# Patient Record
Sex: Female | Born: 1940 | Race: White | Hispanic: No | Smoking: Former smoker
Health system: Southern US, Community
[De-identification: ages and names within clinical notes are randomized; demographics above are authoritative.]

## PROBLEM LIST (undated history)

## (undated) DIAGNOSIS — M779 Enthesopathy, unspecified: Secondary | ICD-10-CM

## (undated) DIAGNOSIS — Z8674 Personal history of sudden cardiac arrest: Secondary | ICD-10-CM

## (undated) DIAGNOSIS — IMO0002 Reserved for concepts with insufficient information to code with codable children: Secondary | ICD-10-CM

## (undated) DIAGNOSIS — T4145XA Adverse effect of unspecified anesthetic, initial encounter: Secondary | ICD-10-CM

## (undated) DIAGNOSIS — I2510 Atherosclerotic heart disease of native coronary artery without angina pectoris: Secondary | ICD-10-CM

## (undated) DIAGNOSIS — J40 Bronchitis, not specified as acute or chronic: Secondary | ICD-10-CM

## (undated) DIAGNOSIS — Z923 Personal history of irradiation: Secondary | ICD-10-CM

## (undated) DIAGNOSIS — C50919 Malignant neoplasm of unspecified site of unspecified female breast: Secondary | ICD-10-CM

## (undated) DIAGNOSIS — T8859XA Other complications of anesthesia, initial encounter: Secondary | ICD-10-CM

## (undated) DIAGNOSIS — I219 Acute myocardial infarction, unspecified: Secondary | ICD-10-CM

## (undated) DIAGNOSIS — R911 Solitary pulmonary nodule: Secondary | ICD-10-CM

## (undated) DIAGNOSIS — M549 Dorsalgia, unspecified: Secondary | ICD-10-CM

## (undated) DIAGNOSIS — I10 Essential (primary) hypertension: Secondary | ICD-10-CM

## (undated) DIAGNOSIS — H269 Unspecified cataract: Secondary | ICD-10-CM

## (undated) DIAGNOSIS — M1990 Unspecified osteoarthritis, unspecified site: Secondary | ICD-10-CM

## (undated) DIAGNOSIS — L719 Rosacea, unspecified: Secondary | ICD-10-CM

## (undated) DIAGNOSIS — R0600 Dyspnea, unspecified: Secondary | ICD-10-CM

## (undated) DIAGNOSIS — F419 Anxiety disorder, unspecified: Secondary | ICD-10-CM

## (undated) DIAGNOSIS — R0609 Other forms of dyspnea: Secondary | ICD-10-CM

## (undated) DIAGNOSIS — IMO0001 Reserved for inherently not codable concepts without codable children: Secondary | ICD-10-CM

## (undated) DIAGNOSIS — E785 Hyperlipidemia, unspecified: Secondary | ICD-10-CM

## (undated) HISTORY — DX: Hyperlipidemia, unspecified: E78.5

## (undated) HISTORY — DX: Reserved for inherently not codable concepts without codable children: IMO0001

## (undated) HISTORY — PX: EYE SURGERY: SHX253

## (undated) HISTORY — PX: TONSILLECTOMY AND ADENOIDECTOMY: SUR1326

## (undated) HISTORY — PX: CATARACT EXTRACTION: SUR2

## (undated) HISTORY — PX: OTHER SURGICAL HISTORY: SHX169

## (undated) HISTORY — PX: SPINAL FUSION: SHX223

## (undated) HISTORY — DX: Malignant neoplasm of unspecified site of unspecified female breast: C50.919

## (undated) HISTORY — DX: Enthesopathy, unspecified: M77.9

## (undated) HISTORY — PX: BACK SURGERY: SHX140

## (undated) HISTORY — DX: Atherosclerotic heart disease of native coronary artery without angina pectoris: I25.10

## (undated) HISTORY — DX: Personal history of sudden cardiac arrest: Z86.74

## (undated) HISTORY — PX: TUBAL LIGATION: SHX77

## (undated) HISTORY — DX: Reserved for concepts with insufficient information to code with codable children: IMO0002

## (undated) HISTORY — DX: Acute myocardial infarction, unspecified: I21.9

## (undated) HISTORY — DX: Rosacea, unspecified: L71.9

## (undated) HISTORY — DX: Unspecified cataract: H26.9

---

## 1998-05-30 ENCOUNTER — Other Ambulatory Visit: Admission: RE | Admit: 1998-05-30 | Discharge: 1998-05-30 | Payer: Self-pay | Admitting: *Deleted

## 1998-09-28 ENCOUNTER — Other Ambulatory Visit: Admission: RE | Admit: 1998-09-28 | Discharge: 1998-09-28 | Payer: Self-pay | Admitting: *Deleted

## 1999-07-12 ENCOUNTER — Other Ambulatory Visit: Admission: RE | Admit: 1999-07-12 | Discharge: 1999-07-12 | Payer: Self-pay | Admitting: *Deleted

## 1999-12-29 ENCOUNTER — Encounter: Payer: Self-pay | Admitting: Specialist

## 2000-01-01 ENCOUNTER — Ambulatory Visit (HOSPITAL_COMMUNITY): Admission: RE | Admit: 2000-01-01 | Discharge: 2000-01-01 | Payer: Self-pay | Admitting: Specialist

## 2000-01-31 ENCOUNTER — Ambulatory Visit (HOSPITAL_COMMUNITY): Admission: RE | Admit: 2000-01-31 | Discharge: 2000-01-31 | Payer: Self-pay | Admitting: Specialist

## 2000-04-05 ENCOUNTER — Encounter: Admission: RE | Admit: 2000-04-05 | Discharge: 2000-04-05 | Payer: Self-pay | Admitting: *Deleted

## 2000-04-05 ENCOUNTER — Encounter: Payer: Self-pay | Admitting: *Deleted

## 2000-09-20 ENCOUNTER — Other Ambulatory Visit: Admission: RE | Admit: 2000-09-20 | Discharge: 2000-09-20 | Payer: Self-pay | Admitting: *Deleted

## 2001-09-24 ENCOUNTER — Encounter: Payer: Self-pay | Admitting: Internal Medicine

## 2001-09-24 ENCOUNTER — Encounter: Admission: RE | Admit: 2001-09-24 | Discharge: 2001-09-24 | Payer: Self-pay | Admitting: Internal Medicine

## 2001-12-08 ENCOUNTER — Other Ambulatory Visit: Admission: RE | Admit: 2001-12-08 | Discharge: 2001-12-08 | Payer: Self-pay | Admitting: *Deleted

## 2003-02-23 ENCOUNTER — Other Ambulatory Visit: Admission: RE | Admit: 2003-02-23 | Discharge: 2003-02-23 | Payer: Self-pay | Admitting: *Deleted

## 2003-07-20 ENCOUNTER — Encounter: Admission: RE | Admit: 2003-07-20 | Discharge: 2003-07-20 | Payer: Self-pay | Admitting: *Deleted

## 2004-03-29 ENCOUNTER — Other Ambulatory Visit: Admission: RE | Admit: 2004-03-29 | Discharge: 2004-03-29 | Payer: Self-pay | Admitting: *Deleted

## 2004-05-02 ENCOUNTER — Ambulatory Visit (HOSPITAL_COMMUNITY): Admission: RE | Admit: 2004-05-02 | Discharge: 2004-05-02 | Payer: Self-pay | Admitting: Cardiothoracic Surgery

## 2004-09-05 ENCOUNTER — Encounter: Admission: RE | Admit: 2004-09-05 | Discharge: 2004-09-05 | Payer: Self-pay | Admitting: *Deleted

## 2004-09-27 ENCOUNTER — Ambulatory Visit (HOSPITAL_COMMUNITY): Admission: RE | Admit: 2004-09-27 | Discharge: 2004-09-27 | Payer: Self-pay | Admitting: Cardiothoracic Surgery

## 2005-05-28 ENCOUNTER — Other Ambulatory Visit: Admission: RE | Admit: 2005-05-28 | Discharge: 2005-05-28 | Payer: Self-pay | Admitting: Obstetrics & Gynecology

## 2005-06-21 ENCOUNTER — Encounter: Admission: RE | Admit: 2005-06-21 | Discharge: 2005-06-21 | Payer: Self-pay | Admitting: Cardiothoracic Surgery

## 2005-09-11 ENCOUNTER — Encounter: Admission: RE | Admit: 2005-09-11 | Discharge: 2005-09-11 | Payer: Self-pay | Admitting: Internal Medicine

## 2006-02-26 DIAGNOSIS — L719 Rosacea, unspecified: Secondary | ICD-10-CM

## 2006-02-26 HISTORY — DX: Rosacea, unspecified: L71.9

## 2006-09-19 ENCOUNTER — Other Ambulatory Visit: Admission: RE | Admit: 2006-09-19 | Discharge: 2006-09-19 | Payer: Self-pay | Admitting: Obstetrics & Gynecology

## 2007-01-16 ENCOUNTER — Encounter: Admission: RE | Admit: 2007-01-16 | Discharge: 2007-01-16 | Payer: Self-pay | Admitting: Internal Medicine

## 2007-01-21 ENCOUNTER — Encounter: Admission: RE | Admit: 2007-01-21 | Discharge: 2007-01-21 | Payer: Self-pay | Admitting: Internal Medicine

## 2007-02-04 ENCOUNTER — Ambulatory Visit: Payer: Self-pay | Admitting: Internal Medicine

## 2007-02-12 ENCOUNTER — Ambulatory Visit: Payer: Self-pay | Admitting: Internal Medicine

## 2007-02-27 DIAGNOSIS — M779 Enthesopathy, unspecified: Secondary | ICD-10-CM

## 2007-02-27 HISTORY — DX: Enthesopathy, unspecified: M77.9

## 2007-03-19 ENCOUNTER — Encounter: Admission: RE | Admit: 2007-03-19 | Discharge: 2007-03-19 | Payer: Self-pay | Admitting: Internal Medicine

## 2008-02-23 ENCOUNTER — Encounter: Admission: RE | Admit: 2008-02-23 | Discharge: 2008-02-23 | Payer: Self-pay | Admitting: Internal Medicine

## 2008-03-04 ENCOUNTER — Encounter: Admission: RE | Admit: 2008-03-04 | Discharge: 2008-03-04 | Payer: Self-pay | Admitting: Internal Medicine

## 2008-09-26 HISTORY — PX: BREAST BIOPSY: SHX20

## 2008-10-25 ENCOUNTER — Encounter: Admission: RE | Admit: 2008-10-25 | Discharge: 2008-10-25 | Payer: Self-pay | Admitting: Internal Medicine

## 2008-10-27 HISTORY — PX: HYSTEROSCOPY: SHX211

## 2008-11-02 ENCOUNTER — Ambulatory Visit (HOSPITAL_BASED_OUTPATIENT_CLINIC_OR_DEPARTMENT_OTHER): Admission: RE | Admit: 2008-11-02 | Discharge: 2008-11-02 | Payer: Self-pay | Admitting: Obstetrics & Gynecology

## 2008-11-02 ENCOUNTER — Encounter: Payer: Self-pay | Admitting: Obstetrics & Gynecology

## 2009-04-29 ENCOUNTER — Encounter: Admission: RE | Admit: 2009-04-29 | Discharge: 2009-04-29 | Payer: Self-pay | Admitting: Internal Medicine

## 2009-10-23 ENCOUNTER — Emergency Department (HOSPITAL_COMMUNITY): Admission: EM | Admit: 2009-10-23 | Discharge: 2009-10-23 | Payer: Self-pay | Admitting: Emergency Medicine

## 2010-03-19 ENCOUNTER — Encounter: Payer: Self-pay | Admitting: Internal Medicine

## 2010-04-21 ENCOUNTER — Other Ambulatory Visit: Payer: Self-pay | Admitting: Obstetrics & Gynecology

## 2010-04-21 DIAGNOSIS — Z1231 Encounter for screening mammogram for malignant neoplasm of breast: Secondary | ICD-10-CM

## 2010-05-30 ENCOUNTER — Ambulatory Visit
Admission: RE | Admit: 2010-05-30 | Discharge: 2010-05-30 | Disposition: A | Discharge status: Home / Self Care | Payer: Medicare Other | Source: Ambulatory Visit | Attending: Obstetrics & Gynecology | Admitting: Obstetrics & Gynecology

## 2010-05-30 DIAGNOSIS — Z1231 Encounter for screening mammogram for malignant neoplasm of breast: Secondary | ICD-10-CM

## 2010-06-02 LAB — BASIC METABOLIC PANEL
BUN: 15 mg/dL (ref 6–23)
CO2: 31 mEq/L (ref 19–32)
Calcium: 9.3 mg/dL (ref 8.4–10.5)
GFR calc Af Amer: >60 mL/min (ref >60)
GFR calc non Af Amer: >60 mL/min (ref >60)
Sodium: 139 mEq/L (ref 135–145)

## 2010-06-02 LAB — CBC
HCT: 40.7 % (ref 36.0–46.0)
MCV: 87.2 fL (ref 78.0–100.0)
RBC: 4.67 MIL/uL (ref 3.87–5.11)
RDW: 13.7 % (ref 11.5–15.5)

## 2010-06-02 LAB — URINALYSIS, ROUTINE W REFLEX MICROSCOPIC
Bilirubin Urine: NEGATIVE
Glucose, UA: NEGATIVE mg/dL

## 2010-06-02 LAB — URINE MICROSCOPIC-ADD ON

## 2010-12-28 DIAGNOSIS — Z8674 Personal history of sudden cardiac arrest: Secondary | ICD-10-CM

## 2010-12-28 DIAGNOSIS — I219 Acute myocardial infarction, unspecified: Secondary | ICD-10-CM

## 2010-12-28 HISTORY — DX: Acute myocardial infarction, unspecified: I21.9

## 2010-12-28 HISTORY — DX: Personal history of sudden cardiac arrest: Z86.74

## 2011-01-18 ENCOUNTER — Inpatient Hospital Stay (HOSPITAL_COMMUNITY): Payer: Medicare Other

## 2011-01-18 ENCOUNTER — Inpatient Hospital Stay (HOSPITAL_COMMUNITY)
Admission: EM | Admit: 2011-01-18 | Discharge: 2011-01-31 | DRG: 250 | Disposition: A | Discharge status: Skilled Nursing Facility | Payer: Medicare Other | Source: Ambulatory Visit | Attending: Cardiology | Admitting: Cardiology

## 2011-01-18 ENCOUNTER — Emergency Department (HOSPITAL_COMMUNITY): Payer: Medicare Other

## 2011-01-18 ENCOUNTER — Encounter: Payer: Self-pay | Admitting: *Deleted

## 2011-01-18 ENCOUNTER — Other Ambulatory Visit: Payer: Self-pay

## 2011-01-18 ENCOUNTER — Encounter (HOSPITAL_COMMUNITY)
Admission: EM | Disposition: A | Discharge status: Skilled Nursing Facility | Payer: Self-pay | Source: Ambulatory Visit | Attending: Cardiology

## 2011-01-18 ENCOUNTER — Ambulatory Visit (HOSPITAL_COMMUNITY): Admit: 2011-01-18 | Payer: Self-pay | Admitting: Cardiology

## 2011-01-18 DIAGNOSIS — I519 Heart disease, unspecified: Secondary | ICD-10-CM | POA: Diagnosis not present

## 2011-01-18 DIAGNOSIS — F411 Generalized anxiety disorder: Secondary | ICD-10-CM | POA: Diagnosis present

## 2011-01-18 DIAGNOSIS — R0902 Hypoxemia: Secondary | ICD-10-CM

## 2011-01-18 DIAGNOSIS — I2510 Atherosclerotic heart disease of native coronary artery without angina pectoris: Secondary | ICD-10-CM | POA: Diagnosis present

## 2011-01-18 DIAGNOSIS — Y840 Cardiac catheterization as the cause of abnormal reaction of the patient, or of later complication, without mention of misadventure at the time of the procedure: Secondary | ICD-10-CM | POA: Diagnosis not present

## 2011-01-18 DIAGNOSIS — Z87891 Personal history of nicotine dependence: Secondary | ICD-10-CM

## 2011-01-18 DIAGNOSIS — E872 Acidosis, unspecified: Secondary | ICD-10-CM | POA: Diagnosis present

## 2011-01-18 DIAGNOSIS — J9600 Acute respiratory failure, unspecified whether with hypoxia or hypercapnia: Secondary | ICD-10-CM

## 2011-01-18 DIAGNOSIS — I959 Hypotension, unspecified: Secondary | ICD-10-CM

## 2011-01-18 DIAGNOSIS — R680 Hypothermia, not associated with low environmental temperature: Secondary | ICD-10-CM | POA: Diagnosis present

## 2011-01-18 DIAGNOSIS — I213 ST elevation (STEMI) myocardial infarction of unspecified site: Secondary | ICD-10-CM

## 2011-01-18 DIAGNOSIS — E7800 Pure hypercholesterolemia, unspecified: Secondary | ICD-10-CM | POA: Diagnosis present

## 2011-01-18 DIAGNOSIS — I2119 ST elevation (STEMI) myocardial infarction involving other coronary artery of inferior wall: Principal | ICD-10-CM | POA: Diagnosis present

## 2011-01-18 DIAGNOSIS — R5381 Other malaise: Secondary | ICD-10-CM | POA: Diagnosis present

## 2011-01-18 DIAGNOSIS — Z23 Encounter for immunization: Secondary | ICD-10-CM

## 2011-01-18 DIAGNOSIS — E876 Hypokalemia: Secondary | ICD-10-CM | POA: Diagnosis not present

## 2011-01-18 DIAGNOSIS — I499 Cardiac arrhythmia, unspecified: Secondary | ICD-10-CM | POA: Diagnosis present

## 2011-01-18 DIAGNOSIS — I10 Essential (primary) hypertension: Secondary | ICD-10-CM | POA: Diagnosis present

## 2011-01-18 DIAGNOSIS — E119 Type 2 diabetes mellitus without complications: Secondary | ICD-10-CM | POA: Diagnosis present

## 2011-01-18 DIAGNOSIS — R570 Cardiogenic shock: Secondary | ICD-10-CM | POA: Diagnosis present

## 2011-01-18 DIAGNOSIS — I2542 Coronary artery dissection: Secondary | ICD-10-CM | POA: Diagnosis not present

## 2011-01-18 DIAGNOSIS — G931 Anoxic brain damage, not elsewhere classified: Secondary | ICD-10-CM | POA: Diagnosis present

## 2011-01-18 DIAGNOSIS — I2582 Chronic total occlusion of coronary artery: Secondary | ICD-10-CM | POA: Diagnosis present

## 2011-01-18 DIAGNOSIS — J449 Chronic obstructive pulmonary disease, unspecified: Secondary | ICD-10-CM | POA: Diagnosis present

## 2011-01-18 DIAGNOSIS — M1990 Unspecified osteoarthritis, unspecified site: Secondary | ICD-10-CM | POA: Diagnosis present

## 2011-01-18 DIAGNOSIS — E8779 Other fluid overload: Secondary | ICD-10-CM | POA: Diagnosis not present

## 2011-01-18 DIAGNOSIS — I469 Cardiac arrest, cause unspecified: Secondary | ICD-10-CM | POA: Diagnosis present

## 2011-01-18 DIAGNOSIS — G8929 Other chronic pain: Secondary | ICD-10-CM | POA: Diagnosis present

## 2011-01-18 DIAGNOSIS — J4489 Other specified chronic obstructive pulmonary disease: Secondary | ICD-10-CM | POA: Diagnosis present

## 2011-01-18 DIAGNOSIS — Z7982 Long term (current) use of aspirin: Secondary | ICD-10-CM

## 2011-01-18 DIAGNOSIS — J40 Bronchitis, not specified as acute or chronic: Secondary | ICD-10-CM | POA: Diagnosis not present

## 2011-01-18 DIAGNOSIS — Y9210 Unspecified residential institution as the place of occurrence of the external cause: Secondary | ICD-10-CM | POA: Diagnosis not present

## 2011-01-18 DIAGNOSIS — J9819 Other pulmonary collapse: Secondary | ICD-10-CM | POA: Diagnosis not present

## 2011-01-18 DIAGNOSIS — M549 Dorsalgia, unspecified: Secondary | ICD-10-CM | POA: Diagnosis present

## 2011-01-18 DIAGNOSIS — Z79899 Other long term (current) drug therapy: Secondary | ICD-10-CM

## 2011-01-18 HISTORY — PX: LEFT HEART CATHETERIZATION WITH CORONARY ANGIOGRAM: SHX5451

## 2011-01-18 HISTORY — DX: Essential (primary) hypertension: I10

## 2011-01-18 HISTORY — DX: Unspecified osteoarthritis, unspecified site: M19.90

## 2011-01-18 HISTORY — DX: Dorsalgia, unspecified: M54.9

## 2011-01-18 HISTORY — PX: PERCUTANEOUS CORONARY STENT INTERVENTION (PCI-S): SHX5485

## 2011-01-18 HISTORY — DX: Anxiety disorder, unspecified: F41.9

## 2011-01-18 LAB — BASIC METABOLIC PANEL
BUN: 27 mg/dL — ABNORMAL HIGH (ref 6–23)
CO2: 16 mEq/L — ABNORMAL LOW (ref 19–32)
CO2: 13 mEq/L — ABNORMAL LOW (ref 19–32)
Chloride: 106 mEq/L (ref 96–112)
Chloride: 106 mEq/L (ref 96–112)
GFR calc non Af Amer: 88 mL/min — ABNORMAL LOW (ref >90)
Glucose, Bld: 321 mg/dL — ABNORMAL HIGH (ref 70–99)
Glucose, Bld: 370 mg/dL — ABNORMAL HIGH (ref 70–99)
Potassium: 2.8 mEq/L — ABNORMAL LOW (ref 3.5–5.1)
Potassium: 2.9 mEq/L — ABNORMAL LOW (ref 3.5–5.1)
Sodium: 137 mEq/L (ref 135–145)
Sodium: 137 mEq/L (ref 135–145)

## 2011-01-18 LAB — POCT I-STAT, CHEM 8
BUN: 34 mg/dL — ABNORMAL HIGH (ref 6–23)
Calcium, Ion: 1.01 mmol/L — ABNORMAL LOW (ref 1.12–1.32)
Chloride: 102 mEq/L (ref 96–112)
HCT: 37 % (ref 36.0–46.0)
Potassium: 2.8 mEq/L — ABNORMAL LOW (ref 3.5–5.1)

## 2011-01-18 LAB — URINALYSIS, ROUTINE W REFLEX MICROSCOPIC
Bilirubin Urine: NEGATIVE
Glucose, UA: 500 mg/dL — AB
Ketones, ur: 15 mg/dL — AB
Leukocytes, UA: NEGATIVE
pH: 5 (ref 5.0–8.0)

## 2011-01-18 LAB — CBC
HCT: 38.1 % (ref 36.0–46.0)
MCV: 86.2 fL (ref 78.0–100.0)
RBC: 4.42 MIL/uL (ref 3.87–5.11)
WBC: 13.5 10*3/uL — ABNORMAL HIGH (ref 4.0–10.5)

## 2011-01-18 LAB — GLUCOSE, CAPILLARY
Glucose-Capillary: 172 mg/dL — ABNORMAL HIGH (ref 70–99)
Glucose-Capillary: 290 mg/dL — ABNORMAL HIGH (ref 70–99)

## 2011-01-18 LAB — DIFFERENTIAL
Eosinophils Relative: 2 % (ref 0–5)
Lymphocytes Relative: 31 % (ref 12–46)
Lymphs Abs: 4.2 10*3/uL — ABNORMAL HIGH (ref 0.7–4.0)
Monocytes Absolute: 1 10*3/uL (ref 0.1–1.0)
Monocytes Relative: 7 % (ref 3–12)

## 2011-01-18 LAB — CARDIAC PANEL(CRET KIN+CKTOT+MB+TROPI)
Relative Index: 8.8 — ABNORMAL HIGH (ref 0.0–2.5)
Total CK: 183 U/L — ABNORMAL HIGH (ref 7–177)

## 2011-01-18 LAB — BLOOD GAS, ARTERIAL
MECHVT: 600 mL
PEEP: 5 cmH2O
Patient temperature: 98.6
pH, Arterial: 7.316 — ABNORMAL LOW (ref 7.350–7.400)

## 2011-01-18 LAB — COMPREHENSIVE METABOLIC PANEL
ALT: 156 U/L — ABNORMAL HIGH (ref 0–35)
AST: 142 U/L — ABNORMAL HIGH (ref 0–37)
CO2: 21 mEq/L (ref 19–32)
Calcium: 8.4 mg/dL (ref 8.4–10.5)
Chloride: 99 mEq/L (ref 96–112)
GFR calc non Af Amer: 38 mL/min — ABNORMAL LOW (ref >90)
Sodium: 137 mEq/L (ref 135–145)

## 2011-01-18 LAB — LIPID PANEL
Cholesterol: 128 mg/dL (ref 0–200)
HDL: 47 mg/dL (ref >39)
Triglycerides: 103 mg/dL (ref <150)

## 2011-01-18 LAB — POCT I-STAT TROPONIN I: Troponin i, poc: 4.34 ng/mL (ref 0.00–0.08)

## 2011-01-18 LAB — MRSA PCR SCREENING: MRSA by PCR: NEGATIVE

## 2011-01-18 LAB — APTT: aPTT: 35 seconds (ref 24–37)

## 2011-01-18 SURGERY — LEFT HEART CATHETERIZATION WITH CORONARY ANGIOGRAM
Anesthesia: LOCAL

## 2011-01-18 MED ORDER — CISATRACURIUM BESYLATE 2 MG/ML IV SOLN: 0.1000 mg/kg | Freq: Once | INTRAVENOUS | Status: DC | PRN

## 2011-01-18 MED ORDER — BIVALIRUDIN 250 MG IV SOLR
INTRAVENOUS | Status: AC
  Filled 2011-01-18: qty 250

## 2011-01-18 MED ORDER — DEXTROSE 5 % IV SOLN
60.0000 mg/h | INTRAVENOUS | Status: AC
  Administered 2011-01-18: 60 mg/h via INTRAVENOUS
  Filled 2011-01-18: qty 9

## 2011-01-18 MED ORDER — FENTANYL BOLUS VIA INFUSION
50.0000 ug | INTRAVENOUS | Status: DC | PRN
  Administered 2011-01-20: 50 ug via INTRAVENOUS
  Filled 2011-01-18: qty 50

## 2011-01-18 MED ORDER — NOREPINEPHRINE BITARTRATE 1 MG/ML IJ SOLN: 0.5000 ug/min | INTRAVENOUS | Status: DC

## 2011-01-18 MED ORDER — DEXTROSE 5 % IV SOLN
0.5000 ug/min | INTRAVENOUS | Status: DC
  Filled 2011-01-18 (×2): qty 4

## 2011-01-18 MED ORDER — POTASSIUM CHLORIDE 10 MEQ/50ML IV SOLN: 10.0000 meq | INTRAVENOUS | Status: DC

## 2011-01-18 MED ORDER — FENTANYL CITRATE 0.05 MG/ML IJ SOLN
100.0000 ug | Freq: Once | INTRAMUSCULAR | Status: DC
  Filled 2011-01-18: qty 2

## 2011-01-18 MED ORDER — SODIUM CHLORIDE 0.9 % IV BOLUS (SEPSIS)
1000.0000 mL | Freq: Once | INTRAVENOUS | Status: AC
  Administered 2011-01-18: 1000 mL via INTRAVENOUS

## 2011-01-18 MED ORDER — ROSUVASTATIN CALCIUM 20 MG PO TABS
20.0000 mg | ORAL_TABLET | Freq: Every day | ORAL | Status: DC
  Filled 2011-01-18 (×4): qty 1

## 2011-01-18 MED ORDER — DEXTROSE 5 % IV SOLN
150.0000 mg | Freq: Once | INTRAVENOUS | Status: AC
  Administered 2011-01-18: 150 mg via INTRAVENOUS
  Filled 2011-01-18: qty 3

## 2011-01-18 MED ORDER — FENTANYL BOLUS VIA INFUSION: 50.0000 ug | INTRAVENOUS | Status: DC | PRN

## 2011-01-18 MED ORDER — BIOTENE DRY MOUTH MT LIQD
15.0000 mL | Freq: Four times a day (QID) | OROMUCOSAL | Status: DC
  Administered 2011-01-19 – 2011-01-22 (×14): 15 mL via OROMUCOSAL

## 2011-01-18 MED ORDER — AMIODARONE LOAD VIA INFUSION: 150.0000 mg | Freq: Once | INTRAVENOUS | Status: DC

## 2011-01-18 MED ORDER — MIDAZOLAM HCL 5 MG/ML IJ SOLN: 2.0000 mg | Freq: Once | INTRAMUSCULAR | Status: DC | PRN

## 2011-01-18 MED ORDER — MIDAZOLAM BOLUS VIA INFUSION
2.0000 mg | INTRAVENOUS | Status: DC | PRN
  Administered 2011-01-20: 2 mg via INTRAVENOUS
  Filled 2011-01-18 (×2): qty 2

## 2011-01-18 MED ORDER — POTASSIUM CHLORIDE 10 MEQ/50ML IV SOLN
10.0000 meq | INTRAVENOUS | Status: AC
  Administered 2011-01-18 – 2011-01-19 (×4): 10 meq via INTRAVENOUS
  Filled 2011-01-18 (×4): qty 50

## 2011-01-18 MED ORDER — LIDOCAINE HCL (PF) 1 % IJ SOLN
INTRAMUSCULAR | Status: AC
  Filled 2011-01-18: qty 30

## 2011-01-18 MED ORDER — SODIUM CHLORIDE 0.9 % IV SOLN
1.0000 mg/h | INTRAVENOUS | Status: DC
  Administered 2011-01-18 – 2011-01-19 (×2): 2 mg/h via INTRAVENOUS
  Administered 2011-01-20: 4 mg/h via INTRAVENOUS
  Filled 2011-01-18 (×3): qty 10

## 2011-01-18 MED ORDER — DEXTROSE 5 % IV SOLN
30.0000 mg/h | INTRAVENOUS | Status: DC
  Administered 2011-01-19: 15 mg/h via INTRAVENOUS
  Filled 2011-01-18 (×2): qty 9

## 2011-01-18 MED ORDER — SODIUM CHLORIDE 0.9 % IV SOLN
INTRAVENOUS | Status: DC
  Administered 2011-01-18: 1.9 [IU]/h via INTRAVENOUS
  Administered 2011-01-19: 4 [IU]/h via INTRAVENOUS
  Administered 2011-01-19: 13:00:00 via INTRAVENOUS
  Administered 2011-01-19: 25.4 [IU]/h via INTRAVENOUS
  Administered 2011-01-19: 38.7 [IU]/h via INTRAVENOUS
  Administered 2011-01-19 (×2): via INTRAVENOUS
  Filled 2011-01-18 (×7): qty 1

## 2011-01-18 MED ORDER — ASPIRIN 300 MG RE SUPP
300.0000 mg | Freq: Every day | RECTAL | Status: DC
  Administered 2011-01-19 – 2011-01-21 (×3): 300 mg via RECTAL
  Filled 2011-01-18 (×4): qty 1

## 2011-01-18 MED ORDER — ONDANSETRON HCL 4 MG/2ML IJ SOLN: 4.0000 mg | Freq: Four times a day (QID) | INTRAMUSCULAR | Status: DC | PRN

## 2011-01-18 MED ORDER — CARVEDILOL 3.125 MG PO TABS
3.1250 mg | ORAL_TABLET | Freq: Two times a day (BID) | ORAL | Status: DC
  Filled 2011-01-18 (×5): qty 1

## 2011-01-18 MED ORDER — METOPROLOL TARTRATE 1 MG/ML IV SOLN
INTRAVENOUS | Status: AC
  Filled 2011-01-18: qty 5

## 2011-01-18 MED ORDER — DOPAMINE-DEXTROSE 3.2-5 MG/ML-% IV SOLN
2.0000 ug/kg/min | INTRAVENOUS | Status: DC
  Administered 2011-01-20: 5 ug/kg/min via INTRAVENOUS
  Filled 2011-01-18: qty 250

## 2011-01-18 MED ORDER — SODIUM CHLORIDE 0.9 % IV SOLN
250.0000 mL | INTRAVENOUS | Status: DC | PRN
  Administered 2011-01-19: 1000 mL via INTRAVENOUS
  Administered 2011-01-20: 21:00:00 via INTRAVENOUS

## 2011-01-18 MED ORDER — ACETAMINOPHEN 325 MG PO TABS
650.0000 mg | ORAL_TABLET | ORAL | Status: DC | PRN
  Administered 2011-01-22 – 2011-01-23 (×2): 650 mg via ORAL
  Filled 2011-01-18 (×2): qty 2

## 2011-01-18 MED ORDER — VASOPRESSIN 20 UNIT/ML IJ SOLN
0.0300 [IU]/min | INTRAVENOUS | Status: DC
  Administered 2011-01-18: 0.03 [IU]/min via INTRAVENOUS
  Filled 2011-01-18: qty 2.5

## 2011-01-18 MED ORDER — ARTIFICIAL TEARS OP OINT
1.0000 "application " | TOPICAL_OINTMENT | Freq: Three times a day (TID) | OPHTHALMIC | Status: DC
  Administered 2011-01-18 – 2011-01-19 (×4): 1 via OPHTHALMIC
  Filled 2011-01-18 (×2): qty 3.5

## 2011-01-18 MED ORDER — CALCIUM GLUCONATE 10 % IV SOLN
1.0000 g | Freq: Once | INTRAVENOUS | Status: AC
  Administered 2011-01-18: 1 g via INTRAVENOUS
  Filled 2011-01-18: qty 10

## 2011-01-18 MED ORDER — SODIUM CHLORIDE 0.9 % IV SOLN: 1.0000 mg/h | INTRAVENOUS | Status: DC

## 2011-01-18 MED ORDER — SODIUM CHLORIDE 0.9 % IV SOLN
100.0000 ug/h | INTRAVENOUS | Status: DC
  Administered 2011-01-18: 50 ug/h via INTRAVENOUS
  Administered 2011-01-19: 100 ug/h via INTRAVENOUS
  Administered 2011-01-20: 50 ug/h via INTRAVENOUS
  Administered 2011-01-20: 25 ug/h via INTRAVENOUS
  Administered 2011-01-21: 100 ug/h via INTRAVENOUS
  Filled 2011-01-18 (×2): qty 50

## 2011-01-18 MED ORDER — SODIUM CHLORIDE 0.9 % IV BOLUS (SEPSIS)
750.0000 mL | Freq: Once | INTRAVENOUS | Status: AC
  Administered 2011-01-18: 750 mL via INTRAVENOUS

## 2011-01-18 MED ORDER — ASPIRIN 300 MG RE SUPP: 300.0000 mg | RECTAL | Status: DC

## 2011-01-18 MED ORDER — DOPAMINE-DEXTROSE 3.2-5 MG/ML-% IV SOLN
INTRAVENOUS | Status: AC
  Filled 2011-01-18: qty 250

## 2011-01-18 MED ORDER — NOREPINEPHRINE BITARTRATE 1 MG/ML IJ SOLN
2.0000 ug/min | INTRAVENOUS | Status: DC
  Administered 2011-01-19: 30 ug/min via INTRAVENOUS
  Administered 2011-01-19: 14 ug/min via INTRAVENOUS
  Administered 2011-01-20: 25 ug/min via INTRAVENOUS
  Administered 2011-01-20: 40 ug/min via INTRAVENOUS
  Administered 2011-01-21: 15 ug/min via INTRAVENOUS
  Filled 2011-01-18 (×6): qty 16

## 2011-01-18 MED ORDER — METOPROLOL TARTRATE 1 MG/ML IV SOLN: 2.5000 mg | INTRAVENOUS | Status: DC | PRN

## 2011-01-18 MED ORDER — METOPROLOL TARTRATE 1 MG/ML IV SOLN
2.5000 mg | INTRAVENOUS | Status: DC
  Administered 2011-01-18: 2.5 mg via INTRAVENOUS

## 2011-01-18 MED ORDER — PANTOPRAZOLE SODIUM 40 MG IV SOLR
40.0000 mg | Freq: Every day | INTRAVENOUS | Status: DC
  Administered 2011-01-18 – 2011-01-20 (×3): 40 mg via INTRAVENOUS
  Filled 2011-01-18 (×4): qty 40

## 2011-01-18 MED ORDER — FENTANYL CITRATE 0.05 MG/ML IJ SOLN: 100.0000 ug | Freq: Once | INTRAMUSCULAR | Status: DC

## 2011-01-18 MED ORDER — NITROGLYCERIN 0.2 MG/ML ON CALL CATH LAB
INTRAVENOUS | Status: AC
  Filled 2011-01-18: qty 1

## 2011-01-18 MED ORDER — FENTANYL CITRATE 0.05 MG/ML IJ SOLN: 100.0000 ug | Freq: Once | INTRAMUSCULAR | Status: DC | PRN

## 2011-01-18 MED ORDER — SODIUM CHLORIDE 0.9 % IV SOLN: 1.0000 ug/kg/min | INTRAVENOUS | Status: DC

## 2011-01-18 MED ORDER — POTASSIUM CHLORIDE 10 MEQ/100ML IV SOLN: 10.0000 meq | INTRAVENOUS | Status: DC

## 2011-01-18 MED ORDER — CHLORHEXIDINE GLUCONATE 0.12 % MT SOLN
15.0000 mL | Freq: Two times a day (BID) | OROMUCOSAL | Status: DC
  Administered 2011-01-19 – 2011-01-21 (×7): 15 mL via OROMUCOSAL
  Filled 2011-01-18 (×8): qty 15

## 2011-01-18 MED ORDER — AMIODARONE HCL IN DEXTROSE 360-4.14 MG/200ML-% IV SOLN
INTRAVENOUS | Status: AC
  Administered 2011-01-18: 200 mL
  Filled 2011-01-18: qty 200

## 2011-01-18 MED ORDER — SODIUM CHLORIDE 0.9 % IV SOLN
INTRAVENOUS | Status: DC
  Administered 2011-01-18: 20:00:00 via INTRAVENOUS

## 2011-01-18 MED ORDER — CISATRACURIUM BESYLATE 2 MG/ML IV SOLN: 0.1000 mg/kg | Freq: Once | INTRAVENOUS | Status: DC

## 2011-01-18 MED ORDER — MIDAZOLAM HCL 5 MG/ML IJ SOLN: 2.0000 mg | Freq: Once | INTRAMUSCULAR | Status: DC

## 2011-01-18 MED ORDER — ARTIFICIAL TEARS OP OINT: 1.0000 "application " | TOPICAL_OINTMENT | Freq: Three times a day (TID) | OPHTHALMIC | Status: DC

## 2011-01-18 MED ORDER — FENTANYL CITRATE 0.05 MG/ML IJ SOLN: 100.0000 ug | Freq: Once | INTRAMUSCULAR | Status: AC | PRN

## 2011-01-18 MED ORDER — SODIUM CHLORIDE 0.9 % IV SOLN: 25.0000 ug/h | INTRAVENOUS | Status: DC

## 2011-01-18 MED ORDER — HEPARIN (PORCINE) IN NACL 2-0.9 UNIT/ML-% IJ SOLN
INTRAMUSCULAR | Status: AC
  Filled 2011-01-18: qty 2000

## 2011-01-18 MED ORDER — SODIUM CHLORIDE 0.9 % IV SOLN
1.0000 ug/kg/min | INTRAVENOUS | Status: DC
  Administered 2011-01-18: 1 ug/kg/min via INTRAVENOUS
  Filled 2011-01-18 (×2): qty 20

## 2011-01-18 MED ORDER — CISATRACURIUM BOLUS VIA INFUSION
0.0500 mg/kg | Freq: Once | INTRAVENOUS | Status: AC | PRN
  Filled 2011-01-18: qty 100

## 2011-01-18 MED ORDER — DEXTROSE 10 % IV SOLN: INTRAVENOUS | Status: DC

## 2011-01-18 MED ORDER — SODIUM BICARBONATE 8.4 % IV SOLN
INTRAVENOUS | Status: AC
  Filled 2011-01-18: qty 100

## 2011-01-18 MED ORDER — PHENYLEPHRINE HCL 10 MG/ML IJ SOLN
30.0000 ug/min | INTRAVENOUS | Status: DC
  Administered 2011-01-18: 30 ug/min via INTRAVENOUS
  Administered 2011-01-19: 125 ug/min via INTRAVENOUS
  Administered 2011-01-19: 200 ug/min via INTRAVENOUS
  Filled 2011-01-18 (×4): qty 1

## 2011-01-18 MED ORDER — POTASSIUM CHLORIDE 10 MEQ/50ML IV SOLN
10.0000 meq | INTRAVENOUS | Status: DC
  Administered 2011-01-18: 10 meq via INTRAVENOUS
  Filled 2011-01-18 (×4): qty 50

## 2011-01-18 MED ORDER — MIDAZOLAM BOLUS VIA INFUSION: 2.0000 mg | INTRAVENOUS | Status: DC | PRN

## 2011-01-18 MED ORDER — NITROGLYCERIN 0.4 MG SL SUBL: 0.4000 mg | SUBLINGUAL_TABLET | SUBLINGUAL | Status: DC | PRN

## 2011-01-18 MED ORDER — ASPIRIN 300 MG RE SUPP
300.0000 mg | RECTAL | Status: AC
  Administered 2011-01-18: 300 mg via RECTAL
  Filled 2011-01-18: qty 1

## 2011-01-18 MED ORDER — ASPIRIN EC 81 MG PO TBEC: 81.0000 mg | DELAYED_RELEASE_TABLET | Freq: Every day | ORAL | Status: DC

## 2011-01-18 MED ORDER — INSULIN ASPART 100 UNIT/ML ~~LOC~~ SOLN
0.0000 [IU] | SUBCUTANEOUS | Status: DC
  Filled 2011-01-18: qty 3

## 2011-01-18 MED ORDER — CISATRACURIUM BOLUS VIA INFUSION
0.0500 mg/kg | Freq: Once | INTRAVENOUS | Status: AC | PRN
  Administered 2011-01-18: 3.8 mg via INTRAVENOUS
  Filled 2011-01-18: qty 100

## 2011-01-18 MED ORDER — ASPIRIN 81 MG PO CHEW: 324.0000 mg | CHEWABLE_TABLET | ORAL | Status: AC

## 2011-01-18 NOTE — Progress Notes (Signed)
eLink Physician-Brief Progress Note Patient Name: [name] DOB: 09-15-1940 MRN: [id number]  Date of Service  01/18/2011   HPI/Events of Note   Multiple ARTIC sun orders entered twice,  Need bmet assessment  eICU Interventions  I cleaned up active orders and eliminated the double order entry I ordered bmet    Intervention Category Intermediate Interventions: Diagnostic test evaluation Minor Interventions: Clinical assessment - ordering diagnostic tests  Shan Levans 01/18/2011, 8:52 PM

## 2011-01-18 NOTE — Progress Notes (Signed)
eLink Physician-Brief Progress Note Patient Name: Rachel Chase DOB: Oct 24, 1940 MRN: [id number]  Date of Service  01/18/2011   HPI/Events of Note   Pt with RV infarct   EF >50%   eICU Interventions  Give volume ,  Add neo drip   Intervention Category Major Interventions: Shock - evaluation and management Intermediate Interventions: Diagnostic test evaluation Minor Interventions: Clinical assessment - ordering diagnostic tests  Shan Levans 01/18/2011, 10:26 PM

## 2011-01-18 NOTE — Op Note (Signed)
PCI report dictated on 01/18/1999 while the dictation number is [phone]

## 2011-01-18 NOTE — Progress Notes (Signed)
eLink Physician-Brief Progress Note Patient Name: Rachel Chase DOB: 1940/11/04 MRN: [id number]  Date of Service  01/18/2011   HPI/Events of Note   Ca low.  Pt in PAF.  eICU Interventions  See lopressor order, see order for Calcium IV   Intervention Category Major Interventions: Electrolyte abnormality - evaluation and management Intermediate Interventions: Diagnostic test evaluation Minor Interventions: Clinical assessment - ordering diagnostic tests  Shan Levans 01/18/2011, 10:01 PM

## 2011-01-18 NOTE — ED Notes (Signed)
Son states that pt was complaining of tightness in chest 2-3 days ago. Pt placed in c-collar.

## 2011-01-18 NOTE — ED Provider Notes (Signed)
History     CSN: [id number] Arrival date & time: 01/18/2011  4:11 PM   First MD Initiated Contact with Patient 01/18/11 1621      Chief Complaint  Patient presents with  . Cardiac Arrest   patient is a level V caveat due to intubated status.  (Consider location/radiation/quality/duration/timing/severity/associated sxs/prior treatment) The history is provided by the patient, the EMS personnel and a relative. The history is limited by the condition of the patient.   patient is a 70 year old female was in the kitchen and had a witnessed collapse and cardiac arrest. The rest was about 45 minutes prior to arrival to the ER. The son started CPR after calling 911. EMS arrived had V. fib and was defibrillated and regained pulses. She's intubated with a 70 ET tube. She's had spontaneous respirations but no other neurologic activity. She didn't by a line placed in the left tibia. Her EKG prehospital showed an inferior ST elevation MI.  No past medical history on file.  No past surgical history on file.  No family history on file.  History  Substance Use Topics  . Smoking status: Not on file  . Smokeless tobacco: Not on file  . Alcohol Use: Not on file    OB History    No data available      Review of Systems  Unable to perform ROS: Intubated    Allergies  Review of patient's allergies indicates no known allergies.  Home Medications  No current outpatient prescriptions on file.  BP 82/50  Pulse 90  Wt 165 lb 5.5 oz (75 kg)  Physical Exam  Vitals reviewed. Constitutional: She appears well-developed and well-nourished.  HENT:  Head: Normocephalic.  Eyes:       Pupils about 2-3 mm and minimally reactive.  Neck:       Cervical immobilization by EMS. No step-off or deformity. Cervical collar was placed in the ER. Trachea was midline.  Cardiovascular: Normal rate.   Pulmonary/Chest:       Intubated with equal breath sounds bilaterally.  Abdominal: Soft. She exhibits no  distension.  Musculoskeletal:       I0 And left proximal tibia.  Neurological:       Patient is breathing spontaneously, but comatose and intubated. No response to pain.  Skin: Skin is warm.    ED Course  Procedures (including critical care time)  Labs Reviewed  CBC - Abnormal; Notable for the following:    WBC 13.5 (*)    All other components within normal limits  DIFFERENTIAL - Abnormal; Notable for the following:    Neutro Abs 8.1 (*)    Lymphs Abs 4.2 (*)    All other components within normal limits  POCT I-STAT, CHEM 8 - Abnormal; Notable for the following:    Potassium 2.8 (*)    BUN 34 (*)    Creatinine, Ser 1.50 (*)    Glucose, Bld 197 (*)    Calcium, Ion 1.01 (*)    All other components within normal limits  POCT I-STAT TROPONIN I - Abnormal; Notable for the following:    Troponin i, poc 4.34 (*)    All other components within normal limits  PROTIME-INR  APTT  I-STAT, CHEM 8  URINALYSIS, ROUTINE W REFLEX MICROSCOPIC  COMPREHENSIVE METABOLIC PANEL  I-STAT TROPONIN I  BLOOD GAS, ARTERIAL  BASIC METABOLIC PANEL  BASIC METABOLIC PANEL  BASIC METABOLIC PANEL  BASIC METABOLIC PANEL  BASIC METABOLIC PANEL  BASIC METABOLIC PANEL  POCT CBG MONITORING  PROTIME-INR  PROTIME-INR  APTT  APTT   Ct Head Wo Contrast  01/18/2011  *RADIOLOGY REPORT*  Clinical Data:  Cardiac arrest, syncope, fell, question head injury  CT HEAD WITHOUT CONTRAST CT CERVICAL SPINE WITHOUT CONTRAST  Technique:  Multidetector CT imaging of the head and cervical spine was performed following the standard protocol without intravenous contrast.  Multiplanar CT image reconstructions of the cervical spine were also generated.  Comparison:  None  CT HEAD  Findings: Generalized atrophy. Normal ventricular morphology. No midline shift or mass effect. Minimal small vessel chronic ischemic changes of deep cerebral white matter. No intracranial hemorrhage, mass lesion, or acute infarction. Visualized  paranasal sinuses and mastoid air cells clear. Bones unremarkable.  IMPRESSION: Minimal small vessel chronic ischemic changes of deep cerebral white matter. No acute intracranial abnormalities.  CT CERVICAL SPINE  Findings: Disc space narrowing and endplate spur formation C5-C6, C 67, V 71. Bones appear slightly demineralized. Prevertebral soft tissues normal thickness. Vertebral body heights maintained without fracture or subluxation. Visualized skull base intact. Scattered atherosclerotic calcifications. Endotracheal tube noted. Lung apices clear.  IMPRESSION: Degenerative disc disease changes of the cervical spine as above. No acute cervical spine abnormalities.  Original Report Authenticated By: Lollie Marrow, M.D.   Ct Cervical Spine Wo Contrast  01/18/2011  *RADIOLOGY REPORT*  Clinical Data:  Cardiac arrest, syncope, fell, question head injury  CT HEAD WITHOUT CONTRAST CT CERVICAL SPINE WITHOUT CONTRAST  Technique:  Multidetector CT imaging of the head and cervical spine was performed following the standard protocol without intravenous contrast.  Multiplanar CT image reconstructions of the cervical spine were also generated.  Comparison:  None  CT HEAD  Findings: Generalized atrophy. Normal ventricular morphology. No midline shift or mass effect. Minimal small vessel chronic ischemic changes of deep cerebral white matter. No intracranial hemorrhage, mass lesion, or acute infarction. Visualized paranasal sinuses and mastoid air cells clear. Bones unremarkable.  IMPRESSION: Minimal small vessel chronic ischemic changes of deep cerebral white matter. No acute intracranial abnormalities.  CT CERVICAL SPINE  Findings: Disc space narrowing and endplate spur formation C5-C6, C 67, V 71. Bones appear slightly demineralized. Prevertebral soft tissues normal thickness. Vertebral body heights maintained without fracture or subluxation. Visualized skull base intact. Scattered atherosclerotic calcifications.  Endotracheal tube noted. Lung apices clear.  IMPRESSION: Degenerative disc disease changes of the cervical spine as above. No acute cervical spine abnormalities.  Original Report Authenticated By: Lollie Marrow, M.D.     1. STEMI (ST elevation myocardial infarction)   2. Cardiac arrest     CRITICAL CARE Performed by: Billee Cashing   Total critical care time:40  Critical care time was exclusive of separately billable procedures and treating other patients.  Critical care was necessary to treat or prevent imminent or life-threatening deterioration.  Critical care was time spent personally by me on the following activities: development of treatment plan with patient and/or surrogate as well as nursing, discussions with consultants, evaluation of patient's response to treatment, examination of patient, obtaining history from patient or surrogate, ordering and performing treatments and interventions, ordering and review of laboratory studies, ordering and review of radiographic studies, pulse oximetry and re-evaluation of patient's condition.    Date: 01/18/2011  Rate: 88  Rhythm: normal sinus rhythm  QRS Axis: normal  Intervals: normal  ST/T Wave abnormalities: ST elevations inferiorly  Conduction Disutrbances:none  Narrative Interpretation:   Old EKG Reviewed: none available   MDM  Patient came to the ER as a prehospital cardiac arrest  with return of vitals. Initial rhythm was V. fib. Defibrillated by EMS. Intubated by EMS. Prehospital EKG showed an ST elevation MI. Once demographics were obtained of the stomach was called. The patient was taken to the ER initially do to availability of cath lab and evaluation with the intubated status. Said she did hit her head was done of the need anticoagulation for the stemi, head CT and cervical spine CT were done. There is no bleed on those. Patient had fallen and hit her head on the floor and remained unresponsive. I felt like she could not  go directly to cath lab until we knew if there was intracranial hemorrhage. I discussed the case with Dr. Sharyn Lull the cardiologist immediately both before and after arrival. I also discussed the case with Dr. Molli Knock, from pulmonary critical care medicine. The hypothermia protocol was also started in the ER. She has no previous cardiac history except hypertension. She reportedly had been complaining of some chest pain the last couple days. Her troponin came back at 4. Patient had good breath sounds bilaterally and had a good pulse ox. She had good color change on end-tidal CO2 and continuous CO2 100 by EMS. I believe the ET tube was in good place. Did not want to delay in ER anymore to get the x-ray. Dr. Molli Knock put in a central line also. Position of both the line and the tube will be checked in cath lab.   Juliet Rude. Rubin Payor, MD 01/18/11 [id number]

## 2011-01-18 NOTE — ED Notes (Signed)
Pt arrived via EMS, intubated with dr pickering at bedside and RT. Pt immediately placed on monitor. No purposeful movement but does flinch to pain. Pt initially breathing over vent. 2mm and minimally responsive. No evidence of trauma to back of head but will scan due to falling backwards.

## 2011-01-18 NOTE — Op Note (Signed)
NAMEISADORE, BOKHARI NO.:  [id number]  MEDICAL RECORD NO.:  [id number]  LOCATION:  2901                         FACILITY:  [facility]  PHYSICIAN:  Eduardo Osier. Sharyn Lull, M.D. DATE OF BIRTH:  07/03/1940  DATE OF PROCEDURE:  01/18/2011 DATE OF DISCHARGE:                              OPERATIVE REPORT   PROCEDURES: 1. Left cardiac cath with selective left and right coronary     angiography, LV graphy via right groin using Judkins technique. 2. Insertion of temporary transvenous pacemaker via right femoral     venous approach up to RV apex. 3. Attempted PTCA to 100% occluded RCA without success as the flow     could not be established.  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  INDICATION FOR THE PROCEDURE:  Ms. Rhyner is a 70 year old female with past medical history significant for hypertension, degenerative joint disease, remote tobacco abuse, came to ER following cardiac arrest at home.  As per the patient, the patient was complaining of prolonged chest pain, approximately lasting 3 hours 4 days ago on Sunday night associated with shortness of breath.  The patient did not seek any medical attention then and today while in the kitchen suddenly collapsed and hit the back of the head to the floor and became unconscious.  Son called EMS and started CPR and the patient was noted to be in VFib arrest requiring defibrillation and then intubation in the ED.  The patient was noted to be hypotensive with blood pressure in the 80s systolic.  The patient had CT of the brain in the ED, which showed no evidence of acute bleed.  EKG done in the ER showed ST-elevation in lead 2, 3, aVF, and reciprocal changes in lead 1 and aVF, suggestive of acute inferior wall MI.  Discussed with patient's son at length regarding emergency cath, possible PTCA stenting, its risks and benefits and consented for the procedure.  After obtaining the informed  consent, the patient was brought to the cath lab and was placed on fluoroscopy table. Right groin was prepped and draped in the usual fashion.  A 2% Xylocaine was used for local anesthesia in the right groin.  With the help of thin wall needle, 6-French arterial and venous sheaths were placed.  Both the sheaths were aspirated and flushed.  Next, a 6-French left Judkins catheter was advanced over the wire under fluoroscopic guidance up to the ascending aorta.  Wire was pulled out.  The catheter was aspirated and connected to the Manifold.  Catheter was further advanced and engaged into left coronary ostium.  Multiple views of the left system were taken.  Next, the catheter was disengaged and was pulled out over the wire and was replaced with 6-French right guiding catheter which was advanced over the wire under fluoroscopic guidance up to the ascending aorta.  Wire was pulled out.  The catheter was aspirated and connected to the Manifold.  Catheter was further advanced and engaged into right coronary ostium.  A single view of right coronary artery was obtained. Next, this catheter was disengaged and was pulled out over the wire at the  end of the procedure and was replaced with 6-French pigtail catheter which was advanced over the wire under fluoroscopic guidance up to the ascending aorta.  Wire was pulled out.  The catheter was aspirated and connected to the Manifold.  Catheter was further advanced across the aortic valve into the LV.  LV pressures were recorded.  Next, LV graphy was done in 30 degree RAO position.  Post angiographic pressures were recorded from LV and then pullback pressures were recorded from the aorta.  There was no gradient across the aortic valve.  Next, the pigtail catheter was pulled out over the wire.  Sheaths were aspirated and flushed.  Her left main was short which was patent.  LAD has 20-30% proximal and 25-35% mid sequential stenosis.  Left circumflex  has proximal 30% stenosis.  OM-1 and 2 are very small.  OM-3 is large which is patent.  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  INTERVENTIONAL PROCEDURE:  Prior to PCI, transvenous pacemaker was inserted up to RV apex without any difficulty.  Next, I attempted to do PTCA to proximal and mid RCA initially using 1.5 x 20-mm long mini trek balloon.  Multiple inflations were done with no improvement in flow. Then, PTCA to mid RCA was done using 2.5 x12-mm long mini trek balloon with improvement in flow in distal RCA, but with large thrombus burden in the mid and proximal portion.  The wire appears to have gone from subintimal space to true lumen.  Attempted to rewire true lumen into acute RV branch.  Attempted to rewire to the main distal RCA with success and then PTCA to mid and distal junction of RCA was done using 1.25 mm x 20 mm long sprinter Legend balloon.  Angiogram after inflation in mid and distal junction of RCA showed type 2 dissection with myocardial hematoma and mild extravasation of the dye.  The patient remained hemodynamically stable, attempted to deploy Joe med covered stent up to mid and distal RCA without success as stent could not be tracked down.  Her angiogram was discontinued.  Emergency 2D echo was also obtained which showed no significant pericardial effusion or if any evidence of tamponade. 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  The patient was transferred to recovery room in stable condition.     Eduardo Osier. Sharyn Lull, M.D.     [facility]MED[facility]  D:  01/18/2011  T:  01/18/2011  Job:  [id number]

## 2011-01-18 NOTE — ED Notes (Signed)
Patient transported to C with RN and RT. Pt on Zoll and monitor.

## 2011-01-18 NOTE — Progress Notes (Signed)
eLink Physician-Brief Progress Note Patient Name: Rachel Chase DOB: 1940-09-10 MRN: [id number]  Date of Service  01/18/2011   HPI/Events of Note   No in atrial fibrillation  eICU Interventions  Notify cardiology,  Give Lopressor, may need DCCV or amiodarone IV   Intervention Category Major Interventions: Arrhythmia - evaluation and management Intermediate Interventions: Diagnostic test evaluation Minor Interventions: Clinical assessment - ordering diagnostic tests  Shan Levans 01/18/2011, 9:40 PM

## 2011-01-18 NOTE — ED Notes (Signed)
ekg completed at [facility]

## 2011-01-18 NOTE — Progress Notes (Signed)
eLink Physician-Brief Progress Note Patient Name: Rachel Chase DOB: 03/13/1940 MRN: [id number]  Date of Service  01/18/2011   HPI/Events of Note  CVP 20, pt in shock  eICU Interventions  Vasopressin ordered for shock    Intervention Category Major Interventions: Shock - evaluation and management Intermediate Interventions: Diagnostic test evaluation Minor Interventions: Clinical assessment - ordering diagnostic tests  Shan Levans 01/18/2011, 8:55 PM

## 2011-01-18 NOTE — Consult Note (Signed)
Reason for Consult: V. fib cardiac arrest Acute inferior wall myocardial infarction Hypotensive shock Referring Physician: CCM  Rachel Chase is an 70 y.o. female.  HPI: Patient is 70 year old female with past medical history significant for hypertension degenerative joint disease came to the ER following out of hospital V. fib cardiac arrest requiring defibrillation and intubation. As per son patient had chest pain approximately 4 days ago lasting for 3 hours associated with shortness of breath and diaphoresis the patient did not seek any medical attention until today when in the kitchen she suddenly collapsed and hit her head. Patient denies any chest pain shortness of breath the or palpitations prior to her collapse as per son The patient did CPR her at home prior to EMS arrival and subsequently was found in V. fib requiring defibrillation x1 and was transferred to ED In ER patient was noted to be hypotensive with blood pressure in 80s and unresponsive intubated. Patient initially was taken to CT to rule out a bleed and then subsequently was emergently transferred to Cath Lab for emergency PCI. Discussed with patient son regarding emergency left eye possible PTCA stenting its risk and benefits and consented for the procedure   Past Medical History  Diagnosis Date  . Hypertension   . Back pain     History reviewed. No pertinent past surgical history.  History reviewed. No pertinent family history.  Social History:  reports that she has never smoked. She does not have any smokeless tobacco history on file. She reports that she does not drink alcohol. Her drug history not on file.  Allergies: No Known Allergies  Medications: Questionable blood pressure medicine  Results for orders placed during the hospital encounter of 01/18/11 (from the past 48 hour(s))  CBC     Status: Abnormal   Collection Time   01/18/11  4:20 PM      Component Value Range Comment   WBC 13.5 (*) 4.0 - 10.5  (K/uL)    RBC 4.42  3.87 - 5.11 (MIL/uL)    Hemoglobin 12.9  12.0 - 15.0 (g/dL)    HCT 40.9  81.1 - 91.4 (%)    MCV 86.2  78.0 - 100.0 (fL)    MCH 29.2  26.0 - 34.0 (pg)    MCHC 33.9  30.0 - 36.0 (g/dL)    RDW 78.2  95.6 - 21.3 (%)    Platelets 350  150 - 400 (K/uL)   DIFFERENTIAL     Status: Abnormal   Collection Time   01/18/11  4:20 PM      Component Value Range Comment   Neutrophils Relative 60  43 - 77 (%)    Neutro Abs 8.1 (*) 1.7 - 7.7 (K/uL)    Lymphocytes Relative 31  12 - 46 (%)    Lymphs Abs 4.2 (*) 0.7 - 4.0 (K/uL)    Monocytes Relative 7  3 - 12 (%)    Monocytes Absolute 1.0  0.1 - 1.0 (K/uL)    Eosinophils Relative 2  0 - 5 (%)    Eosinophils Absolute 0.2  0.0 - 0.7 (K/uL)    Basophils Relative 1  0 - 1 (%)    Basophils Absolute 0.1  0.0 - 0.1 (K/uL)   PROTIME-INR     Status: Normal   Collection Time   01/18/11  4:20 PM      Component Value Range Comment   Prothrombin Time 12.8  11.6 - 15.2 (seconds)    INR 0.94  0.00 - 1.49  APTT     Status: Normal   Collection Time   01/18/11  4:20 PM      Component Value Range Comment   aPTT 35  24 - 37 (seconds)   COMPREHENSIVE METABOLIC PANEL     Status: Abnormal   Collection Time   01/18/11  4:20 PM      Component Value Range Comment   Sodium 137  135 - 145 (mEq/L)    Potassium 2.8 (*) 3.5 - 5.1 (mEq/L)    Chloride 99  96 - 112 (mEq/L)    CO2 21  19 - 32 (mEq/L)    Glucose, Bld 192 (*) 70 - 99 (mg/dL)    BUN 32 (*) 6 - 23 (mg/dL)    Creatinine, Ser 4.09 (*) 0.50 - 1.10 (mg/dL)    Calcium 8.4  8.4 - 10.5 (mg/dL)    Total Protein 6.4  6.0 - 8.3 (g/dL)    Albumin 3.2 (*) 3.5 - 5.2 (g/dL)    AST 811 (*) 0 - 37 (U/L)    ALT 156 (*) 0 - 35 (U/L)    Alkaline Phosphatase 67  39 - 117 (U/L)    Total Bilirubin 0.3  0.3 - 1.2 (mg/dL)    GFR calc non Af Amer 38 (*) >90 (mL/min)    GFR calc Af Amer 44 (*) >90 (mL/min)   POCT I-STAT TROPONIN I     Status: Abnormal   Collection Time   01/18/11  4:25 PM      Component  Value Range Comment   Troponin i, poc 4.34 (*) 0.00 - 0.08 (ng/mL)    Comment NOTIFIED PHYSICIAN      Comment 3            POCT I-STAT, CHEM 8     Status: Abnormal   Collection Time   01/18/11  4:26 PM      Component Value Range Comment   Sodium 137  135 - 145 (mEq/L)    Potassium 2.8 (*) 3.5 - 5.1 (mEq/L)    Chloride 102  96 - 112 (mEq/L)    BUN 34 (*) 6 - 23 (mg/dL)    Creatinine, Ser 9.14 (*) 0.50 - 1.10 (mg/dL)    Glucose, Bld 782 (*) 70 - 99 (mg/dL)    Calcium, Ion 9.56 (*) 1.12 - 1.32 (mmol/L)    TCO2 21  0 - 100 (mmol/L)    Hemoglobin 12.6  12.0 - 15.0 (g/dL)    HCT 21.3  08.6 - 57.8 (%)     Ct Head Wo Contrast  01/18/2011  *RADIOLOGY REPORT*  Clinical Data:  Cardiac arrest, syncope, fell, question head injury  CT HEAD WITHOUT CONTRAST CT CERVICAL SPINE WITHOUT CONTRAST  Technique:  Multidetector CT imaging of the head and cervical spine was performed following the standard protocol without intravenous contrast.  Multiplanar CT image reconstructions of the cervical spine were also generated.  Comparison:  None  CT HEAD  Findings: Generalized atrophy. Normal ventricular morphology. No midline shift or mass effect. Minimal small vessel chronic ischemic changes of deep cerebral white matter. No intracranial hemorrhage, mass lesion, or acute infarction. Visualized paranasal sinuses and mastoid air cells clear. Bones unremarkable.  IMPRESSION: Minimal small vessel chronic ischemic changes of deep cerebral white matter. No acute intracranial abnormalities.  CT CERVICAL SPINE  Findings: Disc space narrowing and endplate spur formation C5-C6, C 67, V 71. Bones appear slightly demineralized. Prevertebral soft tissues normal thickness. Vertebral body heights maintained without fracture or subluxation. Visualized skull  base intact. Scattered atherosclerotic calcifications. Endotracheal tube noted. Lung apices clear.  IMPRESSION: Degenerative disc disease changes of the cervical spine as above. No  acute cervical spine abnormalities.  Original Report Authenticated By: Lollie Marrow, M.D.   Ct Cervical Spine Wo Contrast  01/18/2011  *RADIOLOGY REPORT*  Clinical Data:  Cardiac arrest, syncope, fell, question head injury  CT HEAD WITHOUT CONTRAST CT CERVICAL SPINE WITHOUT CONTRAST  Technique:  Multidetector CT imaging of the head and cervical spine was performed following the standard protocol without intravenous contrast.  Multiplanar CT image reconstructions of the cervical spine were also generated.  Comparison:  None  CT HEAD  Findings: Generalized atrophy. Normal ventricular morphology. No midline shift or mass effect. Minimal small vessel chronic ischemic changes of deep cerebral white matter. No intracranial hemorrhage, mass lesion, or acute infarction. Visualized paranasal sinuses and mastoid air cells clear. Bones unremarkable.  IMPRESSION: Minimal small vessel chronic ischemic changes of deep cerebral white matter. No acute intracranial abnormalities.  CT CERVICAL SPINE  Findings: Disc space narrowing and endplate spur formation C5-C6, C 67, V 71. Bones appear slightly demineralized. Prevertebral soft tissues normal thickness. Vertebral body heights maintained without fracture or subluxation. Visualized skull base intact. Scattered atherosclerotic calcifications. Endotracheal tube noted. Lung apices clear.  IMPRESSION: Degenerative disc disease changes of the cervical spine as above. No acute cervical spine abnormalities.  Original Report Authenticated By: Lollie Marrow, M.D.    Review of Systems  Unable to perform ROS: intubated   Blood pressure 88/52, pulse 75, resp. rate 16, weight 75 kg (165 lb 5.5 oz), SpO2 100.00%. Physical Exam  Eyes: Pupils are equal, round, and reactive to light.  Neck: Neck supple. No JVD present.  Cardiovascular: Normal rate and regular rhythm.  Exam reveals no friction rub.   No murmur heard. Respiratory:       Decreased breath sounds at bases  GI: Soft.  Bowel sounds are normal.  Musculoskeletal: She exhibits no edema.    Assessment/Plan: 1 status post out of hospital V. fib cardiac arrest  Acute inferior wall MI Hypokalemia Hypotensive shock Vent. dependent respiratory failure secondary to 1 Plan Discussed this her son at length regarding emergency left Possible PTCA stenting its risk and benefits i.e. death MI stroke need for emergency CABG local vascular complications etc. and consented for PCI  [facility] N 01/18/2011, 8:01 PM

## 2011-01-18 NOTE — ED Notes (Signed)
EMS-pt was standing in kitchen and had witnessed arrest approx 45 min ago, son started cpr on scene. On ems arrival pt with agonal breathing. No pulse felt. vtach on monitor. Pt shocked approx 45 ago at 200j. Pulses regained. IO placed to left tib. itubated with 7.0. Initial BP 150/68.

## 2011-01-18 NOTE — Progress Notes (Signed)
Asked to consult to review medication interactions with amiodarone.  Amiodarone may increase LFTs - monitor closely with concomittant crestor use. Amiodarone may increase crestor levels - monitor for muscle pain.  Pharmacy will sign off  - please contact pharmacy again if we can be of further assistance.  Thanks,   Marchelle Folks. Illene Bolus, PharmD, BCPS Clinical Pharmacist Pager: [phone] 01/18/2011 10:51 PM

## 2011-01-18 NOTE — Progress Notes (Signed)
CRITICAL VALUE ALERT  Critical value received:  Calcium 6.1   Date of notification:  01/18/11 Time of notification:  2144 Critical value read back:yes  Nurse who received alert:  K. Thad Ranger RN MD notified (1st page):  Delford Field Time of first page:  2150 MD notified (2nd page):  Time of second page:  Responding MD:  Delford Field Time MD responded:  2150

## 2011-01-18 NOTE — ED Notes (Signed)
Pt back to room 4. PCCM at bedside for central line placement due to low blood pressure. Cath lab team is at bedside.

## 2011-01-18 NOTE — H&P (Signed)
Name: Rachel Chase MRN: [id number] DOB: 12/14/1940  DOS:   01/18/2011    CRITICAL CARE MEDICINE ADMISSION / CONSULTATION NOTE  Referring Physician  Dr. Rubin Payor  Reason For Consult  cardiac arrest, unconscious, hypotension, hypothermia  Summary:  Patient Description  Patient is in 70 year old woman brought to the ER after she got unresponsive and well down at her home-son gave CPR and called 911-who shocked her once as she was found in V. Tach- got back pulse. Intubated in ER due to agonal breathing-found hypothermic and EKG shows signs of right coronary MI.    Line Location Start Stop  central venous line  ET Tube  right [place] TLC  01/18/2011 01/18/2011     Culture Date Result       Antibiotic Indication Start Stop        GI Prophylaxis DVT Prophylaxis      Protocols     Consultants  Cardiology      Date Imaging Studies   01/18/2011  CT head and cervical spine    Date Events  01/18/2011   endotracheal intubation in ER.  cardiac catheterization     The patient is sedated, intubated and unable to provide history, which was obtained from son who is at bedside.  History Of Present Illness : Patient is a 70 year woman who is brought to the ER for cardiac arrest and unconsciousness after getting cardioverted by EMS for V. tach. As per son, she is complaining of chest pain for past 2-3 days. She was standing in her kitchen and suddenly got unconscious and fell down. Some started giving CPR and called EMS. In ER patient was found to be hypothermic and with agonal respiration and so was intubated by EDP. PCCM was consulted for further management of  ventilation and other critical illnesses.   No past medical history on file.  No past surgical history on file.  Prior to Admission medications   Not on File    No Known Allergies  No family history on file.  Social History  does not have a smoking history on file. She does not have any smokeless tobacco history  on file. Her alcohol and drug histories not on file.  Review Of Systems  11 points review of systems is negative with an exception of listed in HPI.  Physical Examination  BP 82/50  Pulse 90  Wt 165 lb 5.5 oz (75 kg)  Neuro:  Sedated, intubated HEENT:  ETT/OGT Heart:  RRR, no M/R/G Lungs:  Bilateral air entry, no W/R/R Abdomen:  Soft, non tender, bowel sounds present Extremities:  Bowel sounds present  Labs  Results for orders placed during the hospital encounter of 01/18/11 (from the past 24 hour(s))  CBC     Status: Abnormal   Collection Time   01/18/11  4:20 PM      Component Value Range   WBC 13.5 (*) 4.0 - 10.5 (K/uL)   RBC 4.42  3.87 - 5.11 (MIL/uL)   Hemoglobin 12.9  12.0 - 15.0 (g/dL)   HCT 11.9  14.7 - 82.9 (%)   MCV 86.2  78.0 - 100.0 (fL)   MCH 29.2  26.0 - 34.0 (pg)   MCHC 33.9  30.0 - 36.0 (g/dL)   RDW 56.2  13.0 - 86.5 (%)   Platelets 350  150 - 400 (K/uL)  DIFFERENTIAL     Status: Abnormal   Collection Time   01/18/11  4:20 PM      Component Value Range  Neutrophils Relative 60  43 - 77 (%)   Neutro Abs 8.1 (*) 1.7 - 7.7 (K/uL)   Lymphocytes Relative 31  12 - 46 (%)   Lymphs Abs 4.2 (*) 0.7 - 4.0 (K/uL)   Monocytes Relative 7  3 - 12 (%)   Monocytes Absolute 1.0  0.1 - 1.0 (K/uL)   Eosinophils Relative 2  0 - 5 (%)   Eosinophils Absolute 0.2  0.0 - 0.7 (K/uL)   Basophils Relative 1  0 - 1 (%)   Basophils Absolute 0.1  0.0 - 0.1 (K/uL)  PROTIME-INR     Status: Normal   Collection Time   01/18/11  4:20 PM      Component Value Range   Prothrombin Time 12.8  11.6 - 15.2 (seconds)   INR 0.94  0.00 - 1.49   APTT     Status: Normal   Collection Time   01/18/11  4:20 PM      Component Value Range   aPTT 35  24 - 37 (seconds)  POCT I-STAT TROPONIN I     Status: Abnormal   Collection Time   01/18/11  4:25 PM      Component Value Range   Troponin i, poc 4.34 (*) 0.00 - 0.08 (ng/mL)   Comment NOTIFIED PHYSICIAN     Comment 3           POCT I-STAT,  CHEM 8     Status: Abnormal   Collection Time   01/18/11  4:26 PM      Component Value Range   Sodium 137  135 - 145 (mEq/L)   Potassium 2.8 (*) 3.5 - 5.1 (mEq/L)   Chloride 102  96 - 112 (mEq/L)   BUN 34 (*) 6 - 23 (mg/dL)   Creatinine, Ser 6.21 (*) 0.50 - 1.10 (mg/dL)   Glucose, Bld 308 (*) 70 - 99 (mg/dL)   Calcium, Ion 6.57 (*) 1.12 - 1.32 (mmol/L)   TCO2 21  0 - 100 (mmol/L)   Hemoglobin 12.6  12.0 - 15.0 (g/dL)   HCT 84.6  96.2 - 95.2 (%)    Imaging  01/18/2011    Ct Head and Cervical Spine Wo Contrast    CT HEAD    IMPRESSION: Minimal small vessel chronic ischemic changes of deep cerebral white matter. No acute intracranial abnormalities.    CT CERVICAL SPINE   IMPRESSION: Degenerative disc disease changes of the cervical spine as above. No acute cervical spine abnormalities.      Assessment 1) VDRF:  Likely multifactorial secondary to cardiac arrest, hypothermia.  Plan: - Waiting for ABG to determine the nature of respiratory failure and manage accordingly per vent. - Patient currently taken to Cath Lab for emergent cardiac catheterization. - Bicarbonate on point-of-care markers is 21- so doesn't seem to be overtly acidotic.  2) Cardiac Arrest:  Likely secondary to right coronary MI per EKG. Got cardioverted with 200 J as was found in V. Tach. - Got her pulse back.  Plan:  - Dr. Sharyn Lull from cardiology evaluated the patient in ER and took her to Cath Lab for emergent catheterization.  3) Hypothermia: Started on hypothermia protocol in ER.  4) Hypotension: Likely multifactorial from cardiac arrest/MI versus hypothermia. Plan: - Right subclavian central line inserted in ER - Normal saline at 75 cc an hour.  5) Hypokalemia: K -2.8 Plan: - Replete.  6) Diabetes mellitus:  Plan: - ICU hyperglycemia protocol.  7) Shock: Cardiogenic likely, will follow CVP and use IVF  and pressors as needed.  The patient is critically ill with multiple organ systems failure  and requires high complexity decision making for assessment and support, frequent evaluation and titration of therapies, application of advanced monitoring technologies and extensive interpretation of multiple databases. Critical Care Time devoted to patient care services described in this note is 90 minutes.  PATEL,RAVI, MD 01/18/2011, 5:05 PM

## 2011-01-18 NOTE — ED Notes (Signed)
Pt transported to cath lab at 1645 with staff. On arrival to cath lab 2900 staff on arrival with artic sun. meds started per protocol, see MAR for time. 2900 staff to place foley and cooling pads per protocol.

## 2011-01-18 NOTE — Procedures (Signed)
Central Venous Catheter Insertion Procedure Note Rachel Chase [id number] Sep 22, 1940  Procedure: Insertion of Central Venous Catheter Indications: Drug and/or fluid administration  Procedure Details Consent: Risks of procedure as well as the alternatives and risks of each were explained to the (patient/caregiver).  Consent for procedure obtained. Time Out: Verified patient identification, verified procedure, site/side was marked, verified correct patient position, special equipment/implants available, medications/allergies/relevent history reviewed, required imaging and test results available.  Performed  Maximum sterile technique was used including antiseptics, cap, gloves, gown, hand hygiene, mask and sheet. Skin prep: Chlorhexidine; local anesthetic administered A antimicrobial bonded/coated triple lumen catheter was placed in the right subclavian vein using the Seldinger technique.  Evaluation Blood flow good Complications: No apparent complications Patient did tolerate procedure well. Chest X-ray ordered to verify placement.  CXR: pending.  Rachel Chase 01/18/2011, 5:02 PM

## 2011-01-18 NOTE — Progress Notes (Signed)
*  PRELIMINARY RESULTS* Echocardiogram  Limited 2D echocardiogram has been performed.  Clide Deutscher RDCS 01/18/2011, 8:03 PM

## 2011-01-19 ENCOUNTER — Inpatient Hospital Stay (HOSPITAL_COMMUNITY): Payer: Medicare Other

## 2011-01-19 DIAGNOSIS — R570 Cardiogenic shock: Secondary | ICD-10-CM

## 2011-01-19 LAB — BASIC METABOLIC PANEL
BUN: 27 mg/dL — ABNORMAL HIGH (ref 6–23)
BUN: 24 mg/dL — ABNORMAL HIGH (ref 6–23)
BUN: 25 mg/dL — ABNORMAL HIGH (ref 6–23)
BUN: 25 mg/dL — ABNORMAL HIGH (ref 6–23)
BUN: 25 mg/dL — ABNORMAL HIGH (ref 6–23)
BUN: 24 mg/dL — ABNORMAL HIGH (ref 6–23)
CO2: 13 mEq/L — ABNORMAL LOW (ref 19–32)
CO2: 10 mEq/L — CL (ref 19–32)
CO2: 12 mEq/L — ABNORMAL LOW (ref 19–32)
Calcium: 6.5 mg/dL — ABNORMAL LOW (ref 8.4–10.5)
Calcium: 5.7 mg/dL — CL (ref 8.4–10.5)
Calcium: 6.3 mg/dL — CL (ref 8.4–10.5)
Calcium: 6.6 mg/dL — ABNORMAL LOW (ref 8.4–10.5)
Calcium: 7 mg/dL — ABNORMAL LOW (ref 8.4–10.5)
Chloride: 106 mEq/L (ref 96–112)
Chloride: 106 mEq/L (ref 96–112)
Chloride: 107 mEq/L (ref 96–112)
Chloride: 108 mEq/L (ref 96–112)
Creatinine, Ser: 0.78 mg/dL (ref 0.50–1.10)
Creatinine, Ser: 0.77 mg/dL (ref 0.50–1.10)
Creatinine, Ser: 0.94 mg/dL (ref 0.50–1.10)
Creatinine, Ser: 0.98 mg/dL (ref 0.50–1.10)
Creatinine, Ser: 0.77 mg/dL (ref 0.50–1.10)
GFR calc Af Amer: >90 mL/min (ref >90)
GFR calc Af Amer: >90 mL/min (ref >90)
GFR calc Af Amer: 70 mL/min — ABNORMAL LOW (ref >90)
GFR calc Af Amer: 66 mL/min — ABNORMAL LOW (ref >90)
GFR calc Af Amer: 76 mL/min — ABNORMAL LOW (ref >90)
GFR calc Af Amer: >90 mL/min (ref >90)
GFR calc non Af Amer: 83 mL/min — ABNORMAL LOW (ref >90)
GFR calc non Af Amer: 83 mL/min — ABNORMAL LOW (ref >90)
GFR calc non Af Amer: 57 mL/min — ABNORMAL LOW (ref >90)
GFR calc non Af Amer: 66 mL/min — ABNORMAL LOW (ref >90)
GFR calc non Af Amer: 73 mL/min — ABNORMAL LOW (ref >90)
GFR calc non Af Amer: 83 mL/min — ABNORMAL LOW (ref >90)
Glucose, Bld: 415 mg/dL — ABNORMAL HIGH (ref 70–99)
Glucose, Bld: 512 mg/dL — ABNORMAL HIGH (ref 70–99)
Glucose, Bld: 501 mg/dL — ABNORMAL HIGH (ref 70–99)
Glucose, Bld: 243 mg/dL — ABNORMAL HIGH (ref 70–99)
Potassium: 3.8 mEq/L (ref 3.5–5.1)
Potassium: 3.8 mEq/L (ref 3.5–5.1)
Potassium: 3.7 mEq/L (ref 3.5–5.1)
Potassium: 2.8 mEq/L — ABNORMAL LOW (ref 3.5–5.1)
Sodium: 135 mEq/L (ref 135–145)
Sodium: 132 mEq/L — ABNORMAL LOW (ref 135–145)
Sodium: 133 mEq/L — ABNORMAL LOW (ref 135–145)
Sodium: 134 mEq/L — ABNORMAL LOW (ref 135–145)
Sodium: 136 mEq/L (ref 135–145)
Sodium: 135 mEq/L (ref 135–145)

## 2011-01-19 LAB — POCT I-STAT 3, ART BLOOD GAS (G3+)
Acid-base deficit: 14 mmol/L — ABNORMAL HIGH (ref 0.0–2.0)
Acid-base deficit: 19 mmol/L — ABNORMAL HIGH (ref 0.0–2.0)
Acid-base deficit: 12 mmol/L — ABNORMAL HIGH (ref 0.0–2.0)
Bicarbonate: 10.1 mEq/L — ABNORMAL LOW (ref 20.0–24.0)
Bicarbonate: 12.6 mEq/L — ABNORMAL LOW (ref 20.0–24.0)
O2 Saturation: 99 %
Patient temperature: 33.6
Patient temperature: 33.2
TCO2: 13 mmol/L (ref 0–100)
pCO2 arterial: 27.9 mmHg — ABNORMAL LOW (ref 35.0–45.0)
pCO2 arterial: 28.8 mmHg — ABNORMAL LOW (ref 35.0–45.0)
pCO2 arterial: 21.4 mmHg — ABNORMAL LOW (ref 35.0–45.0)
pH, Arterial: 7.196 — CL (ref 7.350–7.400)
pO2, Arterial: 151 mmHg — ABNORMAL HIGH (ref 80.0–100.0)
pO2, Arterial: 141 mmHg — ABNORMAL HIGH (ref 80.0–100.0)

## 2011-01-19 LAB — APTT: aPTT: 63 seconds — ABNORMAL HIGH (ref 24–37)

## 2011-01-19 LAB — GLUCOSE, CAPILLARY
Glucose-Capillary: 246 mg/dL — ABNORMAL HIGH (ref 70–99)
Glucose-Capillary: 251 mg/dL — ABNORMAL HIGH (ref 70–99)
Glucose-Capillary: 280 mg/dL — ABNORMAL HIGH (ref 70–99)
Glucose-Capillary: 374 mg/dL — ABNORMAL HIGH (ref 70–99)
Glucose-Capillary: 401 mg/dL — ABNORMAL HIGH (ref 70–99)
Glucose-Capillary: 403 mg/dL — ABNORMAL HIGH (ref 70–99)
Glucose-Capillary: 441 mg/dL — ABNORMAL HIGH (ref 70–99)
Glucose-Capillary: 473 mg/dL — ABNORMAL HIGH (ref 70–99)
Glucose-Capillary: 416 mg/dL — ABNORMAL HIGH (ref 70–99)
Glucose-Capillary: 248 mg/dL — ABNORMAL HIGH (ref 70–99)
Glucose-Capillary: 314 mg/dL — ABNORMAL HIGH (ref 70–99)
Glucose-Capillary: 324 mg/dL — ABNORMAL HIGH (ref 70–99)
Glucose-Capillary: 114 mg/dL — ABNORMAL HIGH (ref 70–99)
Glucose-Capillary: 141 mg/dL — ABNORMAL HIGH (ref 70–99)
Glucose-Capillary: 187 mg/dL — ABNORMAL HIGH (ref 70–99)
Glucose-Capillary: 203 mg/dL — ABNORMAL HIGH (ref 70–99)

## 2011-01-19 LAB — CARDIAC PANEL(CRET KIN+CKTOT+MB+TROPI)
CK, MB: 42.4 ng/mL (ref 0.3–4.0)
CK, MB: 98.6 ng/mL (ref 0.3–4.0)
Relative Index: 13.5 — ABNORMAL HIGH (ref 0.0–2.5)
Total CK: 315 U/L — ABNORMAL HIGH (ref 7–177)
Total CK: 567 U/L — ABNORMAL HIGH (ref 7–177)
Troponin I: 9.5 ng/mL (ref <0.30)

## 2011-01-19 LAB — PROTIME-INR: INR: 1.57 — ABNORMAL HIGH (ref 0.00–1.49)

## 2011-01-19 LAB — POCT ACTIVATED CLOTTING TIME: Activated Clotting Time: 303 seconds

## 2011-01-19 MED ORDER — DEXTROSE 50 % IV SOLN
INTRAVENOUS | Status: AC
  Administered 2011-01-20: 13 mL via INTRAVENOUS
  Administered 2011-01-20: 19 mL
  Filled 2011-01-19: qty 50

## 2011-01-19 MED ORDER — ENOXAPARIN SODIUM 40 MG/0.4ML ~~LOC~~ SOLN
40.0000 mg | SUBCUTANEOUS | Status: DC
  Administered 2011-01-19 – 2011-01-31 (×13): 40 mg via SUBCUTANEOUS
  Filled 2011-01-19 (×13): qty 0.4

## 2011-01-19 MED ORDER — SODIUM CHLORIDE 0.9 % IV SOLN
0.5000 ug/kg/min | INTRAVENOUS | Status: DC
  Filled 2011-01-19: qty 20

## 2011-01-19 MED ORDER — POTASSIUM CHLORIDE 10 MEQ/100ML IV SOLN
10.0000 meq | INTRAVENOUS | Status: AC
  Administered 2011-01-19 (×4): 10 meq via INTRAVENOUS
  Filled 2011-01-19 (×4): qty 100

## 2011-01-19 MED ORDER — SODIUM CHLORIDE 0.9 % IV SOLN
1.0000 ug/kg/min | INTRAVENOUS | Status: DC
  Administered 2011-01-19: 1.5 ug/kg/min via INTRAVENOUS
  Filled 2011-01-19: qty 20

## 2011-01-19 MED ORDER — SODIUM CHLORIDE 0.9 % IV SOLN
1.0000 g | Freq: Once | INTRAVENOUS | Status: AC
  Administered 2011-01-19: 1 g via INTRAVENOUS
  Filled 2011-01-19: qty 10

## 2011-01-19 MED ORDER — PHENYLEPHRINE HCL 10 MG/ML IJ SOLN
30.0000 ug/min | INTRAVENOUS | Status: DC
  Administered 2011-01-19: 125 ug/min via INTRAVENOUS
  Filled 2011-01-19 (×2): qty 4

## 2011-01-19 MED ORDER — PNEUMOCOCCAL VAC POLYVALENT 25 MCG/0.5ML IJ INJ
0.5000 mL | INJECTION | INTRAMUSCULAR | Status: DC
  Filled 2011-01-19: qty 0.5

## 2011-01-19 MED ORDER — POTASSIUM CHLORIDE 10 MEQ/100ML IV SOLN: 10.0000 meq | INTRAVENOUS | Status: DC

## 2011-01-19 MED ORDER — SODIUM BICARBONATE 8.4 % IV SOLN
INTRAVENOUS | Status: DC
  Administered 2011-01-19 – 2011-01-21 (×5): via INTRAVENOUS
  Filled 2011-01-19 (×11): qty 150

## 2011-01-19 MED FILL — Dextrose Inj 5%: INTRAVENOUS | Qty: 50 | Status: AC

## 2011-01-19 NOTE — Progress Notes (Signed)
CRITICAL VALUE ALERT  Critical value received:  CO2: 10 and Ca: 5.7  Date of notification:  01/19/11  Time of notification:  0247  Critical value read back:yes  Nurse who received alert:  K. Thad Ranger, RN  MD notified (1st page):  Delford Field  Time of first page:  0253  MD notified (2nd page):  Time of second page:  Responding MD:  Delford Field  Time MD responded:  [phone]

## 2011-01-19 NOTE — Progress Notes (Addendum)
eLink Physician-Brief Progress Note Patient Name: Rachel Chase DOB: 12/31/1940 MRN: [id number]  Date of Service  01/19/2011   HPI/Events of Note   BS 510   HCO3 10  Ca 5.7  eICU Interventions  NaHco3 drip ,insulin drip ordered, IV Ca ordered   Intervention Category Major Interventions: Hyperglycemia - active titration of insulin therapy;Acid-Base disturbance - evaluation and management Intermediate Interventions: Diagnostic test evaluation Minor Interventions: Clinical assessment - ordering diagnostic tests  Shan Levans 01/19/2011, 3:02 AM

## 2011-01-19 NOTE — Progress Notes (Signed)
UR Completed.  Jenney Brester Jane [phone] 01/19/2011  

## 2011-01-19 NOTE — Progress Notes (Signed)
INITIAL ADULT NUTRITION ASSESSMENT Date: 01/19/2011   Time: 1:46 PM Reason for Assessment: Consult- TF recs  ASSESSMENT: Female 70 y.o.  Dx: VDRF, cardiac arrest, hypotensive shock, hypokalemia  Past Medical History  Diagnosis Date  . Hypertension   . Back pain     Lower Back pain - takes Cortisone shots  . Shortness of breath   . Pneumonia   . Arthritis   . Anxiety     Scheduled Meds:    . amiodarone (CORDARONE) IV bolus only 150 mg/100 mL  150 mg Intravenous Once  . amiodarone (CORDARONE,NEXTERONE) 360 mg in dextrose 5% 200 mL      . antiseptic oral rinse  15 mL Mouth Rinse QID  . artificial tears  1 application Both Eyes Q8H  . aspirin  324 mg Oral NOW   Or  . aspirin  300 mg Rectal NOW  . aspirin  300 mg Rectal Daily  . bivalirudin      . bivalirudin      . calcium gluconate  1 g Intravenous Once  . calcium gluconate  1 g Intravenous Once  . carvedilol  3.125 mg Oral BID WC  . chlorhexidine  15 mL Mouth Rinse BID  . enoxaparin (LOVENOX) injection  40 mg Subcutaneous Q24H  . fentaNYL  100 mcg Intravenous Once  . heparin      . lidocaine      . nitroGLYCERIN      . pantoprazole (PROTONIX) IV  40 mg Intravenous Daily  . pneumococcal 23 valent vaccine  0.5 mL Intramuscular Tomorrow-1000  . potassium chloride  10 mEq Intravenous Q1 Hr x 4  . potassium chloride  10 mEq Intravenous Q1 Hr x 4  . rosuvastatin  20 mg Per NG tube Daily  . sodium bicarbonate      . sodium chloride  1,000 mL Intravenous Once  . sodium chloride  1,000 mL Intravenous Once  . sodium chloride  1,000 mL Intravenous Once  . sodium chloride  750 mL Intravenous Once  . DISCONTD: amiodarone  150 mg Intravenous Once  . DISCONTD: artificial tears  1 application Both Eyes Q8H  . DISCONTD: aspirin EC  81 mg Oral Daily  . DISCONTD: aspirin  300 mg Rectal NOW  . DISCONTD: aspirin  300 mg Rectal NOW  . DISCONTD: cisatracurium  0.1 mg/kg Intravenous Once  . DISCONTD: cisatracurium  0.1 mg/kg  Intravenous Once  . DISCONTD: fentaNYL  100 mcg Intravenous Once  . DISCONTD: insulin aspart  0-4 Units Subcutaneous Q4H  . DISCONTD: metoprolol      . DISCONTD: metoprolol  2.5 mg Intravenous Q2H  . DISCONTD: midazolam  2 mg Intravenous Once  . DISCONTD: midazolam  2 mg Intravenous Once  . DISCONTD: potassium chloride  10 mEq Intravenous Q1 Hr x 6  . DISCONTD: potassium chloride  10 mEq Intravenous Q1 Hr x 4  . DISCONTD: potassium chloride  10 mEq Intravenous Q1 Hr x 4   Continuous Infusions:    . amiodarone (CORDARONE) infusion 60 mg/hr (01/18/11 2300)   Followed by  . amiodarone (CORDARONE) infusion 15 mg/hr (01/19/11 0820)  . cisatracurium (NIMBEX) infusion    . DOPamine 5 mcg/kg/min (01/19/11 0800)  . fentaNYL infusion INTRAVENOUS 100 mcg/hr (01/18/11 2300)  . insulin (NOVOLIN-R) infusion 35.6 Units/hr (01/19/11 1300)  . midazolam (VERSED) infusion 2 mg/hr (01/19/11 0230)  . norepinephrine (LEVOPHED) Adult infusion 14 mcg/min (01/19/11 1251)  . phenylephrine (NEO-SYNEPHRINE) Adult infusion Stopped (01/19/11 0709)  .  sodium bicarbonate  infusion 75 mL/hr at 01/19/11 0700  . DISCONTD: sodium chloride 50 mL/hr at 01/18/11 2000  . DISCONTD: cisatracurium (NIMBEX) infusion 1 mcg/kg/min (01/18/11 2300)  . DISCONTD: cisatracurium (NIMBEX) infusion    . DISCONTD: dextrose    . DISCONTD: fentaNYL infusion INTRAVENOUS    . DISCONTD: midazolam (VERSED) infusion    . DISCONTD: norepinephrine (LEVOPHED) Adult infusion 30 mcg/min (01/18/11 2030)  . DISCONTD: norepinephrine (LEVOPHED) Adult infusion    . DISCONTD: phenylephrine (NEO-SYNEPHRINE) Adult infusion 150 mcg/min (01/19/11 0414)  . DISCONTD: vasopressin (PITRESSIN) infusion - *FOR SHOCK* Stopped (01/19/11 0820)   PRN Meds:.sodium chloride, acetaminophen, cisatracurium, cisatracurium, fentaNYL, fentaNYL, metoprolol, midazolam, nitroGLYCERIN, ondansetron (ZOFRAN) IV, DISCONTD: cisatracurium, DISCONTD: cisatracurium, DISCONTD:  fentaNYL, DISCONTD: fentaNYL, DISCONTD: midazolam, DISCONTD: midazolam, DISCONTD: midazolam   Ht: 5\' 5"  (165.1 cm)  Wt: 84.4 kg (believed to be more accurate weight)  Ideal Wt: 125 lb (56.8 kg) % Ideal Wt: 148%  BMI: 30.9   Food/Nutrition Related Hx:  Pt intubated and sedated. NG tube placement verified. Temperature: 33.6 C Exhaled MV: 16.6  Labs:   CMP     Component Value Date/Time   NA 136 01/19/2011 1210   K 2.8* 01/19/2011 1210   CL 108 01/19/2011 1210   CO2 14* 01/19/2011 1210   GLUCOSE 368* 01/19/2011 1210   BUN 25* 01/19/2011 1210   CREATININE 0.87 01/19/2011 1210   CALCIUM 6.6* 01/19/2011 1210   PROT 6.4 01/18/2011 1620   ALBUMIN 3.2* 01/18/2011 1620   AST 142* 01/18/2011 1620   ALT 156* 01/18/2011 1620   ALKPHOS 67 01/18/2011 1620   BILITOT 0.3 01/18/2011 1620   GFRNONAA 66* 01/19/2011 1210   GFRAA 76* 01/19/2011 1210    CBG (last 3)   Basename 01/19/11 1200 01/19/11 1102 01/19/11 1002  GLUCAP 335* 416* 403*   I/O last 3 completed shifts: In: 4250.8 [I.V.:3830.8; IV Piggyback:420] Out: 605 [Urine:605] Total I/O In: 1119.6 [I.V.:1119.6] Out: 268 [Urine:268]   Diet Order: NPO   IVF:     amiodarone (CORDARONE) infusion Last Rate: 60 mg/hr (01/18/11 2300)  Followed by   amiodarone (CORDARONE) infusion Last Rate: 15 mg/hr (01/19/11 0820)  cisatracurium (NIMBEX) infusion   DOPamine Last Rate: 5 mcg/kg/min (01/19/11 0800)  fentaNYL infusion INTRAVENOUS Last Rate: 100 mcg/hr (01/18/11 2300)  insulin (NOVOLIN-R) infusion Last Rate: 35.6 Units/hr (01/19/11 1300)  midazolam (VERSED) infusion Last Rate: 2 mg/hr (01/19/11 0230)  norepinephrine (LEVOPHED) Adult infusion Last Rate: 14 mcg/min (01/19/11 1251)  phenylephrine (NEO-SYNEPHRINE) Adult infusion Last Rate: Stopped (01/19/11 0709)  sodium bicarbonate infusion Last Rate: 75 mL/hr at 01/19/11 0700  DISCONTD: sodium chloride Last Rate: 50 mL/hr at 01/18/11 2000  DISCONTD: cisatracurium  (NIMBEX) infusion Last Rate: 1 mcg/kg/min (01/18/11 2300)  DISCONTD: cisatracurium (NIMBEX) infusion   DISCONTD: dextrose   DISCONTD: fentaNYL infusion INTRAVENOUS   DISCONTD: midazolam (VERSED) infusion   DISCONTD: norepinephrine (LEVOPHED) Adult infusion Last Rate: 30 mcg/min (01/18/11 2030)  DISCONTD: norepinephrine (LEVOPHED) Adult infusion   DISCONTD: phenylephrine (NEO-SYNEPHRINE) Adult infusion Last Rate: 150 mcg/min (01/19/11 0414)  DISCONTD: vasopressin (PITRESSIN) infusion - *FOR SHOCK* Last Rate: Stopped (01/19/11 0820)    Estimated Nutritional Needs (based on ASPEN permissive underfeeding guidelines):   Kcal: 1250- 1420 Protein: 110-120 gm Fluid: 1.5- 2.6 L  NUTRITION DIAGNOSIS: -Inadequate oral intake (NI-2.1).  Status: Ongoing  RELATED TO: intubation  AS EVIDENCE BY: NPO status  MONITORING/EVALUATION(Goals): Goal: To meet >90% estimated nutritional needs via nutrition support. Monitor: TF initiation/tolerance/adequacy, weight changes, labs, extubation  EDUCATION NEEDS: -No  education needs identified at this time  INTERVENTION: Recommend Promote to start at 25 mL/hr and increase 10 mL/hr q 4 hours until goal rate of 55 mL/hr with ProStat 64 (30 mL) once daily to provide total 1392 kcal (approximately 100% estimated needs), 97 gm protein (approximately 88% estimated needs), and 1107 mL free water. Also recommend addition of water flushes (150 mL) QID if no IFV.    DOCUMENTATION CODES Per approved criteria  -Obesity Unspecified    Leonette Most 01/19/2011, 1:46 PM

## 2011-01-19 NOTE — Progress Notes (Signed)
Dr Delton Coombes notified that right venous sheath without blood return and unable to flush. Order received to remove venous sheath

## 2011-01-19 NOTE — Progress Notes (Signed)
CRITICAL VALUE ALERT  Critical value received:  K: 2.5  Date of notification:  01/19/11  Time of notification:  2154  Critical value read back:yes  Nurse who received alert:  K. Thad Ranger, RN  MD notified (1st page):  [place]  Time of first page:  2155  MD notified (2nd page):  Time of second page:  Responding MD:  Michele Rockers  Time MD responded:

## 2011-01-19 NOTE — Progress Notes (Signed)
Subjective:  Patient remains intubated sedated unresponsive on multiple pressors  Objective:  Vital Signs in the last 24 hours: Temp:  [89.4 F (31.9 C)-92.5 F (33.6 C)] 91 F (32.8 C) (11/23 1000) Pulse Rate:  [61-115] 71  (11/23 0800) Resp:  [15-20] 16  (11/23 1000) BP: (56-141)/(26-96) 106/75 mmHg (11/23 1000) SpO2:  [99 %-100 %] 100 % (11/23 1000) FiO2 (%):  [49.6 %-100 %] 50 % (11/23 1000) Weight:  [75 kg (165 lb 5.5 oz)-90.9 kg (200 lb 6.4 oz)] 200 lb 6.4 oz (90.9 kg) (11/23 0500)  Intake/Output from previous day: 11/22 0701 - 11/23 0700 In: 4250.8 [I.V.:3830.8; IV Piggyback:420] Out: 605 [Urine:605] Intake/Output from this shift: Total I/O In: 606.6 [I.V.:606.6] Out: 140 [Urine:140]  Physical Exam: General appearance: Intubated sedated Neck: no carotid bruit and no JVD Lungs: diminished breath sounds bilaterally Heart: regular rate and rhythm, S1, S2 normal, no murmur, click, rub or gallop Abdomen: Soft bowel sounds present Extremities: No clubbing cyanosis or edema cool to touch  Lab Results:  Basename 01/18/11 1626 01/18/11 1620  WBC -- 13.5*  HGB 12.6 12.9  PLT -- 350    Basename 01/19/11 0800 01/19/11 0625  NA 134* 133*  K 3.7 4.3  CL 107 108  CO2 12* 9*  GLUCOSE 501* 524*  BUN 25* 25*  CREATININE 0.98 0.94    Basename 01/19/11 0625 01/18/11 2200  TROPONINI 6.84* 3.90*   Hepatic Function Panel  Basename 01/18/11 1620  PROT 6.4  ALBUMIN 3.2*  AST 142*  ALT 156*  ALKPHOS 67  BILITOT 0.3  BILIDIR --  IBILI --    Basename 01/18/11 2000  CHOL 128   No results found for this basename: PROTIME in the last 72 hours  Imaging:   Cardiac Studies:  Assessment/Plan:  Status post out of hospital V. fib cardiac arrest Status post recent inferior wall MI/RV infarct Status post attempted PCI to RCA Vent dependent respiratory failure Hypotensive shock Hypoxic encephalopathy History of hypertension Diabetes mellitus Status post  hypokalemia Hyperthermia Agree with IV fluids and weaning off pressors Follow serial cardiac enzymes Reduce amiodarone dose per orders Evaluate neuro status once off sedation  LOS: 1 day    Rachel Chase N 01/19/2011, 11:14 AM

## 2011-01-19 NOTE — Progress Notes (Signed)
CRITICAL VALUE ALERT  Critical value received:  Calcium 5.9, CK-MB 16.1, Troponin 3.90  Date of notification:  01/18/11  Time of notification:  2322  Critical value read back:yes  Nurse who received alert:  K. Kalman Nylen  MD notified (1st page):  Delford Field   Time of first page:  2345  MD notified (2nd page):  Time of second page:  Responding MD:  Delford Field  Time MD responded:  [phone]

## 2011-01-19 NOTE — Progress Notes (Addendum)
Patient Description   Patient is in 70 year old woman  Adm 11/22 for VF arrest-son gave CPR .Intubated in ER due to agonal breathing-found hypothermic and EKG shows signs of acute IWMI -s/p PTCA to RCA.    Line  Location  Start  Stop   central venous line  ET Tube  right [place] TLC  01/18/2011  01/18/2011     Culture  Date  Result        Antibiotic  Indication  Start  Stop         GI Prophylaxis  DVT Prophylaxis       Protocols  Consultants    Cardiology    Date  Imaging Studies   01/18/11  CT head and cervical spine >>neg    Date  Events   01/18/11 cardiac catheterization >> RCA opened up VT arrest in 2900 >> amio gtt Goal temp time 7pm  11/23 >> 3 pressors, K 6.3, mottled extremities, bicarb gtt for metab acidosis     Physical Examination  Filed Vitals:   01/19/11 0405 01/19/11 0500 01/19/11 0600 01/19/11 0700  BP: 128/76  98/67 123/48  Pulse: 61     Temp:  89.4 F (31.9 C) 89.8 F (32.1 C) 90.9 F (32.7 C)  TempSrc:  Core (Comment) Core (Comment)   Resp:  16 16 15   Height:      Weight:  90.9 kg (200 lb 6.4 oz)    SpO2: 100% 100% 100% 100%    Neuro: Sedated, intubated , at goal temp HEENT: ETT/OGT  Heart: RRR, no M/R/G  Lungs: Bilateral air entry, no W/R/R  Abdomen: Soft, non tender, bowel sounds present  Extremities: Bowel sounds present  Labs  CBC    Component Value Date/Time   WBC 13.5* 01/18/2011 1620   RBC 4.42 01/18/2011 1620   HGB 12.6 01/18/2011 1626   HCT 37.0 01/18/2011 1626   PLT 350 01/18/2011 1620   MCV 86.2 01/18/2011 1620   MCH 29.2 01/18/2011 1620   MCHC 33.9 01/18/2011 1620   RDW 12.9 01/18/2011 1620   LYMPHSABS 4.2* 01/18/2011 1620   MONOABS 1.0 01/18/2011 1620   EOSABS 0.2 01/18/2011 1620   BASOSABS 0.1 01/18/2011 1620    BMET    Component Value Date/Time   NA 133* 01/19/2011 0625   K 4.3 01/19/2011 0625   CL 108 01/19/2011 0625   CO2 9* 01/19/2011 0625   GLUCOSE 524* 01/19/2011 0625   BUN 25* 01/19/2011 0625   CREATININE 0.94 01/19/2011 0625   CALCIUM 6.3* 01/19/2011 0625   GFRNONAA 60* 01/19/2011 0625   GFRAA 70* 01/19/2011 0625    ABG    Component Value Date/Time   PHART 7.248* 01/19/2011 0050   PCO2ART 27.6* 01/19/2011 0050   PO2ART 171.0* 01/19/2011 0050   HCO3 12.6* 01/19/2011 0050   TCO2 14 01/19/2011 0050   ACIDBASEDEF 14.0* 01/19/2011 0050   O2SAT 99.0 01/19/2011 0050      Imaging  01/18/2011 Ct Head and Cervical Spine Wo Contrast  CT HEAD IMPRESSION: Minimal small vessel chronic ischemic changes of deep cerebral white matter. No acute intracranial abnormalities.  CT CERVICAL SPINE IMPRESSION: Degenerative disc disease changes of the cervical spine as above. No acute cervical spine abnormalities.   pCXR 11/22 >>CVL,  ETT in position, no infx  Assessment  1) VDRF: secondary to cardiac arrest, hypothermia.     2) Cardiac Arrest: Likely secondary to acute  IWMI/ RV infarct s/p PTCA to RCA  Plan: ASA, Amio per cards, defer need  for heparin to cards CVP 20 , but will attempt to get off pressors with fluids   3) Hypothermia: Goal temp reached 7pm - rewarming to start at 7pm 11/23  4) Shock: Cardiogenic -No role for vasopressin -Taper levo, once below 10 mcg, taper dopamine to off -Concern for mottled extremities, but pulses ok on doppler  5) Renal: Hypokalemia:  - overshot with repeltion ? False value - rpt now.  Metab acidosis - ct bicarb gtt, rechk ABG pm , if overshoot, stop Rpt ABG shows perssitent acidosis 7.12 range ? DKA ? Lactate ? Isch bowel/ extremities  6) Diabetes mellitus:  Plan:  - ICU hyperglycemia protocol - insulin drip.   7) Best practice     Protonix Lovenox       Family updated in detail , d/w Dr Sharyn Lull The patient is critically ill with multiple organ systems failure and requires high complexity decision making for assessment and support, frequent evaluation and titration of therapies, application of advanced monitoring technologies and  extensive interpretation of multiple databases. Critical Care Time devoted to patient care services described in this note is 90 minutes.   Lyndia Bury V.

## 2011-01-20 ENCOUNTER — Inpatient Hospital Stay (HOSPITAL_COMMUNITY): Payer: Medicare Other

## 2011-01-20 DIAGNOSIS — I469 Cardiac arrest, cause unspecified: Secondary | ICD-10-CM

## 2011-01-20 DIAGNOSIS — Z9911 Dependence on respirator [ventilator] status: Secondary | ICD-10-CM

## 2011-01-20 DIAGNOSIS — J9600 Acute respiratory failure, unspecified whether with hypoxia or hypercapnia: Secondary | ICD-10-CM

## 2011-01-20 LAB — BASIC METABOLIC PANEL
BUN: 24 mg/dL — ABNORMAL HIGH (ref 6–23)
CO2: 18 mEq/L — ABNORMAL LOW (ref 19–32)
CO2: 19 mEq/L (ref 19–32)
Calcium: 7 mg/dL — ABNORMAL LOW (ref 8.4–10.5)
Calcium: 7.5 mg/dL — ABNORMAL LOW (ref 8.4–10.5)
Chloride: 106 mEq/L (ref 96–112)
Chloride: 105 mEq/L (ref 96–112)
Creatinine, Ser: 0.79 mg/dL (ref 0.50–1.10)
Creatinine, Ser: 0.77 mg/dL (ref 0.50–1.10)
Creatinine, Ser: 0.93 mg/dL (ref 0.50–1.10)
Creatinine, Ser: 1.28 mg/dL — ABNORMAL HIGH (ref 0.50–1.10)
GFR calc Af Amer: >90 mL/min (ref >90)
GFR calc Af Amer: 71 mL/min — ABNORMAL LOW (ref >90)
GFR calc Af Amer: 48 mL/min — ABNORMAL LOW (ref >90)
GFR calc non Af Amer: 83 mL/min — ABNORMAL LOW (ref >90)
GFR calc non Af Amer: 41 mL/min — ABNORMAL LOW (ref >90)
Glucose, Bld: 146 mg/dL — ABNORMAL HIGH (ref 70–99)
Potassium: 3 mEq/L — ABNORMAL LOW (ref 3.5–5.1)
Potassium: 3.8 mEq/L (ref 3.5–5.1)

## 2011-01-20 LAB — POCT I-STAT 3, ART BLOOD GAS (G3+)
Acid-base deficit: 2 mmol/L (ref 0.0–2.0)
Bicarbonate: 18.8 mEq/L — ABNORMAL LOW (ref 20.0–24.0)
O2 Saturation: 100 %
Patient temperature: 35.9
Patient temperature: 98.6
TCO2: 20 mmol/L (ref 0–100)
TCO2: 23 mmol/L (ref 0–100)
pCO2 arterial: 39.6 mmHg (ref 35.0–45.0)
pH, Arterial: 7.348 — ABNORMAL LOW (ref 7.350–7.400)
pO2, Arterial: 194 mmHg — ABNORMAL HIGH (ref 80.0–100.0)

## 2011-01-20 LAB — GLUCOSE, CAPILLARY
Glucose-Capillary: 104 mg/dL — ABNORMAL HIGH (ref 70–99)
Glucose-Capillary: 52 mg/dL — ABNORMAL LOW (ref 70–99)
Glucose-Capillary: 54 mg/dL — ABNORMAL LOW (ref 70–99)
Glucose-Capillary: 68 mg/dL — ABNORMAL LOW (ref 70–99)
Glucose-Capillary: 83 mg/dL (ref 70–99)
Glucose-Capillary: 133 mg/dL — ABNORMAL HIGH (ref 70–99)
Glucose-Capillary: 52 mg/dL — ABNORMAL LOW (ref 70–99)
Glucose-Capillary: 90 mg/dL (ref 70–99)
Glucose-Capillary: 54 mg/dL — ABNORMAL LOW (ref 70–99)
Glucose-Capillary: 81 mg/dL (ref 70–99)
Glucose-Capillary: 80 mg/dL (ref 70–99)

## 2011-01-20 LAB — CBC
HCT: 35.6 % — ABNORMAL LOW (ref 36.0–46.0)
MCV: 81.3 fL (ref 78.0–100.0)
Platelets: 274 10*3/uL (ref 150–400)
RBC: 4.38 MIL/uL (ref 3.87–5.11)
RDW: 12.3 % (ref 11.5–15.5)
WBC: 13.3 10*3/uL — ABNORMAL HIGH (ref 4.0–10.5)

## 2011-01-20 MED ORDER — PNEUMOCOCCAL VAC POLYVALENT 25 MCG/0.5ML IJ INJ
0.5000 mL | INJECTION | Freq: Once | INTRAMUSCULAR | Status: DC
  Filled 2011-01-20: qty 0.5

## 2011-01-20 MED ORDER — SODIUM CHLORIDE 0.9 % IV SOLN
2.0000 mg/h | INTRAVENOUS | Status: DC
  Filled 2011-01-20: qty 10

## 2011-01-20 MED ORDER — DEXTROSE 50 % IV SOLN
INTRAVENOUS | Status: AC
  Administered 2011-01-20: 22 mL via INTRAVENOUS
  Administered 2011-01-20: 12 mL via INTRAVENOUS
  Filled 2011-01-20: qty 50

## 2011-01-20 MED ORDER — INSULIN ASPART 100 UNIT/ML ~~LOC~~ SOLN
0.0000 [IU] | SUBCUTANEOUS | Status: DC
  Administered 2011-01-21 (×2): 1 [IU] via SUBCUTANEOUS
  Filled 2011-01-20: qty 3

## 2011-01-20 MED ORDER — FENTANYL CITRATE 0.05 MG/ML IJ SOLN: 50.0000 ug | INTRAMUSCULAR | Status: DC | PRN

## 2011-01-20 MED ORDER — DEXTROSE 50 % IV SOLN
INTRAVENOUS | Status: AC
  Administered 2011-01-20: 19 mL
  Administered 2011-01-20: 22 mL via INTRAVENOUS
  Administered 2011-01-20: 15 mL via INTRAVENOUS
  Filled 2011-01-20: qty 50

## 2011-01-20 MED ORDER — DEXTROSE 50 % IV SOLN
INTRAVENOUS | Status: AC
  Administered 2011-01-20: 25 mL via INTRAVENOUS
  Filled 2011-01-20: qty 50

## 2011-01-20 MED ORDER — DEXTROSE 50 % IV SOLN
INTRAVENOUS | Status: AC
  Administered 2011-01-20: 50 mL via INTRAVENOUS
  Filled 2011-01-20: qty 50

## 2011-01-20 MED ORDER — FENTANYL BOLUS VIA INFUSION: 50.0000 ug | INTRAVENOUS | Status: DC | PRN

## 2011-01-20 MED ORDER — POTASSIUM CHLORIDE 10 MEQ/100ML IV SOLN
10.0000 meq | INTRAVENOUS | Status: AC
  Administered 2011-01-20 (×4): 10 meq via INTRAVENOUS
  Filled 2011-01-20 (×4): qty 100

## 2011-01-20 MED ORDER — DEXTROSE 50 % IV SOLN
INTRAVENOUS | Status: AC
  Administered 2011-01-20: 16 mL via INTRAVENOUS
  Administered 2011-01-20: 24 mL
  Administered 2011-01-20: 18 mL
  Filled 2011-01-20: qty 50

## 2011-01-20 MED ORDER — SODIUM CHLORIDE 0.9 % IV SOLN
50.0000 ug/h | INTRAVENOUS | Status: DC
  Filled 2011-01-20: qty 50

## 2011-01-20 MED ORDER — MIDAZOLAM BOLUS VIA INFUSION
1.0000 mg | INTRAVENOUS | Status: DC | PRN
  Filled 2011-01-20: qty 2

## 2011-01-20 MED ORDER — FENTANYL BOLUS VIA INFUSION
50.0000 ug | Freq: Four times a day (QID) | INTRAVENOUS | Status: DC | PRN
  Filled 2011-01-20: qty 100

## 2011-01-20 NOTE — Progress Notes (Signed)
CBG 69 Treatment: D50 IV 25 mL  Symptoms: None  Follow-up CBG: 1645: CBG Result: 98  Possible Reasons for Event: Unknown  Comments/MD notified: no expected result    Quaid Yeakle, Kinnie Scales

## 2011-01-20 NOTE — Progress Notes (Signed)
Subjective:  Patient awake intubated responds to verbal commands her by open her eyes Patient remains on Levophed with marginal blood pressure dopamine has been weaned off her urine output has been minimal for last few hours  Objective:  Vital Signs in the last 24 hours: Temp:  [90.9 F (32.7 C)-98.6 F (37 C)] 98.6 F (37 C) (11/24 0800) Pulse Rate:  [69-118] 96  (11/24 1215) Resp:  [16-28] 18  (11/24 1215) BP: (73-156)/(39-81) 73/39 mmHg (11/24 1215) SpO2:  [40 %-100 %] 100 % (11/24 1215) FiO2 (%):  [39.8 %-50.3 %] 40 % (11/24 1215) Weight:  [99.8 kg (220 lb 0.3 oz)] 220 lb 0.3 oz (99.8 kg) (11/24 0800)  Intake/Output from previous day: 11/23 0701 - 11/24 0700 In: 4568.1 [I.V.:4168.1; IV Piggyback:400] Out: 806 [Urine:806] Intake/Output from this shift: Total I/O In: 1010.2 [I.V.:710.2; IV Piggyback:300] Out: 55 [Urine:55]  Physical Exam: Neck: no adenopathy, no carotid bruit, no JVD and supple, symmetrical, trachea midline Lungs: diminished breath sounds bibasilar Heart: regular rate and rhythm, S1, S2 normal, no murmur, click, rub or gallop Abdomen: soft, non-tender; bowel sounds normal; no masses,  no organomegaly Extremities: extremities normal, atraumatic, no cyanosis or edema Her right groin stable with no evidence of hematoma a line still in place Lab Results:  Basename 01/20/11 0430 01/18/11 1626 01/18/11 1620  WBC 13.3* -- 13.5*  HGB 12.5 12.6 --  PLT 274 -- 350    Basename 01/20/11 0910 01/20/11 0430  NA 135 137  K 3.0* 2.4*  CL 105 106  CO2 19 18*  GLUCOSE 126* 146*  BUN 23 23  CREATININE 0.93 0.79    Basename 01/19/11 1600 01/19/11 0625  TROPONINI 9.50* 6.84*   Hepatic Function Panel  Basename 01/18/11 1620  PROT 6.4  ALBUMIN 3.2*  AST 142*  ALT 156*  ALKPHOS 67  BILITOT 0.3  BILIDIR --  IBILI --    Basename 01/18/11 2000  CHOL 128   No results found for this basename: PROTIME in the last 72 hours  Imaging:   Cardiac  Studies:  Assessment/Plan:  Status post out of hospital V. fib arrest Status post recent inferior wall/RV infarct Attempted PCI to a RCA Hypotensive shock Vent dependent respiratory failure Diabetes mellitus Resolving I put kalemia Status post paroxysmal A. fib and V. fib Plan The IV fluid normal saline 500 cc bolus over 2 hours Replace K. DC amiodarone We'll start beta blockers when patient is off inotropes Check labs in a.m.  LOS: 2 days    Kendarius Vigen N 01/20/2011, 12:23 PM

## 2011-01-20 NOTE — Progress Notes (Signed)
CRITICAL VALUE ALERT  Critical value received:  K: 2.4  Date of notification:  01/20/11  Time of notification:  0136  Critical value read back:yes  Nurse who received alert:  K. Thad Ranger, RN  MD notified (1st page):  [place]  Time of first page:  0138  MD notified (2nd page):  Time of second page:  Responding MD:  Michele Rockers  Time MD responded:

## 2011-01-20 NOTE — Progress Notes (Signed)
Patient Description   Patient is in 70 year old woman  Adm 11/22 for VF arrest-son gave CPR .Intubated in ER due to agonal breathing. EKG shows signs of acute IWMI -s/p PTCA to RCA. Completed [facility] protocol 11/24 AM   Line  Location  Start  Stop   central venous line  ET Tube  right [place] TLC  01/18/2011 01/18/2011     Culture  Date  Result        Antibiotic  Indication  Start  Stop         GI Prophylaxis  DVT Prophylaxis   Protonix 11/22 >>  LMWH 11/23 >> SCDs 11/22 >>   Protocols  Consultants   [facility] - Completed 11/24 AM Cardiology    Date  Imaging Studies   01/18/11  CT head and cervical spine >>neg    Date  Events   01/18/11 cardiac catheterization >> RCA opened up VT arrest in 2900 >> amio gtt     Physical Examination  Filed Vitals:   01/20/11 0600 01/20/11 0700 01/20/11 0733 01/20/11 0800  BP: 97/47 89/51 129/75   Pulse:   113 118  Temp: 97.3 F (36.3 C) 98.1 F (36.7 C)  98.6 F (37 C)  TempSrc: Core (Comment) Core (Comment)    Resp: 26 28 28    Height:      Weight:    99.8 kg (220 lb 0.3 oz)  SpO2: 100% 100% 100%     Neuro: RASS -1. Agitated @ times. + F/C HEENT: ETT/OGT  Heart: RRR, no M/R/G  Lungs: Clear anteriorly  Abdomen: Soft, non tender, bowel sounds present  Extremities: R hand edema Labs  CBC    Component Value Date/Time   WBC 13.3* 01/20/2011 0430   RBC 4.38 01/20/2011 0430   HGB 12.5 01/20/2011 0430   HCT 35.6* 01/20/2011 0430   PLT 274 01/20/2011 0430   MCV 81.3 01/20/2011 0430   MCH 28.5 01/20/2011 0430   MCHC 35.1 01/20/2011 0430   RDW 12.3 01/20/2011 0430   LYMPHSABS 4.2* 01/18/2011 1620   MONOABS 1.0 01/18/2011 1620   EOSABS 0.2 01/18/2011 1620   BASOSABS 0.1 01/18/2011 1620    BMET    Component Value Date/Time   NA 137 01/20/2011 0430   K 2.4* 01/20/2011 0430   CL 106 01/20/2011 0430   CO2 18* 01/20/2011 0430   GLUCOSE 146* 01/20/2011 0430   BUN 23 01/20/2011 0430   CREATININE 0.79 01/20/2011 0430   CALCIUM  7.0* 01/20/2011 0430   GFRNONAA 82* 01/20/2011 0430   GFRAA >90 01/20/2011 0430    ABG    Component Value Date/Time   PHART 7.531* 01/20/2011 0440   PCO2ART 22.3* 01/20/2011 0440   PO2ART 194.0* 01/20/2011 0440   HCO3 18.8* 01/20/2011 0440   TCO2 20 01/20/2011 0440   ACIDBASEDEF 2.0 01/20/2011 0440   O2SAT 100.0 01/20/2011 0440      Imaging  01/18/2011 Ct Head and Cervical Spine Wo Contrast  CT HEAD IMPRESSION: Minimal small vessel chronic ischemic changes of deep cerebral white matter. No acute intracranial abnormalities.  CT CERVICAL SPINE IMPRESSION: Degenerative disc disease changes of the cervical spine as above. No acute cervical spine abnormalities.   pCXR 11/22 >>CVL,  ETT in position, no infx  Assessment  1) VDRF: Vent changes made. Metabolic acidosis improving    2) Cardiac Arrest, Cardiogenic shock: VF arrest secondary to IWMI. Still requires pressors. Therefore cannot start beta blocker. CVP elevated but no evidence of frank plum edema. No  diuresis. Titrate pressors to maintain MAP > 65 mmHg Plan: Cont ASA, Amio per cards. D/C Dopamine. NE to maintain MAP > 65 mmHg  3) Hypothermia: resolved  4) Renal: Hypokalemia:  Repleted this AM. Should cont to improve with rewarming  5) Metabolic acidosis: HCO3 gtt adjusted  6) Hyperglycemia - resolved. No prior hx of DM. Now hypoglycemic. Cont dextrose in IVFs        Family updated in detail   The patient is critically ill with multiple organ systems failure and requires high complexity decision making for assessment and support, frequent evaluation and titration of therapies, application of advanced monitoring technologies and extensive interpretation of multiple databases. Critical Care Time devoted to patient care services described in this note is 60 minutes.   Billy Fischer

## 2011-01-20 NOTE — Progress Notes (Signed)
CRITICAL VALUE ALERT  Critical value received:  K: 2.4  Date of notification:  11/24  Time of notification:  0538  Critical value read back:yes  Nurse who received alert:  K. Thad Ranger, RN  MD notified (1st page):  [place]  Time of first page:  [phone]  MD notified (2nd page):  Time of second page:  Responding MD:  Michele Rockers  Time MD responded:

## 2011-01-21 ENCOUNTER — Inpatient Hospital Stay (HOSPITAL_COMMUNITY): Payer: Medicare Other

## 2011-01-21 LAB — BLOOD GAS, ARTERIAL
Acid-Base Excess: 1.4 mmol/L (ref 0.0–2.0)
Bicarbonate: 25.8 mEq/L — ABNORMAL HIGH (ref 20.0–24.0)
FIO2: 0.4 %
O2 Saturation: 75.1 %
pO2, Arterial: 40.1 mmHg — ABNORMAL LOW (ref 80.0–100.0)

## 2011-01-21 LAB — BASIC METABOLIC PANEL
BUN: 23 mg/dL (ref 6–23)
Calcium: 6.6 mg/dL — ABNORMAL LOW (ref 8.4–10.5)
Creatinine, Ser: 1.02 mg/dL (ref 0.50–1.10)
GFR calc Af Amer: 63 mL/min — ABNORMAL LOW (ref >90)
GFR calc non Af Amer: 54 mL/min — ABNORMAL LOW (ref >90)

## 2011-01-21 LAB — CBC
MCHC: 33.9 g/dL (ref 30.0–36.0)
Platelets: 177 10*3/uL (ref 150–400)
RDW: 13.2 % (ref 11.5–15.5)

## 2011-01-21 LAB — CARDIAC PANEL(CRET KIN+CKTOT+MB+TROPI)
Total CK: 616 U/L — ABNORMAL HIGH (ref 7–177)
Troponin I: 10.11 ng/mL (ref <0.30)

## 2011-01-21 LAB — POCT I-STAT 3, ART BLOOD GAS (G3+)
Acid-Base Excess: 5 mmol/L — ABNORMAL HIGH (ref 0.0–2.0)
O2 Saturation: 100 %
pO2, Arterial: 173 mmHg — ABNORMAL HIGH (ref 80.0–100.0)

## 2011-01-21 LAB — GLUCOSE, CAPILLARY
Glucose-Capillary: 115 mg/dL — ABNORMAL HIGH (ref 70–99)
Glucose-Capillary: 126 mg/dL — ABNORMAL HIGH (ref 70–99)

## 2011-01-21 MED ORDER — METOPROLOL TARTRATE 1 MG/ML IV SOLN
2.5000 mg | Freq: Four times a day (QID) | INTRAVENOUS | Status: DC
  Administered 2011-01-21 – 2011-01-22 (×5): 2.5 mg via INTRAVENOUS
  Filled 2011-01-21 (×8): qty 5

## 2011-01-21 MED ORDER — INSULIN ASPART 100 UNIT/ML ~~LOC~~ SOLN: 0.0000 [IU] | SUBCUTANEOUS | Status: DC

## 2011-01-21 MED ORDER — DEXTROSE 10 % IV SOLN: INTRAVENOUS | Status: DC

## 2011-01-21 MED ORDER — METHYLPREDNISOLONE SODIUM SUCC 125 MG IJ SOLR
80.0000 mg | Freq: Once | INTRAMUSCULAR | Status: AC
  Administered 2011-01-21: 80 mg via INTRAVENOUS
  Filled 2011-01-21: qty 1.28

## 2011-01-21 MED ORDER — ATROPINE SULFATE 1 MG/ML IJ SOLN
INTRAMUSCULAR | Status: AC
  Filled 2011-01-21: qty 1

## 2011-01-21 MED ORDER — LEVALBUTEROL HCL 0.63 MG/3ML IN NEBU
0.6300 mg | INHALATION_SOLUTION | Freq: Four times a day (QID) | RESPIRATORY_TRACT | Status: DC
  Administered 2011-01-21: 0.63 mg via RESPIRATORY_TRACT
  Filled 2011-01-21 (×4): qty 3

## 2011-01-21 MED ORDER — LEVALBUTEROL HCL 0.63 MG/3ML IN NEBU
0.6300 mg | INHALATION_SOLUTION | RESPIRATORY_TRACT | Status: DC | PRN
  Administered 2011-01-21 – 2011-01-25 (×8): 0.63 mg via RESPIRATORY_TRACT
  Filled 2011-01-21 (×2): qty 3

## 2011-01-21 NOTE — Progress Notes (Signed)
Right groin sheath removed per orders and manual pressure held to site for 20 minutes. Patient tolerated well and gauze applied to site and secured with tegaderm. No active bleeding or hematoma noted to site. Site is Level 0 and provided instructions to patient on leg restrictions and when to notify RN of pain or warmth to area. Patient verbalized understanding. Night shift nurse Felicity Coyer. RN visualized site and palpated to verify condition of site. Dressing to site remains clean, dry, and intact with no active bleeding noted.

## 2011-01-21 NOTE — Progress Notes (Signed)
Patient Description   Patient is in 70 year old woman  Adm 11/22 for VF arrest-son gave CPR .Intubated in ER due to agonal breathing. EKG shows signs of acute IWMI -s/p PTCA to RCA. Completed [facility] protocol 11/24 AM   Line  Location  Start  Stop   central venous line  ET Tube  right [place] TLC  01/18/2011 01/18/2011   11/24   Culture  Date  Result        Antibiotic  Indication  Start  Stop         GI Prophylaxis  DVT Prophylaxis   Protonix 11/22 >> 11/24 LMWH 11/23 >> SCDs 11/22 >> 11/24   Protocols  Consultants   [facility] - Completed 11/24 AM Cardiology    Date  Imaging Studies   01/18/11  CT head and cervical spine >>neg    Date  Events   01/18/11 cardiac catheterization >> RCA opened up VT arrest in 2900 >> amio gtt     Physical Examination  Filed Vitals:   01/21/11 1000 01/21/11 1100 01/21/11 1200 01/21/11 1300  BP: 107/54 98/53 104/55 100/55  Pulse: 115 110 109 97  Temp:   97.4 F (36.3 C)   TempSrc:   Oral   Resp: 22 15 17 18   Height:      Weight:      SpO2: 97% 99% 99% 100%    Neuro: RASS 0. Calm. + F/C HEENT: ETT/OGT  Heart: RRR, no M/R/G  Lungs: Clear anteriorly  Abdomen: Soft, non tender, bowel sounds present  Extremities: R hand edema Labs  CBC    Component Value Date/Time   WBC 11.4* 01/21/2011 0345   RBC 3.61* 01/21/2011 0345   HGB 10.4* 01/21/2011 0345   HCT 30.7* 01/21/2011 0345   PLT 177 01/21/2011 0345   MCV 85.0 01/21/2011 0345   MCH 28.8 01/21/2011 0345   MCHC 33.9 01/21/2011 0345   RDW 13.2 01/21/2011 0345   LYMPHSABS 4.2* 01/18/2011 1620   MONOABS 1.0 01/18/2011 1620   EOSABS 0.2 01/18/2011 1620   BASOSABS 0.1 01/18/2011 1620    BMET    Component Value Date/Time   NA 136 01/21/2011 0345   K 3.7 01/21/2011 0345   CL 99 01/21/2011 0345   CO2 29 01/21/2011 0345   GLUCOSE 131* 01/21/2011 0345   BUN 23 01/21/2011 0345   CREATININE 1.02 01/21/2011 0345   CALCIUM 6.6* 01/21/2011 0345   GFRNONAA 54* 01/21/2011 0345   GFRAA 63* 01/21/2011 0345    ABG    Component Value Date/Time   PHART 7.381 01/21/2011 0359   PCO2ART 44.9 01/21/2011 0359   PO2ART 40.1* 01/21/2011 0359   HCO3 25.8* 01/21/2011 0359   TCO2 27.2 01/21/2011 0359   ACIDBASEDEF 4.0* 01/20/2011 1220   O2SAT 75.1 01/21/2011 0359      Imaging  01/18/2011 Ct Head and Cervical Spine Wo Contrast  CT HEAD IMPRESSION: Minimal small vessel chronic ischemic changes of deep cerebral white matter. No acute intracranial abnormalities.  CT CERVICAL SPINE IMPRESSION: Degenerative disc disease changes of the cervical spine as above. No acute cervical spine abnormalities.   pCXR 11/25 >> Bibasilar opacities > atx vs infiltrates   Assessment  1) VDRF: Passed SBT. Now extubated. Mild to moderate post extubation stridor > solumedrol X 1 dose. Cont BDs.     2) Cardiac Arrest, Cardiogenic shock: VF arrest secondary to IWMI. Cont to wean pressors for MAP > 65 mmHg.   3) Hypothermia: resolved  4) Renal: Hypokalemia:  Resolved  5) Metabolic acidosis: resolved. D/C HCO3 gtt  6) Hyperglycemia - resolved. No prior hx of DM. D/C SSI        Family updated in detail   The patient is critically ill with multiple organ systems failure and requires high complexity decision making for assessment and support, frequent evaluation and titration of therapies, application of advanced monitoring technologies and extensive interpretation of multiple databases. Critical Care Time devoted to patient care services described in this note is 30 minutes.   Billy Fischer

## 2011-01-21 NOTE — Progress Notes (Signed)
Subjective:  The patient awake the extubated successfully earlier today Denies any chest pain or shortness of breath Her complaints of soreness in throat secondary to endotracheal intubation Patient is off amiodarone and liver felt No further episodes of A. fib or V. fib  Objective:  Vital Signs in the last 24 hours: Temp:  [98.7 F (37.1 C)-99.7 F (37.6 C)] 98.7 F (37.1 C) (11/25 0800) Pulse Rate:  [92-117] 110  (11/25 1100) Resp:  [10-27] 15  (11/25 1100) BP: (72-130)/(24-86) 98/53 mmHg (11/25 1100) SpO2:  [97 %-100 %] 99 % (11/25 1100) FiO2 (%):  [39.7 %-100 %] 39.9 % (11/25 0900) Weight:  [101.7 kg (224 lb 3.3 oz)] 224 lb 3.3 oz (101.7 kg) (11/25 0500)  Intake/Output from previous day: 11/24 0701 - 11/25 0700 In: 5798.7 [I.V.:5498.7; IV Piggyback:300] Out: 469 [Urine:469] Intake/Output from this shift: Total I/O In: 461.4 [I.V.:461.4] Out: 190 [Urine:190]  Physical Exam: Neck: no adenopathy, no carotid bruit, no JVD and supple, symmetrical, trachea midline Lungs: diminished breath sounds bibasilar Heart: regular rate and rhythm and no S3 or S4 Abdomen: soft, non-tender; bowel sounds normal; no masses,  no organomegaly Extremities: edema 1+ edema  Lab Results:  Basename 01/21/11 0345 01/20/11 0430  WBC 11.4* 13.3*  HGB 10.4* 12.5  PLT 177 274    Basename 01/21/11 0345 01/20/11 1617  NA 136 135  K 3.7 3.8  CL 99 103  CO2 29 23  GLUCOSE 131* 127*  BUN 23 24*  CREATININE 1.02 1.28*    Basename 01/21/11 0001 01/19/11 1600  TROPONINI 10.11* 9.50*   Hepatic Function Panel  Basename 01/18/11 1620  PROT 6.4  ALBUMIN 3.2*  AST 142*  ALT 156*  ALKPHOS 67  BILITOT 0.3  BILIDIR --  IBILI --    Basename 01/18/11 2000  CHOL 128   No results found for this basename: PROTIME in the last 72 hours  Imaging:  Cardiac Studies:  Assessment/Plan:  Status post out of hospital V. fib arrest Status post inferior wall MI/ RV infarct Status post hypotensive  shock Status post when dependent respiratory failure Hypertension Diabetes mellitus Status post hypokalemia Plan Agree with IV  Lopressor until able to tolerate by mouth DCA line if okay with the CCM and if remains hemodynamically stable Continue present management per CCM  LOS: 3 days     Rachel Chase 01/21/2011, 12:08 PM

## 2011-01-21 NOTE — Progress Notes (Signed)
Fentanyl discontinued and 200cc wasted in sink with second nurse witness Janne Napoleon RN.

## 2011-01-21 NOTE — Plan of Care (Signed)
Problem: Phase II Progression Outcomes Goal: Hemodynamically stable Outcome: Progressing Patient extubated today and making great improvements. Will continue to monitor.  Goal: Extubated and maintains O2 sats > 92% Outcome: Completed/Met Date Met:  01/21/11 Patient extubated this AM and is tolerating well. Will continue to monitor.

## 2011-01-21 NOTE — Procedures (Signed)
Extubation Procedure Note  Patient Details:   Name: KONI KANNAN DOB: 09-16-1940 MRN: [id number]   Airway Documentation:  AIRWAYS 7 mm (Active)  Secured at (cm) 24 cm 01/18/2011 12:00 AM     Airway 7 mm (Active)  Secured at (cm) 23 cm 01/21/2011  8:41 AM  Measured From Lips 01/21/2011  8:41 AM  Secured Location Right 01/21/2011  8:41 AM  Secured By [employer] 01/21/2011  8:41 AM  Tube Holder Repositioned Yes 01/21/2011  8:41 AM  Cuff Pressure (cm H2O) 24 cm H2O 01/20/2011  8:00 PM  Site Condition Dry 01/21/2011  8:00 AM    Evaluation  O2 sats: stable throughout Complications: No apparent complications Patient did tolerate procedure well. Bilateral Breath Sounds: Diminished;Clear Suctioning: Airway Yes  Dorethea Clan 01/21/2011, 9:34 AM

## 2011-01-22 DIAGNOSIS — I469 Cardiac arrest, cause unspecified: Secondary | ICD-10-CM

## 2011-01-22 DIAGNOSIS — R0902 Hypoxemia: Secondary | ICD-10-CM

## 2011-01-22 DIAGNOSIS — J9600 Acute respiratory failure, unspecified whether with hypoxia or hypercapnia: Secondary | ICD-10-CM

## 2011-01-22 LAB — POCT I-STAT 3, ART BLOOD GAS (G3+)
Bicarbonate: 24.4 mEq/L — ABNORMAL HIGH (ref 20.0–24.0)
O2 Saturation: 100 %
TCO2: 26 mmol/L (ref 0–100)
pCO2 arterial: 50.4 mmHg — ABNORMAL HIGH (ref 35.0–45.0)
pH, Arterial: 7.293 — ABNORMAL LOW (ref 7.350–7.400)

## 2011-01-22 LAB — GLUCOSE, CAPILLARY: Glucose-Capillary: 105 mg/dL — ABNORMAL HIGH (ref 70–99)

## 2011-01-22 LAB — POCT ACTIVATED CLOTTING TIME: Activated Clotting Time: 0 seconds

## 2011-01-22 MED ORDER — LEVALBUTEROL HCL 0.63 MG/3ML IN NEBU
0.6300 mg | INHALATION_SOLUTION | Freq: Four times a day (QID) | RESPIRATORY_TRACT | Status: DC
  Administered 2011-01-22: 0.63 mg via RESPIRATORY_TRACT
  Filled 2011-01-22 (×6): qty 3

## 2011-01-22 MED ORDER — LEVALBUTEROL HCL 0.63 MG/3ML IN NEBU
0.6300 mg | INHALATION_SOLUTION | Freq: Three times a day (TID) | RESPIRATORY_TRACT | Status: DC
  Administered 2011-01-22 – 2011-01-26 (×14): 0.63 mg via RESPIRATORY_TRACT
  Filled 2011-01-22 (×21): qty 3

## 2011-01-22 MED ORDER — METOPROLOL TARTRATE 12.5 MG HALF TABLET
12.5000 mg | ORAL_TABLET | Freq: Two times a day (BID) | ORAL | Status: DC
  Administered 2011-01-22 – 2011-01-24 (×5): 12.5 mg via ORAL
  Filled 2011-01-22 (×7): qty 1

## 2011-01-22 MED ORDER — ZOLPIDEM TARTRATE 5 MG PO TABS
5.0000 mg | ORAL_TABLET | Freq: Every evening | ORAL | Status: DC | PRN
  Administered 2011-01-22 – 2011-01-29 (×7): 5 mg via ORAL
  Filled 2011-01-22 (×8): qty 1

## 2011-01-22 MED ORDER — ASPIRIN 81 MG PO CHEW
CHEWABLE_TABLET | ORAL | Status: AC
  Filled 2011-01-22: qty 1

## 2011-01-22 MED ORDER — METOPROLOL TARTRATE 12.5 MG HALF TABLET
12.5000 mg | ORAL_TABLET | Freq: Two times a day (BID) | ORAL | Status: DC
  Filled 2011-01-22 (×2): qty 1

## 2011-01-22 MED ORDER — ROSUVASTATIN CALCIUM 20 MG PO TABS
20.0000 mg | ORAL_TABLET | Freq: Every day | ORAL | Status: DC
  Administered 2011-01-22 – 2011-01-30 (×9): 20 mg via ORAL
  Filled 2011-01-22 (×10): qty 1

## 2011-01-22 MED ORDER — ASPIRIN EC 81 MG PO TBEC
81.0000 mg | DELAYED_RELEASE_TABLET | Freq: Every day | ORAL | Status: DC
  Administered 2011-01-22 – 2011-01-31 (×10): 81 mg via ORAL
  Filled 2011-01-22 (×10): qty 1

## 2011-01-22 NOTE — Progress Notes (Signed)
Subjective:  Patient denies any chest pain or shortness of breath more awake today  Objective:  Vital Signs in the last 24 hours: Temp:  [97.3 F (36.3 C)-98.6 F (37 C)] 97.3 F (36.3 C) (11/26 1200) Pulse Rate:  [88-103] 95  (11/26 1200) Resp:  [10-24] 17  (11/26 1200) BP: (69-134)/(51-84) 123/65 mmHg (11/26 1200) SpO2:  [95 %-100 %] 99 % (11/26 1200) FiO2 (%):  [40 %] 40 % (11/25 1500) Weight:  [101 kg (222 lb 10.6 oz)] 222 lb 10.6 oz (101 kg) (11/26 0459)  Intake/Output from previous day: 11/25 0701 - 11/26 0700 In: 861.4 [I.V.:861.4] Out: 1570 [Urine:1570] Intake/Output from this shift: Total I/O In: 100 [I.V.:100] Out: 430 [Urine:430]  Physical Exam: Neck: no adenopathy, no carotid bruit, no JVD and supple, symmetrical, trachea midline Lungs: diminished breath sounds bibasilar and Faint expiratory wheezing Heart: regular rate and rhythm, S1, S2 normal, no murmur, click, rub or gallop Abdomen: soft, non-tender; bowel sounds normal; no masses,  no organomegaly Extremities: edema 1+ edema  Lab Results:  Basename 01/21/11 0345 01/20/11 0430  WBC 11.4* 13.3*  HGB 10.4* 12.5  PLT 177 274    Basename 01/21/11 0345 01/20/11 1617  NA 136 135  K 3.7 3.8  CL 99 103  CO2 29 23  GLUCOSE 131* 127*  BUN 23 24*  CREATININE 1.02 1.28*    Basename 01/21/11 0001 01/19/11 1600  TROPONINI 10.11* 9.50*   Hepatic Function Panel No results found for this basename: PROT,ALBUMIN,AST,ALT,ALKPHOS,BILITOT,BILIDIR,IBILI in the last 72 hours No results found for this basename: CHOL in the last 72 hours No results found for this basename: PROTIME in the last 72 hours  Imaging: Cardiac Studies:  Assessment/Plan:  Status post out of hospital V. fib arrest Status post inferior wall MI/RV infarct status post attempted PCI to RCA Status post hypotensive shock Status post vent dependent respiratory failure Hypertension Diabetes mellitus Hypercholesteremia Plan Start low-dose  beta blockers and statins OT PT consult   Phase I cardiac rehabilitation Out of bed as tolerated   LOS: 4 days    Korynn Kenedy N 01/22/2011, 12:27 PM

## 2011-01-22 NOTE — Progress Notes (Signed)
Rachel Chase is a 70 y.o. female former smoker admitted on 01/18/2011 with cardiac arrest 2nd to VT with Rt coronary MI s/p hypothermia protocol. PMHx HTN, COPD, Arthritis, Anxiety, Chronic back pain  Line/tube: ETT 11/22>>11/24 Rt [place] CVL 11/22>>  Tests/events: 11/22 Cardiac cath>>100% RCA occlusion with type 2 dissection after attempted PCI 11/22 CT head/neck>>minimal small vessel chronic ischemic changes. DJD of C-spine 11/22 Echo>>EF 50 to 55%  SUBJECTIVE: Feels weak.  Has occasional cough with chest congestion and wheeze.  Denies chest/abd pain.  OBJECTIVE:  Blood pressure 115/59, pulse 88, temperature 98.6 F (37 C), temperature source Oral, resp. rate 17, height 5\' 5"  (1.651 m), weight 222 lb 10.6 oz (101 kg), SpO2 100.00%.   Intake/Output Summary (Last 24 hours) at 01/22/11 1027 Last data filed at 01/22/11 0800  Gross per 24 hour  Intake    440 ml  Output   1580 ml  Net  -1140 ml    General - obese, no distress HEENT - no oral exudate Cardiac - s1s2 regular, no murmur Chest - diminished breath sounds, no wheeze/rales Abd - soft, nontender, normal bowel sounds Ext - no edema GU - foley in place Neuro - A&O x 3  Lab Results  Component Value Date   WBC 11.4* 01/21/2011   HGB 10.4* 01/21/2011   HCT 30.7* 01/21/2011   MCV 85.0 01/21/2011   PLT 177 01/21/2011   Lab Results  Component Value Date   CREATININE 1.02 01/21/2011   BUN 23 01/21/2011   NA 136 01/21/2011   K 3.7 01/21/2011   CL 99 01/21/2011   CO2 29 01/21/2011     Dg Chest Port 1 View  01/21/2011  *RADIOLOGY REPORT*  Clinical Data: Respiratory failure.  PORTABLE CHEST - 1 VIEW  Comparison: 01/20/2011.  Findings: Endotracheal tube tip 5 cm above the carina.  Right central line tip proximal superior vena cava level. Nasogastric tube courses below the diaphragm.  The tip is not included on this exam.  Bibasilar air space disease.  Central pulmonary vascular prominence.  Heart size top normal.   Calcified aorta.  No gross pneumothorax.  IMPRESSION: Bibasilar air space disease.  Central pulmonary vascular prominence.  Original Report Authenticated By: Fuller Canada, M.D.    ASSESSMENT/PLAN:  Cardiac arrest in setting of acute MI with VF/VT arrest with cardiogenic shock with Hx of HTN -shock resolved -keep CVL in today, and then re-assess for d/c 11/27 -will d/c CVP monitoring -meds per cardiology -completed hypothermia protocol 11/24  VDRF in setting of cardiac arrest -resolved -required one dose of solumedrol after extubation for stridor  Hx of smoking with ?COPD -continue bronchodilators -will need further assessment as outpt  Atelectasis -f/u CXR -bronchial hygiene  Hyperglycemia  -monitor CBG while advancing diet and then re-assess for DM  Anemia -follow up CBC  Debility -OOB to chair, PT evaluation -keep foley in until assessed by PT  Disposition -will change to SDU status  Rachel Chase Pager:  [phone] 01/22/2011, 10:27 AM

## 2011-01-22 NOTE — Consult Note (Signed)
WOC consult Note Reason for Consult: Consult requested for right arm. Wound type: Partial thickness patchy areas of skin loss. Pt had previous suspected IV infiltrate of unknown medication.  This resulted in generalized edema to right hand and arm and subsequent blistering which has ruptured and evolved into partial thickness skin loss areas. Measurement: Affected area 10X10, depth of patchy areas is .1cm Wound bed: Red and dry without odor, small yellow drainage. Dressing procedure/placement/frequency: Foam dressing to protect and promote healing. Will not plan to follow further unless re-consulted.  [address] RN, MSN, [employer]  [phone]

## 2011-01-23 ENCOUNTER — Inpatient Hospital Stay (HOSPITAL_COMMUNITY): Payer: Medicare Other

## 2011-01-23 DIAGNOSIS — J9600 Acute respiratory failure, unspecified whether with hypoxia or hypercapnia: Secondary | ICD-10-CM

## 2011-01-23 DIAGNOSIS — R5381 Other malaise: Secondary | ICD-10-CM

## 2011-01-23 DIAGNOSIS — I219 Acute myocardial infarction, unspecified: Secondary | ICD-10-CM

## 2011-01-23 DIAGNOSIS — R0902 Hypoxemia: Secondary | ICD-10-CM

## 2011-01-23 LAB — CBC
HCT: 28 % — ABNORMAL LOW (ref 36.0–46.0)
Hemoglobin: 9.3 g/dL — ABNORMAL LOW (ref 12.0–15.0)
MCH: 28.5 pg (ref 26.0–34.0)
MCHC: 33.2 g/dL (ref 30.0–36.0)
MCV: 85.9 fL (ref 78.0–100.0)

## 2011-01-23 LAB — BASIC METABOLIC PANEL
BUN: 25 mg/dL — ABNORMAL HIGH (ref 6–23)
CO2: 29 mEq/L (ref 19–32)
Chloride: 103 mEq/L (ref 96–112)
Creatinine, Ser: 0.84 mg/dL (ref 0.50–1.10)
Glucose, Bld: 100 mg/dL — ABNORMAL HIGH (ref 70–99)
Potassium: 3.7 mEq/L (ref 3.5–5.1)

## 2011-01-23 MED ORDER — POTASSIUM CITRATE-CITRIC ACID 1100-334 MG/5ML PO SOLN
10.0000 meq | Freq: Once | ORAL | Status: DC
Start: 2011-01-23 — End: 2011-01-23

## 2011-01-23 MED ORDER — LORAZEPAM 1 MG PO TABS
1.0000 mg | ORAL_TABLET | Freq: Once | ORAL | Status: AC
  Administered 2011-01-23: 1 mg via ORAL
  Filled 2011-01-23: qty 1

## 2011-01-23 MED ORDER — POTASSIUM CITRATE-CITRIC ACID 1100-334 MG/5ML PO SOLN
10.0000 meq | Freq: Once | ORAL | Status: DC
  Filled 2011-01-23: qty 5

## 2011-01-23 MED ORDER — CITRIC ACID-SODIUM CITRATE 334-500 MG/5ML PO SOLN
30.0000 mL | Freq: Once | ORAL | Status: DC
  Filled 2011-01-23 (×2): qty 30

## 2011-01-23 NOTE — Progress Notes (Signed)
MD pt may benefit from OT consult as well as Inpatient Rehab consult for further strengthening.  Thanks!! Chena [place], PT DPT [phone]

## 2011-01-23 NOTE — Progress Notes (Signed)
Physical Therapy Evaluation Patient Details Name: Rachel Chase MRN: [id number] DOB: December 08, 1940 Today's Date: 01/23/2011  Problem List: There is no problem list on file for this patient.   Past Medical History:  Past Medical History  Diagnosis Date  . Hypertension   . Back pain     Lower Back pain - takes Cortisone shots  . Shortness of breath   . Pneumonia   . Arthritis   . Anxiety    Past Surgical History:  Past Surgical History  Procedure Date  . Abdominal hysterectomy   . Eye surgery     Laser Eye Surgery approx 2007    PT Assessment/Plan/Recommendation PT Assessment Clinical Impression Statement: Pt presented to therapy with generalized weakness and impaired cardiopulmonary status limiting activity.  Pt was pleasant, but very restless during treatment session, requiring multiple verbal cues and step-by-step instructions to complete tasks.  Pt would benefit from therapy in the acute setting to maximize independence and increase functional mobility and endurance to prepare for safe d/c to next venue. PT Recommendation/Assessment: Patient will need skilled PT in the acute care venue PT Problem List: Decreased strength;Decreased activity tolerance;Decreased balance;Decreased mobility;Decreased cognition;Decreased knowledge of use of DME;Decreased safety awareness;Decreased knowledge of precautions;Cardiopulmonary status limiting activity Barriers to Discharge: None PT Therapy Diagnosis : Difficulty walking;Abnormality of gait;Generalized weakness;Altered mental status PT Plan PT Frequency: Min 3X/week PT Treatment/Interventions: DME instruction;Gait training;Stair training;Functional mobility training;Therapeutic exercise;Balance training;Patient/family education PT Recommendation Recommendations for Other Services: Rehab consult;OT consult Follow Up Recommendations: Inpatient Rehab Equipment Recommended: Defer to next venue PT Goals  Acute Rehab PT Goals PT Goal  Formulation: With patient/family Time For Goal Achievement: 2 weeks Pt will Roll Supine to Right Side: with modified independence PT Goal: Rolling Supine to Right Side - Progress: Progressing toward goal Pt will go Supine/Side to Sit: with modified independence PT Goal: Supine/Side to Sit - Progress: Progressing toward goal Pt will go Sit to Supine/Side: with modified independence PT Goal: Sit to Supine/Side - Progress: Other (comment) (Not assessed) Pt will Transfer Sit to Stand/Stand to Sit: with modified independence;with upper extremity assist;from elevated surface PT Transfer Goal: Sit to Stand/Stand to Sit - Progress: Progressing toward goal Pt will Transfer Bed to Chair/Chair to Bed: with modified independence PT Transfer Goal: Bed to Chair/Chair to Bed - Progress: Progressing toward goal Pt will Ambulate: 51 - 150 feet;with modified independence;with least restrictive assistive device;with gait velocity >(comment) ft/second (>1.8 ft/sec to decrease fall risk) PT Goal: Ambulate - Progress: Not met Pt will Go Up / Down Stairs: 1-2 stairs;with modified independence;with least restrictive assistive device;with rail(s) PT Goal: Up/Down Stairs - Progress: Not met  PT Evaluation Precautions/Restrictions  Precautions Precautions: Fall Required Braces or Orthoses: No Restrictions Weight Bearing Restrictions: No Prior Functioning  Home Living Lives With: Alone Receives Help From: Family;Friend(s) Type of Home: House Home Layout: One level Home Access: Stairs to enter Entrance Stairs-Rails: Can reach both Entrance Stairs-Number of Steps: 2 Bathroom Shower/Tub: Tub/shower unit;Walk-in shower Bathroom Toilet: Standard Bathroom Accessibility: Yes Home Adaptive Equipment: None Prior Function Level of Independence: Independent with basic ADLs;Independent with homemaking with ambulation;Independent with gait;Independent with transfers Able to Take Stairs?: Yes Driving: Yes Vocation:  Unemployed Cognition Cognition Arousal/Alertness: Awake/alert Overall Cognitive Status: Impaired Attention: Impaired Current Attention Level: Focused;Other (comment) (progressing to sustained) Attention - Other Comments: Required multiple verbal cues to stand on task and step-by-step instructions to complete tasks. Orientation Level: Oriented to person;Oriented to place;Oriented to situation Safety/Judgement: Decreased safety judgement for tasks assessed Decreased  Safety/Judgement: Impulsive;Decreased awareness of need for assistance Cognition - Other Comments: Slow to process; very restless, but pleasant Sensation/Coordination   Extremity Assessment RUE Assessment RUE Assessment: Within Functional Limits LUE Assessment LUE Assessment: Within Functional Limits RLE Assessment RLE Assessment: Exceptions to [facility] RLE Strength RLE Overall Strength: Deficits;Other (Comment) (~3/5 strength) LLE Assessment LLE Assessment: Exceptions to [facility] LLE Strength LLE Overall Strength: Deficits;Other (Comment) (~3/5 strength) Mobility (including Balance) Bed Mobility Bed Mobility: Yes Rolling Right: 4: Min assist Rolling Right Details (indicate cue type and reason): Verbal instructions for hand placement.  A to complete movement. Right Sidelying to Sit: 4: Min assist;With rails;HOB elevated (comment degrees) Right Sidelying to Sit Details (indicate cue type and reason): A to initiate and complete movement of trunk and shoulders.  Verbal instructions for hand placement and to initiate LE movement. Sitting - Scoot to Edge of Bed: 4: Min assist Sitting - Scoot to [place] of Bed Details (indicate cue type and reason): Verbal cues to initiate.  A to to scoot and maintain balance.   Transfers Transfers: Yes Sit to Stand: 1: +2 Total assist;From bed;With upper extremity assist;From elevated surface;Patient percentage (comment) (pt 60%) Sit to Stand Details (indicate cue type and reason): Verbal cues for hand  placement on bed vs RW.  A (+2) on either side of pt to initiate and complete task.  A to maintain balance. Stand to Sit: 1: +2 Total assist;To chair/3-in-1;With upper extremity assist;With armrests;Patient percentage (comment) (pt 60%) Stand to Sit Details: Verbal cues for hand placement on armrests.  A (+2) for eccentric control during descent. Stand Pivot Transfers: 1: +2 Total assist (pt 60%) Stand Pivot Transfer Details (indicate cue type and reason): Verbal instructions for UE and LE placement.  Verbal cues for pivoting and proper RW use.  A to maintain balance and stability due to pt weakness. Ambulation/Gait Ambulation/Gait: No Stairs: No Wheelchair Mobility Wheelchair Mobility: No  Posture/Postural Control Posture/Postural Control: No significant limitations Balance Balance Assessed: Yes Static Sitting Balance Static Sitting - Balance Support: Bilateral upper extremity supported;Feet supported Static Sitting - Level of Assistance: 5: Stand by assistance Static Sitting - Comment/# of Minutes: 5 min (Pt sat at EOB approximately 5 min) Exercise    End of Session PT - End of Session Equipment Utilized During Treatment: Gait belt;Other (comment) (RW) Activity Tolerance: Patient limited by fatigue;Other (comment) (Limited by cardiopulmonary status (COPD), weakness) Patient left: in chair;with call bell in reach;with family/visitor present Nurse Communication: Mobility status for transfers;Mobility status for ambulation General Behavior During Session: Restless Cognition: [facility] for tasks performed  Lacinda Axon 01/23/2011, 9:25 AM

## 2011-01-23 NOTE — Progress Notes (Signed)
UR Completed.   Sony Schlarb Jane [phone] 01/23/2011  

## 2011-01-23 NOTE — Consult Note (Signed)
Physical Medicine and Rehabilitation Consult Reason for Consult:deconditioned/MI Referring Phsyician: Rachel Chase is an 70 y.o. female.   HPI: 70 year-old female with a former history of tobacco abuse admitted November 22 with cardiac arrest ventricular tachycardia with CPR at the scene. EKG consistent with acute myocardial infarction and underwent PTCA per Dr. 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 Patient remained intubated and November 25 and extubated without difficulty. Currently maintained on aspirin therapy as well as subcutaneous Lovenox for deep vein thrombosis prophylaxis. Physical therapy evaluation 11/27 as patient required total assist to go from sitting to standing position. Also +2 total assistance for stand pivot transfers. Ambulation was not tested. Physical medicine and rehabilitation was consulted at the request of physical therapy due to deconditioning for consideration of inpatient rehabilitation services.  Review of Systems  Constitutional: Positive for malaise/fatigue.  Eyes: Negative for double vision.  Respiratory: Positive for cough and shortness of breath.   Cardiovascular: Negative for chest pain.  Gastrointestinal: Positive for constipation. Negative for nausea.  Genitourinary: Negative for urgency.  Musculoskeletal: Positive for myalgias.  Neurological: Positive for weakness. Negative for dizziness and headaches.  Psychiatric/Behavioral: Negative for depression.   Past Medical History  Diagnosis Date  . Hypertension   . Back pain     Lower Back pain - takes Cortisone shots  . Shortness of breath   . Pneumonia   . Arthritis   . Anxiety    Past Surgical History  Procedure Date  . Abdominal hysterectomy   . Eye surgery     Laser Eye Surgery approx 2007   History reviewed. No pertinent family history. Social History:  reports that she quit smoking about 9 months ago. Her smoking use included Cigarettes. She has a 45 pack-year smoking  history. She does not have any smokeless tobacco history on file. She reports that she does not drink alcohol or use illicit drugs. Allergies: No Known Allergies Medications Prior to Admission  Medication Dose Route Frequency Provider Last Rate Last Dose  . 0.9 %  sodium chloride infusion  250 mL Intravenous PRN Rachel Chase 20 mL/hr at 01/23/11 0700 250 mL at 01/23/11 0700  . acetaminophen (TYLENOL) tablet 650 mg  650 mg Oral Q4H PRN Rachel Pane, MD   650 mg at 01/22/11 1151  . amiodarone (CORDARONE) 150 mg in dextrose 5 % 100 mL bolus  150 mg Intravenous Once Rachel Pane, MD   150 mg at 01/18/11 2200  . amiodarone (CORDARONE) 450 mg in dextrose 5 % 250 mL infusion  60 mg/hr Intravenous Continuous Rachel Pane, MD 33.3 mL/hr at 01/18/11 2300 60 mg/hr at 01/18/11 2300  . amiodarone (CORDARONE,NEXTERONE) 360 mg in dextrose 5% 200 mL 360 MG/200ML infusion        200 mL at 01/18/11 2203  . aspirin chewable tablet 324 mg  324 mg Oral NOW Rachel Chase       Or  . aspirin suppository 300 mg  300 mg Rectal NOW Rachel Chase   300 mg at 01/18/11 2129  . aspirin EC tablet 81 mg  81 mg Oral Daily Rachel Chase, PHARMD   81 mg at 01/23/11 1033  . bivalirudin (ANGIOMAX) 250 MG injection           . bivalirudin (ANGIOMAX) 250 MG injection           . calcium gluconate 1 g in sodium chloride 0.9 % 100 mL IVPB  1 g Intravenous Once Rachel Levans, MD  1 g at 01/18/11 2308  . calcium gluconate 1 g in sodium chloride 0.9 % 100 mL IVPB  1 g Intravenous Once Rachel Levans, MD   1 g at 01/19/11 0355  . cisatracurium (NIMBEX) 1 mg/mL bolus via infusion 3.8 mg  0.05 mg/kg Intravenous Once PRN Rachel Rude. Pickering, MD   3.8 mg at 01/18/11 1655  . cisatracurium (NIMBEX) 1 mg/mL bolus via infusion 3.8 mg  0.05 mg/kg Intravenous Once PRN Rachel Chase      . dextrose 50 % solution        13 mL at 01/20/11 0106  . dextrose 50 % solution        24 mL at 01/20/11 0359  . dextrose 50 % solution        22 mL at  01/20/11 0534  . dextrose 50 % solution        15 mL at 01/20/11 0900  . dextrose 50 % solution        50 mL at 01/20/11 1000  . dextrose 50 % solution        25 mL at 01/20/11 1628  . enoxaparin (LOVENOX) injection 40 mg  40 mg Subcutaneous Q24H Rachel V. Vassie Loll, MD   40 mg at 01/23/11 1033  . fentaNYL (SUBLIMAZE) injection 100 mcg  100 mcg Intravenous Once PRN Rachel Rude. Pickering, MD      . heparin 2-0.9 UNIT/ML-% infusion           . levalbuterol (XOPENEX) nebulizer solution 0.63 mg  0.63 mg Nebulization Q3H PRN Rachel Fischer, MD   0.63 mg at 01/23/11 1123  . levalbuterol (XOPENEX) nebulizer solution 0.63 mg  0.63 mg Nebulization Q8H Rachel Helling, MD   0.63 mg at 01/23/11 0526  . lidocaine (XYLOCAINE) 1 % injection           . LORazepam (ATIVAN) tablet 1 mg  1 mg Oral Once Rachel Farber, MD   1 mg at 01/23/11 0052  . methylPREDNISolone sodium succinate (SOLU-MEDROL) 125 MG injection 80 mg  80 mg Intravenous Once Rachel Fischer, MD   80 mg at 01/21/11 1214  . metoprolol (LOPRESSOR) injection 2.5 mg  2.5 mg Intravenous Q2H PRN Rachel Fischer, MD      . metoprolol tartrate (LOPRESSOR) tablet 12.5 mg  12.5 mg Oral BID Rachel Chase, PHARMD   12.5 mg at 01/23/11 1033  . nitroGLYCERIN (NITROSTAT) SL tablet 0.4 mg  0.4 mg Sublingual Q5 min PRN Rachel Pane, MD      . nitroGLYCERIN (NTG ON-CALL) 0.2 mg/mL injection           . ondansetron (ZOFRAN) injection 4 mg  4 mg Intravenous Q6H PRN Rachel Pane, MD      . pneumococcal 23 valent vaccine (PNU-IMMUNE) injection 0.5 mL  0.5 mL Intramuscular Once Rachel Chase, Rachel Chase      . potassium chloride 10 mEq in 100 mL IVPB  10 mEq Intravenous Q1 Hr x 4 Rachel V. Vassie Loll, MD   10 mEq at 01/19/11 1755  . potassium chloride 10 mEq in 100 mL IVPB  10 mEq Intravenous Q1 Hr x 4 Cidney Hulett   10 mEq at 01/20/11 1228  . potassium chloride 10 mEq in 50 mL *CENTRAL LINE* IVPB  10 mEq Intravenous Q1 Hr x 4 Rachel Levans, MD   10 mEq at 01/19/11 0152    . rosuvastatin (CRESTOR) tablet 20 mg  20 mg Oral QHS Rachel Pane, MD   20 mg  at 01/22/11 2217  . sodium bicarbonate 1 mEq/mL injection           . sodium chloride 0.9 % bolus 1,000 mL  1,000 mL Intravenous Once Harrold Donath R. Pickering, MD   1,000 mL at 01/18/11 1640  . sodium chloride 0.9 % bolus 1,000 mL  1,000 mL Intravenous Once Harrold Donath R. Pickering, MD   1,000 mL at 01/18/11 1630  . sodium chloride 0.9 % bolus 1,000 mL  1,000 mL Intravenous Once Rachel Pane, MD   1,000 mL at 01/18/11 1645  . sodium chloride 0.9 % bolus 750 mL  750 mL Intravenous Once Rachel Levans, MD   750 mL at 01/18/11 2230  . zolpidem (AMBIEN) tablet 5 mg  5 mg Oral QHS PRN Rachel V. Vassie Loll, MD   5 mg at 01/22/11 2217  . DISCONTD: 0.9 %  sodium chloride infusion   Intravenous Continuous Rachel Chase 50 mL/hr at 01/18/11 2000    . DISCONTD: amiodarone (CORDARONE) 1.8 mg/mL load via infusion 150 mg  150 mg Intravenous Once Rachel Pane, MD      . DISCONTD: amiodarone (CORDARONE) 450 mg in dextrose 5 % 250 mL infusion  30 mg/hr Intravenous Continuous Rachel Pane, MD   15 mg/hr at 01/19/11 2000  . DISCONTD: antiseptic oral rinse (BIOTENE) solution 15 mL  15 mL Mouth Rinse QID Rachel Levans, MD   15 mL at 01/22/11 1218  . DISCONTD: artificial tears (LACRILUBE) ophthalmic ointment 1 application  1 application Both Eyes Q8H Rachel R. Rubin Payor, MD   1 application at 01/19/11 2208  . DISCONTD: artificial tears (LACRILUBE) ophthalmic ointment 1 application  1 application Both Eyes Q8H Rachel Chase      . DISCONTD: aspirin 81 MG chewable tablet           . DISCONTD: aspirin EC tablet 81 mg  81 mg Oral Daily Rachel Pane, MD      . DISCONTD: aspirin suppository 300 mg  300 mg Rectal NOW Rachel Rude. Pickering, MD      . DISCONTD: aspirin suppository 300 mg  300 mg Rectal NOW Rachel Chase      . DISCONTD: aspirin suppository 300 mg  300 mg Rectal Daily Rachel Pane, MD   300 mg at 01/21/11 1048  . DISCONTD: atropine 1  MG/ML injection           . DISCONTD: carvedilol (COREG) tablet 3.125 mg  3.125 mg Oral BID WC Rachel Pane, MD      . DISCONTD: chlorhexidine (PERIDEX) 0.12 % solution 15 mL  15 mL Mouth Rinse BID Rachel Levans, MD   15 mL at 01/21/11 2101  . DISCONTD: cisatracurium (NIMBEX) 2 MG/ML injection 0.1 mg/kg  0.1 mg/kg Intravenous Once Harrold Donath R. Pickering, MD      . DISCONTD: cisatracurium (NIMBEX) 2 MG/ML injection 0.1 mg/kg  0.1 mg/kg Intravenous Once PRN Rachel Rude. Pickering, MD      . DISCONTD: cisatracurium (NIMBEX) 2 MG/ML injection 7.6 mg  0.1 mg/kg Intravenous Once [employer]      . DISCONTD: cisatracurium (NIMBEX) 2 MG/ML injection 7.6 mg  0.1 mg/kg Intravenous Once PRN Rachel Chase      . DISCONTD: cisatracurium (NIMBEX) 200 mg in sodium chloride 0.9 % 180 mL infusion  0.5-10 mcg/kg/min Intravenous Continuous Rachel Chase, PHARMD 7.4 mL/hr at 01/19/11 2000 1.5 mcg/kg/min at 01/19/11 2000  . DISCONTD: cisatracurium (NIMBEX) 200 mg in sodium chloride 0.9 % 200 mL infusion  1-1.5 mcg/kg/min  Intravenous Continuous Rachel Rude. Pickering, MD 4.5 mL/hr at 01/18/11 2300 1 mcg/kg/min at 01/18/11 2300  . DISCONTD: cisatracurium (NIMBEX) 200 mg in sodium chloride 0.9 % 200 mL infusion  1-1.5 mcg/kg/min Intravenous Continuous Rachel Chase      . DISCONTD: cisatracurium (NIMBEX) 200 mg in sodium chloride 0.9 % 200 mL infusion  1-1.5 mcg/kg/min Intravenous Continuous Drusilla Kanner, PHARMD   1.5 mcg/kg/min at 01/19/11 2257  . DISCONTD: dextrose 10 % infusion   Intravenous Continuous Rachel Chase      . DISCONTD: dextrose 10 % infusion   Intravenous Continuous Rachel Chase      . DISCONTD: DOPamine (INTROPIN) 800 mg in dextrose 5 % 250 mL infusion  2-20 mcg/kg/min Intravenous Titrated Rachel Pane, MD   5 mcg/kg/min at 01/20/11 0411  . DISCONTD: fentaNYL (SUBLIMAZE) 10 mcg/mL in sodium chloride 0.9 % 250 mL infusion  100 mcg/hr Intravenous Continuous Rachel Fischer, MD   50 mcg/hr at 01/21/11  [phone]  . DISCONTD: fentaNYL (SUBLIMAZE) 10 mcg/mL in sodium chloride 0.9 % 250 mL infusion  25-300 mcg/hr Intravenous Continuous Rachel Chase      . DISCONTD: fentaNYL (SUBLIMAZE) 10 mcg/mL in sodium chloride 0.9 % 250 mL infusion  50-300 mcg/hr Intravenous Titrated Cidney Hulett 10 mL/hr at 01/20/11 0940 100 mcg/hr at 01/20/11 0940  . DISCONTD: fentaNYL (SUBLIMAZE) bolus via infusion 50 mcg  50 mcg Intravenous Q15 min PRN Rachel Rude. Rubin Payor, MD   50 mcg at 01/20/11 [phone]  . DISCONTD: fentaNYL (SUBLIMAZE) bolus via infusion 50 mcg  50 mcg Intravenous Q15 min PRN Rachel Chase      . DISCONTD: fentaNYL (SUBLIMAZE) bolus via infusion 50 mcg  50 mcg Intravenous Q2H PRN Rachel Fischer, MD      . DISCONTD: fentaNYL (SUBLIMAZE) bolus via infusion 50-100 mcg  50-100 mcg Intravenous Q6H PRN Cidney Hulett      . DISCONTD: fentaNYL (SUBLIMAZE) injection 100 mcg  100 mcg Intravenous Once [employer]. Pickering, MD      . DISCONTD: fentaNYL (SUBLIMAZE) injection 100 mcg  100 mcg Intravenous Once [employer]      . DISCONTD: fentaNYL (SUBLIMAZE) injection 100 mcg  100 mcg Intravenous Once PRN Rachel Chase      . DISCONTD: fentaNYL (SUBLIMAZE) injection 50 mcg  50 mcg Intravenous Q2H PRN Severiano Gilbert, PHARMD      . DISCONTD: insulin aspart (novoLOG) injection 0-4 Units  0-4 Units Subcutaneous Q4H Rachel Chase      . DISCONTD: insulin aspart (novoLOG) injection 0-4 Units  0-4 Units Subcutaneous Q4H Rachel Chase      . DISCONTD: insulin aspart (novoLOG) injection 0-9 Units  0-9 Units Subcutaneous Q4H Rachel Fischer, MD   1 Units at 01/21/11 [phone]  . DISCONTD: insulin regular (HUMULIN R,NOVOLIN R) 1 Units/mL in sodium chloride 0.9 % 100 mL infusion   Intravenous Continuous Rachel Chase   0.9 Units/hr at 01/20/11 0136  . DISCONTD: levalbuterol (XOPENEX) nebulizer solution 0.63 mg  0.63 mg Nebulization Q6H Rachel Fischer, MD   0.63 mg at 01/21/11 1440  . DISCONTD: levalbuterol (XOPENEX) nebulizer solution 0.63 mg  0.63 mg  Nebulization QID Provider Default   0.63 mg at 01/22/11 0813  . DISCONTD: metoprolol (LOPRESSOR) 1 MG/ML injection           . DISCONTD: metoprolol (LOPRESSOR) injection 2.5 mg  2.5 mg Intravenous Q2H Rachel Levans, MD   2.5 mg at 01/18/11 2158  . DISCONTD: metoprolol (LOPRESSOR) injection 2.5 mg  2.5 mg Intravenous Q6H  Rachel Fischer, MD   2.5 mg at 01/22/11 1202  . DISCONTD: metoprolol tartrate (LOPRESSOR) tablet 12.5 mg  12.5 mg Oral BID Rachel Chase, PHARMD      . DISCONTD: midazolam (VERSED) 1 mg/mL bolus via infusion 1-2 mg  1-2 mg Intravenous Q2H PRN Cidney Hulett      . DISCONTD: midazolam (VERSED) 1 mg/mL bolus via infusion 2 mg  2 mg Intravenous Q30 min PRN Rachel Rude. Pickering, MD   2 mg at 01/20/11 0605  . DISCONTD: midazolam (VERSED) 1 mg/mL bolus via infusion 2 mg  2 mg Intravenous Q30 min PRN Rachel Chase      . DISCONTD: midazolam (VERSED) 1 mg/mL in sodium chloride 0.9 % 50 mL infusion  1-10 mg/hr Intravenous Continuous Rachel Rude. Pickering, MD 4 mL/hr at 01/20/11 0243 4 mg/hr at 01/20/11 0243  . DISCONTD: midazolam (VERSED) 1 mg/mL in sodium chloride 0.9 % 50 mL infusion  1-10 mg/hr Intravenous Continuous Rachel Chase      . DISCONTD: midazolam (VERSED) 1 mg/mL in sodium chloride 0.9 % 50 mL infusion  2-10 mg/hr Intravenous Titrated Cidney Hulett   2 mg/hr at 01/20/11 0900  . DISCONTD: midazolam (VERSED) injection 2 mg  2 mg Intravenous Once Rachel R. Pickering, MD      . DISCONTD: midazolam (VERSED) injection 2 mg  2 mg Intravenous Once PRN Rachel Rude. Pickering, MD      . DISCONTD: midazolam (VERSED) injection 2 mg  2 mg Intravenous Once Rachel Chase      . DISCONTD: midazolam (VERSED) injection 2 mg  2 mg Intravenous Once PRN Rachel Chase      . DISCONTD: norepinephrine (LEVOPHED) 16 mg in dextrose 5 % 250 mL infusion  2-50 mcg/min Intravenous Titrated Rachel Fischer, MD   10 mcg/min at 01/21/11 0800  . DISCONTD: norepinephrine (LEVOPHED) 4 mg in dextrose 5 % 250 mL infusion  0.5-10  mcg/min Intravenous Titrated Rachel Rude. Pickering, MD 112.5 mL/hr at 01/18/11 2030 30 mcg/min at 01/18/11 2030  . DISCONTD: norepinephrine (LEVOPHED) 4 mg in dextrose 5 % 250 mL infusion  0.5-10 mcg/min Intravenous Titrated Rachel Chase      . DISCONTD: pantoprazole (PROTONIX) injection 40 mg  40 mg Intravenous Daily Rachel Chase   40 mg at 01/20/11 1100  . DISCONTD: phenylephrine (NEO-SYNEPHRINE) 10,000 mcg in dextrose 5 % 250 mL infusion  30-200 mcg/min Intravenous Continuous Rachel Levans, MD 225 mL/hr at 01/19/11 0414 150 mcg/min at 01/19/11 0414  . DISCONTD: phenylephrine (NEO-SYNEPHRINE) 40,000 mcg in dextrose 5 % 250 mL infusion  30-200 mcg/min Intravenous Continuous Janice Coffin, RPH   75 mcg/min at 01/19/11 1610  . DISCONTD: pneumococcal 23 valent vaccine (PNU-IMMUNE) injection 0.5 mL  0.5 mL Intramuscular Tomorrow-1000 Rachel Levans, MD      . DISCONTD: potassium chloride 10 mEq in 100 mL IVPB  10 mEq Intravenous Q1 Hr x 6 Rachel Chase      . DISCONTD: potassium chloride 10 mEq in 100 mL IVPB  10 mEq Intravenous Q1 Hr x 4 Cidney Hulett      . DISCONTD: potassium chloride 10 mEq in 50 mL *CENTRAL LINE* IVPB  10 mEq Intravenous Q1 Hr x 4 Rachel Pane, MD      . DISCONTD: potassium chloride 10 mEq in 50 mL *CENTRAL LINE* IVPB  10 mEq Intravenous Q1 Hr x 4 Rachel Pane, MD      . DISCONTD: rosuvastatin (CRESTOR) tablet 20 mg  20 mg Per NG tube Daily  Rachel Pane, MD      . DISCONTD: sodium bicarbonate 150 mEq in dextrose 5 % 1,000 mL infusion   Intravenous Continuous Rachel Fischer, MD      . DISCONTD: vasopressin (PITRESSIN) 50 Units in sodium chloride 0.9 % 250 mL infusion  0.03 Units/min Intravenous Continuous Rachel Levans, MD   0.03 Units/min at 01/18/11 2300   Medications Prior to Admission  Medication Sig Dispense Refill  . gabapentin (NEURONTIN) 300 MG capsule Take 300 mg by mouth 3 (three) times daily.        Marland Kitchen HYDROcodone-acetaminophen (VICODIN) 5-500 MG per tablet  Take 1 tablet by mouth every 6 (six) hours as needed. For pain.       Marland Kitchen ibuprofen (ADVIL,MOTRIN) 600 MG tablet Take 600 mg by mouth every 8 (eight) hours as needed. For pain.       Marland Kitchen olmesartan-hydrochlorothiazide (BENICAR HCT) 40-25 MG per tablet Take 1 tablet by mouth daily.        . simvastatin (ZOCOR) 20 MG tablet Take 20 mg by mouth at bedtime.          Home: Home Living Lives With: Alone Receives Help From: Family;Friend(s) Type of Home: House Home Layout: One level Home Access: Stairs to enter Entrance Stairs-Rails: Can reach both Entrance Stairs-Number of Steps: 2 Bathroom Shower/Tub: Tub/shower unit;Walk-in shower Bathroom Toilet: Standard Bathroom Accessibility: Yes Home Adaptive Equipment: None  Functional History: Prior Function Level of Independence: Independent with basic ADLs;Independent with homemaking with ambulation;Independent with gait;Independent with transfers Able to Take Stairs?: Yes Driving: Yes Vocation: Unemployed Functional Status:  Mobility: Bed Mobility Bed Mobility: Yes Rolling Right: 4: Min assist Rolling Right Details (indicate cue type and reason): Verbal instructions for hand placement.  A to complete movement. Right Sidelying to Sit: 4: Min assist;With rails;HOB elevated (comment degrees) Right Sidelying to Sit Details (indicate cue type and reason): A to initiate and complete movement of trunk and shoulders.  Verbal instructions for hand placement and to initiate LE movement. Sitting - Scoot to Edge of Bed: 4: Min assist Sitting - Scoot to [place] of Bed Details (indicate cue type and reason): Verbal cues to initiate.  A to to scoot and maintain balance.   Transfers Transfers: Yes Sit to Stand: 1: +2 Total assist;From bed;With upper extremity assist;From elevated surface;Patient percentage (comment) (pt 60%) Sit to Stand Details (indicate cue type and reason): Verbal cues for hand placement on bed vs RW.  A (+2) on either side of pt to initiate  and complete task.  A to maintain balance. Stand to Sit: 1: +2 Total assist;To chair/3-in-1;With upper extremity assist;With armrests;Patient percentage (comment) (pt 60%) Stand to Sit Details: Verbal cues for hand placement on armrests.  A (+2) for eccentric control during descent. Stand Pivot Transfers: 1: +2 Total assist (pt 60%) Stand Pivot Transfer Details (indicate cue type and reason): Verbal instructions for UE and LE placement.  Verbal cues for pivoting and proper RW use.  A to maintain balance and stability due to pt weakness. Ambulation/Gait Ambulation/Gait: No Stairs: No Wheelchair Mobility Wheelchair Mobility: No  ADL:    Cognition: Cognition Arousal/Alertness: Awake/alert Orientation Level: Oriented to person;Oriented to place;Oriented to situation Cognition Arousal/Alertness: Awake/alert Overall Cognitive Status: Impaired Attention: Impaired Current Attention Level: Focused;Other (comment) (progressing to sustained) Attention - Other Comments: Required multiple verbal cues to stand on task and step-by-step instructions to complete tasks. Orientation Level: Oriented to person;Oriented to place;Oriented to situation Safety/Judgement: Decreased safety judgement for tasks assessed Decreased Safety/Judgement: Impulsive;Decreased awareness  of need for assistance Cognition - Other Comments: Slow to process; very restless, but pleasant  Blood pressure 146/71, pulse 98, temperature 97.5 F (36.4 C), temperature source Axillary, resp. rate 18, height 5\' 5"  (1.651 m), weight 99.1 kg (218 lb 7.6 oz), SpO2 100.00%. Physical Exam  Vitals reviewed. Constitutional: No distress.  HENT:  Head: Normocephalic.  Neck: Normal range of motion. Neck supple. No thyromegaly present.  Cardiovascular: Regular rhythm.        Patient with trace edema in bilateral lower [place].  Pulmonary/Chest: No respiratory distress. She has wheezes.  Abdominal: Soft. She exhibits no distension. There is  no tenderness.  Musculoskeletal: She exhibits no edema.  Neurological: She is alert. She has normal reflexes.       Needs some cues for situation and recall. The patient's thought processing is somewhat tangential and circumferential at times. She was warranted to biographical information however. She gave me details about her home situation etc.  Patient with 4-5 out of 5 strength in the upper ext use. Lower ext strength is grossly 2+  To 3 out of 5 proximally to 4/5 distally   Skin: Skin is warm and dry.  Psychiatric:       Needs some cues for safety    Results for orders placed during the hospital encounter of 01/18/11 (from the past 24 hour(s))  GLUCOSE, CAPILLARY     Status: Abnormal   Collection Time   01/22/11  5:23 PM      Component Value Range   Glucose-Capillary 104 (*) 70 - 99 (mg/dL)  BASIC METABOLIC PANEL     Status: Abnormal   Collection Time   01/23/11  4:10 AM      Component Value Range   Sodium 141  135 - 145 (mEq/L)   Potassium 3.7  3.5 - 5.1 (mEq/L)   Chloride 103  96 - 112 (mEq/L)   CO2 29  19 - 32 (mEq/L)   Glucose, Bld 100 (*) 70 - 99 (mg/dL)   BUN 25 (*) 6 - 23 (mg/dL)   Creatinine, Ser 0.45  0.50 - 1.10 (mg/dL)   Calcium 8.0 (*) 8.4 - 10.5 (mg/dL)   GFR calc non Af Amer 69 (*) >90 (mL/min)   GFR calc Af Amer 80 (*) >90 (mL/min)  CBC     Status: Abnormal   Collection Time   01/23/11  4:10 AM      Component Value Range   WBC 9.6  4.0 - 10.5 (K/uL)   RBC 3.26 (*) 3.87 - 5.11 (MIL/uL)   Hemoglobin 9.3 (*) 12.0 - 15.0 (g/dL)   HCT 40.9 (*) 81.1 - 46.0 (%)   MCV 85.9  78.0 - 100.0 (fL)   MCH 28.5  26.0 - 34.0 (pg)   MCHC 33.2  30.0 - 36.0 (g/dL)   RDW 91.4  78.2 - 95.6 (%)   Platelets 171  150 - 400 (K/uL)   Dg Chest Port 1 View  01/23/2011  *RADIOLOGY REPORT*  Clinical Data: Shortness of breath, wheezing.  PORTABLE CHEST - 1 VIEW  Comparison: 01/21/2011  Findings: Interval extubation and removal of NG tube.  Heart is borderline in size.  Bibasilar  opacities, likely atelectasis.  Mild vascular congestion and possible interstitial edema.  Suspect small bilateral effusions.  IMPRESSION: Vascular congestion and probable mild interstitial edema.  Bibasilar atelectasis, small effusions.  Original Report Authenticated By: Cyndie Chime, M.D.    Assessment/Plan: Diagnosis: Deconditioning after MI and respiratory failure. Patient with mild encephalopathy also 1.  Does the need for close, 24 hr/day medical supervision in concert with the patient's rehab needs make it unreasonable for this patient to be served in a less intensive setting? Yes 2. Co-Morbidities requiring supervision/potential complications: Ongoing respiratory compromise 3. Due to bladder management, bowel management, skin/wound care, disease management, medication administration and patient education, does the patient require 24 hr/day rehab nursing? Yes 4. Does the patient require coordinated care of a physician, rehab nurse, PT (1-2 hrs/day, 5 days/week) and OT (1-2 hrs/day, 5 days/week) (?SLP) to address physical and functional deficits in the context of the above medical diagnosis(es)? Yes Addressing deficits in the following areas: balance, bowel/bladder control, cognition, endurance, grooming, locomotion, psychosocial adjustment, strength, swallowing, toileting and transferring 5. Can the patient actively participate in an intensive therapy program of at least 3 hrs of therapy per day at least 5 days per week? Yes 6. The potential for patient to make measurable gains while on inpatient rehab is excellent 7. Anticipated functional outcomes upon discharge from inpatients are supervision PT, supervision OT, modified independent SLP 8. Estimated rehab length of stay to reach the above functional goals is: 7-12 days 9. Does the patient have adequate social supports to accommodate these discharge functional goals? Yes and Potentially 10. Anticipated D/C setting: Home 11. Anticipated post  D/C treatments: HH therapy 12. Overall Rehab/Functional Prognosis: good  RECOMMENDATIONS: This patient's condition is appropriate for continued rehabilitative care in the following setting: CIR Patient has agreed to participate in recommended program. Yes Note that insurance prior authorization may be required for reimbursement for recommended care.  Comment: Need to confirm social supports. She will likely need supervision at discharge.    01/23/2011

## 2011-01-23 NOTE — Progress Notes (Addendum)
Rachel Chase is a 70 y.o. female former smoker admitted on 01/18/2011 with cardiac arrest 2nd to VT with Rt coronary MI s/p hypothermia protocol. PMHx HTN, COPD, Arthritis, Anxiety, Chronic back pain  Line/tube: ETT 11/22>>11/24 Rt [place] CVL 11/22>>  Tests/events: 11/22 Cardiac cath>>100% RCA occlusion with type 2 dissection after attempted PCI 11/22 CT head/neck>>minimal small vessel chronic ischemic changes. DJD of C-spine 11/22 Echo>>EF 50 to 55%  SUBJECTIVE: Feels weak.  Has occasional cough with chest congestion and wheeze.  Denies chest/abd pain.  OBJECTIVE:  Blood pressure 147/62, pulse 95, temperature 97.6 F (36.4 C), temperature source Oral, resp. rate 18, height 5\' 5"  (1.651 m), weight 99.1 kg (218 lb 7.6 oz), SpO2 100.00%.   Intake/Output Summary (Last 24 hours) at 01/23/11 1454 Last data filed at 01/23/11 1400  Gross per 24 hour  Intake   1340 ml  Output   2461 ml  Net  -1121 ml    General - obese, no distress HEENT - no oral exudate Cardiac - s1s2 regular, no murmur Chest - diminished breath sounds, no wheeze/rales Abd - soft, nontender, normal bowel sounds Ext - no edema GU - foley in place Neuro - A&O x 3  Lab Results  Component Value Date   WBC 9.6 01/23/2011   HGB 9.3* 01/23/2011   HCT 28.0* 01/23/2011   MCV 85.9 01/23/2011   PLT 171 01/23/2011   Lab Results  Component Value Date   CREATININE 0.84 01/23/2011   BUN 25* 01/23/2011   NA 141 01/23/2011   K 3.7 01/23/2011   CL 103 01/23/2011   CO2 29 01/23/2011     Dg Chest Port 1 View  01/23/2011  *RADIOLOGY REPORT*  Clinical Data: Shortness of breath, wheezing.  PORTABLE CHEST - 1 VIEW  Comparison: 01/21/2011  Findings: Interval extubation and removal of NG tube.  Heart is borderline in size.  Bibasilar opacities, likely atelectasis.  Mild vascular congestion and possible interstitial edema.  Suspect small bilateral effusions.  IMPRESSION: Vascular congestion and probable mild interstitial  edema.  Bibasilar atelectasis, small effusions.  Original Report Authenticated By: Cyndie Chime, M.D.    ASSESSMENT/PLAN:  Cardiac arrest in setting of acute MI with VF/VT arrest with cardiogenic shock with Hx of HTN - Shock resolved. - Keep CVL in for now, difficult to obtain PIV. - Meds per cardiology. - Completed hypothermia protocol 11/24.  VDRF in setting of cardiac arrest - Resolved. - Required one dose of solumedrol after extubation for stridor which have now resolved.Marland Kitchen  Hx of smoking with ?COPD - Continue bronchodilators - Will need further assessment as outpt  Atelectasis: essentially resolved at this point. - IS per RT protocol. - Bronchial hygiene.  Hyperglycemia: - Consult diabetes coordinator.  Anemia - Follow up CBC  Debility - OOB to chair, PT evaluation - Keep foley in until assessed by PT  Disposition - Keep as SDU status. - Transfer care to Dr. Sharyn Lull.  PCCM signing off, please call back if needed.  Koren Bound, M.D.

## 2011-01-23 NOTE — Progress Notes (Signed)
Subjective:  Patient denies any chest pain but complains of mild shortness of breath and leg swelling Patient is more awake and sitting in chair No further V. fib or A. fib on the monitor  Objective:  Vital Signs in the last 24 hours: Temp:  [97.2 F (36.2 C)-98.3 F (36.8 C)] 97.5 F (36.4 C) (11/27 0858) Pulse Rate:  [77-103] 98  (11/27 0858) Resp:  [12-28] 18  (11/27 0858) BP: (116-148)/(54-84) 146/71 mmHg (11/27 0858) SpO2:  [94 %-100 %] 94 % (11/27 0858) Weight:  [99.1 kg (218 lb 7.6 oz)] 218 lb 7.6 oz (99.1 kg) (11/27 0400)  Intake/Output from previous day: 11/26 0701 - 11/27 0700 In: 1080 [P.O.:600; I.V.:480] Out: 2890 [Urine:2890] Intake/Output from this shift: Total I/O In: 160 [P.O.:120; I.V.:40] Out: -   Physical Exam: General appearance: alert and cooperative Neck: no adenopathy, no carotid bruit, no JVD and supple, symmetrical, trachea midline Lungs: clear to auscultation bilaterally Heart: regular rate and rhythm, S1, S2 normal, no murmur, click, rub or gallop Abdomen: soft, non-tender; bowel sounds normal; no masses,  no organomegaly Extremities: edema 1+ edema  Lab Results:  Basename 01/23/11 0410 01/21/11 0345  WBC 9.6 11.4*  HGB 9.3* 10.4*  PLT 171 177    Basename 01/23/11 0410 01/21/11 0345  NA 141 136  K 3.7 3.7  CL 103 99  CO2 29 29  GLUCOSE 100* 131*  BUN 25* 23  CREATININE 0.84 1.02    Basename 01/21/11 0001  TROPONINI 10.11*   Hepatic Function Panel No results found for this basename: PROT,ALBUMIN,AST,ALT,ALKPHOS,BILITOT,BILIDIR,IBILI in the last 72 hours No results found for this basename: CHOL in the last 72 hours No results found for this basename: PROTIME in the last 72 hours  Imaging:  Cardiac Studies:  Assessment/Plan:  Status post out of hospital V. fib cardiac arrest Status post inferior wall/RV infarct status post attempted PCI to RCA Status post hypotensive shock Status post went dependent respiratory failure Mild  volume overload  Hypertension Diabetes matters Hypercholesteremia Plan Continue present management Increase via blockers as tolerated Consider inpatient rehabilitation Check labs in a.m.  LOS: 5 days    Devine Klingel N 01/23/2011, 10:15 AM

## 2011-01-23 NOTE — Progress Notes (Signed)
Occupational Therapy Evaluation Patient Details Name: Rachel Chase MRN: [id number] DOB: 1940-03-31 Today's Date: 01/23/2011  Problem List: There is no problem list on file for this patient.   Past Medical History:  Past Medical History  Diagnosis Date  . Hypertension   . Back pain     Lower Back pain - takes Cortisone shots  . Shortness of breath   . Pneumonia   . Arthritis   . Anxiety    Past Surgical History:  Past Surgical History  Procedure Date  . Abdominal hysterectomy   . Eye surgery     Laser Eye Surgery approx 2007    OT Assessment/Plan/Recommendation OT Assessment Clinical Impression Statement: This 70 y.o. female presents to OT s/p cardiac arrest and VDRF.  Pt. demonstrates generalized weakness, decreased endurance, significant cognitive impairment, decreased balance, which impair pts. ability to perform BADLs independently.  Pt. will need 24 hour supervision at D/C.  Recommend CIR and continued OT to allow pt. to achieve supervision level with BADLs OT Recommendation/Assessment: Patient will need skilled OT in the acute care venue OT Problem List: Decreased strength;Decreased activity tolerance;Impaired balance (sitting and/or standing);Decreased cognition;Decreased safety awareness;Decreased knowledge of use of DME or AE;Cardiopulmonary status limiting activity Barriers to Discharge: Decreased caregiver support (Uncertain if pt. will have 24 hour supervision at D/C) OT Therapy Diagnosis : Generalized weakness;Cognitive deficits OT Plan OT Frequency: Min 2X/week OT Treatment/Interventions: Self-care/ADL training;DME and/or AE instruction;Therapeutic activities;Cognitive remediation/compensation;Patient/family education;Balance training OT Recommendation Recommendations for Other Services: Speech consult Follow Up Recommendations: Inpatient Rehab Equipment Recommended: Defer to next venue Individuals Consulted Consulted and Agree with Results and  Recommendations: Patient OT Goals Acute Rehab OT Goals OT Goal Formulation: With patient Time For Goal Achievement: 2 weeks ADL Goals Pt Will Perform Eating: Independently Pt Will Perform Grooming: with min assist;Standing at sink Pt Will Perform Upper Body Bathing: with supervision;with cueing (comment type and amount) (min verbal cues) Pt Will Perform Lower Body Bathing: with min assist;Sit to stand from chair;Sit to stand from bed;with cueing (comment type and amount) (min verbal cues) Pt Will Transfer to Toilet: with min assist;Ambulation;Comfort height toilet Pt Will Perform Toileting - Clothing Manipulation: with min assist;Standing Pt Will Perform Toileting - Hygiene: with supervision Additional ADL Goal #1: Pt. will maintain selective attention with minimal verbal cues during familiar self care activities  OT Evaluation Precautions/Restrictions  Precautions Precautions: Fall Required Braces or Orthoses: No Restrictions Weight Bearing Restrictions: No Prior Functioning Home Living Home Adaptive Equipment: Grab bars in shower Prior Function Comments: Pt. reports recent unexplained death of son ADL ADL Eating/Feeding: Simulated;Set up Where Assessed - Eating/Feeding: Bed level Grooming: Simulated;Wash/dry face;Brushing hair;Minimal assistance Where Assessed - Grooming: Unsupported;Sitting, chair Upper Body Bathing: Simulated;Moderate assistance (requires cues for thoroughness) Where Assessed - Upper Body Bathing: Supported;Sitting, bed Lower Body Bathing: Simulated;Maximal assistance (mod cues for thoroughness) Where Assessed - Lower Body Bathing: Sit to stand from bed Upper Body Dressing: Simulated;Moderate assistance (due to fatigue) Where Assessed - Upper Body Dressing: Supported;Sitting, bed Lower Body Dressing: Simulated;+1 Total assistance Where Assessed - Lower Body Dressing: Sit to stand from bed Toilet Transfer: Simulated;Moderate assistance Toilet Transfer  Method: Stand pivot Toilet Transfer Equipment: Bedside commode Toileting - Clothing Manipulation: Simulated;+1 Total assistance Where Assessed - Glass blower/designer Manipulation: Standing Toileting - Hygiene: Simulated;Maximal assistance Where Assessed - Toileting Hygiene: Standing Tub/Shower Transfer: Not assessed Equipment Used:  (none) ADL Comments: Pt. limited by fatigue and demonstrates impaired attention, decreased problem solving, decreased thoroughness, impaired memory. Vision/Perception  Vision -  Assessment Vision Assessment: Vision not tested Perception Perception: Not tested Cognition Cognition Arousal/Alertness: Awake/alert Overall Cognitive Status: Impaired Attention: Impaired Current Attention Level: Sustained Memory: Appears impaired Memory Deficits: Pt. unable to recall events of day.  Nsg reports pt. asking questions repetivitely, no recollection of events. Orientation Level: Oriented to person;Oriented to place;Oriented to situation Safety/Judgement: Decreased safety judgement for tasks assessed Safety/Judgement - Other Comments: pt. is restless, impulsive, and demonstrates disinhibition Awareness of Errors: Decreased awareness of errors made Awareness of Deficits: Decreased awareness of deficits Problem Solving: Requires assistance for problem solving Sensation/Coordination Sensation Light Touch: Not tested Coordination Gross Motor Movements are Fluid and Coordinated: Yes Fine Motor Movements are Fluid and Coordinated: Yes Extremity Assessment RUE Assessment RUE Assessment: Within Functional Limits LUE Assessment LUE Assessment: Within Functional Limits Mobility  Bed Mobility Bed Mobility: Yes Rolling Right: 4: Min assist Rolling Right Details (indicate cue type and reason): cues to complete movement Right Sidelying to Sit: 4: Min assist (but as she fatigues decreases to mod A) Sitting - Scoot to Edge of Bed: 4: Min assist (Pt requires supervision to min  A for EOB sitting.  Fatigues) Sitting - Scoot to Edge of Bed Details (indicate cue type and reason): Pt. fatigues on EOB, and impulsively falls back into bed Transfers Transfers: No Exercises   End of Session OT - End of Session Equipment Utilized During Treatment:  (none) Activity Tolerance: Patient limited by fatigue Patient left: in bed;with call bell in reach (sitter present) Nurse Communication:  (status of eval) General Behavior During Session: Restless Cognition: Impaired   Nahiara Kretzschmar M 01/23/2011, 5:38 PM

## 2011-01-24 LAB — CBC
HCT: 29.5 % — ABNORMAL LOW (ref 36.0–46.0)
Hemoglobin: 9.5 g/dL — ABNORMAL LOW (ref 12.0–15.0)
MCV: 87 fL (ref 78.0–100.0)
RBC: 3.39 MIL/uL — ABNORMAL LOW (ref 3.87–5.11)
RDW: 13.2 % (ref 11.5–15.5)
WBC: 7.6 10*3/uL (ref 4.0–10.5)

## 2011-01-24 LAB — BASIC METABOLIC PANEL
BUN: 21 mg/dL (ref 6–23)
CO2: 29 mEq/L (ref 19–32)
Chloride: 104 mEq/L (ref 96–112)
Creatinine, Ser: 0.74 mg/dL (ref 0.50–1.10)
Glucose, Bld: 80 mg/dL (ref 70–99)

## 2011-01-24 LAB — CARDIAC PANEL(CRET KIN+CKTOT+MB+TROPI): Total CK: 244 U/L — ABNORMAL HIGH (ref 7–177)

## 2011-01-24 MED ORDER — POTASSIUM CHLORIDE CRYS ER 20 MEQ PO TBCR
20.0000 meq | EXTENDED_RELEASE_TABLET | Freq: Every day | ORAL | Status: DC
  Administered 2011-01-24: 20 meq via ORAL
  Filled 2011-01-24 (×2): qty 1

## 2011-01-24 MED ORDER — FUROSEMIDE 10 MG/ML IJ SOLN
40.0000 mg | Freq: Every day | INTRAMUSCULAR | Status: DC
  Administered 2011-01-24 – 2011-01-27 (×5): 40 mg via INTRAVENOUS
  Filled 2011-01-24 (×4): qty 4

## 2011-01-24 MED ORDER — GUAIFENESIN-DM 100-10 MG/5ML PO SYRP
15.0000 mL | ORAL_SOLUTION | ORAL | Status: DC | PRN
  Administered 2011-01-29: 15 mL via ORAL
  Filled 2011-01-24: qty 15

## 2011-01-24 MED ORDER — PHENOL 1.4 % MT LIQD
1.0000 | OROMUCOSAL | Status: DC | PRN
  Administered 2011-01-24: 1 via OROMUCOSAL
  Filled 2011-01-24: qty 177

## 2011-01-24 NOTE — Progress Notes (Signed)
Subjective:  Patient complains of shortness of breath with minimal exertion also complains of swelling in arms and legs Patient denies any chest pain nausea or vomiting  Objective:  Vital Signs in the last 24 hours: Temp:  [97.6 F (36.4 C)-99.2 F (37.3 C)] 98 F (36.7 C) (11/28 0816) Pulse Rate:  [74-100] 93  (11/28 0816) Resp:  [18-22] 19  (11/28 0537) BP: (111-159)/(62-91) 158/83 mmHg (11/28 0816) SpO2:  [94 %-100 %] 100 % (11/28 0816) Weight:  [97.8 kg (215 lb 9.8 oz)] 215 lb 9.8 oz (97.8 kg) (11/28 0500)  Intake/Output from previous day: 11/27 0701 - 11/28 0700 In: 960 [P.O.:560; I.V.:400] Out: 1552 [Urine:1550; Stool:2] Intake/Output from this shift: Total I/O In: 20 [I.V.:20] Out: 2 [Urine:1; Stool:1]  Physical Exam: General appearance: alert and cooperative Neck: no adenopathy, no carotid bruit, no JVD and supple, symmetrical, trachea midline Lungs: diminished breath sounds bibasilar Heart: regular rate and rhythm, S1, S2 normal, no murmur, click, rub or gallop Abdomen: soft, non-tender; bowel sounds normal; no masses,  no organomegaly Extremities: 2+ edema Pulses: 2+ and symmetric  Lab Results:  Basename 01/24/11 0409 01/23/11 0410  WBC 7.6 9.6  HGB 9.5* 9.3*  PLT 164 171    Basename 01/24/11 0409 01/23/11 0410  NA 141 141  K 3.4* 3.7  CL 104 103  CO2 29 29  GLUCOSE 80 100*  BUN 21 25*  CREATININE 0.74 0.84    Basename 01/24/11 0409  TROPONINI 2.92*   Hepatic Function Panel No results found for this basename: PROT,ALBUMIN,AST,ALT,ALKPHOS,BILITOT,BILIDIR,IBILI in the last 72 hours No results found for this basename: CHOL in the last 72 hours No results found for this basename: PROTIME in the last 72 hours  Imaging:  Cardiac Studies:  Assessment/Plan:  Status post out of hospital V. fib cardiac arrest Status post inferior wall/RV infarct Status post hypotensive shock Status post vent dependent respiratory failure Hypertension Diabetes  mellitus Hypercholesteremia Mild volume overload Mild encephalopathy improving Anemia Hypokalemia Plan And IV Lasix and K. dur Increase ambulation Social service for discharge planning/inpatient rehabilitation  LOS: 6 days    Tanish Prien N 01/24/2011, 10:13 AM

## 2011-01-24 NOTE — Progress Notes (Signed)
I met with patient and her friend at bedside. I explained that her insurance company, [employer], will not approve inpatient acute rehabilitation here. They will approve SNF rehab. Patient's primary physician is Dr. Jacky Kindle and she would like to look into [facility] and [facility]. Please call with any questions. Pager [phone].

## 2011-01-24 NOTE — Progress Notes (Signed)
Clinical Social Worker completed a psychosocial assessment, which can be found in the shadow chart. CIR alerted CSW that patient's insurance will not approve inpatient rehab, but would approve SNF rehab. Patient wanted CSW to do a bed search for [facility] first before expanding to other areas. CSW completed an FL2, which will need the MD's signature and can be found in the shadow chart. CSW will update patient with the bed offers as they become available.   [name] MSW, [employer] [phone]

## 2011-01-25 LAB — BASIC METABOLIC PANEL
CO2: 31 mEq/L (ref 19–32)
GFR calc non Af Amer: 85 mL/min — ABNORMAL LOW (ref >90)
Glucose, Bld: 107 mg/dL — ABNORMAL HIGH (ref 70–99)
Potassium: 3.3 mEq/L — ABNORMAL LOW (ref 3.5–5.1)
Sodium: 143 mEq/L (ref 135–145)

## 2011-01-25 LAB — CARDIAC PANEL(CRET KIN+CKTOT+MB+TROPI): Relative Index: 3.9 — ABNORMAL HIGH (ref 0.0–2.5)

## 2011-01-25 LAB — CBC
HCT: 32.1 % — ABNORMAL LOW (ref 36.0–46.0)
Hemoglobin: 10.6 g/dL — ABNORMAL LOW (ref 12.0–15.0)
MCH: 28.5 pg (ref 26.0–34.0)
MCHC: 33 g/dL (ref 30.0–36.0)
RBC: 3.72 MIL/uL — ABNORMAL LOW (ref 3.87–5.11)

## 2011-01-25 MED ORDER — METOPROLOL TARTRATE 25 MG PO TABS
25.0000 mg | ORAL_TABLET | Freq: Two times a day (BID) | ORAL | Status: DC
  Administered 2011-01-25 – 2011-01-27 (×6): 25 mg via ORAL
  Filled 2011-01-25 (×7): qty 1

## 2011-01-25 MED ORDER — POTASSIUM CHLORIDE CRYS ER 20 MEQ PO TBCR
40.0000 meq | EXTENDED_RELEASE_TABLET | Freq: Every day | ORAL | Status: DC
  Administered 2011-01-25 – 2011-01-31 (×7): 40 meq via ORAL
  Filled 2011-01-25 (×8): qty 2

## 2011-01-25 MED ORDER — ENSURE IMMUNE HEALTH PO LIQD
237.0000 mL | Freq: Three times a day (TID) | ORAL | Status: DC
  Administered 2011-01-25 – 2011-01-26 (×2): via ORAL
  Administered 2011-01-26 – 2011-01-28 (×4): 237 mL via ORAL

## 2011-01-25 NOTE — Progress Notes (Signed)
Subjective:  Patient denies any chest pain states her breathing and arm and leg swelling has improved Denies any palpitation or lightheadedness Activity is still limited with unsteady gait  Objective:  Vital Signs in the last 24 hours: Temp:  [97.7 F (36.5 C)-98.9 F (37.2 C)] 98.9 F (37.2 C) (11/29 0825) Pulse Rate:  [91-106] 94  (11/29 0330) Resp:  [18-24] 18  (11/29 0800) BP: (139-166)/(68-98) 159/98 mmHg (11/29 0330) SpO2:  [93 %-100 %] 96 % (11/29 0800) Weight:  [94.7 kg (208 lb 12.4 oz)] 208 lb 12.4 oz (94.7 kg) (11/29 0528)  Intake/Output from previous day: 11/28 0701 - 11/29 0700 In: 840 [P.O.:820; I.V.:20] Out: 3405 [Urine:3401; Stool:4] Intake/Output from this shift: Total I/O In: 240 [P.O.:240] Out: 151 [Urine:150; Stool:1]  Physical Exam: General appearance: alert and cooperative Neck: no adenopathy, no carotid bruit, no JVD and supple, symmetrical, trachea midline Lungs: diminished breath sounds bilaterally Heart: regular rate and rhythm, S1, S2 normal, no murmur, click, rub or gallop Abdomen: soft, non-tender; bowel sounds normal; no masses,  no organomegaly Extremities: edema 1+ edema  Lab Results:  Basename 01/25/11 0500 01/24/11 0409  WBC 8.6 7.6  HGB 10.6* 9.5*  PLT 201 164    Basename 01/25/11 0500 01/24/11 0409  NA 143 141  K 3.3* 3.4*  CL 102 104  CO2 31 29  GLUCOSE 107* 80  BUN 20 21  CREATININE 0.73 0.74    Basename 01/25/11 0500 01/24/11 0409  TROPONINI 2.09* 2.92*   Hepatic Function Panel No results found for this basename: PROT,ALBUMIN,AST,ALT,ALKPHOS,BILITOT,BILIDIR,IBILI in the last 72 hours No results found for this basename: CHOL in the last 72 hours No results found for this basename: PROTIME in the last 72 hours  Imaging:   Cardiac Studies:  Assessment/Plan:  Status post out of hospital V. fib cardiac arrest Status post inferior wall/RV infarct Status post hypotensive shock Status post vent dependent respiratory  failure Hypertension Diabetes mellitus Mild volume overload improved with IV Lasix Mild hypoxic encephalopathy with cognitive dysfunction improving Anemia stable Hypokalemia Plan Continue present management Cognitive evaluation  consult Replace K. Increase ambulation Check labs in a.m.  LOS: 7 days    Rachel Chase N 01/25/2011, 9:34 AM

## 2011-01-25 NOTE — Progress Notes (Signed)
PT Cancellation Note  Pt politely refusing ambulation secondary to having just gotten back to bed after ambulation with cardiac rehab. Will f/u. Ivonne Andrew PT, DPT [phone]

## 2011-01-25 NOTE — Progress Notes (Signed)
CARDIAC REHAB PHASE I   PRE:  Rate/Rhythm: 105 ST PVCs  BP:  Supine:   Sitting: 161/93  Standing:    SaO2: 96%RA  MODE:  Ambulation: 25 ft   POST:  Rate/Rhythem: 110ST PVCs  BP:  Supine:   Sitting: 165/60  Standing:    SaO2: 94%RA  Pt walked 25 ft on RA with rolling walker and asst x 2.  Followed with chair. Pt very impulsive, looking around, and letting go of walker at times.  Had to sit down at door to rest. [facility] a little farther and sat again. Rolled pt back to room in recliner. Encouraged to stand upright with posture. Some SOB noted but sats ok. Encouraged pt not to attempt walks without staff help. Family in room.  [phone]  Duanne Limerick

## 2011-01-25 NOTE — Plan of Care (Signed)
Problem: Phase III Progression Outcomes Goal: Mental status at or near baseline Outcome: Progressing Pt impulsive, impaired problem solving, decreased attention

## 2011-01-25 NOTE — Progress Notes (Addendum)
Clinical Social Worker spoke with patient, patient's son Kathlene November), and patient's sister Maralyn Sago) to update the family on the bed offer received. They are agreeable to the SNF, [facility], for short term rehab. The patient's sister Maralyn Sago) stated that the family is also looking into an apartment for the patient to reside in once rehab is completed. Sister stated that this was discussed with the patient and decided upon prior to patient's admission to the hospital.   Placement note can be found in shadow chart.  [name] MSW, [employer] [phone]

## 2011-01-25 NOTE — Progress Notes (Signed)
Nutrition Follow-up  Meds:     . aspirin EC  81 mg Oral Daily  . citric acid-sodium citrate  30 mL Oral Once  . enoxaparin (LOVENOX) injection  40 mg Subcutaneous Q24H  . furosemide  40 mg Intravenous Daily  . levalbuterol  0.63 mg Nebulization Q8H  . metoprolol tartrate  25 mg Oral BID  . potassium chloride  40 mEq Oral Daily  . rosuvastatin  20 mg Oral QHS  . DISCONTD: metoprolol tartrate  12.5 mg Oral BID  . DISCONTD: potassium chloride  20 mEq Oral Daily   Labs:  CMP     Component Value Date/Time   NA 143 01/25/2011 0500   K 3.3* 01/25/2011 0500   CL 102 01/25/2011 0500   CO2 31 01/25/2011 0500   GLUCOSE 107* 01/25/2011 0500   BUN 20 01/25/2011 0500   CREATININE 0.73 01/25/2011 0500   CALCIUM 8.2* 01/25/2011 0500   PROT 6.4 01/18/2011 1620   ALBUMIN 3.2* 01/18/2011 1620   AST 142* 01/18/2011 1620   ALT 156* 01/18/2011 1620   ALKPHOS 67 01/18/2011 1620   BILITOT 0.3 01/18/2011 1620   GFRNONAA 85* 01/25/2011 0500   GFRAA >90 01/25/2011 0500   Wt. Status  94.7 kg, trending back down; BMI=34.7 (obesity, unspecified)  Diet:  Heart Healthy with thin liquids; 25-50% meal completion  Nutr dx:  Inadequate oral intake, now related to poor appetite as evidenced by 25-50% meal completion  New Goal:  PO intake to meet at least 90% of estimated needs, unmet.  Intervention:  Ensure TID to maximize oral intake.  Monitor:  Supplement tolerance, labs, PO intake   Pager #:  [phone]

## 2011-01-25 NOTE — Progress Notes (Signed)
Speech Language Pathology  SLP Cancellation Note Received cognitive evaluation order.  Patient currently receiving nursing care and not available.  Will re-attempt this afternoon or earlier in am on 01/26/11.   Aware of OT and CIR consulting physician's notes on new cognitive impairment likely due to hypoxia/anoxia.  Myra Rude, M.S.,CCC-SLP Pager [phone]

## 2011-01-25 NOTE — Progress Notes (Signed)
Occupational Therapy Treatment  Patient Details Name: Rachel Chase MRN: [id number] DOB: 11-25-1940 Today's Date: 01/25/2011  OT Assessment/Plan OT Assessment/Plan Comments on Treatment Session: Pt. with improved activity tolerance, but still fatigues relatively quickly.  Pt. continues with significant cognitive defict.  Pt. will likely need long term 24 hour supervision OT Plan: Discharge plan needs to be updated OT Frequency: Min 2X/week Follow Up Recommendations: Skilled nursing facility Equipment Recommended: Defer to next venue OT Goals ADL Goals ADL Goal: Grooming - Progress: Progressing toward goals ADL Goal: Lower Body Bathing - Progress: Progressing toward goals ADL Goal: Toilet Transfer - Progress: Progressing toward goals ADL Goal: Toileting - Clothing Manipulation - Progress: Progressing toward goals ADL Goal: Toileting - Hygiene - Progress: Progressing toward goals  OT Treatment Precautions/Restrictions  Precautions Precautions: Fall   ADL ADL Grooming: Performed;Brushing hair;Supervision/safety;Moderate assistance (supervision for hair; mod A to wash hands-see comments) Grooming Details (indicate cue type and reason): min cues for attention Where Assessed - Grooming: Sitting, chair Lower Body Dressing: Minimal assistance (to don socks.  Min verbal cues for sustained attention) Lower Body Dressing Details (indicate cue type and reason): Pt. requires min A to problem solve through difficulties Where Assessed - Lower Body Dressing: Sitting, chair Toilet Transfer: Performed;Minimal assistance (Mod verbal cues for hand placement, and impulsivity) Toilet Transfer Details (indicate cue type and reason): Pt very impulsive requiring physical assist and verbal cues (Pt. performed transfer x 2 during session) Toilet Transfer Method: Stand pivot Toilet Transfer Equipment: Bedside commode Toileting - Clothing Manipulation: Performed;Moderate assistance (gown) Where Assessed -  Toileting Clothing Manipulation: Standing Toileting - Hygiene: Performed;Maximal assistance (verbal cues and assist for impulsivity and thoroughness) Toileting - Hygiene Details (indicate cue type and reason): Pt. with loose stool Where Assessed - Toileting Hygiene: Standing Equipment Used:  (BSC) ADL Comments: Pt with stool on hands.  Provided with washcloth to wash hands, repetetively washed face, and ignored stool on hands, then washed hands partially and brought cloth to face/mouth to wipe mouth.  Therapist completed task for pt. safety Mobility  Transfers Transfers: Yes Sit to Stand: 4: Min assist Sit to Stand Details (indicate cue type and reason): See toileting comments Stand to Sit: 4: Min assist Exercises    End of Session OT - End of Session Activity Tolerance: Patient limited by fatigue Patient left: in chair;with call bell in reach (sitter present) Nurse Communication:  (O2 sats on RA) General Behavior During Session:  (impulsive) Cognition: Impaired Cognitive Impairment: Pt requires cues/assist for attention; problem solving, and safety due to impulsivity  Rachel Chase, Rachel Chase  01/25/2011, 12:25 PM

## 2011-01-25 NOTE — Progress Notes (Signed)
Speech Language Pathology  Attempt to complete SLE.  Pt unavailable at this time.  Will continue efforts.  Aparna Vanderweele B. Jermiah Soderman, MSP, CCC-SLP [phone]

## 2011-01-26 LAB — BASIC METABOLIC PANEL
CO2: 33 mEq/L — ABNORMAL HIGH (ref 19–32)
Calcium: 8 mg/dL — ABNORMAL LOW (ref 8.4–10.5)
Chloride: 102 mEq/L (ref 96–112)
Glucose, Bld: 100 mg/dL — ABNORMAL HIGH (ref 70–99)
Sodium: 142 mEq/L (ref 135–145)

## 2011-01-26 LAB — CBC
Hemoglobin: 10.6 g/dL — ABNORMAL LOW (ref 12.0–15.0)
MCH: 27.9 pg (ref 26.0–34.0)
MCV: 86.8 fL (ref 78.0–100.0)
Platelets: 225 10*3/uL (ref 150–400)
RBC: 3.8 MIL/uL — ABNORMAL LOW (ref 3.87–5.11)
WBC: 10.1 10*3/uL (ref 4.0–10.5)

## 2011-01-26 LAB — GLUCOSE, CAPILLARY
Glucose-Capillary: 106 mg/dL — ABNORMAL HIGH (ref 70–99)
Glucose-Capillary: 105 mg/dL — ABNORMAL HIGH (ref 70–99)

## 2011-01-26 LAB — MAGNESIUM: Magnesium: 1.7 mg/dL (ref 1.5–2.5)

## 2011-01-26 MED ORDER — FUROSEMIDE 10 MG/ML IJ SOLN
40.0000 mg | Freq: Once | INTRAMUSCULAR | Status: DC
  Filled 2011-01-26: qty 4

## 2011-01-26 NOTE — Progress Notes (Signed)
CARDIAC REHAB PHASE I   PRE:  Rate/Rhythm: 103 ST  BP:  Supine:   Sitting: 144/78  Standing:    SaO2: 96 RA  MODE:  Ambulation: 160 ft   POST:  Rate/Rhythem: 110 ST  BP:  Supine:   Sitting: 132/105 111/81  Standing:    SaO2: 97 RA 1505-1530 Assisted X 2 and used walker and gait belt to ambulate. Pt continues with same behavior impulsive and very easily distracted.  Increased distance. To wheelchair after walk going to new room. Beatrix Fetters

## 2011-01-26 NOTE — Progress Notes (Signed)
  Speech Language Pathology Cognitive-Linguistic Evaluation  Clinical Impressions:  Pt presents with impaired cognition specific to decreased, but improving, short-term memory and difficulty with higher-level attention.  Primary deficits are related to high impulsivity, poor self-regulation, disinhibition.  Pt tends to act upon tasks impulsively, without stopping to evaluate steps, determine potential outcomes, or cease activity when safety is compromised.  Pt is pleasant, talkative with decreased topic maintenance and high tangentiality.  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  Pt would benefit from skilled SLP services in acute care setting to address above deficits.  Rec f/u SLP in long-term rehab setting.  SLP Recommendation/Assessment: Patient will need skilled Speech Lanaguage Pathology Services in the acute care venue to address identified deficits I Consulted and Agree with Results and Recommendations: Patient;Family Adult nurse (sisters) Family Member Consulted : sisters   Pain:  None identified   Rachel Chase L. Rachel Chase, [place] CCC/SLP Pager [phone]  Rachel Chase 01/26/2011, 12:46 PM

## 2011-01-26 NOTE — Progress Notes (Signed)
Physical Therapy Treatment Patient Details Name: Rachel Chase MRN: [id number] DOB: [birth year] Today's Date: 01/26/2011  PT Assessment/Plan  PT - Assessment/Plan Comments on Treatment Session: Pt tolerated treatment session well.  Pt tolerated an increase in ambulation, but required +2 A for balance, stability, and proper RW use.  Pt required multiple verbal cues for proper RW use and gait. PT Plan: Discharge plan remains appropriate;Frequency remains appropriate PT Frequency: Min 3X/week Follow Up Recommendations: Inpatient Rehab Equipment Recommended: Defer to next venue PT Goals  Acute Rehab PT Goals PT Goal: Rolling Supine to Right Side - Progress: Other (comment) (not assessed) PT Goal: Supine/Side to Sit - Progress: Other (comment) (not assessed) PT Goal: Sit to Supine/Side - Progress: Other (comment) (not assessed) PT Transfer Goal: Sit to Stand/Stand to Sit - Progress: Progressing toward goal PT Transfer Goal: Bed to Chair/Chair to Bed - Progress: Other (comment) (not assessed) PT Goal: Ambulate - Progress: Progressing toward goal PT Goal: Up/Down Stairs - Progress: Not met  PT Treatment Precautions/Restrictions  Precautions Precautions: Fall Required Braces or Orthoses: No Restrictions Weight Bearing Restrictions: No Mobility (including Balance) Bed Mobility Bed Mobility: No Transfers Transfers: Yes Sit to Stand: 4: Min assist;With upper extremity assist;From toilet;With armrests;From chair/3-in-1 Sit to Stand Details (indicate cue type and reason): Verbal cues for hand placement. A to maintain balance.  Pt very impulsive with sit to stand and unsteady. Stand to Sit: 4: Min assist;With upper extremity assist;To chair/3-in-1;To toilet;With armrests Stand to Sit Details: Verbal cues for hand placement.  A for eccentric control during descent - pt "flops" down in chair despite verbal cues. Ambulation/Gait Ambulation/Gait: Yes Ambulation/Gait Assistance: 1: +2 Total  assist;Patient percentage (comment) (pt 80%) Ambulation/Gait Assistance Details (indicate cue type and reason): +2 A due to pt impulsivity and decreased balance.  Repeated verbal cues for upright posture, looking forward (pt kept looking to sides), and proper RW use.  Pt very impulsive with RW, pushing it much too far anteriorly.  Verbal cues to stay inside walker and keep walker going in a straight directoin.  Pt tended to "weave" back and forth with RW, so PT on either side for control and balance. Ambulation Distance (Feet): 80 Feet Assistive device: Rolling walker Gait Pattern: Decreased stride length;Ataxic;Trunk flexed Gait velocity: Decreased gait speed, but unmeasurable secondary to pt "starting and stopping walking," staggering, requiring 2+ A to keep her on track. Stairs: No Wheelchair Mobility Wheelchair Mobility: No  Posture/Postural Control Posture/Postural Control: No significant limitations Balance Balance Assessed: No Exercise    End of Session PT - End of Session Equipment Utilized During Treatment: Gait belt;Other (comment) (RW) Activity Tolerance: Patient limited by fatigue;Patient tolerated treatment well Patient left: in chair;with call bell in reach;with family/visitor present Nurse Communication: Mobility status for transfers;Mobility status for ambulation General Behavior During Session: Restless Cognition: Impaired, at baseline Cognitive Impairment: Pt is very pleasant, but impulsive, requiring multiple cues for redirection to tasks.  Lacinda Axon 01/26/2011, 9:45 AM

## 2011-01-26 NOTE — Progress Notes (Signed)
Subjective:  Patient denies any chest pain or shortness of breath continues to have a leg swelling  Objective:  Vital Signs in the last 24 hours: Temp:  [97.6 F (36.4 C)-99 F (37.2 C)] 97.6 F (36.4 C) (11/30 1115) Pulse Rate:  [96-104] 96  (11/30 1115) Resp:  [18-20] 18  (11/29 1915) BP: (109-164)/(59-90) 109/60 mmHg (11/30 1115) SpO2:  [93 %-100 %] 96 % (11/30 1115) Weight:  [93 kg (205 lb 0.4 oz)] 205 lb 0.4 oz (93 kg) (11/30 0500)  Intake/Output from previous day: 11/29 0701 - 11/30 0700 In: 440 [P.O.:440] Out: 2478 [Urine:2475; Stool:3] Intake/Output from this shift: Total I/O In: 240 [P.O.:240] Out: -   Physical Exam: General appearance: alert and cooperative Neck: no adenopathy, no carotid bruit and no JVD Lungs: diminished breath sounds bibasilar Heart: regular rate and rhythm, S1, S2 normal, no murmur, click, rub or gallop Abdomen: soft, non-tender; bowel sounds normal; no masses,  no organomegaly Extremities: edema 2+ edema  Lab Results:  Basename 01/26/11 0530 01/25/11 0500  WBC 10.1 8.6  HGB 10.6* 10.6*  PLT 225 201    Basename 01/26/11 0530 01/25/11 0500  NA 142 143  K 3.8 3.3*  CL 102 102  CO2 33* 31  GLUCOSE 100* 107*  BUN 21 20  CREATININE 0.73 0.73    Basename 01/25/11 0500 01/24/11 0409  TROPONINI 2.09* 2.92*   Hepatic Function Panel No results found for this basename: PROT,ALBUMIN,AST,ALT,ALKPHOS,BILITOT,BILIDIR,IBILI in the last 72 hours No results found for this basename: CHOL in the last 72 hours No results found for this basename: PROTIME in the last 72 hours  Imaging:   Cardiac Studies:  Assessment/Plan:  Status post out of hospital V. fib arrest Status post inferior wall/RV infarct Status post hypotensive shock Status post vent dependent respiratory failure Hypertension Mild encephalopathy with cognitive dysfunction Mild volume overload Plan Continue present management Social service for discharge planning Transfer  to telemetry TED stockings Elevate legs 30 while in bed  LOS: 8 days    Javaya [place] N 01/26/2011, 12:43 PM

## 2011-01-27 LAB — BASIC METABOLIC PANEL
CO2: 35 mEq/L — ABNORMAL HIGH (ref 19–32)
Chloride: 99 mEq/L (ref 96–112)
Creatinine, Ser: 0.79 mg/dL (ref 0.50–1.10)
GFR calc Af Amer: >90 mL/min (ref >90)
Potassium: 3.7 mEq/L (ref 3.5–5.1)
Sodium: 142 mEq/L (ref 135–145)

## 2011-01-27 LAB — CBC
MCV: 86.8 fL (ref 78.0–100.0)
Platelets: 299 10*3/uL (ref 150–400)
RBC: 3.93 MIL/uL (ref 3.87–5.11)
RDW: 13.1 % (ref 11.5–15.5)
WBC: 10.9 10*3/uL — ABNORMAL HIGH (ref 4.0–10.5)

## 2011-01-27 MED ORDER — LEVALBUTEROL HCL 0.63 MG/3ML IN NEBU
0.6300 mg | INHALATION_SOLUTION | Freq: Two times a day (BID) | RESPIRATORY_TRACT | Status: DC
  Administered 2011-01-27 – 2011-01-31 (×9): 0.63 mg via RESPIRATORY_TRACT
  Filled 2011-01-27 (×11): qty 3

## 2011-01-27 MED ORDER — MOXIFLOXACIN HCL 400 MG PO TABS
400.0000 mg | ORAL_TABLET | Freq: Every day | ORAL | Status: DC
  Administered 2011-01-27 – 2011-01-30 (×4): 400 mg via ORAL
  Filled 2011-01-27 (×5): qty 1

## 2011-01-27 MED ORDER — FUROSEMIDE 10 MG/ML IJ SOLN
40.0000 mg | Freq: Two times a day (BID) | INTRAMUSCULAR | Status: DC
  Administered 2011-01-27 – 2011-01-29 (×4): 40 mg via INTRAVENOUS
  Filled 2011-01-27 (×6): qty 4

## 2011-01-27 NOTE — Progress Notes (Signed)
Subjective:  Patient complains of cough with yellowish phlegm and sore throat Denies any fever and chills Leg and arm swelling gradually improving Denies any chest pain or shortness of breath  Objective:  Vital Signs in the last 24 hours: Temp:  [98 F (36.7 C)-98.2 F (36.8 C)] 98 F (36.7 C) (12/01 0453) Pulse Rate:  [94-97] 97  (12/01 0453) Resp:  [16-20] 16  (12/01 0453) BP: (150-174)/(78-95) 150/86 mmHg (12/01 0453) SpO2:  [93 %-99 %] 95 % (12/01 0904) Weight:  [89.767 kg (197 lb 14.4 oz)] 197 lb 14.4 oz (89.767 kg) (12/01 0453)  Intake/Output from previous day: 11/30 0701 - 12/01 0700 In: 1690 [P.O.:1690] Out: 3626 [Urine:3625; Stool:1] Intake/Output from this shift: Total I/O In: 360 [P.O.:360] Out: 1301 [Urine:1300; Stool:1]  Physical Exam: General appearance: alert and cooperative Neck: no adenopathy, no carotid bruit, no JVD and supple, symmetrical, trachea midline Lungs: clear to auscultation bilaterally Heart: regular rate and rhythm, S1, S2 normal, no murmur, click, rub or gallop Abdomen: soft, non-tender; bowel sounds normal; no masses,  no organomegaly Extremities: No clubbing cyanosis there is 2+ edema  Lab Results:  Basename 01/27/11 0500 01/26/11 0530  WBC 10.9* 10.1  HGB 11.2* 10.6*  PLT 299 225    Basename 01/27/11 0500 01/26/11 0530  NA 142 142  K 3.7 3.8  CL 99 102  CO2 35* 33*  GLUCOSE 154* 100*  BUN 18 21  CREATININE 0.79 0.73    Basename 01/25/11 0500  TROPONINI 2.09*   Hepatic Function Panel No results found for this basename: PROT,ALBUMIN,AST,ALT,ALKPHOS,BILITOT,BILIDIR,IBILI in the last 72 hours No results found for this basename: CHOL in the last 72 hours No results found for this basename: PROTIME in the last 72 hours  Imaging:  Cardiac Studies:  Assessment/Plan:  Status post out of hospital V. fib cardiac arrest Status post inferior wall/RV infarct Hypertension Diabetes mellitus Mild encephalopathy with cognitive  dysfunction Bronchitis Mild volume overload but improved Plan as per orders  LOS: 9 days    Tyreisha Ungar N 01/27/2011, 11:54 AM

## 2011-01-27 NOTE — Progress Notes (Signed)
CARDIAC REHAB PHASE I   PRE:  Rate/Rhythm: 88SR  BP:  Supine:   Sitting: 138/80  Standing:    SaO2: 95%RA  MODE:  Ambulation: 140 ft   POST:  Rate/Rhythem: 93  BP:  Supine: 142/78  Sitting:   Standing:    SaO2: 94%RA  1435-1500 Pt walked 140 ft with asst x 2 on RA and rolling walker. Gait belt used. Pt encouraged to stay close to walker. By end of wlk, pt tired. C/o SOB. O2 sats at 94%RA after walk.  Back to bed. Still a little impulsive.  Duanne Limerick

## 2011-01-28 MED ORDER — METOPROLOL TARTRATE 25 MG PO TABS
25.0000 mg | ORAL_TABLET | Freq: Three times a day (TID) | ORAL | Status: DC
  Administered 2011-01-28 – 2011-01-29 (×4): 25 mg via ORAL
  Filled 2011-01-28 (×6): qty 1

## 2011-01-28 NOTE — Progress Notes (Signed)
Subjective:  Patient denies any chest pain or shortness of breath States her throat feels better after starting antibiotics Her leg swelling also has gradually improved  Objective:  Vital Signs in the last 24 hours: Temp:  [98.1 F (36.7 C)-98.3 F (36.8 C)] 98.1 F (36.7 C) (12/02 0448) Pulse Rate:  [90-98] 90  (12/02 0448) Resp:  [18-20] 19  (12/02 0448) BP: (116-151)/(73-93) 140/73 mmHg (12/02 0448) SpO2:  [93 %-99 %] 96 % (12/02 0756) Weight:  [87.454 kg (192 lb 12.8 oz)] 192 lb 12.8 oz (87.454 kg) (12/02 0500)  Intake/Output from previous day: 12/01 0701 - 12/02 0700 In: 840 [P.O.:840] Out: 4352 [Urine:4350; Stool:2] Intake/Output from this shift: Total I/O In: 240 [P.O.:240] Out: 400 [Urine:400]  Physical Exam: General appearance: alert and cooperative Neck: no adenopathy, no carotid bruit, no JVD and supple, symmetrical, trachea midline Lungs: clear to auscultation bilaterally Heart: regular rate and rhythm, S1, S2 normal, no murmur, click, rub or gallop Abdomen: soft, non-tender; bowel sounds normal; no masses,  no organomegaly Extremities: edema 1+ edema  Lab Results:  Basename 01/27/11 0500 01/26/11 0530  WBC 10.9* 10.1  HGB 11.2* 10.6*  PLT 299 225    Basename 01/27/11 0500 01/26/11 0530  NA 142 142  K 3.7 3.8  CL 99 102  CO2 35* 33*  GLUCOSE 154* 100*  BUN 18 21  CREATININE 0.79 0.73   No results found for this basename: TROPONINI:2,CK,MB:2 in the last 72 hours Hepatic Function Panel No results found for this basename: PROT,ALBUMIN,AST,ALT,ALKPHOS,BILITOT,BILIDIR,IBILI in the last 72 hours No results found for this basename: CHOL in the last 72 hours No results found for this basename: PROTIME in the last 72 hours  Imaging:   Cardiac Studies:  Assessment/Plan:  Status post out of hospital V. fib cardiac arrest Status post inferior wall/RV infarct Status post hypotensive shock status post vent dependent respiratory failure Resolving  bronchitis Hypertension Hypercholesteremia Mild encephalopathy with the cognitive dysfunction improved Plan Increase beta blockers as per orders Increase ambulation Possible discharge in a.m. if her rehabilitation place available  LOS: 10 days    Rachel Chase N 01/28/2011, 9:26 AM

## 2011-01-29 LAB — CBC
HCT: 34.9 % — ABNORMAL LOW (ref 36.0–46.0)
Hemoglobin: 11.5 g/dL — ABNORMAL LOW (ref 12.0–15.0)
MCH: 28.5 pg (ref 26.0–34.0)
MCHC: 33 g/dL (ref 30.0–36.0)
MCV: 86.6 fL (ref 78.0–100.0)

## 2011-01-29 LAB — DIFFERENTIAL
Basophils Relative: 0 % (ref 0–1)
Eosinophils Absolute: 0.2 10*3/uL (ref 0.0–0.7)
Monocytes Absolute: 0.9 10*3/uL (ref 0.1–1.0)
Monocytes Relative: 8 % (ref 3–12)

## 2011-01-29 LAB — BASIC METABOLIC PANEL
BUN: 12 mg/dL (ref 6–23)
Chloride: 102 mEq/L (ref 96–112)
Glucose, Bld: 97 mg/dL (ref 70–99)
Potassium: 3.3 mEq/L — ABNORMAL LOW (ref 3.5–5.1)

## 2011-01-29 MED ORDER — FUROSEMIDE 40 MG PO TABS
40.0000 mg | ORAL_TABLET | Freq: Two times a day (BID) | ORAL | Status: DC
  Administered 2011-01-29 – 2011-01-31 (×4): 40 mg via ORAL
  Filled 2011-01-29 (×6): qty 1

## 2011-01-29 MED ORDER — RAMIPRIL 2.5 MG PO CAPS
2.5000 mg | ORAL_CAPSULE | Freq: Every day | ORAL | Status: DC
  Administered 2011-01-29 – 2011-01-31 (×3): 2.5 mg via ORAL
  Filled 2011-01-29 (×3): qty 1

## 2011-01-29 MED ORDER — METOPROLOL TARTRATE 50 MG PO TABS
50.0000 mg | ORAL_TABLET | Freq: Two times a day (BID) | ORAL | Status: DC
  Administered 2011-01-30 – 2011-01-31 (×3): 50 mg via ORAL
  Filled 2011-01-29 (×5): qty 1

## 2011-01-29 NOTE — Progress Notes (Signed)
Occupational Therapy Treatment Patient Details Name: Rachel Chase MRN: [id number] DOB: April 21, 1940 Today's Date: 01/29/2011  OT Assessment/Plan OT Assessment/Plan Comments on Treatment Session: Pt. with improved activity tolerance, but still fatigues relatively quickly.  Pt. continues with significant cognitive defict.  Pt. will likely need long term 24 hour supervision OT Plan: Discharge plan remains appropriate OT Frequency: Min 1X/week Follow Up Recommendations: Skilled nursing facility Equipment Recommended: Defer to next venue OT Goals Acute Rehab OT Goals OT Goal Formulation: With patient Time For Goal Achievement: 2 weeks ADL Goals Pt Will Perform Eating: Independently ADL Goal: Eating - Progress: Progressing toward goals Pt Will Perform Grooming: with min assist;Standing at sink ADL Goal: Grooming - Progress: Progressing toward goals Pt Will Perform Upper Body Bathing: with supervision;with cueing (comment type and amount) ADL Goal: Upper Body Bathing - Progress: Progressing toward goals Pt Will Perform Lower Body Bathing: with min assist;Sit to stand from chair;Sit to stand from bed;with cueing (comment type and amount) ADL Goal: Lower Body Bathing - Progress: Not addressed Pt Will Transfer to Toilet: with min assist;Ambulation;Comfort height toilet ADL Goal: Toilet Transfer - Progress: Progressing toward goals Pt Will Perform Toileting - Clothing Manipulation: with min assist;Standing ADL Goal: Toileting - Clothing Manipulation - Progress: Met Pt Will Perform Toileting - Hygiene: with supervision ADL Goal: Toileting - Hygiene - Progress: Met Additional ADL Goal #1: Pt. will maintain selective attention with minimal verbal cues during familiar self care activities  OT Treatment Precautions/Restrictions  Precautions Precautions: Fall Restrictions Weight Bearing Restrictions: No   ADL ADL Grooming: Performed;Wash/dry hands;Brushing hair;Set up;Minimal  assistance Grooming Details (indicate cue type and reason): min verbal cues for attention and safety and min assist for balance while standing Where Assessed - Grooming: Standing at sink Upper Body Dressing: Performed;Set up Upper Body Dressing Details (indicate cue type and reason): With donning gown Where Assessed - Upper Body Dressing: Unsupported;Sitting, bed Toilet Transfer: Performed;Minimal assistance;Set up Toilet Transfer Details (indicate cue type and reason): Pt. very impulsive with transfer and requiring minimal assist to balance  Toilet Transfer Method: Stand pivot Toilet Transfer Equipment: Bedside commode Toileting - Clothing Manipulation: Performed;Set up Toileting - Clothing Manipulation Details (indicate cue type and reason): With moving gown Where Assessed - Toileting Clothing Manipulation: Standing Toileting - Hygiene: Performed;Set up Where Assessed - Toileting Hygiene: Standing Equipment Used: Rolling walker ADL Comments: Pt. with impulsivity, impaired attention, decreased problem solving, and impaired memory with ADL tasks and requiring frequent verbal cues to redirect attention during tasks. Mobility  Bed Mobility Bed Mobility: Yes Rolling Right: 5: Supervision Right Sidelying to Sit: 5: Supervision Sitting - Scoot to Edge of Bed: 5: Supervision Transfers Transfers: Yes Sit to Stand: 4: Min assist;With upper extremity assist;From bed Sit to Stand Details (indicate cue type and reason): cues for safe use of UEs Stand to Sit: 4: Min assist;With upper extremity assist;To chair/3-in-1;With armrests Stand to Sit Details: cues to control descent     End of Session OT - End of Session Equipment Utilized During Treatment: Gait belt Activity Tolerance: Patient tolerated treatment well Patient left: in chair;with call bell in reach Nurse Communication: Mobility status for transfers;Mobility status for ambulation General Behavior During Session:  Restless Cognition: Impaired, at baseline Cognitive Impairment: Pt is very pleasant, but impulsive, requiring multiple cues for redirection to tasks.  Co-treat with Flora Lipps, OTR/L Pager [phone]  01/29/2011, 10:24 AM

## 2011-01-29 NOTE — Progress Notes (Signed)
Subjective:  Denies chest pain or shortness of breath   cough and sore throat has improved  Objective:  Vital Signs in the last 24 hours: Temp:  [98.2 F (36.8 C)-98.4 F (36.9 C)] 98.2 F (36.8 C) (12/03 0402) Pulse Rate:  [84-93] 93  (12/03 0402) Resp:  [18] 18  (12/03 0402) BP: (116-133)/(67-74) 133/71 mmHg (12/03 0402) SpO2:  [95 %-100 %] 100 % (12/03 0848) Weight:  [80.196 kg (176 lb 12.8 oz)] 176 lb 12.8 oz (80.196 kg) (12/03 0500)  Intake/Output from previous day: 12/02 0701 - 12/03 0700 In: 480 [P.O.:480] Out: 2101 [Urine:2100; Stool:1] Intake/Output from this shift: Total I/O In: 240 [P.O.:240] Out: -   Physical Exam: General appearance: alert and cooperative Neck: no adenopathy, no carotid bruit, no JVD and supple, symmetrical, trachea midline Lungs: clear to auscultation bilaterally Heart: regular rate and rhythm, S1, S2 normal, no murmur, click, rub or gallop Abdomen: soft, non-tender; bowel sounds normal; no masses,  no organomegaly Extremities: No clubbing cyanosis 1+ edema improved  Lab Results:  Basename 01/29/11 0629 01/27/11 0500  WBC 11.2* 10.9*  HGB 11.5* 11.2*  PLT 354 299    Basename 01/29/11 0629 01/27/11 0500  NA 144 142  K 3.3* 3.7  CL 102 99  CO2 32 35*  GLUCOSE 97 154*  BUN 12 18  CREATININE 0.82 0.79   No results found for this basename: TROPONINI:2,CK,MB:2 in the last 72 hours Hepatic Function Panel No results found for this basename: PROT,ALBUMIN,AST,ALT,ALKPHOS,BILITOT,BILIDIR,IBILI in the last 72 hours No results found for this basename: CHOL in the last 72 hours No results found for this basename: PROTIME in the last 72 hours  Imaging:   Cardiac Studies:  Assessment/Plan:  Status post V. fib out of hospital cardiac arrest Status post inferior wall/RV infarct Hypertension Diabetes mellitus Hypercholesteremia Mild encephalopathy with cognitive dysfunction Resolving bronchitis Plan Continue present management Change  IV Lasix to by mouth Awaiting skilled nursing facility  LOS: 11 days    Quindon Denker N 01/29/2011, 12:31 PM

## 2011-01-29 NOTE — Progress Notes (Signed)
CARDIAC REHAB PHASE I   PRE:  Rate/Rhythm: 89 SR    BP: sitting 120/66    SaO2: 98  RA  MODE:  Ambulation: 350 ft   POST:  Rate/Rhythm: 95    BP: sitting 140/66     SaO2: 97 RA  Much improved. Less distracted, only minimally impulsive. Can be x1 with RW. Tires easily still. x1 rest stop. Will fu. [id number]  Harriet Masson CES, ACSM

## 2011-01-29 NOTE — Progress Notes (Signed)
Physical Therapy Treatment Patient Details Name: Rachel Chase MRN: [id number] DOB: [birth year] Today's Date: 01/29/2011  PT Assessment/Plan  PT - Assessment/Plan Comments on Treatment Session: pt agreeable to mobility, however a little impulsive and with decreased safety awareness.   PT Plan: Discharge plan needs to be updated PT Frequency: Min 3X/week Follow Up Recommendations: Skilled nursing facility Equipment Recommended: Defer to next venue PT Goals  Acute Rehab PT Goals PT Goal: Rolling Supine to Right Side - Progress: Progressing toward goal PT Goal: Supine/Side to Sit - Progress: Progressing toward goal PT Goal: Sit to Supine/Side - Progress: Progressing toward goal PT Transfer Goal: Bed to Chair/Chair to Bed - Progress: Progressing toward goal PT Goal: Ambulate - Progress: Progressing toward goal PT Goal: Up/Down Stairs - Progress: Progressing toward goal  PT Treatment Precautions/Restrictions  Precautions Precautions: Fall Required Braces or Orthoses: No Restrictions Weight Bearing Restrictions: No Mobility (including Balance) Bed Mobility Bed Mobility: Yes Rolling Right: 5: Supervision Right Sidelying to Sit: 5: Supervision Sitting - Scoot to Edge of Bed: 5: Supervision Transfers Transfers: Yes Sit to Stand: 4: Min assist;With upper extremity assist;From bed Sit to Stand Details (indicate cue type and reason): cues for safe use of UEs Stand to Sit: 4: Min assist;With upper extremity assist;To chair/3-in-1;With armrests Stand to Sit Details: cues to control descent Stand Pivot Transfers: 4: Min assist Stand Pivot Transfer Details (indicate cue type and reason): Cues for safe technique Ambulation/Gait Ambulation/Gait: Yes Ambulation/Gait Assistance: 4: Min assist Ambulation/Gait Assistance Details (indicate cue type and reason): cues for positioning within RW, upright posture, attention to task Ambulation Distance (Feet): 90 Feet Assistive device: Rolling  walker Gait Pattern: Trunk flexed Stairs: No    Exercise    End of Session PT - End of Session Equipment Utilized During Treatment: Gait belt Activity Tolerance: Patient limited by fatigue Patient left: in chair;with call bell in reach Nurse Communication: Mobility status for transfers;Mobility status for ambulation General Behavior During Session: Restless Cognition: Impaired, at baseline Cognitive Impairment: Pt is very pleasant, but impulsive, requiring multiple cues for redirection to tasks.  Rachel Chase, [place] [phone] 01/29/2011, 10:23 AM

## 2011-01-29 NOTE — Progress Notes (Signed)
Clinical Social Worker spoke with patient about her choices with ST-SNF options and the patient prefers [facility] at this time. CSW provided this SNF with the necessary paperwork and called the facility as well, to confirm that they received the information. CSW will update patient along the way with responses from the facility and continue to follow for discharge planning.   [name] MSW, [employer] [phone]

## 2011-01-30 NOTE — Progress Notes (Signed)
Utilization review completed. Maize Brittingham, RN, BSN. 01/30/11  

## 2011-01-30 NOTE — Progress Notes (Signed)
Speech Pathology:  Treatment Note  Subjective:  Patient alert, pleasant, and cooperative  Objective:  Treatment with focus on high level cognitive skills and plan for d/c. Patient able to verbalize recollection of initial speech evaluation including demonstration of intellectual awareness regarding rationale for treatment/follow-up. Patient able to sustain attention to moderately complex conversation with min assist for topic maintenance secondary to mild-moderate verbosity and tangentiality. Patient able to verbalize anticipatory awareness of needs after d/c with min verbal cues.  Assessment:  Overall, patient making good progress with functional goals.   Recommendations:  Will continue to f/u. Recommend SLP f/u at SNF after d/c.   Pain:   none Intervention Required:   No   Goals: progressing  Ferdinand Lango MA, CCC-SLP [phone]

## 2011-01-30 NOTE — Progress Notes (Signed)
CARDIAC REHAB PHASE I   PRE:  Rate/Rhythm: 79 SR  BP:  Supine: 104/60  Sitting:   Standing:    SaO2: 97 RA  MODE:  Ambulation: 416 ft   POST:  Rate/Rhythem: 98  BP:  Supine:   Sitting: 130/64  Standing:    SaO2: 97 RA 1450-1510 Assisted X 1 and used walker to ambulate. Gait steady with walker. Seems less distracted  and impulsive today.Tired by end of walk, to bed with call light in reach. [facility] at bedside.  Beatrix Fetters

## 2011-01-30 NOTE — Progress Notes (Signed)
Occupational Therapy Treatment Patient Details Name: Rachel Chase MRN: [id number] DOB: [birth year] Today's Date: 01/30/2011  OT Assessment/Plan OT Assessment/Plan Comments on Treatment Session: Pt. much improved today and with less impulsivity and with increased activity tolerance with mobility and ADL tasks today.  OT Plan: Discharge plan remains appropriate OT Frequency: Min 1X/week Follow Up Recommendations: Skilled nursing facility Equipment Recommended: Defer to next venue OT Goals Acute Rehab OT Goals OT Goal Formulation: With patient Time For Goal Achievement: 2 weeks ADL Goals Pt Will Perform Eating: Independently ADL Goal: Eating - Progress: Progressing toward goals Pt Will Perform Grooming: with min assist;Standing at sink ADL Goal: Grooming - Progress: Met Pt Will Perform Upper Body Bathing: with supervision;with cueing (comment type and amount) ADL Goal: Upper Body Bathing - Progress: Not met Pt Will Perform Lower Body Bathing: with min assist;Sit to stand from chair;Sit to stand from bed;with cueing (comment type and amount) ADL Goal: Lower Body Bathing - Progress: Not addressed Pt Will Transfer to Toilet: with min assist;Ambulation;Comfort height toilet ADL Goal: Toilet Transfer - Progress: Met Pt Will Perform Toileting - Clothing Manipulation: with min assist;Standing ADL Goal: Toileting - Clothing Manipulation - Progress: Met Pt Will Perform Toileting - Hygiene: with supervision ADL Goal: Toileting - Hygiene - Progress: Met Additional ADL Goal #1: Pt. will maintain selective attention with minimal verbal cues during familiar self care activities  OT Treatment Precautions/Restrictions  Precautions Precautions: Fall Restrictions Weight Bearing Restrictions: No   ADL ADL Grooming: Performed;Brushing hair;Set up;Supervision/safety Grooming Details (indicate cue type and reason): Pt. only requiring min verbal cues for thoroughness Where Assessed - Grooming:  Standing at sink Upper Body Dressing: Performed;Set up Upper Body Dressing Details (indicate cue type and reason): With donning gown Where Assessed - Upper Body Dressing: Unsupported;Sitting, bed Toilet Transfer: Performed;Supervision/safety Toilet Transfer Details (indicate cue type and reason): Min verbal cues for hand placement on grab bar Toilet Transfer Method: Proofreader: Regular height toilet;Grab bars Toileting - Clothing Manipulation: Performed;Set up Toileting - Clothing Manipulation Details (indicate cue type and reason): With moving gown Where Assessed - Toileting Clothing Manipulation: Standing Toileting - Hygiene: Performed;Set up Where Assessed - Toileting Hygiene: Standing Tub/Shower Transfer: Not assessed Equipment Used: Rolling walker ADL Comments: Pt. less impulsive today and safe when manuvering walker around objects in the hallway and room wtihout LOB. Pt. able to navigate return to room after ~200 ambulation with supervision.  Mobility  Bed Mobility Bed Mobility: No Transfers Transfers: Yes Sit to Stand: 5: Supervision;With upper extremity assist;From bed Sit to Stand Details (indicate cue type and reason): Min verbal cues for hand placement on bed and not to pull on RW with both hands to increase safety during transfer Stand to Sit: 5: Supervision;To chair/3-in-1     End of Session OT - End of Session Equipment Utilized During Treatment: Gait belt Activity Tolerance: Patient tolerated treatment well Patient left: in chair;with call bell in reach Nurse Communication: Mobility status for transfers;Mobility status for ambulation General Behavior During Session: [facility] for tasks performed Cognition: Impaired, at baseline  Cassandria Anger, OTR/L Pager [phone]  01/30/2011, 10:47 AM

## 2011-01-30 NOTE — Progress Notes (Signed)
Subjective:  Patient denies any chest pain or shortness of breath States feels better awaiting skilled nursing facility  Objective:  Vital Signs in the last 24 hours: Temp:  [97 F (36.1 C)-98.3 F (36.8 C)] 98.3 F (36.8 C) (12/04 0545) Pulse Rate:  [86-90] 90  (12/04 0545) Resp:  [18-20] 18  (12/04 0545) BP: (91-124)/(51-77) 124/77 mmHg (12/04 0545) SpO2:  [95 %-98 %] 97 % (12/04 0801) FiO2 (%):  [21 %] 21 % (12/04 0801) Weight:  [80.74 kg (178 lb)] 178 lb (80.74 kg) (12/04 0545)  Intake/Output from previous day: 12/03 0701 - 12/04 0700 In: 240 [P.O.:240] Out: 2101 [Urine:2100; Stool:1] Intake/Output from this shift:    Physical Exam: General appearance: alert and cooperative Neck: no adenopathy, no carotid bruit, no JVD and supple, symmetrical, trachea midline Lungs: clear to auscultation bilaterally Heart: regular rate and rhythm, S1, S2 normal, no murmur, click, rub or gallop Abdomen: soft, non-tender; bowel sounds normal; no masses,  no organomegaly Extremities: extremities normal, atraumatic, no cyanosis or edema Pulses: 2+ and symmetric  Lab Results:  Basename 01/29/11 0629  WBC 11.2*  HGB 11.5*  PLT 354    Basename 01/29/11 0629  NA 144  K 3.3*  CL 102  CO2 32  GLUCOSE 97  BUN 12  CREATININE 0.82   No results found for this basename: TROPONINI:2,CK,MB:2 in the last 72 hours Hepatic Function Panel No results found for this basename: PROT,ALBUMIN,AST,ALT,ALKPHOS,BILITOT,BILIDIR,IBILI in the last 72 hours No results found for this basename: CHOL in the last 72 hours No results found for this basename: PROTIME in the last 72 hours  Imaging  Cardiac Studies:  Assessment/Plan:  Status post out of hospital V. fib arrest Status post inferior wall/RV infarct Hypertension Diabetes mellitus Resolving bronchitis Resolving encephalopathy with cognitive dysfunction Plan Continue present management Awaiting skilled nursing facility  LOS: 12 days     Laurencia Roma N 01/30/2011, 10:02 AM

## 2011-01-30 NOTE — Progress Notes (Signed)
Clinical Social Worker followed up with patient and sister Maralyn Sago) and they are agreeable to receive rehab at [facility], [place].  [name] MSW, [employer] [phone]

## 2011-01-31 LAB — BASIC METABOLIC PANEL
Calcium: 8.1 mg/dL — ABNORMAL LOW (ref 8.4–10.5)
Creatinine, Ser: 0.81 mg/dL (ref 0.50–1.10)
GFR calc non Af Amer: 72 mL/min — ABNORMAL LOW (ref >90)
Glucose, Bld: 113 mg/dL — ABNORMAL HIGH (ref 70–99)
Sodium: 142 mEq/L (ref 135–145)

## 2011-01-31 LAB — CBC
MCH: 28 pg (ref 26.0–34.0)
MCHC: 32 g/dL (ref 30.0–36.0)
Platelets: 408 10*3/uL — ABNORMAL HIGH (ref 150–400)

## 2011-01-31 MED ORDER — ROSUVASTATIN CALCIUM 20 MG PO TABS: 20.0000 mg | ORAL_TABLET | Freq: Every day | ORAL | Status: DC

## 2011-01-31 MED ORDER — RAMIPRIL 5 MG PO CAPS: 5.0000 mg | ORAL_CAPSULE | Freq: Every day | ORAL | Status: DC

## 2011-01-31 MED ORDER — NITROGLYCERIN 0.4 MG SL SUBL: 0.4000 mg | SUBLINGUAL_TABLET | SUBLINGUAL | Status: DC | PRN

## 2011-01-31 MED ORDER — POTASSIUM CHLORIDE CRYS ER 20 MEQ PO TBCR
40.0000 meq | EXTENDED_RELEASE_TABLET | Freq: Once | ORAL | Status: AC
  Administered 2011-01-31: 40 meq via ORAL

## 2011-01-31 MED ORDER — METOPROLOL TARTRATE 50 MG PO TABS: 50.0000 mg | ORAL_TABLET | Freq: Two times a day (BID) | ORAL | Status: DC

## 2011-01-31 MED ORDER — ASPIRIN 81 MG PO TBEC: 81.0000 mg | DELAYED_RELEASE_TABLET | Freq: Every day | ORAL | Status: DC

## 2011-01-31 NOTE — Progress Notes (Signed)
Clinical Social Worker facilitated patient discharge by contacting the family and facility and arranging transport to [facility] via ambulance.   [name] MSW, [employer] [phone]

## 2011-01-31 NOTE — Discharge Summary (Signed)
  Priority discharge summary dictated on 01/31/2011 dictation number is [phone]

## 2011-01-31 NOTE — Progress Notes (Signed)
Clinical Social Worker spoke with the SNF in regards to patient discharge and the facility is willing to wait until 5:00pm to receive the patient. Due to the discharge summary being dictated in e-chart, there is a delay in discharging the patient today and it could be possible that the patient will have to wait until tomorrow to be discharged.   [name] MSW, [employer] [phone]

## 2011-01-31 NOTE — Progress Notes (Signed)
Nutrition Follow-up  Awaiting SNF. Continues on Heart Healthy diet, intake has improved, now mostly 75-100%. Pt reports appetite has improved over the past few days.  Diet Order:  Heart Healthy with Ensure TID  Meds: Scheduled Meds:   . aspirin EC  81 mg Oral Daily  . citric acid-sodium citrate  30 mL Oral Once  . enoxaparin (LOVENOX) injection  40 mg Subcutaneous Q24H  . furosemide  40 mg Oral BID  . levalbuterol  0.63 mg Nebulization BID  . metoprolol tartrate  50 mg Oral BID  . moxifloxacin  400 mg Oral q1800  . potassium chloride  40 mEq Oral Daily  . ramipril  2.5 mg Oral Daily  . rosuvastatin  20 mg Oral QHS   Continuous Infusions:  PRN Meds:.sodium chloride, acetaminophen, guaiFENesin-dextromethorphan, levalbuterol, metoprolol, nitroGLYCERIN, ondansetron (ZOFRAN) IV, phenol, zolpidem  Labs:  CMP     Component Value Date/Time   NA 142 01/31/2011 0550   K 3.3* 01/31/2011 0550   CL 103 01/31/2011 0550   CO2 30 01/31/2011 0550   GLUCOSE 113* 01/31/2011 0550   BUN 13 01/31/2011 0550   CREATININE 0.81 01/31/2011 0550   CALCIUM 8.1* 01/31/2011 0550   PROT 6.4 01/18/2011 1620   ALBUMIN 3.2* 01/18/2011 1620   AST 142* 01/18/2011 1620   ALT 156* 01/18/2011 1620   ALKPHOS 67 01/18/2011 1620   BILITOT 0.3 01/18/2011 1620   GFRNONAA 72* 01/31/2011 0550   GFRAA 83* 01/31/2011 0550     Intake/Output Summary (Last 24 hours) at 01/31/11 0849 Last data filed at 01/31/11 0414  Gross per 24 hour  Intake    480 ml  Output   1300 ml  Net   -820 ml   Weight Status:  77.6 kg, wt returning to admit wt  Nutrition Dx:  Inadequate oral intake - resolved.  Goal:  PO intake to meet at least 90% of estimated needs  Intervention:   1. Continue current nutrition interventions, pt is eating well at this time.  Monitor:  Supplement tolerance, labs, PO intake   Adair Laundry Dietitian Pager #:  [phone]

## 2011-01-31 NOTE — Progress Notes (Signed)
This patient was discussed at LOS meeting today.  Larin Depaoli MSW, LCSWA [phone] 

## 2011-01-31 NOTE — Progress Notes (Signed)
[id number] Cardiac Rehab  Completed Mi education with pt.She voices understanding. Pt agrees to Outpt. CRP in [place], will send referral.

## 2011-01-31 NOTE — Discharge Summary (Signed)
NAMECORRETTA, MUNCE                ACCOUNT NO.:  [id number]  MEDICAL RECORD NO.:  [id number]  LOCATION:  2030                         FACILITY:  [facility]  PHYSICIAN:  Eduardo Osier. Sharyn Lull, M.D. DATE OF BIRTH:  1940-10-24  DATE OF ADMISSION:  01/18/2011 DATE OF DISCHARGE:  01/31/2011                              DISCHARGE SUMMARY   ADMITTING DIAGNOSES: 1. Status post out-of-hospital ventricular fibrillation cardiac     arrest. 2. Acute inferior wall myocardial infarction. 3. Hypokalemia. 4. Hypotensive shock. 5. Vent-dependent respiratory failure.  FINAL DIAGNOSES: 1. Status post out of hospital ventricular fibrillation cardiac     arrest. 2. Status post inferior wall/right ventricular infarct. 3. Status post attempted percutaneous coronary intervention to right     coronary artery. 4. Status post vent-dependent respiratory failure. 5. Hypertension. 6. Status post hypotensive shock. 7. Hypercholesteremia. 8. Non-insulin-dependent diabetes mellitus, controlled by diet. 9. Status post bronchitis. 10.Mild encephalopathy with cognitive dysfunction, slowly improving.  DISCHARGE HOME MEDICATIONS: 1. Enteric-coated aspirin 81 mg one tablet daily. 2. Metoprolol 50 mg one tablet twice daily. 3. Ramipril 5 mg one capsule daily. 4. Crestor 20 mg one tablet daily. 5. Nitrostat 0.4 mg sublingual use as directed.  DIET:  Low salt, low cholesterol, heart healthy, 1800 calories ADA diet.  ACTIVITY:  Increase activity slowly as tolerated.  DISPOSITION:  The patient will be discharged to skilled nursing facility in [place].  FOLLOWUP:  Follow with me in 1 week.  CONDITION ON DISCHARGE:  Stable.  BRIEF HISTORY AND HOSPITAL COURSE:  The patient is a 70 year old female with past medical history significant for hypertension, degenerative joint disease.  She came to the ER following out-of-hospital V-fib cardiac arrest, requiring defibrillation and intubation.  As per son, the patient  had chest pain approximately 4 days prior to the admission lasting for 3 hours, associated with shortness of breath and diaphoresis, but the patient did not seek any medical attention until November, 22 when in the kitchen, she suddenly collapsed and hit her head.  The patient denies any chest pain, shortness of breath, palpitation prior to her collapse as per her son.  Her son did CPR at home prior to EMS arrival and subsequently was found in V-fib requiring defibrillation x2 and was transferred to ED and was intubated.  Patient was hypotensive with blood pressure in 80s, unresponsive, intubated. The patient initially was taken to CT to rule out bleed and subsequently was emergently transferred to cardiac cath lab for emergency PCS.  PAST MEDICAL HISTORY:  As above.  PAST SURGICAL HISTORY:  None.  FAMILY HISTORY:  Noncontributory.  SOCIAL HISTORY:  No history of smoking or alcohol abuse.  ALLERGIES:  No known drug allergies.  PHYSICAL EXAMINATION:  VITAL SIGNS:  Her blood pressure was 88/52, pulse was 75.  GENERAL:  She was intubated, unresponsive.  HEENT:  Pupils were equal, round, reactive to light.  NECK:  Supple.  No JVD.  LUNGS:  Clear to auscultation anterolaterally.  Decreased breath sounds at bases. CARDIOVASCULAR:  S1, S2 was normal.  There was no friction rub.  No murmur.  ABDOMEN:  Soft.  Bowel sounds are present.  EXTREMITIES:  There is no clubbing, cyanosis,  or edema.  LABORATORY DATA:  Admission labs:  ABGs; pH was 7.24, pCO2 was 27, pO2 was 171.  Her blood sugar was 246, sodium was 135, potassium 3.8, BUN 27, creatinine 0.78.  Her cardiac enzymes; CK was 315, MB 42.4, troponin I was 6.84.  Repeat CK was 567, MB 98.6, troponin I 9.50.  Next set CK 616, MB 51.6, troponin I 10.11.  On November 28; CK 244, MB 6.4, troponin I 2.92.  On November 29; CK 183, MB 7.2, troponin I is 2.09 which is trending down.  Her labs today, hemoglobin is 11, hematocrit 34.4, white count  of 12.4, potassium is 3.3, which is being replaced. BUN is 13, creatinine 0.81, glucose is 113.  Her CT of the brain done in the ED showed minimal small-vessel chronic ischemic changes.  There was no evidence of bleed.  CT of cervical spine showed degenerative joint disease.  No acute cervical spine abnormalities.  Chest x-ray showed no acute pulmonary process.  BRIEF HOSPITAL COURSE:  The patient was admitted by CCM.  The patient was emergently taken to the cath lab and was placed on [employer].  The patient underwent emergency attempted PCI to RCA without success.  The patient's LV function was near normal.  The patient had type 2 dissection of the RCA.  Subsequent angiogram showed complete occlusion and no further extravasation of the contrast.  The patient had emergency 2D echo done in the cath lab, which showed no evidence of pericardial effusion or tamponade.  The patient was subsequently successfully extubated and was transferred on the floor.  The patient had mild encephalopathy with cognitive dysfunction, which has gradually improved over period of time.  The patient is ambulating in hallway without any problems.  The patient did require large boluses of IV fluids due to RV infarct and subsequently was weaned off the inotropes and subsequently required IV Lasix with good diuresis.  The patient did not had any episodes of V-fib during the hospital stay.  The patient remained hemodynamically stable and will be discharged to [facility] skilled nursing facility and will be followed up in my office in 1 week.     Eduardo Osier. Sharyn Lull, M.D.     MNH/MEDQ  D:  01/31/2011  T:  01/31/2011  Job:  [id number]

## 2011-02-01 NOTE — Progress Notes (Signed)
This patient was discussed at long LOS rounds 12.05.12  

## 2011-07-02 ENCOUNTER — Other Ambulatory Visit: Payer: Self-pay | Admitting: Internal Medicine

## 2011-07-02 DIAGNOSIS — Z1231 Encounter for screening mammogram for malignant neoplasm of breast: Secondary | ICD-10-CM

## 2011-07-26 ENCOUNTER — Ambulatory Visit
Admission: RE | Admit: 2011-07-26 | Discharge: 2011-07-26 | Disposition: A | Discharge status: Home / Self Care | Payer: Medicare Other | Source: Ambulatory Visit | Attending: Internal Medicine | Admitting: Internal Medicine

## 2011-07-26 DIAGNOSIS — Z1231 Encounter for screening mammogram for malignant neoplasm of breast: Secondary | ICD-10-CM

## 2011-08-01 ENCOUNTER — Other Ambulatory Visit: Payer: Self-pay | Admitting: Neurological Surgery

## 2011-08-01 ENCOUNTER — Other Ambulatory Visit (HOSPITAL_COMMUNITY): Payer: Self-pay | Admitting: Neurological Surgery

## 2011-08-01 DIAGNOSIS — M5416 Radiculopathy, lumbar region: Secondary | ICD-10-CM

## 2011-08-16 ENCOUNTER — Other Ambulatory Visit (HOSPITAL_COMMUNITY): Payer: Medicare Other

## 2011-08-17 ENCOUNTER — Encounter (HOSPITAL_COMMUNITY): Payer: Self-pay | Admitting: Pharmacy Technician

## 2011-08-22 ENCOUNTER — Ambulatory Visit (HOSPITAL_COMMUNITY)
Admission: RE | Admit: 2011-08-22 | Discharge: 2011-08-22 | Disposition: A | Discharge status: Home / Self Care | Payer: Medicare Other | Source: Ambulatory Visit | Attending: Neurological Surgery | Admitting: Neurological Surgery

## 2011-08-22 DIAGNOSIS — M5416 Radiculopathy, lumbar region: Secondary | ICD-10-CM

## 2011-08-22 DIAGNOSIS — M48061 Spinal stenosis, lumbar region without neurogenic claudication: Secondary | ICD-10-CM | POA: Insufficient documentation

## 2011-08-22 DIAGNOSIS — M5137 Other intervertebral disc degeneration, lumbosacral region: Secondary | ICD-10-CM | POA: Insufficient documentation

## 2011-08-22 DIAGNOSIS — M51379 Other intervertebral disc degeneration, lumbosacral region without mention of lumbar back pain or lower extremity pain: Secondary | ICD-10-CM | POA: Insufficient documentation

## 2011-08-22 DIAGNOSIS — M79609 Pain in unspecified limb: Secondary | ICD-10-CM | POA: Insufficient documentation

## 2011-08-22 DIAGNOSIS — M25559 Pain in unspecified hip: Secondary | ICD-10-CM | POA: Insufficient documentation

## 2011-08-22 MED ORDER — DIAZEPAM 5 MG PO TABS
10.0000 mg | ORAL_TABLET | Freq: Once | ORAL | Status: AC
  Administered 2011-08-22: 10 mg via ORAL

## 2011-08-22 MED ORDER — HYDROCODONE-ACETAMINOPHEN 5-325 MG PO TABS: 1.0000 | ORAL_TABLET | ORAL | Status: DC | PRN

## 2011-08-22 MED ORDER — IOHEXOL 180 MG/ML  SOLN: 14.0000 mL | Freq: Once | INTRAMUSCULAR | Status: DC | PRN

## 2011-08-22 MED ORDER — ONDANSETRON HCL 4 MG/2ML IJ SOLN: 4.0000 mg | Freq: Four times a day (QID) | INTRAMUSCULAR | Status: DC | PRN

## 2011-08-22 MED ORDER — DIAZEPAM 5 MG PO TABS
ORAL_TABLET | ORAL | Status: AC
  Administered 2011-08-22: 10 mg via ORAL
  Filled 2011-08-22: qty 2

## 2011-08-22 NOTE — Discharge Instructions (Signed)
Myelography Myelography is an X-ray test that uses a dye to look at your spine or neck. This test is usually done to look for:  Back pain.   Neck pain.   Arm or leg pain, weakness, or numbness.  BEFORE THE PROCEDURE  Take your medicine as told by your doctor.   Eat food and drink fluids as told by your doctor.   Do not drive. Have someone else drive you to the test and drive you home.   Your doctor will talk to you about risks of the myelography and answer questions you may have.  PROCEDURE  You will be awake during the procedure.   You may be asked to lie on your stomach during the procedure.   Your back will be cleaned.   A numbing medicine will be injected into that area.   A small needle is inserted through the skin that was numbed. A dye will be put into the needle.   The needle will be taken out.   X-ray pictures will be taken of your neck or back.   You may also have more procedures to get different views of your neck or back.  AFTER THE PROCEDURE  You will rest in a recovery room until you are stable and doing well.   You will lie down with your head resting on 1 pillow.   You may be given something to eat or drink.   Someone will need to drive you home.   Finding out the results of your test Ask when your test results will be ready. Make sure you get your test results.  Document Released: 11/22/2007 Document Revised: 02/01/2011 Document Reviewed: 11/22/2007 [employer] Patient Information 2012 [employer]. 

## 2011-08-22 NOTE — Progress Notes (Signed)
Discharged home; puncture site unremarkable; denies further pain 3/10 no change from admission.

## 2011-08-22 NOTE — Procedures (Signed)
Rachel Chase  #[id number]  DOB:  20-Oct-1940  08/01/2011:  Joyce Gross returns to the office today to discuss ongoing difficulties with her back in terms of back pain and radicular pain.  She notes that she has significant radicular pain, worse on the left than on the right, on the lateral aspects of the thighs, down to the levels of the knee, but not really pain below the knees.    In reviewing her MRI from February of 2012, she has evidence of advanced spondylitic stenosis at L2-3 and also at L3-L4.  There is bilateral lateral recess stenosis involving both the L2, the L3 and in the lower segment the L4 nerve roots.  She also has advanced degenerative changes at L4-L5.  The stenosis here on the MRI does not appear to be quite as severe.    I indicated to Joyce Gross that prior to considering surgical intervention, we should plan a myelogram and a postmyelogram CAT scan.  This will better identify the pathoanatomy, so I can determine the exact extent of her surgery.  It certainly could be necessary to extend the arthrodesis down to the sacrum and this is a substantial undertaking.  Whether or not we can limit it to just L2-3 and 3-4 would be best determined with the dynamic images we obtain from myelography and a postmyelogram CAT scan.  I would like to do this in the near future and, thereafter, we can plan the surgery.  Joyce Gross tells me that she is doing well from a cardiac standpoint.  She does have an appointment sometime in June to see Dr. Garnette Scheuermann for further evaluation, but I am hopeful that we will be able to evaluate this and get her through the surgery without too much difficulty.    She is using 600 mg of Ibuprofen 3 x a day for pain control, in addition to using Hydrocodone 5/500 twice a day.          Stefani Dama, M.D./sv NEUROSURGICAL CONSULTATION   Ryann Pauli   #[id number] DOB:  02-11-2041   September 09, 2008  Referred by Dr. Geoffry Paradise for evaluation of her lumbar spine.   HISTORY:     Mrs. Maret is  a 71 year old retired female who comes in for evaluation of low back pain.  She has some known arthritis in her back and bone spurs.  She has had six visits for chiropractic care in 2010.  She started having symptoms about five years ago.  She complains of low back pain, left sided, and some intermittent left hip pain.  She primarily has back pain but intermittent left lower extremity pain.  She denies any numbness and tingling.  She has no significant bowel or bladder problems although she does have some stress incontinence.  No significant headaches, seizures, no loss of consciousness.    PAST MEDICAL HISTORY:  Her past medical history is positive for hypertension, gastroesophageal reflux disease, irritable bowel syndrome, and asthma.  FAMILY HISTORY:    Her mother is 53 years old with congestive heart failure.  Father is deceased with history of cerebrovascular accident.    PAST SURGICAL HISTORY:  Positive for cataract surgery by Dr. Hazle Quant in 2000 and she had tubal ligation type surgery in 1971.    DRUG ALLERGIES:   PENICILLIN CAUSES RASH.    SOCIAL HISTORY:    No tobacco use.  She remotely used tobacco in the past.  Positive alcohol use twice weekly.  No history of substance abuse.  5'8". 190 pounds.  REVIEW OF SYSTEMS:   Times 14 is positive for glasses, hypertension, hypercholesterolemia, shortness of breath, arthritis, back pain and incontinence.    CURRENT MEDICATIONS:  Activella, Doxycycline, Benicar, Simvastatin, Lorazepam, Ibuprofen vitamin D and Citrucel.    PHYSICAL EXAMINATION:  Astra Gregg has centralized low back pain at the level of L2-3.  She also has some intermittent left sided hip pain and intermittent left lower extremity pain.  Her strength is 5/5 to all major muscle groups including extensor hallucis longus, anterior tibialis, gastrocs, quadriceps, and iliopsoas.  DTR's are 2+ in patella and Achilles.  Sensation is intact to soft touch at this point in time.  Negative  straight leg raising.  Centralized low back pain L2-3.  Negative Clonus, negative Hoffmann's, and no long tract signs.  DIAGNOSTIC STUDIES:   MRI imaging studies are reviewed and she has advanced spondylitic changes at L2-3, loss of disc height, perhaps some mild adhesion, complete collapse of this level with facet hypertrophy causing severe left foraminal stenosis impinging the left L2 nerve root.  She otherwise has some moderate changes at L3-4, L4-5 and L5-S1.  There is a slight retrolisthesis L5 on S1 with some foraminal stenosis right greater than left.  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    PLAN:      Since Mrs. Lacina is stable at this point in time, she would like to try a home exercise program and she was given a low back book to do some stretches and strengthening exercises.  If she does have persistent problems, we can consider a L2-3 left-sided foraminal injection to try and calm down her symptomatology.  Otherwise, continued conservative care.  To fix this from a surgical standpoint would require at least a one level fusion at L2-3 to decompress the L2 nerve root.  However, she does have moderate changes at other levels as well.  We would like to manage this with conservative care due to her age and hopefully we will be able to keep her at a functional level.    All questions were encouraged, answered and addressed.  The patient was seen today by Hardin Negus, PA-C in the office.  Her pathology was reviewed using the models, her MRI and her physical exam.    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

## 2012-03-25 ENCOUNTER — Encounter: Payer: Self-pay | Admitting: Internal Medicine

## 2012-08-11 ENCOUNTER — Encounter (INDEPENDENT_AMBULATORY_CARE_PROVIDER_SITE_OTHER): Payer: Medicare Other | Admitting: Ophthalmology

## 2012-08-11 DIAGNOSIS — I10 Essential (primary) hypertension: Secondary | ICD-10-CM

## 2012-08-11 DIAGNOSIS — H3530 Unspecified macular degeneration: Secondary | ICD-10-CM

## 2012-08-11 DIAGNOSIS — H35039 Hypertensive retinopathy, unspecified eye: Secondary | ICD-10-CM

## 2012-08-11 DIAGNOSIS — H43819 Vitreous degeneration, unspecified eye: Secondary | ICD-10-CM

## 2012-08-11 DIAGNOSIS — H2700 Aphakia, unspecified eye: Secondary | ICD-10-CM

## 2012-08-19 ENCOUNTER — Ambulatory Visit: Payer: Self-pay | Admitting: Obstetrics & Gynecology

## 2012-10-09 ENCOUNTER — Encounter: Payer: Self-pay | Admitting: Obstetrics & Gynecology

## 2012-10-10 ENCOUNTER — Ambulatory Visit (INDEPENDENT_AMBULATORY_CARE_PROVIDER_SITE_OTHER): Payer: Medicare Other | Admitting: Obstetrics & Gynecology

## 2012-10-10 ENCOUNTER — Encounter: Payer: Self-pay | Admitting: Obstetrics & Gynecology

## 2012-10-10 VITALS — BP 126/64 | HR 60 | Resp 16 | Ht 66.75 in | Wt 188.4 lb

## 2012-10-10 DIAGNOSIS — Z01419 Encounter for gynecological examination (general) (routine) without abnormal findings: Secondary | ICD-10-CM

## 2012-10-10 MED ORDER — VITAMIN D (ERGOCALCIFEROL) 1.25 MG (50000 UNIT) PO CAPS: 50000.0000 [IU] | ORAL_CAPSULE | ORAL | Status: DC

## 2012-10-10 NOTE — Progress Notes (Signed)
72 y.o. [id number] WidowedCaucasianF here for annual exam.  No vaginal bleeding.  Not dancing much.  Going to have back surgery this fall with Dr. Danielle Dess.  She is ready to be done with this.  Dr. Jacky Kindle agrees.  Has been cleared by Dr. Katrinka Blazing except still has stress test to do.    No vaginal bleeding.    Patient's last menstrual period was 02/27/2008.          Sexually active: no  The current method of family planning is post menopausal status.    Exercising: yes  walking Smoker:  no  Health Maintenance: Pap:  07/25/11 WNL History of abnormal Pap:  no MMG:  07/26/11 normal-patient aware is due Colonoscopy:  (Dr. Vonna Kotyk not get scope to pass)  1/09 virtual-negative repeat in 10 years per patient BMD:   2008 Dr Lanell Matar office-normal TDaP:  Up to date  Screening Labs: PCP, Hb today: PCP, Urine today: PCP   reports that she quit smoking about 2 years ago. Her smoking use included Cigarettes. She has a 45 pack-year smoking history. She has never used smokeless tobacco. She reports that  drinks alcohol. She reports that she does not use illicit drugs.  Past Medical History  Diagnosis Date  . Hypertension   . Back pain     Lower Back pain - takes Cortisone shots  . Shortness of breath   . Pneumonia   . Arthritis   . Anxiety   . Rosacea 2008    both eyes  . Bone spur 2009    lower back  . 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 Abdominal hysterectomy    . Eye surgery      Laser Eye Surgery approx 2007  . Tubal ligation    . Tonsillectomy and adenoidectomy    . Hysteroscopy  9/10    D&C (polyp)  . Breast biopsy  8/10    fibrocystic changes  . Heart stent      failed-no stents    Current Outpatient Prescriptions  Medication Sig Dispense Refill  . aspirin 81 MG tablet Take 81 mg by mouth daily.      Marland Kitchen atorvastatin (LIPITOR) 40 MG tablet 40 mg daily.      Marland Kitchen gabapentin (NEURONTIN) 100 MG capsule Take 100 mg by mouth. Pain  300 twice daily      . HYDROcodone-acetaminophen (VICODIN) 5-500 MG per tablet Take 1 tablet by mouth 2 (two) times daily.      Marland Kitchen ibuprofen (ADVIL,MOTRIN) 600 MG tablet Take 600 mg by mouth every 8 (eight) hours as needed. For pain      . lisinopril-hydrochlorothiazide (PRINZIDE,ZESTORETIC) 20-12.5 MG per tablet Take 1 tablet by mouth 2 (two) times daily.      Marland Kitchen LORazepam (ATIVAN) 1 MG tablet Take 1 mg by mouth at bedtime.      . metoprolol (LOPRESSOR) 50 MG tablet Take 50 mg by mouth 2 (two) times daily.      . Multiple Vitamins-Minerals (PRESERVISION/LUTEIN PO) Take by mouth. otc-use twice daily      . nitroGLYCERIN (NITROSTAT) 0.4 MG SL tablet Place 0.4 mg under the tongue every 5 (five) minutes as needed. Chest pain      . Vitamin D, Ergocalciferol, (DRISDOL) 50000 UNITS CAPS Take 50,000 Units by mouth every 14 (fourteen) days.       No current facility-administered medications for this visit.    Family History  Problem Relation Age of Onset  .  Rectal cancer Sister   . Breast cancer Maternal Grandmother   . Congestive Heart Failure Father     ROS:  Pertinent items are noted in HPI.  Otherwise, a comprehensive ROS was negative.  Exam:   BP 126/64  Pulse 60  Resp 16  Ht 5' 6.75" (1.695 m)  Wt 188 lb 6.4 oz (85.458 kg)  BMI 29.74 kg/m2  LMP 02/27/2008  Weight change:+19lbs  Height: 5' 6.75" (169.5 cm)  Ht Readings from Last 3 Encounters:  10/10/12 5' 6.75" (1.695 m)  08/22/11 5' 8.5" (1.74 m)  01/18/11 5\' 5"  (1.651 m)    General appearance: alert, cooperative and appears stated age Head: Normocephalic, without obvious abnormality, atraumatic Neck: no adenopathy, supple, symmetrical, trachea midline and thyroid normal to inspection and palpation Lungs: clear to auscultation bilaterally Breasts: normal appearance, no masses or tenderness Heart: regular rate and rhythm Abdomen: soft, non-tender; bowel sounds normal; no masses,  no organomegaly Extremities: extremities normal,  atraumatic, no cyanosis or edema Skin: Skin color, texture, turgor normal. No rashes or lesions Lymph nodes: Cervical, supraclavicular, and axillary nodes normal. No abnormal inguinal nodes palpated Neurologic: Grossly normal   Pelvic: External genitalia:  no lesions              Urethra:  normal appearing urethra with no masses, tenderness or lesions              Bartholins and Skenes: normal                 Vagina: normal appearing vagina with normal color and discharge, no lesions              Cervix: no lesions              Pap taken: no Bimanual Exam:  Uterus:  normal size, contour, position, consistency, mobility, non-tender              Adnexa: normal adnexa and no mass, fullness, tenderness               Rectovaginal: Confirms               Anus:  normal sphincter tone, no lesions  A:  Well Woman with normal exam PMP, no HRT OAB H/O endometrial polyp  P:   Mammogram yearly.  Pt knows is due pap smear last year On Vit D 50,000 IU every other week. return annually or prn  An After Visit Summary was printed and given to the patient.

## 2012-10-10 NOTE — Patient Instructions (Signed)

## 2012-10-20 ENCOUNTER — Encounter: Payer: Self-pay | Admitting: Internal Medicine

## 2012-10-24 ENCOUNTER — Other Ambulatory Visit: Payer: Self-pay | Admitting: Neurological Surgery

## 2012-12-05 ENCOUNTER — Encounter (HOSPITAL_COMMUNITY): Payer: Self-pay | Admitting: Pharmacy Technician

## 2012-12-08 ENCOUNTER — Encounter (HOSPITAL_COMMUNITY)
Admission: RE | Admit: 2012-12-08 | Discharge: 2012-12-08 | Disposition: A | Discharge status: Home / Self Care | Payer: Medicare Other | Source: Ambulatory Visit | Attending: Neurological Surgery | Admitting: Neurological Surgery

## 2012-12-08 ENCOUNTER — Encounter (HOSPITAL_COMMUNITY): Payer: Self-pay

## 2012-12-08 DIAGNOSIS — Z01818 Encounter for other preprocedural examination: Secondary | ICD-10-CM | POA: Insufficient documentation

## 2012-12-08 DIAGNOSIS — Z01812 Encounter for preprocedural laboratory examination: Secondary | ICD-10-CM | POA: Insufficient documentation

## 2012-12-08 HISTORY — DX: Adverse effect of unspecified anesthetic, initial encounter: T41.45XA

## 2012-12-08 HISTORY — DX: Other complications of anesthesia, initial encounter: T88.59XA

## 2012-12-08 LAB — SURGICAL PCR SCREEN: Staphylococcus aureus: NEGATIVE

## 2012-12-08 LAB — BASIC METABOLIC PANEL
BUN: 24 mg/dL — ABNORMAL HIGH (ref 6–23)
CO2: 30 mEq/L (ref 19–32)
Chloride: 96 mEq/L (ref 96–112)
GFR calc non Af Amer: 56 mL/min — ABNORMAL LOW (ref >90)
Glucose, Bld: 96 mg/dL (ref 70–99)
Potassium: 3.7 mEq/L (ref 3.5–5.1)

## 2012-12-08 LAB — CBC
Hemoglobin: 12.7 g/dL (ref 12.0–15.0)
MCH: 27.9 pg (ref 26.0–34.0)
RBC: 4.56 MIL/uL (ref 3.87–5.11)
WBC: 8.4 10*3/uL (ref 4.0–10.5)

## 2012-12-08 LAB — ABO/RH: ABO/RH(D): A POS

## 2012-12-08 NOTE — Progress Notes (Signed)
Dr Katrinka Blazing called for cardiac clearance,ekg,stress test

## 2012-12-08 NOTE — Pre-Procedure Instructions (Signed)
Rachel Chase  12/08/2012   Your procedure is scheduled on:  12/16/12  Report to [facility]  2 * 3 at 530 AM.  Call this number if you have problems the morning of surgery: [phone]   Remember:   Do not eat food or drink liquids after midnight.   Take these medicines the morning of surgery with A SIP OF WATER: all inhalers,neurontin,hydrocodone,metoprolol   Do not wear jewelry, make-up or nail polish.  Do not wear lotions, powders, or perfumes. You may wear deodorant.  Do not shave 48 hours prior to surgery. Men may shave face and neck.  Do not bring valuables to the hospital.  [facility] is not responsible                  for any belongings or valuables.               Contacts, dentures or bridgework may not be worn into surgery.  Leave suitcase in the car. After surgery it may be brought to your room.  For patients admitted to the hospital, discharge time is determined by your                treatment team.               Patients discharged the day of surgery will not be allowed to drive  home.  Name and phone number of your driver: family  Special Instructions: Shower using CHG 2 nights before surgery and the night before surgery.  If you shower the day of surgery use CHG.  Use special wash - you have one bottle of CHG for all showers.  You should use approximately 1/3 of the bottle for each shower.   Please read over the following fact sheets that you were given: Pain Booklet, Coughing and Deep Breathing, Blood Transfusion Information, MRSA Information and Surgical Site Infection Prevention

## 2012-12-09 NOTE — Progress Notes (Signed)
Anesthesia Chart Review:  Patient is a 72 year old female scheduled for L2-3, L3-4, L4-5 PLIF on 12/16/12 by Dr. Danielle Dess. 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  She reports she has had throat irritation following previous intubations.  PCP is listed as Dr. Geoffry Paradise.  Cardiologist is Dr. Verdis Prime.  He cleared patient for surgery following a mildly abnormal stress test with low risk characteristics (see below).  EKG on 09/30/12 showed NSR, prominent R in V1.  Nuclear stress test on 10/17/12 [facility]) showed evidence of mild ischemia in the basal inferoseptal and basal inferior regions, post-stress EF 85%, no regional wall motion abnormalities.  It was ultimately felt to be a low risk study.  Echo on 01/30/12 [facility]) showed LVEF 60-65%, normal LA size, normal TV structure and function, trivial TR. Trace MR.  Cardiac cath on 01/18/2011 showed: Her left main was short which was patent. LAD has 20-30% proximal and 25-35% mid sequential stenosis. Left circumflex has proximal 30% stenosis. OM-1 and 2 are very small. OM-3 is large which  is patent. 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 Attempts at PCI to RCA were unsuccessful.  CXR on 12/08/12 showed: 1. COPD.  2. No acute abnormality.  3. Stable left lower lobe nodule. The long-term stability is compatible with a benign process.   Preoperative labs noted.  She has been cleared by her cardiologist, so if no acute changes then I would anticipate that she could proceed as planned.  Velna [facility]/Anesthesiology Phone [phone] 12/09/2012 3:12 PM

## 2012-12-15 MED ORDER — VANCOMYCIN HCL IN DEXTROSE 1-5 GM/200ML-% IV SOLN
1000.0000 mg | INTRAVENOUS | Status: AC
  Administered 2012-12-16: 1000 mg via INTRAVENOUS
  Filled 2012-12-15: qty 200

## 2012-12-16 ENCOUNTER — Encounter (HOSPITAL_COMMUNITY)
Admission: RE | Disposition: A | Discharge status: Skilled Nursing Facility | Payer: Medicare Other | Source: Ambulatory Visit | Attending: Neurological Surgery

## 2012-12-16 ENCOUNTER — Inpatient Hospital Stay (HOSPITAL_COMMUNITY): Payer: Medicare Other | Admitting: Anesthesiology

## 2012-12-16 ENCOUNTER — Encounter (HOSPITAL_COMMUNITY): Payer: Self-pay | Admitting: *Deleted

## 2012-12-16 ENCOUNTER — Inpatient Hospital Stay (HOSPITAL_COMMUNITY): Payer: Medicare Other

## 2012-12-16 ENCOUNTER — Inpatient Hospital Stay (HOSPITAL_COMMUNITY)
Admission: RE | Admit: 2012-12-16 | Discharge: 2012-12-20 | DRG: 460 | Disposition: A | Discharge status: Skilled Nursing Facility | Payer: Medicare Other | Source: Ambulatory Visit | Attending: Neurological Surgery | Admitting: Neurological Surgery

## 2012-12-16 ENCOUNTER — Encounter (HOSPITAL_COMMUNITY): Payer: Medicare Other | Admitting: Vascular Surgery

## 2012-12-16 DIAGNOSIS — I10 Essential (primary) hypertension: Secondary | ICD-10-CM | POA: Diagnosis present

## 2012-12-16 DIAGNOSIS — M4180 Other forms of scoliosis, site unspecified: Secondary | ICD-10-CM | POA: Diagnosis present

## 2012-12-16 DIAGNOSIS — M5380 Other specified dorsopathies, site unspecified: Secondary | ICD-10-CM | POA: Diagnosis present

## 2012-12-16 DIAGNOSIS — Z7982 Long term (current) use of aspirin: Secondary | ICD-10-CM

## 2012-12-16 DIAGNOSIS — M48061 Spinal stenosis, lumbar region without neurogenic claudication: Secondary | ICD-10-CM | POA: Diagnosis present

## 2012-12-16 DIAGNOSIS — M48062 Spinal stenosis, lumbar region with neurogenic claudication: Secondary | ICD-10-CM | POA: Diagnosis present

## 2012-12-16 DIAGNOSIS — M4310 Spondylolisthesis, site unspecified: Secondary | ICD-10-CM | POA: Diagnosis present

## 2012-12-16 DIAGNOSIS — Z87891 Personal history of nicotine dependence: Secondary | ICD-10-CM

## 2012-12-16 DIAGNOSIS — M47817 Spondylosis without myelopathy or radiculopathy, lumbosacral region: Principal | ICD-10-CM | POA: Diagnosis present

## 2012-12-16 DIAGNOSIS — D62 Acute posthemorrhagic anemia: Secondary | ICD-10-CM | POA: Diagnosis not present

## 2012-12-16 DIAGNOSIS — I252 Old myocardial infarction: Secondary | ICD-10-CM

## 2012-12-16 SURGERY — POSTERIOR LUMBAR FUSION 3 LEVEL
Anesthesia: General | Site: Back | Wound class: Contaminated

## 2012-12-16 MED ORDER — DEXTROSE 5 % IV SOLN
INTRAVENOUS | Status: DC | PRN
  Administered 2012-12-16: 08:00:00 via INTRAVENOUS

## 2012-12-16 MED ORDER — PHENYLEPHRINE HCL 10 MG/ML IJ SOLN
10.0000 mg | INTRAVENOUS | Status: DC | PRN
  Administered 2012-12-16: 10 ug/min via INTRAVENOUS

## 2012-12-16 MED ORDER — METOPROLOL TARTRATE 50 MG PO TABS
50.0000 mg | ORAL_TABLET | Freq: Two times a day (BID) | ORAL | Status: DC
  Administered 2012-12-16 – 2012-12-20 (×5): 50 mg via ORAL
  Filled 2012-12-16 (×9): qty 1

## 2012-12-16 MED ORDER — LIDOCAINE HCL 4 % MT SOLN
OROMUCOSAL | Status: DC | PRN
  Administered 2012-12-16: 4 mL via TOPICAL

## 2012-12-16 MED ORDER — ONDANSETRON HCL 4 MG/2ML IJ SOLN: 4.0000 mg | Freq: Once | INTRAMUSCULAR | Status: DC | PRN

## 2012-12-16 MED ORDER — PROPOFOL 10 MG/ML IV BOLUS
INTRAVENOUS | Status: DC | PRN
  Administered 2012-12-16: 100 mg via INTRAVENOUS

## 2012-12-16 MED ORDER — MIDAZOLAM HCL 5 MG/5ML IJ SOLN
INTRAMUSCULAR | Status: DC | PRN
  Administered 2012-12-16: 2 mg via INTRAVENOUS

## 2012-12-16 MED ORDER — HYDROCODONE-ACETAMINOPHEN 5-325 MG PO TABS
1.0000 | ORAL_TABLET | Freq: Two times a day (BID) | ORAL | Status: DC
Start: 2012-12-16 — End: 2012-12-20
  Administered 2012-12-16 – 2012-12-20 (×8): 1 via ORAL
  Filled 2012-12-16 (×16): qty 1

## 2012-12-16 MED ORDER — NEOSTIGMINE METHYLSULFATE 1 MG/ML IJ SOLN
INTRAMUSCULAR | Status: DC | PRN
  Administered 2012-12-16: 5 mg via INTRAVENOUS

## 2012-12-16 MED ORDER — PHENYLEPHRINE HCL 10 MG/ML IJ SOLN
INTRAMUSCULAR | Status: DC | PRN
  Administered 2012-12-16: 40 ug via INTRAVENOUS
  Administered 2012-12-16: 80 ug via INTRAVENOUS
  Administered 2012-12-16 (×3): 40 ug via INTRAVENOUS

## 2012-12-16 MED ORDER — DEXAMETHASONE SODIUM PHOSPHATE 10 MG/ML IJ SOLN
INTRAMUSCULAR | Status: DC | PRN
  Administered 2012-12-16: 10 mg via INTRAVENOUS

## 2012-12-16 MED ORDER — SODIUM CHLORIDE 0.9 % IV SOLN
INTRAVENOUS | Status: DC | PRN
  Administered 2012-12-16: 13:00:00 via INTRAVENOUS

## 2012-12-16 MED ORDER — LIDOCAINE HCL (CARDIAC) 20 MG/ML IV SOLN
INTRAVENOUS | Status: DC | PRN
  Administered 2012-12-16: 100 mg via INTRAVENOUS

## 2012-12-16 MED ORDER — SODIUM CHLORIDE 0.9 % IJ SOLN
3.0000 mL | Freq: Two times a day (BID) | INTRAMUSCULAR | Status: DC
  Administered 2012-12-16: 3 mL via INTRAVENOUS

## 2012-12-16 MED ORDER — EPHEDRINE SULFATE 50 MG/ML IJ SOLN
INTRAMUSCULAR | Status: DC | PRN
  Administered 2012-12-16 (×4): 5 mg via INTRAVENOUS
  Administered 2012-12-16: 10 mg via INTRAVENOUS

## 2012-12-16 MED ORDER — SODIUM CHLORIDE 0.9 % IJ SOLN: 3.0000 mL | INTRAMUSCULAR | Status: DC | PRN

## 2012-12-16 MED ORDER — METHOCARBAMOL 100 MG/ML IJ SOLN
500.0000 mg | Freq: Four times a day (QID) | INTRAVENOUS | Status: DC | PRN
  Filled 2012-12-16: qty 5

## 2012-12-16 MED ORDER — ALBUMIN HUMAN 5 % IV SOLN
INTRAVENOUS | Status: DC | PRN
  Administered 2012-12-16 (×2): via INTRAVENOUS

## 2012-12-16 MED ORDER — THROMBIN 5000 UNITS EX SOLR
OROMUCOSAL | Status: DC | PRN
  Administered 2012-12-16: 12:00:00 via TOPICAL

## 2012-12-16 MED ORDER — FENTANYL CITRATE 0.05 MG/ML IJ SOLN
INTRAMUSCULAR | Status: DC | PRN
  Administered 2012-12-16 (×4): 50 ug via INTRAVENOUS
  Administered 2012-12-16: 100 ug via INTRAVENOUS
  Administered 2012-12-16: 50 ug via INTRAVENOUS

## 2012-12-16 MED ORDER — POLYVINYL ALCOHOL 1.4 % OP SOLN
1.0000 [drp] | OPHTHALMIC | Status: DC | PRN
  Filled 2012-12-16: qty 15

## 2012-12-16 MED ORDER — OXYCODONE HCL 5 MG PO TABS: 5.0000 mg | ORAL_TABLET | Freq: Once | ORAL | Status: DC | PRN

## 2012-12-16 MED ORDER — OXYCODONE HCL 5 MG/5ML PO SOLN: 5.0000 mg | Freq: Once | ORAL | Status: DC | PRN

## 2012-12-16 MED ORDER — SODIUM CHLORIDE 0.9 % IV SOLN
INTRAVENOUS | Status: DC
  Administered 2012-12-16: 18:00:00 via INTRAVENOUS

## 2012-12-16 MED ORDER — HYDROMORPHONE HCL PF 1 MG/ML IJ SOLN
0.2500 mg | INTRAMUSCULAR | Status: DC | PRN
  Administered 2012-12-16: 0.5 mg via INTRAVENOUS

## 2012-12-16 MED ORDER — HYDROMORPHONE HCL PF 1 MG/ML IJ SOLN
INTRAMUSCULAR | Status: AC
  Filled 2012-12-16: qty 1

## 2012-12-16 MED ORDER — ALUM & MAG HYDROXIDE-SIMETH 200-200-20 MG/5ML PO SUSP: 30.0000 mL | Freq: Four times a day (QID) | ORAL | Status: DC | PRN

## 2012-12-16 MED ORDER — ACETAMINOPHEN 650 MG RE SUPP: 650.0000 mg | RECTAL | Status: DC | PRN

## 2012-12-16 MED ORDER — DOCUSATE SODIUM 100 MG PO CAPS
100.0000 mg | ORAL_CAPSULE | Freq: Two times a day (BID) | ORAL | Status: DC
  Administered 2012-12-16 – 2012-12-20 (×8): 100 mg via ORAL
  Filled 2012-12-16 (×9): qty 1

## 2012-12-16 MED ORDER — KETOROLAC TROMETHAMINE 15 MG/ML IJ SOLN
INTRAMUSCULAR | Status: DC | PRN
  Administered 2012-12-16: 15 mg via INTRAVENOUS

## 2012-12-16 MED ORDER — MENTHOL 3 MG MT LOZG: 1.0000 | LOZENGE | OROMUCOSAL | Status: DC | PRN

## 2012-12-16 MED ORDER — PHENOL 1.4 % MT LIQD: 1.0000 | OROMUCOSAL | Status: DC | PRN

## 2012-12-16 MED ORDER — SENNA 8.6 MG PO TABS
1.0000 | ORAL_TABLET | Freq: Two times a day (BID) | ORAL | Status: DC
  Administered 2012-12-16 – 2012-12-20 (×8): 8.6 mg via ORAL
  Filled 2012-12-16 (×9): qty 1

## 2012-12-16 MED ORDER — LISINOPRIL 20 MG PO TABS
20.0000 mg | ORAL_TABLET | Freq: Two times a day (BID) | ORAL | Status: DC
  Administered 2012-12-16 – 2012-12-20 (×2): 20 mg via ORAL
  Filled 2012-12-16 (×9): qty 1

## 2012-12-16 MED ORDER — BUPIVACAINE HCL (PF) 0.5 % IJ SOLN
INTRAMUSCULAR | Status: DC | PRN
  Administered 2012-12-16: 5 mL

## 2012-12-16 MED ORDER — GLYCOPYRROLATE 0.2 MG/ML IJ SOLN
INTRAMUSCULAR | Status: DC | PRN
  Administered 2012-12-16: .8 mg via INTRAVENOUS

## 2012-12-16 MED ORDER — SODIUM CHLORIDE 0.9 % IV SOLN: 250.0000 mL | INTRAVENOUS | Status: DC

## 2012-12-16 MED ORDER — ALBUTEROL SULFATE HFA 108 (90 BASE) MCG/ACT IN AERS
INHALATION_SPRAY | RESPIRATORY_TRACT | Status: DC | PRN
  Administered 2012-12-16: 2 via RESPIRATORY_TRACT

## 2012-12-16 MED ORDER — METHOCARBAMOL 500 MG PO TABS
500.0000 mg | ORAL_TABLET | Freq: Four times a day (QID) | ORAL | Status: DC | PRN
  Administered 2012-12-18 – 2012-12-20 (×2): 500 mg via ORAL
  Filled 2012-12-16 (×2): qty 1

## 2012-12-16 MED ORDER — LISINOPRIL-HYDROCHLOROTHIAZIDE 20-12.5 MG PO TABS: 1.0000 | ORAL_TABLET | Freq: Two times a day (BID) | ORAL | Status: DC

## 2012-12-16 MED ORDER — ROCURONIUM BROMIDE 100 MG/10ML IV SOLN
INTRAVENOUS | Status: DC | PRN
  Administered 2012-12-16: 10 mg via INTRAVENOUS
  Administered 2012-12-16: 50 mg via INTRAVENOUS
  Administered 2012-12-16: 10 mg via INTRAVENOUS
  Administered 2012-12-16: 30 mg via INTRAVENOUS
  Administered 2012-12-16: 10 mg via INTRAVENOUS

## 2012-12-16 MED ORDER — LIDOCAINE-EPINEPHRINE 1 %-1:100000 IJ SOLN
INTRAMUSCULAR | Status: DC | PRN
  Administered 2012-12-16: 5 mL via INTRADERMAL

## 2012-12-16 MED ORDER — LORAZEPAM 1 MG PO TABS
1.0000 mg | ORAL_TABLET | Freq: Every day | ORAL | Status: DC
  Administered 2012-12-16 – 2012-12-19 (×4): 1 mg via ORAL
  Filled 2012-12-16 (×4): qty 1

## 2012-12-16 MED ORDER — BISACODYL 10 MG RE SUPP
10.0000 mg | Freq: Every day | RECTAL | Status: DC | PRN
  Administered 2012-12-19: 10 mg via RECTAL
  Filled 2012-12-16: qty 1

## 2012-12-16 MED ORDER — KETOROLAC TROMETHAMINE 15 MG/ML IJ SOLN
15.0000 mg | Freq: Four times a day (QID) | INTRAMUSCULAR | Status: AC
  Administered 2012-12-16 – 2012-12-17 (×4): 15 mg via INTRAVENOUS
  Filled 2012-12-16 (×5): qty 1

## 2012-12-16 MED ORDER — THROMBIN 20000 UNITS EX SOLR
CUTANEOUS | Status: DC | PRN
  Administered 2012-12-16: 10:00:00 via TOPICAL

## 2012-12-16 MED ORDER — HYDROCHLOROTHIAZIDE 12.5 MG PO CAPS
12.5000 mg | ORAL_CAPSULE | Freq: Two times a day (BID) | ORAL | Status: DC
  Administered 2012-12-16 – 2012-12-20 (×3): 12.5 mg via ORAL
  Filled 2012-12-16 (×9): qty 1

## 2012-12-16 MED ORDER — HYDROMORPHONE HCL PF 1 MG/ML IJ SOLN
0.5000 mg | INTRAMUSCULAR | Status: DC | PRN
  Administered 2012-12-16 – 2012-12-17 (×3): 1 mg via INTRAVENOUS
  Filled 2012-12-16 (×3): qty 1

## 2012-12-16 MED ORDER — GABAPENTIN 300 MG PO CAPS
300.0000 mg | ORAL_CAPSULE | Freq: Every evening | ORAL | Status: DC
  Administered 2012-12-16 – 2012-12-19 (×4): 300 mg via ORAL
  Filled 2012-12-16 (×5): qty 1

## 2012-12-16 MED ORDER — ARTIFICIAL TEARS OP OINT
TOPICAL_OINTMENT | OPHTHALMIC | Status: DC | PRN
  Administered 2012-12-16: 1 via OPHTHALMIC

## 2012-12-16 MED ORDER — ACETAMINOPHEN 325 MG PO TABS
650.0000 mg | ORAL_TABLET | ORAL | Status: DC | PRN
  Administered 2012-12-18 – 2012-12-19 (×2): 650 mg via ORAL
  Filled 2012-12-16 (×2): qty 2

## 2012-12-16 MED ORDER — POLYETHYLENE GLYCOL 3350 17 G PO PACK
17.0000 g | PACK | Freq: Every day | ORAL | Status: DC | PRN
  Administered 2012-12-18: 17 g via ORAL
  Filled 2012-12-16: qty 1

## 2012-12-16 MED ORDER — 0.9 % SODIUM CHLORIDE (POUR BTL) OPTIME
TOPICAL | Status: DC | PRN
  Administered 2012-12-16: 1000 mL

## 2012-12-16 MED ORDER — ONDANSETRON HCL 4 MG/2ML IJ SOLN: 4.0000 mg | INTRAMUSCULAR | Status: DC | PRN

## 2012-12-16 MED ORDER — LACTATED RINGERS IV SOLN
INTRAVENOUS | Status: DC | PRN
  Administered 2012-12-16 (×5): via INTRAVENOUS

## 2012-12-16 MED ORDER — ALBUTEROL SULFATE HFA 108 (90 BASE) MCG/ACT IN AERS
2.0000 | INHALATION_SPRAY | Freq: Every day | RESPIRATORY_TRACT | Status: DC | PRN
  Filled 2012-12-16: qty 6.7

## 2012-12-16 MED ORDER — NITROGLYCERIN 0.4 MG SL SUBL: 0.4000 mg | SUBLINGUAL_TABLET | SUBLINGUAL | Status: DC | PRN

## 2012-12-16 MED ORDER — SODIUM CHLORIDE 0.9 % IR SOLN
Status: DC | PRN
  Administered 2012-12-16: 10:00:00

## 2012-12-16 MED ORDER — MEPERIDINE HCL 25 MG/ML IJ SOLN: 6.2500 mg | INTRAMUSCULAR | Status: DC | PRN

## 2012-12-16 MED ORDER — ATORVASTATIN CALCIUM 40 MG PO TABS
40.0000 mg | ORAL_TABLET | Freq: Every day | ORAL | Status: DC
  Administered 2012-12-16 – 2012-12-19 (×3): 40 mg via ORAL
  Filled 2012-12-16 (×6): qty 1

## 2012-12-16 SURGICAL SUPPLY — 67 items
ADH SKN CLS APL DERMABOND .7 (GAUZE/BANDAGES/DRESSINGS) ×1
BAG DECANTER FOR FLEXI CONT (MISCELLANEOUS) ×2 IMPLANT
BLADE SURG ROTATE 9660 (MISCELLANEOUS) IMPLANT
BUR MATCHSTICK NEURO 3.0 LAGG (BURR) ×2 IMPLANT
CAGE 13MM (Cage) ×2 IMPLANT
CANISTER SUCTION 2500CC (MISCELLANEOUS) ×2 IMPLANT
CONT SPEC 4OZ CLIKSEAL STRL BL (MISCELLANEOUS) ×4 IMPLANT
COVER BACK TABLE 24X17X13 BIG (DRAPES) IMPLANT
COVER TABLE BACK 60X90 (DRAPES) ×2 IMPLANT
CROSSLINK MEDIUM (Cage) ×1 IMPLANT
DECANTER SPIKE VIAL GLASS SM (MISCELLANEOUS) ×2 IMPLANT
DERMABOND ADVANCED (GAUZE/BANDAGES/DRESSINGS) ×1
DERMABOND ADVANCED .7 DNX12 (GAUZE/BANDAGES/DRESSINGS) ×1 IMPLANT
DRAPE C-ARM 42X72 X-RAY (DRAPES) ×4 IMPLANT
DRAPE LAPAROTOMY 100X72X124 (DRAPES) ×2 IMPLANT
DRAPE POUCH INSTRU U-SHP 10X18 (DRAPES) ×2 IMPLANT
DRAPE PROXIMA HALF (DRAPES) IMPLANT
DURAPREP 26ML APPLICATOR (WOUND CARE) ×2 IMPLANT
ELECT REM PT RETURN 9FT ADLT (ELECTROSURGICAL) ×2
ELECTRODE REM PT RTRN 9FT ADLT (ELECTROSURGICAL) ×1 IMPLANT
GAUZE SPONGE 4X4 16PLY XRAY LF (GAUZE/BANDAGES/DRESSINGS) IMPLANT
GLOVE BIOGEL PI IND STRL 7.0 (GLOVE) IMPLANT
GLOVE BIOGEL PI IND STRL 7.5 (GLOVE) IMPLANT
GLOVE BIOGEL PI IND STRL 8.5 (GLOVE) ×2 IMPLANT
GLOVE BIOGEL PI INDICATOR 7.0 (GLOVE) ×2
GLOVE BIOGEL PI INDICATOR 7.5 (GLOVE) ×2
GLOVE BIOGEL PI INDICATOR 8.5 (GLOVE) ×2
GLOVE ECLIPSE 7.0 STRL STRAW (GLOVE) ×1 IMPLANT
GLOVE ECLIPSE 8.5 STRL (GLOVE) ×4 IMPLANT
GLOVE EXAM NITRILE LRG STRL (GLOVE) IMPLANT
GLOVE EXAM NITRILE MD LF STRL (GLOVE) IMPLANT
GLOVE EXAM NITRILE XL STR (GLOVE) IMPLANT
GLOVE EXAM NITRILE XS STR PU (GLOVE) IMPLANT
GLOVE SURG SS PI 7.0 STRL IVOR (GLOVE) ×2 IMPLANT
GOWN BRE IMP SLV AUR LG STRL (GOWN DISPOSABLE) ×1 IMPLANT
GOWN BRE IMP SLV AUR XL STRL (GOWN DISPOSABLE) IMPLANT
GOWN STRL REIN 2XL LVL4 (GOWN DISPOSABLE) ×5 IMPLANT
HEMOSTAT POWDER KIT SURGIFOAM (HEMOSTASIS) ×1 IMPLANT
KIT BASIN OR (CUSTOM PROCEDURE TRAY) ×2 IMPLANT
KIT ROOM TURNOVER OR (KITS) ×2 IMPLANT
MILL MEDIUM DISP (BLADE) ×2 IMPLANT
NEEDLE HYPO 22GX1.5 SAFETY (NEEDLE) ×2 IMPLANT
NS IRRIG 1000ML POUR BTL (IV SOLUTION) ×2 IMPLANT
PACK FOAM VITOSS 10CC (Orthopedic Implant) ×1 IMPLANT
PACK LAMINECTOMY NEURO (CUSTOM PROCEDURE TRAY) ×2 IMPLANT
PAD ARMBOARD 7.5X6 YLW CONV (MISCELLANEOUS) ×6 IMPLANT
PATTIES SURGICAL .5 X1 (DISPOSABLE) ×2 IMPLANT
PEEK PLIF NOVEL 9X25X12 (Peek) ×1 IMPLANT
PEEK PLIF NOVEL 9X25X8MM (Peek) ×2 IMPLANT
ROD 100MM (Rod) ×2 IMPLANT
SCREW POLYAX 6.5X45MM (Screw) ×6 IMPLANT
SCREW SET SPINAL STD HEXALOBE (Screw) ×6 IMPLANT
SPONGE GAUZE 4X4 12PLY (GAUZE/BANDAGES/DRESSINGS) ×2 IMPLANT
SPONGE LAP 4X18 X RAY DECT (DISPOSABLE) IMPLANT
SPONGE SURGIFOAM ABS GEL 100 (HEMOSTASIS) ×2 IMPLANT
SUT VIC AB 1 CT1 18XBRD ANBCTR (SUTURE) ×1 IMPLANT
SUT VIC AB 1 CT1 8-18 (SUTURE) ×4
SUT VIC AB 2-0 CP2 18 (SUTURE) ×3 IMPLANT
SUT VIC AB 3-0 SH 8-18 (SUTURE) ×3 IMPLANT
SYR 20ML ECCENTRIC (SYRINGE) ×2 IMPLANT
SYR 3ML LL SCALE MARK (SYRINGE) ×9 IMPLANT
TAPE CLOTH SURG 4X10 WHT LF (GAUZE/BANDAGES/DRESSINGS) ×1 IMPLANT
TOWEL OR 17X24 6PK STRL BLUE (TOWEL DISPOSABLE) ×2 IMPLANT
TOWEL OR 17X26 10 PK STRL BLUE (TOWEL DISPOSABLE) ×2 IMPLANT
TRAP SPECIMEN MUCOUS 40CC (MISCELLANEOUS) ×2 IMPLANT
TRAY FOLEY CATH 14FRSI W/METER (CATHETERS) ×2 IMPLANT
WATER STERILE IRR 1000ML POUR (IV SOLUTION) ×2 IMPLANT

## 2012-12-16 NOTE — H&P (Signed)
Rachel Chase is an 72 y.o. female.   Chief Complaint: Back and bilateral leg pain HPI: A varus Maisie Fus is a 72 year old individual is had significant problems with back and bilateral lower extremity pain for a number of years she's been treated conservatively and has done reasonably well however more recently she notes that her ability to have any stamina on her feet is becoming severely limited. 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 She has degenerative changes at L5-S1 but no evidence of nerve root cut off. Having failed efforts at conservative management the patient was advised regarding the need for surgical decompression and likely stabilization. She is now admitted to undergo this procedure.  Past Medical History  Diagnosis Date  . Hypertension   . Back pain     Lower Back pain - takes Cortisone shots  . Shortness of breath   . Pneumonia   . Arthritis   . Anxiety   . Rosacea 2008    both eyes  . Bone spur 2009    lower back  . Complication of anesthesia     throat irrition from past intubations  . MI (myocardial infarction) 11/12    Garnette Scheuermann    Past Surgical History  Procedure Laterality Date  . Eye surgery      Laser Eye Surgery approx 2007  . Tubal ligation    . Tonsillectomy and adenoidectomy    . Hysteroscopy  9/10    D&C (polyp)  . Breast biopsy  8/10    fibrocystic changes  . Heart stent      failed-no stents    Family History  Problem Relation Age of Onset  . Rectal cancer Sister   . Breast cancer Maternal Grandmother 90  . Congestive Heart Failure Father   . Breast cancer Maternal Aunt    Social History:  reports that she quit smoking about 2 years ago. Her smoking use included Cigarettes. She has a 45 pack-year smoking history. She has never used smokeless tobacco. She reports that she drinks alcohol. She reports that she does not use illicit drugs.  Allergies:   Allergies  Allergen Reactions  . Dilaudid [Hydromorphone Hcl] Nausea And Vomiting  . Penicillins Rash    Medications Prior to Admission  Medication Sig Dispense Refill  . albuterol (PROVENTIL HFA;VENTOLIN HFA) 108 (90 BASE) MCG/ACT inhaler Inhale 2 puffs into the lungs daily as needed for wheezing or shortness of breath.      . Artificial Tear Solution (SOOTHE HYDRATION OP) Place 1 drop into both eyes daily as needed (dry eyes).      Marland Kitchen aspirin EC 81 MG tablet Take 81 mg by mouth daily with supper.      Marland Kitchen atorvastatin (LIPITOR) 40 MG tablet Take 40 mg by mouth at bedtime.       . gabapentin (NEURONTIN) 300 MG capsule Take 300 mg by mouth every evening.      Marland Kitchen HYDROcodone-acetaminophen (NORCO/VICODIN) 5-325 MG per tablet Take 1 tablet by mouth 2 (two) times daily.      Marland Kitchen ibuprofen (ADVIL,MOTRIN) 600 MG tablet Take 600 mg by mouth 2 (two) times daily.       Marland Kitchen lisinopril-hydrochlorothiazide (PRINZIDE,ZESTORETIC) 20-12.5 MG per tablet Take 1 tablet by mouth 2 (two) times daily.      Marland Kitchen LORazepam (ATIVAN) 1 MG tablet Take 1 mg by mouth at bedtime.      . metoprolol (LOPRESSOR) 50 MG  tablet Take 50 mg by mouth 2 (two) times daily.      . Multiple Vitamins-Minerals (PRESERVISION/LUTEIN PO) Take 1 tablet by mouth 2 (two) times daily.       . nitroGLYCERIN (NITROSTAT) 0.4 MG SL tablet Place 0.4 mg under the tongue every 5 (five) minutes as needed for chest pain.       . Vitamin D, Ergocalciferol, (DRISDOL) 50000 UNITS CAPS capsule Take 50,000 Units by mouth every 30 (thirty) days.         No results found for this or any previous visit (from the past 48 hour(s)). No results found.  Review of Systems  HENT: Negative.   Eyes: Negative.   Respiratory: Negative.   Cardiovascular: Negative.   Gastrointestinal: Negative.   Musculoskeletal: Positive for back pain.  Skin: Negative.   Neurological: Positive for tingling, focal weakness and weakness.  Endo/Heme/Allergies: Negative.    Psychiatric/Behavioral: Negative.     Blood pressure 129/54, pulse 70, temperature 97.1 F (36.2 C), temperature source Oral, resp. rate 18, last menstrual period 02/27/2008, SpO2 99.00%. Physical Exam  Constitutional: She is oriented to person, place, and time. She appears well-developed and well-nourished.  HENT:  Head: Normocephalic and atraumatic.  Eyes: Conjunctivae and EOM are normal. Pupils are equal, round, and reactive to light.  Neck: Normal range of motion. Neck supple.  Cardiovascular: Normal rate and regular rhythm.   Respiratory: Effort normal and breath sounds normal.  GI: Soft. Bowel sounds are normal.  Musculoskeletal:  Centralized back pain. Modest paraspinous muscle tenderness. Stands with 5 forward stoop.  Neurological: She is alert and oriented to person, place, and time.  Absent patellar and Achilles reflexes bilaterally. ME reflexes 1+ in biceps and triceps and brachioradialis sensation diminished to vibratory sensation at both ankles. Pin and light touch sensation intact. Motor strength reveals modest weakness in tibialis anterior 4-5 bilaterally. Gastroc strength is normal. No atrophy is noted. Cranial nerve examination is within the limits of normal. Cerebellar testing also normal.  Skin: Skin is warm and dry.  Psychiatric: She has a normal mood and affect. Her behavior is normal. Judgment and thought content normal.     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  Admit for decompression L2-L5 with posterior interbody arthrodesis and pedicle screw fixation L2-L5.  Karyss Frese J 12/16/2012, 8:29 AM

## 2012-12-16 NOTE — Transfer of Care (Signed)
Immediate Anesthesia Transfer of Care Note  Patient: Rachel Chase  Procedure(s) Performed: Procedure(s) with comments: Lumbar Two-Three Lumbar Three-Four Lumbar Four-Five Posterior lumbar interbody fusion/Peek cages/Pedicle screws (N/A) - Lumbar Two-Three Lumbar Three-Four Lumbar Four-Five Posterior lumbar interbody fusion/Peek cages/Pedicle screws  Patient Location: PACU  Anesthesia Type:General  Level of Consciousness: awake and patient cooperative  Airway & Oxygen Therapy: Patient Spontanous Breathing and Patient connected to face mask oxygen  Post-op Assessment: Report given to PACU RN, Post -op Vital signs reviewed and stable and Patient moving all extremities X 4  Post vital signs: Reviewed and stable  Complications: No apparent anesthesia complications

## 2012-12-16 NOTE — Progress Notes (Signed)
Patient ID: Rachel Chase, female   DOB: 02/26/1941, 72 y.o.   MRN: [id number] Vital signs are stable. Feels good with lower extremities and back. Tolerating medication reasonably well. Stable.

## 2012-12-16 NOTE — Op Note (Signed)
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, degenerative scoliosis, lumbar radiculopathy Procedure: Lumbar laminectomy L2 L3-L4, decompression of L2 L3-L4 and L5 nerve roots bilaterally with more work than require for simple interbody arthrodesis, posterior lumbar interbody arthrodesis L2-3 L3-4 L4-5 using peek spacers local autograft and allograft, segmental fixation L2-L5 with pedicle screws posterior lateral arthrodesis with local autograft and allograft L2-L5 Surgeon: Barnett Abu First assistant:Neelesh Conchita Paris, MD Anesthesia: General endotracheal Indications: Caver sinuses 72 year old individual is had significant problems with back pain bilateral lower extremity pain and lumbar radiculopathy this is been treated extensively over the years with intermittent epidural steroid injections pain medications various therapeutic exercise and physical therapy she's become refractive to therapy over the past years time and surgery was discussed with the leg because of her health concerns at this time she would like to undergo surgical decompression and stabilization in an effort to improve her stamina on her feet.  Procedure: The patient was brought to the operating room supine on a stretcher. After the smooth induction of general endotracheal anesthesia, she was turned prone. The back was prepped with alcohol and DuraPrep and draped in a sterile fashion. A midline incision was created in the lower lumbar spine and this was carried down to the number dorsal fascia. The fascia was opened on either side of the midline to expose the first 2 spinous processes which were noted be that of L2 and L3 area these were marked positively with sutures. The dissection was then carried out in the sub-periosteal space to expose L2 L3-L4 and L5 out to and including  the facet at L2-3 L3-4 L4-5. The transverse processes were then exposed also and these were decorticated and packed away for later used as grafting sites. A self-retaining cerebellar retractor in addition to a McCullough retractor was placed in the wound. Then laminectomy was performed removing the inferior margins of the lamina out to and including the entirety of of the facet at L2 leaving a portion of the laminar arch and spinous process for the interspinous attachment of L1-L2. At L3 the entire laminar arch including the entire facet complex at L3-4 was removed similarly at L4 the entire laminar arch and spinous process and facet complex was removed dissection was then undertaken to decompress the lateral recesses of the common dural tube in each case we encountered the subsequent nerve roots and they were individually decompressed of significant hypertrophy of the ligament thickening of the facet of bone causing severe stenosis particularly worse at the level of L4-5 and L3-L4 for the exiting L3-L4 and L5 nerve roots. Once these were individually decompressed on both sides we expose region of the disc space. At L2-3 the L2 nerve roots were decompressed a similar stenosis tremor only from ligamentous overgrowth. There is a large dorsal osteophyte from the disc space also contribute to foraminal stenosis for particularly the left L2 nerve root. This was decompressed with a high-speed drill a combination of curettes and rongeurs and small Kerrison punches. Once the disc spaces were then isolated a discectomy was performed starting at L3-L4 here there is a marked amount of severely degenerated and desiccated disc material that was removed from the disc space the endplates were curettaged until the bony endplates were completely removed and decorticated. Interbody she space was then sized for appropriate size peek spacer. A 13 mm peek spacer was felt to fit best in the interspace.  12 mm box cutter was used to cut  the endplates to allow better placement of the peek spacers. Autograft in the form of the laminar arches and facet complex is that had been prepared by scraping away all the fascial tissue and then cutting the bone the small fragments was packed into the interspace prior to placement of the peek spacers.  At L4-L5 12 mm peek spacer was placed on the left side where there was a significant collapse and formation of a scoliosis. This left for decompression of the curve on the concave side. The path of the L5 nerve root in the L4 nerve root was carefully protected and further decompression by removal of osteophytic overgrowth was also performed for the L4 nerve root. At L4-5 to 12 mm peek spacer was placed into the interspace on the left side in the right side was packed with autograft for a total of 9 cc of autograft in the interspace. L2-3 was then addressed and here the disc space was noted to be severely desiccated and compressed at this level a series of shavers starting at 4 mm in diameter were used to open the disc space area ultimately only an 8 mm peek spacer could be placed to allow some distraction of the interspace. Nonetheless by removing significant osteophytes the L2 nerve roots were well decompressed superiorly. Once all the interbody spacers were placed at L2-3 3445 pedicle entry sites were chosen at each of the vertebrae at L2-3 6.5 x 45 mm screws were placed into the L2 pedicles left side at L3-1 6.5 x 45 mm screw was placed on the right side at L4 on the 6.5 x 45 mm screw was placed in L5 bilateral 6.5 x 45 mm screws were placed the screws individually sounded tapped and then placed under fluoroscopic guidance. The lateral gutters which had been previously decorticated and packed away were then packed with the autograft and allograft. 100 mm precontoured rods were then placed between the screw heads in the construct was tightened and a slight bit of extension to reproduce more normal lordosis. In  the end the paths of the L2 the L3 the L4 and L5 nerve roots were checked and found to be well decompressed. Hemostasis in the epidural spaces was obtained meticulously with bipolar cautery and also some small pledgets of Gelfoam soaked in thrombin. The wound was here gated copiously and hemostasis and the soft tissues was also checked. The lumbar dorsal fascia was then closed with #1 Vicryl after placing a transverse connector between the rods. 2-0 Vicryl was used in the subcutaneous tissues 20 cc of half percent Marcaine was injected into the paraspinous tissues and fascia 3-0 Vicryl is used to close the subcutaneous foreskin. Dermabond was placed on the skin along with a dry gauze bandage. Blood loss for the procedure was estimated at 1200 cc and 400 cc of Cell Saver blood was returned to the patient. She was returned to recovery room in stable condition.left side at L3-1 6.5 x 45 mm screw was placed on the right side at L4

## 2012-12-16 NOTE — Progress Notes (Signed)
Orthopedic Tech Progress Note Patient Details:  Rachel Chase 1941-01-13 [id number] Brace was a pre-fit order. Patient ID: Rachel Chase, female   DOB: 07-17-1940, 72 y.o.   MRN: [id number]   [name] 12/16/2012, 4:50 PM

## 2012-12-16 NOTE — Preoperative (Signed)
Beta Blockers   Reason not to administer Beta Blockers:Lopressor this am 

## 2012-12-16 NOTE — Progress Notes (Signed)
Orthopedic Tech Progress Note Patient Details:  Rachel Chase April 02, 1940 [id number] Called [employer] for brace order at time stamped. Patient ID: Rachel Chase, female   DOB: [birth year], 72 y.o.   MRN: [id number]   Rachel Chase 12/16/2012, 4:47 PM

## 2012-12-16 NOTE — Anesthesia Preprocedure Evaluation (Addendum)
Anesthesia Evaluation  Patient identified by MRN, date of birth, ID band Patient awake    Reviewed: Allergy & Precautions, H&P , NPO status , Patient's Chart, lab work & pertinent test results  Airway Mallampati: I TM Distance: >3 FB Neck ROM: Full    Dental   Pulmonary          Cardiovascular hypertension, Pt. on medications + Past MI  H/O Out of hospital arrest, MI and whole body cooling in 12/2010. Followed by Garnette Scheuermann MD. Cleared for surgery. Non ischemic stress test and ECHO with EF 55+%   Neuro/Psych    GI/Hepatic   Endo/Other    Renal/GU      Musculoskeletal   Abdominal   Peds  Hematology   Anesthesia Other Findings   Reproductive/Obstetrics                          Anesthesia Physical Anesthesia Plan  ASA: III  Anesthesia Plan: General   Post-op Pain Management:    Induction: Intravenous  Airway Management Planned: Oral ETT  Additional Equipment:   Intra-op Plan:   Post-operative Plan: Extubation in OR  Informed Consent: I have reviewed the patients History and Physical, chart, labs and discussed the procedure including the risks, benefits and alternatives for the proposed anesthesia with the patient or authorized representative who has indicated his/her understanding and acceptance.     Plan Discussed with: CRNA and Surgeon  Anesthesia Plan Comments:         Anesthesia Quick Evaluation

## 2012-12-16 NOTE — Anesthesia Procedure Notes (Signed)
Procedure Name: Intubation Date/Time: 12/16/2012 8:47 AM Performed by: Sherie Don Pre-anesthesia Checklist: Patient identified, Emergency Drugs available, Suction available, Patient being monitored and Timeout performed Patient Re-evaluated:Patient Re-evaluated prior to inductionOxygen Delivery Method: Circle system utilized Preoxygenation: Pre-oxygenation with 100% oxygen Intubation Type: IV induction Ventilation: Mask ventilation without difficulty Laryngoscope Size: Mac and 3 Grade View: Grade III Tube type: Oral Tube size: 7.5 mm Number of attempts: 1 Airway Equipment and Method: Stylet and LTA kit utilized Placement Confirmation: ETT inserted through vocal cords under direct vision,  positive ETCO2 and breath sounds checked- equal and bilateral Secured at: 22 cm Tube secured with: Tape Dental Injury: Teeth and Oropharynx as per pre-operative assessment

## 2012-12-17 LAB — CBC
HCT: 23.7 % — ABNORMAL LOW (ref 36.0–46.0)
MCH: 28.4 pg (ref 26.0–34.0)
MCHC: 33.3 g/dL (ref 30.0–36.0)
MCV: 85.3 fL (ref 78.0–100.0)
Platelets: 168 10*3/uL (ref 150–400)
RBC: 2.78 MIL/uL — ABNORMAL LOW (ref 3.87–5.11)

## 2012-12-17 LAB — POCT I-STAT 4, (NA,K, GLUC, HGB,HCT)
HCT: 30 % — ABNORMAL LOW (ref 36.0–46.0)
Hemoglobin: 10.2 g/dL — ABNORMAL LOW (ref 12.0–15.0)
Sodium: 138 mEq/L (ref 135–145)

## 2012-12-17 LAB — BASIC METABOLIC PANEL
CO2: 25 mEq/L (ref 19–32)
Calcium: 7.5 mg/dL — ABNORMAL LOW (ref 8.4–10.5)
Chloride: 103 mEq/L (ref 96–112)
Creatinine, Ser: 0.84 mg/dL (ref 0.50–1.10)
GFR calc non Af Amer: 68 mL/min — ABNORMAL LOW (ref >90)
Glucose, Bld: 126 mg/dL — ABNORMAL HIGH (ref 70–99)
Sodium: 137 mEq/L (ref 135–145)

## 2012-12-17 MED ORDER — SODIUM CHLORIDE 0.9 % IV SOLN: INTRAVENOUS | Status: DC | PRN

## 2012-12-17 MED ORDER — OXYCODONE-ACETAMINOPHEN 5-325 MG PO TABS
1.0000 | ORAL_TABLET | ORAL | Status: DC | PRN
  Administered 2012-12-18 – 2012-12-20 (×8): 1 via ORAL
  Filled 2012-12-17 (×8): qty 1

## 2012-12-17 MED FILL — Sodium Chloride IV Soln 0.9%: INTRAVENOUS | Qty: 3000 | Status: AC

## 2012-12-17 MED FILL — Heparin Sodium (Porcine) Inj 1000 Unit/ML: INTRAMUSCULAR | Qty: 30 | Status: AC

## 2012-12-17 NOTE — Anesthesia Postprocedure Evaluation (Signed)
Anesthesia Post Note  Patient: Rachel Chase  Procedure(s) Performed: Procedure(s) (LRB): Lumbar Two-Three Lumbar Three-Four Lumbar Four-Five Posterior lumbar interbody fusion/Peek cages/Pedicle screws (N/A)  Anesthesia type: general  Patient location: PACU  Post pain: Pain level controlled  Post assessment: Patient's Cardiovascular Status Stable  Last Vitals:  Filed Vitals:   12/17/12 0945  BP: 73/37  Pulse:   Temp:   Resp: 14    Post vital signs: Reviewed and stable  Level of consciousness: sedated  Complications: No apparent anesthesia complications

## 2012-12-17 NOTE — Progress Notes (Signed)
Subjective: Patient reports Offers no significant complaints feels fairly well. Has ambulated today knee and it.  Objective: Vital signs in last 24 hours: Temp:  [97.6 F (36.4 C)-98.2 F (36.8 C)] 98.2 F (36.8 C) (10/22 1130) Pulse Rate:  [76-107] 104 (10/22 1900) Resp:  [6-23] 16 (10/22 1900) BP: (70-114)/(29-81) 98/79 mmHg (10/22 1900) SpO2:  [92 %-100 %] 96 % (10/22 1900)  Intake/Output from previous day: 10/21 0701 - 10/22 0700 In: 7018.3 [P.O.:120; I.V.:5988.3; Blood:410; IV Piggyback:500] Out: 1905 [Urine:705; Blood:1200] Intake/Output this shift:    Incision is clean and dry, motor function is intact in lower extremities.  Lab Results:  Recent Labs  12/16/12 1345 12/17/12 0352  WBC  --  9.4  HGB 10.2* 7.9*  HCT 30.0* 23.7*  PLT  --  168   BMET  Recent Labs  12/16/12 1345 12/17/12 0352  NA 138 137  K 3.4* 3.8  CL  --  103  CO2  --  25  GLUCOSE 196* 126*  BUN  --  23  CREATININE  --  0.84  CALCIUM  --  7.5*    Studies/Results: Dg Lumbar Spine 2-3 Views  12/16/2012   *RADIOLOGY REPORT*  Clinical Data: L2 - L5 PLIF  DG C-ARM 1-60 MIN,LUMBAR SPINE - 2-3 VIEW  Comparison:  Intraoperative localization for radiographs of earlier same day.  Fluoroscopy time:  1 minute, 6 seconds  Findings:  Two spot fluoroscopic images of the lower lumbar spine provided for review.  Lumbar spine labeling is in keeping with intraoperative lumbar spine localization radiographs performed earlier same day.  Post L2 - L5 paraspinal fusion with bilateral pedicular screws seen at L2 and L5, a solitary left-sided pedicular screw at L3 and a solitary right-sided pedicular screw at L4.  Post intervertebral disc space replacement of L2 - L3, L3 - L4 and L4 - L5 with restoration of these intervertebral disc space heights.  No radiopaque foreign body.  IMPRESSION: Post L2 - L5 PLIF and intervertebral disc space replacement.   Original Report Authenticated By: Tacey Ruiz, MD   Dg Lumbar  Spine 1 View  12/16/2012   CLINICAL DATA:  Lumbar localization, intraoperative, for posterior lumbar fixation.  EXAM: LUMBAR SPINE - 1 VIEW  COMPARISON:  08/22/2011  FINDINGS: Tissue spreaders are in place with the upper tissue spreader projecting over the L2 spinous process and the lower tissue spreader projecting over the L3 spinous process.  Reduced intervertebral disc height noted at L2-3, and L5-S1. There is 3 mm degenerative retrolisthesis at L2-3 and L3-4.  IMPRESSION: 1. Tissue spreaders in place at L2-3.   Electronically Signed   By: Herbie Baltimore M.D.   On: 12/16/2012 14:43   Dg C-arm 1-60 Min  12/16/2012   *RADIOLOGY REPORT*  Clinical Data: L2 - L5 PLIF  DG C-ARM 1-60 MIN,LUMBAR SPINE - 2-3 VIEW  Comparison:  Intraoperative localization for radiographs of earlier same day.  Fluoroscopy time:  1 minute, 6 seconds  Findings:  Two spot fluoroscopic images of the lower lumbar spine provided for review.  Lumbar spine labeling is in keeping with intraoperative lumbar spine localization radiographs performed earlier same day.  Post L2 - L5 paraspinal fusion with bilateral pedicular screws seen at L2 and L5, a solitary left-sided pedicular screw at L3 and a solitary right-sided pedicular screw at L4.  Post intervertebral disc space replacement of L2 - L3, L3 - L4 and L4 - L5 with restoration of these intervertebral disc space heights.  No radiopaque  foreign body.  IMPRESSION: Post L2 - L5 PLIF and intervertebral disc space replacement.   Original Report Authenticated By: Tacey Ruiz, MD    Assessment/Plan: Stable postop  LOS: 1 day  Transferred to the floor. Recheck CBC in a.m. as hemoglobin 7.9 today.   Rachel Chase 12/17/2012, 7:53 PM

## 2012-12-17 NOTE — Progress Notes (Signed)
Report called to [facility] RN on unit 4north .Patient to be transferred to 4N room 8

## 2012-12-17 NOTE — Evaluation (Signed)
Physical Therapy Evaluation Patient Details Name: Rachel Chase MRN: [id number] DOB: May 27, 1940 Today's Date: 12/17/2012 Time: 0811-0830 PT Time Calculation (min): 19 min  PT Assessment / Plan / Recommendation History of Present Illness  pt presents with L2-5 PLIF.    Clinical Impression  Pt moving great and will continue to make great progress.      PT Assessment  Patient needs continued PT services    Follow Up Recommendations  SNF    Does the patient have the potential to tolerate intense rehabilitation      Barriers to Discharge        Equipment Recommendations  None recommended by PT    Recommendations for Other Services     Frequency Min 5X/week    Precautions / Restrictions Precautions Precautions: Back Precaution Booklet Issued: Yes (comment) Required Braces or Orthoses: Spinal Brace Spinal Brace: Lumbar corset;Applied in sitting position Restrictions Weight Bearing Restrictions: No   Pertinent Vitals/Pain "A little sore."        Mobility  Bed Mobility Bed Mobility: Not assessed Details for Bed Mobility Assistance: pt standing at sink with RN.   Transfers Transfers: Stand to Sit Stand to Sit: 4: Min guard;With upper extremity assist;To chair/3-in-1 Details for Transfer Assistance: cues forUE use and getting closer prior to sitting.   Ambulation/Gait Ambulation/Gait Assistance: 4: Min guard Ambulation Distance (Feet): 150 Feet Assistive device: None Ambulation/Gait Assistance Details: pt with occasional use of hallway rail for balance.  pt with occaisional side steps, but no LOB.   Gait Pattern: Step-through pattern;Decreased stride length Stairs: No Wheelchair Mobility Wheelchair Mobility: No    Exercises     PT Diagnosis: Difficulty walking  PT Problem List: Decreased activity tolerance;Decreased balance;Decreased mobility;Decreased knowledge of use of DME;Decreased knowledge of precautions PT Treatment Interventions: DME instruction;Gait  training;Functional mobility training;Therapeutic activities;Therapeutic exercise;Balance training;Neuromuscular re-education;Patient/family education     PT Goals(Current goals can be found in the care plan section) Acute Rehab PT Goals Patient Stated Goal: Play with grandchildren PT Goal Formulation: With patient Time For Goal Achievement: 12/31/12 Potential to Achieve Goals: Good  Visit Information  Last PT Received On: 12/17/12 Assistance Needed: +1 History of Present Illness: pt presents with L2-5 PLIF.         Prior Functioning  Home Living Family/patient expects to be discharged to:: Skilled nursing facility Additional Comments: pt lives at [facility] and will return to their SNF at D/C.   Prior Function Level of Independence: Independent Comments: pt had A with heavy cleaning only.   Communication Communication: No difficulties    Cognition  Cognition Arousal/Alertness: Awake/alert Behavior During Therapy: WFL for tasks assessed/performed Overall Cognitive Status: Within Functional Limits for tasks assessed    Extremity/Trunk Assessment Upper Extremity Assessment Upper Extremity Assessment: Defer to OT evaluation Lower Extremity Assessment Lower Extremity Assessment: Overall WFL for tasks assessed   Balance Balance Balance Assessed: No  End of Session PT - End of Session Equipment Utilized During Treatment: Gait belt;Back brace Activity Tolerance: Patient tolerated treatment well Patient left: in chair;with call bell/phone within reach Nurse Communication: Mobility status  GP     [name], [place] [phone] 12/17/2012, 12:37 PM

## 2012-12-17 NOTE — Progress Notes (Signed)
Utilization review completed.  

## 2012-12-17 NOTE — Clinical Social Work Note (Signed)
Clinical Social Worker received referral for possible ST-SNF placement.  Chart reviewed and spoke with RN.  PT/OT evaluations remain pending at this time.  Per RN, patient has already been out of bed to chair with small ambulation.  Patient should be able to return home at discharge.  Spoke with RN Case Manager who will follow up with patient to discuss home health needs as needed.    CSW signing off - please re consult if social work needs arise.  Rachel Chase, [place] [phone]

## 2012-12-18 LAB — CBC
HCT: 20.9 % — ABNORMAL LOW (ref 36.0–46.0)
Hemoglobin: 6.9 g/dL — CL (ref 12.0–15.0)
MCHC: 33 g/dL (ref 30.0–36.0)
RBC: 2.45 MIL/uL — ABNORMAL LOW (ref 3.87–5.11)
WBC: 7.3 10*3/uL (ref 4.0–10.5)

## 2012-12-18 NOTE — Progress Notes (Signed)
Subjective: Patient reports Offers no complaints. Has been anemic with hemoglobin 6.9.  Objective: Vital signs in last 24 hours: Temp:  [97.7 F (36.5 C)-100 F (37.8 C)] 98.9 F (37.2 C) (10/23 2115) Pulse Rate:  [103-114] 114 (10/23 2115) Resp:  [16-20] 16 (10/23 2115) BP: (108-132)/(40-67) 127/67 mmHg (10/23 2115) SpO2:  [95 %-100 %] 100 % (10/23 1739)  Intake/Output from previous day: 10/22 0701 - 10/23 0700 In: 240 [P.O.:240] Out: 550 [Urine:550] Intake/Output this shift: Total I/O In: 279 [Blood:279] Out: -   Incision clean and dry motor function appears intact the  Lab Results:  Recent Labs  12/17/12 0352 12/18/12 0438  WBC 9.4 7.3  HGB 7.9* 6.9*  HCT 23.7* 20.9*  PLT 168 155   BMET  Recent Labs  12/16/12 1345 12/17/12 0352  NA 138 137  K 3.4* 3.8  CL  --  103  CO2  --  25  GLUCOSE 196* 126*  BUN  --  23  CREATININE  --  0.84  CALCIUM  --  7.5*    Studies/Results: No results found.  Assessment/Plan: Stable postop hemoglobin 6.9 secondary to acute blood loss anemia. Only transfusion of 2 units of packed red cells.  LOS: 2 days  Transfuse 2 units packed red cells.   Kylan Liberati J 12/18/2012, 10:02 PM

## 2012-12-18 NOTE — Progress Notes (Signed)
Occupational Therapy Evaluation Patient Details Name: Rachel Chase MRN: [id number] DOB: 20-Feb-1941 Today's Date: 12/18/2012 Time: 1231-1300 OT Time Calculation (min): 29 min  OT Assessment / Plan / Recommendation History of present illness pt presents with L2-5 PLIF.     Clinical Impression   PTA, pt lived at Independent Living at [place]. Educated pt on available AE to assist with ADL. Pt making excellent progress, but will benefit from further rehab at SNF to facilitate safe D/C back to Independent Living.     OT Assessment  All further OT needs can be met in the next venue of care    Follow Up Recommendations  SNF    Barriers to Discharge      Equipment Recommendations  None recommended by OT    Recommendations for Other Services    Frequency       Precautions / Restrictions Precautions Precautions: Back Precaution Booklet Issued: Yes (comment) Required Braces or Orthoses: Spinal Brace Spinal Brace: Lumbar corset;Applied in sitting position   Pertinent Vitals/Pain no apparent distress Low Hgb but non symptomatic     ADL  Upper Body Bathing: Supervision/safety;Set up Where Assessed - Upper Body Bathing: Unsupported sitting Lower Body Bathing: Minimal assistance Where Assessed - Lower Body Bathing: Unsupported sit to stand Upper Body Dressing: Supervision/safety;Set up Where Assessed - Upper Body Dressing: Unsupported sitting Lower Body Dressing: Minimal assistance Where Assessed - Lower Body Dressing: Unsupported sit to stand Toilet Transfer: Min guard Toilet Transfer Method: Other (comment) (ambulating) Acupuncturist: Comfort height toilet Toileting - Clothing Manipulation and Hygiene: Minimal assistance Where Assessed - Engineer, mining and Hygiene: Sit to stand from 3-in-1 or toilet Equipment Used: Back brace;Long-handled shoe horn;Long-handled sponge;Reacher;Sock aid Transfers/Ambulation Related to ADLs: S ADL Comments:  Educated pto n back precautions/ADL retraining/use of AE/DME.    OT Diagnosis: Generalized weakness;Acute pain  OT Problem List: Decreased activity tolerance;Decreased knowledge of use of DME or AE;Decreased knowledge of precautions;Pain OT Treatment Interventions:     OT Goals(Current goals can be found in the care plan section)    Visit Information  Last OT Received On: 12/18/12 Assistance Needed: +1 History of Present Illness: pt presents with L2-5 PLIF.         Prior Functioning     Home Living Family/patient expects to be discharged to:: Skilled nursing facility Additional Comments: pt lives at [facility] and will return to their SNF at D/C.   Prior Function Level of Independence: Independent Comments: pt had A with heavy cleaning only.   Communication Communication: No difficulties Dominant Hand: Right         Vision/Perception     Cognition  Cognition Arousal/Alertness: Awake/alert Behavior During Therapy: WFL for tasks assessed/performed Overall Cognitive Status: Within Functional Limits for tasks assessed    Extremity/Trunk Assessment Upper Extremity Assessment Upper Extremity Assessment: Overall WFL for tasks assessed Lower Extremity Assessment Lower Extremity Assessment: Overall WFL for tasks assessed Cervical / Trunk Assessment Cervical / Trunk Assessment: Normal     Mobility Bed Mobility Bed Mobility: Rolling Right;Right Sidelying to Sit Rolling Right: 6: Modified independent (Device/Increase time) Right Sidelying to Sit: 6: Modified independent (Device/Increase time) Transfers Transfers: Sit to Stand;Stand to Sit Sit to Stand: 5: Supervision Stand to Sit: 5: Supervision     Exercise     Balance Balance Balance Assessed:  (WFL for ADL)   End of Session OT - End of Session Activity Tolerance: Patient tolerated treatment well Patient left: in chair;with call bell/phone within reach Nurse  Communication: Mobility status   GO     Gearline Spilman,[place] 12/18/2012, 1:36 PM [facility], OTR/L  [phone] 12/18/2012

## 2012-12-18 NOTE — Progress Notes (Signed)
Clinical Social Work Department BRIEF PSYCHOSOCIAL ASSESSMENT 12/18/2012  Patient:  RONNY, RUDDELL     Account Number:  [id number]     Admit date:  12/16/2012  Clinical Social Worker:  Robin Searing  Date/Time:  12/18/2012 01:57 PM  Referred by:  Physician  Date Referred:  12/18/2012 Referred for  SNF Placement   Other Referral:   Interview type:  Patient Other interview type:    PSYCHOSOCIAL DATA Living Status:  ALONE Admitted from facility:  [facility] Level of care:   Primary support name:  friends and family Primary support relationship to patient:  FAMILY Degree of support available:   good but minimal    CURRENT CONCERNS Current Concerns  Post-Acute Placement   Other Concerns:    SOCIAL WORK ASSESSMENT / PLAN Patient admitted from [facility] [place] [facility]- patient plans to return to the SNF unit there for rehab- I have confirmed this with the SNF rep at [facility] and will await MD for d/c date-   Assessment/plan status:  Other - See comment Other assessment/ plan:   Information/referral to community resources:   EMS    PATIENT'S/FAMILY'S RESPONSE TO PLAN OF CARE: Patient working with therapy upon my visit- sitting up and appears to be in good spirits and pain controlled-  CSW will follow to assist with SNF plans at d/c.       Reece Levy, MSW, [place] Majors [phone]

## 2012-12-18 NOTE — Progress Notes (Signed)
Physical Therapy Treatment Patient Details Name: Rachel Chase MRN: [id number] DOB: 08-04-1940 Today's Date: 12/18/2012 Time: 8657-8469 PT Time Calculation (min): 14 min  PT Assessment / Plan / Recommendation  History of Present Illness pt presents with L2-5 PLIF.     PT Comments   Patient progressing very well. Awaiting SNF at [facility] [place]. Patient instructed to call for assistance when getting up as she has been known to get up without calling out. Discussed risk of falling at this time  Follow Up Recommendations  SNF     Does the patient have the potential to tolerate intense rehabilitation     Barriers to Discharge        Equipment Recommendations  None recommended by PT    Recommendations for Other Services    Frequency Min 5X/week   Progress towards PT Goals Progress towards PT goals: Progressing toward goals  Plan Current plan remains appropriate    Precautions / Restrictions Precautions Precautions: Back Precaution Booklet Issued: Yes (comment) Precaution Comments: Patient able to recall 2/3 precautions. Cues for no bending Required Braces or Orthoses: Spinal Brace Spinal Brace: Lumbar corset;Applied in sitting position   Pertinent Vitals/Pain no apparent distress     Mobility  Bed Mobility Bed Mobility: Rolling Right;Right Sidelying to Sit Rolling Right: 6: Modified independent (Device/Increase time) Right Sidelying to Sit: 6: Modified independent (Device/Increase time) Transfers Sit to Stand: 5: Supervision Stand to Sit: 5: Supervision Ambulation/Gait Ambulation/Gait Assistance: 4: Min guard Ambulation Distance (Feet): 300 Feet Assistive device: None Ambulation/Gait Assistance Details: occassional use of hallway railing and some lateral sway but no LOB Gait Pattern: Step-through pattern;Decreased stride length    Exercises     PT Diagnosis:    PT Problem List:   PT Treatment Interventions:     PT Goals (current goals can now be found in the  care plan section)    Visit Information  Last PT Received On: 12/18/12 Assistance Needed: +1 History of Present Illness: pt presents with L2-5 PLIF.      Subjective Data      Cognition  Cognition Arousal/Alertness: Awake/alert Behavior During Therapy: WFL for tasks assessed/performed Overall Cognitive Status: Within Functional Limits for tasks assessed    Balance  Balance Balance Assessed:  (WFL for ADL)  End of Session PT - End of Session Equipment Utilized During Treatment: Gait belt;Back brace Activity Tolerance: Patient tolerated treatment well Patient left: in chair;with call bell/phone within reach Nurse Communication: Mobility status   GP     Fredrich Birks 12/18/2012, 2:03 PM 12/18/2012 Fredrich Birks PTA [phone] pager [phone] office

## 2012-12-18 NOTE — Progress Notes (Addendum)
Report received from [facility] on 3300. Pt brought to floor in wheelchair. Safely ambulated to bed. Pt oriented to room, given call bell. Vitals stable.

## 2012-12-18 NOTE — Progress Notes (Signed)
CRITICAL VALUE ALERT  Critical value received:  Hemoglobin 6.9  Date of notification:  12/18/12  Time of notification:  0535  Critical value read back:yes  Nurse who received alert:  Morrie Sheldon MD notified (1st page):  Lovell Sheehan  Time of first page:  0555  MD notified (2nd page):  Time of second page:  Responding MD:  Lovell Sheehan  Time MD responded:  0600

## 2012-12-19 DIAGNOSIS — M48061 Spinal stenosis, lumbar region without neurogenic claudication: Secondary | ICD-10-CM | POA: Diagnosis present

## 2012-12-19 LAB — CBC
HCT: 29.7 % — ABNORMAL LOW (ref 36.0–46.0)
Hemoglobin: 10.2 g/dL — ABNORMAL LOW (ref 12.0–15.0)
MCV: 84.6 fL (ref 78.0–100.0)
RBC: 3.51 MIL/uL — ABNORMAL LOW (ref 3.87–5.11)
WBC: 9.4 10*3/uL (ref 4.0–10.5)

## 2012-12-19 MED ORDER — METHOCARBAMOL 500 MG PO TABS: 500.0000 mg | ORAL_TABLET | Freq: Four times a day (QID) | ORAL | Status: DC | PRN

## 2012-12-19 MED ORDER — OXYCODONE-ACETAMINOPHEN 5-325 MG PO TABS: 1.0000 | ORAL_TABLET | ORAL | Status: DC | PRN

## 2012-12-19 MED ORDER — FLEET ENEMA 7-19 GM/118ML RE ENEM
1.0000 | ENEMA | Freq: Once | RECTAL | Status: DC
  Filled 2012-12-19: qty 1

## 2012-12-19 NOTE — Discharge Summary (Signed)
Physician Discharge Summary  Patient ID: Rachel Chase MRN: [id number] DOB/AGE: [birth year] 72 y.o.  Admit date: 12/16/2012 Discharge date: 12/19/2012  Admission Diagnoses: Lumbar stenosis, lumbar spondylolisthesis, neurogenic claudication  Discharge Diagnoses: Lumbar stenosis, lumbar spondylolisthesis, neurogenic claudication, acute blood loss anemia Active Problems:   * No active hospital problems. *   Discharged Condition: good  Hospital Course: Patient was admitted to undergo surgical decompression from L2-L5. She tolerated surgery well. Incisions remain clean and dry. He did require 2 units of blood transfusion postoperatively as her hemoglobin decreased to 6.9. This was secondary to acute blood loss anemia.  Consults: None  Significant Diagnostic Studies: None  Treatments: surgery: Lumbar laminectomy L2 L3-L4 decompression of L2 L3-L4 and L5 nerve roots posterior interbody arthrodesis L2-L5. Posterior lateral arthrodesis L2-L5.  Discharge Exam: Blood pressure 127/57, pulse 79, temperature 98.1 F (36.7 C), temperature source Oral, resp. rate 18, height 5\' 8"  (1.727 m), weight 83.915 kg (185 lb), last menstrual period 02/27/2008, SpO2 100.00%. incision is clean and dry. Motor function is intact in both lower extremities.  Disposition: [facility] skilled nursing facility.  Discharge Orders   Future Orders Complete By Expires   Call MD for:  redness, tenderness, or signs of infection (pain, swelling, redness, odor or green/yellow discharge around incision site)  As directed    Call MD for:  severe uncontrolled pain  As directed    Call MD for:  temperature >100.4  As directed    Diet - low sodium heart healthy  As directed    Discharge instructions  As directed    Comments:     Okay to shower. Do not apply salves or appointments to incision. No heavy lifting with the upper extremities greater than 15 pounds. May resume driving when not requiring pain medication and patient  feels comfortable with doing so.   Increase activity slowly  As directed        Medication List         albuterol 108 (90 BASE) MCG/ACT inhaler  Commonly known as:  PROVENTIL HFA;VENTOLIN HFA  Inhale 2 puffs into the lungs daily as needed for wheezing or shortness of breath.     aspirin EC 81 MG tablet  Take 81 mg by mouth daily with supper.     atorvastatin 40 MG tablet  Commonly known as:  LIPITOR  Take 40 mg by mouth at bedtime.     gabapentin 300 MG capsule  Commonly known as:  NEURONTIN  Take 300 mg by mouth every evening.     HYDROcodone-acetaminophen 5-325 MG per tablet  Commonly known as:  NORCO/VICODIN  Take 1 tablet by mouth 2 (two) times daily.     ibuprofen 600 MG tablet  Commonly known as:  ADVIL,MOTRIN  Take 600 mg by mouth 2 (two) times daily.     lisinopril-hydrochlorothiazide 20-12.5 MG per tablet  Commonly known as:  PRINZIDE,ZESTORETIC  Take 1 tablet by mouth 2 (two) times daily.     LORazepam 1 MG tablet  Commonly known as:  ATIVAN  Take 1 mg by mouth at bedtime.     methocarbamol 500 MG tablet  Commonly known as:  ROBAXIN  Take 1 tablet (500 mg total) by mouth every 6 (six) hours as needed.     metoprolol 50 MG tablet  Commonly known as:  LOPRESSOR  Take 50 mg by mouth 2 (two) times daily.     nitroGLYCERIN 0.4 MG SL tablet  Commonly known as:  NITROSTAT  Place 0.4 mg under  the tongue every 5 (five) minutes as needed for chest pain.     oxyCODONE-acetaminophen 5-325 MG per tablet  Commonly known as:  PERCOCET/ROXICET  Take 1-2 tablets by mouth every 4 (four) hours as needed for pain.     PRESERVISION/LUTEIN PO  Take 1 tablet by mouth 2 (two) times daily.     SOOTHE HYDRATION OP  Place 1 drop into both eyes daily as needed (dry eyes).     Vitamin D (Ergocalciferol) 50000 UNITS Caps capsule  Commonly known as:  DRISDOL  Take 50,000 Units by mouth every 30 (thirty) days.         SignedStefani Dama 12/19/2012, 4:15 PM

## 2012-12-19 NOTE — Clinical Social Work Note (Signed)
CSW will assist with dc needs.  Pt will be dc to [facility] upon dc.  Please contact CSW once pt is medically ready to dc.  MD - please also sign FL2 (on chart).  Vickii Penna, LCSWA [phone]  Clinical Social Work

## 2012-12-19 NOTE — Progress Notes (Signed)
Physical Therapy Treatment Patient Details Name: EMALIE MCWETHY MRN: [id number] DOB: 04/12/1940 Today's Date: 12/19/2012 Time: 4098-1191 PT Time Calculation (min): 24 min  PT Assessment / Plan / Recommendation  History of Present Illness pt presents with L2-5 PLIF.     PT Comments   Patient continues to make great progress. Received two unit of PRBCs and stated she has more energy and feels better overall. Anticipate DC soon.   Follow Up Recommendations  SNF     Does the patient have the potential to tolerate intense rehabilitation     Barriers to Discharge        Equipment Recommendations  None recommended by PT    Recommendations for Other Services    Frequency Min 5X/week   Progress towards PT Goals Progress towards PT goals: Progressing toward goals  Plan Current plan remains appropriate    Precautions / Restrictions Precautions Precautions: Back Precaution Comments: Patient able to verbalize and maintain 3/3 precautions Required Braces or Orthoses: Spinal Brace Spinal Brace: Lumbar corset;Applied in sitting position   Pertinent Vitals/Pain 2/10 back pain. patient repositioned for comfort     Mobility  Bed Mobility Rolling Right: 6: Modified independent (Device/Increase time) Right Sidelying to Sit: 6: Modified independent (Device/Increase time) Transfers Sit to Stand: 6: Modified independent (Device/Increase time) Stand to Sit: 6: Modified independent (Device/Increase time) Ambulation/Gait Ambulation/Gait Assistance: 5: Supervision Ambulation Distance (Feet): 300 Feet Assistive device: None Ambulation/Gait Assistance Details: Patient with increased balance. Not reaching for hallway rails this session Gait Pattern: Step-through pattern    Exercises     PT Diagnosis:    PT Problem List:   PT Treatment Interventions:     PT Goals (current goals can now be found in the care plan section)    Visit Information  Last PT Received On: 12/19/12 Assistance  Needed: +1 History of Present Illness: pt presents with L2-5 PLIF.      Subjective Data      Cognition  Cognition Arousal/Alertness: Awake/alert Behavior During Therapy: WFL for tasks assessed/performed Overall Cognitive Status: Within Functional Limits for tasks assessed    Balance     End of Session PT - End of Session Equipment Utilized During Treatment: Back brace Activity Tolerance: Patient tolerated treatment well Patient left: in chair;with call bell/phone within reach;with family/visitor present Nurse Communication: Mobility status   GP     Fredrich Birks 12/19/2012, 8:39 AM 12/19/2012 Fredrich Birks PTA [phone] pager [phone] office

## 2012-12-19 NOTE — Progress Notes (Signed)
Pt stable no reaction post transfusion pt's H/H 10.2/29.7 .Pain under control, no acute distress noted .RN will continue to monitor.

## 2012-12-20 LAB — TYPE AND SCREEN
ABO/RH(D): A POS
Unit division: 0

## 2012-12-20 NOTE — Progress Notes (Signed)
Patient's sister arrived to transport patient to [facility].  Nurse tech to wheel her downstairs for dc.  Mariam Dollar

## 2012-12-20 NOTE — Progress Notes (Signed)
Clinical Child psychotherapist (CSW) received call from Press photographer regarding patient D/C to [facility] today. CSW faxed D/C summary to [facility] and prepared patient's D/C packet. Patient reported that her sister Maralyn Sago would transport patient to [facility]. Please reconsult if further social work needs arise. CSW signing off.   Jetta Lout, LCSWA Weekend CSW [phone]

## 2012-12-20 NOTE — Progress Notes (Signed)
Attempted to call report 3 times to Tracie,RN at [facility].  I got no answer and the main desk attempted to transfer me 3 times.  Mariam Dollar

## 2012-12-20 NOTE — Progress Notes (Signed)
Patient ready for discharge to [facility].  Prescriptions for pain medication given to patient and we are waiting for social work to clear her to go.  Her sister will be driving her.  Will continue to monitor until discharge.  Lance Bosch, RN

## 2012-12-20 NOTE — Progress Notes (Signed)
Patient ID: Rachel Chase, female   DOB: Feb 08, 1941, 72 y.o.   MRN: [id number] Subjective:  the patient is alert and pleasant. She looks and feels well. She wants to go to the skilled nursing facility.  Objective: Vital signs in last 24 hours: Temp:  [97.9 F (36.6 C)-99 F (37.2 C)] 97.9 F (36.6 C) (10/25 0525) Pulse Rate:  [77-101] 77 (10/25 0525) Resp:  [18-20] 18 (10/25 0525) BP: (103-147)/(57-78) 130/69 mmHg (10/25 0525) SpO2:  [97 %-100 %] 98 % (10/25 0525)  Intake/Output from previous day: 10/24 0701 - 10/25 0700 In: -  Out: 355 [Urine:350; Emesis/NG output:3; Stool:2] Intake/Output this shift:    Physical exam the patient is alert and pleasant. Her strength is grossly normal in her lower extremities. Her wound is healing well without signs of infection.  Lab Results:  Recent Labs  12/18/12 0438 12/19/12 1415  WBC 7.3 9.4  HGB 6.9* 10.2*  HCT 20.9* 29.7*  PLT 155 175   BMET No results found for this basename: NA, K, CL, CO2, GLUCOSE, BUN, CREATININE, CALCIUM,  in the last 72 hours  Studies/Results: No results found.  Assessment/Plan: Postop day #4: The patient is doing well. Arrangements have been made for her to be transferred to a skilled nursing facility today.  LOS: 4 days     Chameka Mcmullen D 12/20/2012, 9:12 AM

## 2013-02-26 DIAGNOSIS — C50919 Malignant neoplasm of unspecified site of unspecified female breast: Secondary | ICD-10-CM

## 2013-02-26 HISTORY — PX: BREAST LUMPECTOMY: SHX2

## 2013-02-26 HISTORY — DX: Malignant neoplasm of unspecified site of unspecified female breast: C50.919

## 2013-09-28 ENCOUNTER — Ambulatory Visit
Admission: RE | Admit: 2013-09-28 | Discharge: 2013-09-28 | Disposition: A | Discharge status: Home / Self Care | Payer: Medicare Other | Source: Ambulatory Visit

## 2013-09-28 ENCOUNTER — Other Ambulatory Visit: Payer: Self-pay

## 2013-09-28 ENCOUNTER — Encounter (INDEPENDENT_AMBULATORY_CARE_PROVIDER_SITE_OTHER): Payer: Self-pay

## 2013-09-28 DIAGNOSIS — Z1231 Encounter for screening mammogram for malignant neoplasm of breast: Secondary | ICD-10-CM

## 2013-10-01 ENCOUNTER — Other Ambulatory Visit: Payer: Self-pay | Admitting: Obstetrics & Gynecology

## 2013-10-01 DIAGNOSIS — R928 Other abnormal and inconclusive findings on diagnostic imaging of breast: Secondary | ICD-10-CM

## 2013-10-07 ENCOUNTER — Other Ambulatory Visit: Payer: Self-pay

## 2013-10-07 ENCOUNTER — Other Ambulatory Visit: Payer: Self-pay | Admitting: Obstetrics & Gynecology

## 2013-10-07 DIAGNOSIS — R928 Other abnormal and inconclusive findings on diagnostic imaging of breast: Secondary | ICD-10-CM

## 2013-10-12 ENCOUNTER — Other Ambulatory Visit: Payer: Self-pay | Admitting: Obstetrics & Gynecology

## 2013-10-12 DIAGNOSIS — R928 Other abnormal and inconclusive findings on diagnostic imaging of breast: Secondary | ICD-10-CM

## 2013-10-13 ENCOUNTER — Ambulatory Visit
Admission: RE | Admit: 2013-10-13 | Discharge: 2013-10-13 | Disposition: A | Discharge status: Home / Self Care | Payer: Medicare Other | Source: Ambulatory Visit | Attending: Obstetrics & Gynecology | Admitting: Obstetrics & Gynecology

## 2013-10-13 ENCOUNTER — Other Ambulatory Visit: Payer: Self-pay | Admitting: Obstetrics & Gynecology

## 2013-10-13 DIAGNOSIS — R928 Other abnormal and inconclusive findings on diagnostic imaging of breast: Secondary | ICD-10-CM

## 2013-10-21 ENCOUNTER — Ambulatory Visit (INDEPENDENT_AMBULATORY_CARE_PROVIDER_SITE_OTHER): Payer: Medicare Other | Admitting: General Surgery

## 2013-10-21 ENCOUNTER — Encounter (INDEPENDENT_AMBULATORY_CARE_PROVIDER_SITE_OTHER): Payer: Self-pay | Admitting: General Surgery

## 2013-10-21 ENCOUNTER — Other Ambulatory Visit (INDEPENDENT_AMBULATORY_CARE_PROVIDER_SITE_OTHER): Payer: Self-pay

## 2013-10-21 ENCOUNTER — Encounter (INDEPENDENT_AMBULATORY_CARE_PROVIDER_SITE_OTHER): Payer: Self-pay

## 2013-10-21 VITALS — BP 126/80 | HR 82 | Temp 97.2°F | Ht 68.0 in | Wt 187.0 lb

## 2013-10-21 DIAGNOSIS — D0592 Unspecified type of carcinoma in situ of left breast: Secondary | ICD-10-CM

## 2013-10-21 DIAGNOSIS — C50312 Malignant neoplasm of lower-inner quadrant of left female breast: Secondary | ICD-10-CM

## 2013-10-21 DIAGNOSIS — C50319 Malignant neoplasm of lower-inner quadrant of unspecified female breast: Secondary | ICD-10-CM

## 2013-10-21 NOTE — Progress Notes (Signed)
Chief Complaint: New diagnosis of breast cancer  History:    Rachel Chase is a 73 y.o. postmenopausal female referred by Dr. 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 She recently presented for a screening mamogram revealing A possible mass in the lower inner breast.  Subsequent imaging included diagnostic mamogram showing A suspicious mass and ultrasound showing A 1.3 x 0.8 x 0.7 cm irregular hypoechoic mass in the 7:30 position of the left breast 7 cm from the nipple.   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 She is seen now in The office for initial treatment planning.  She has experienced No breast symptoms, specifically lump or pain or nipple discharge or skin changes.  She does have a personal history of A benign core biopsy of the left breast with findings of a benign cyst.  Findings at that time were the following:  Tumor size: 1.3 cm  Tumor grade: 2  Estrogen Receptor: positive Progesterone Receptor: positive  Her-2 neu: negative  Lymph node status: negative   Past Medical History  Diagnosis Date  . Hypertension   . Back pain     Lower Back pain - takes Cortisone shots  . Shortness of breath   . Pneumonia   . Arthritis   . Anxiety   . Rosacea 2008    both eyes  . Bone spur 2009    lower back  . Complication of anesthesia     throat irrition from past intubations  . 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 Eye surgery      Laser Eye Surgery approx 2007  . Tubal ligation    . Tonsillectomy and adenoidectomy    . Hysteroscopy  9/10    D&C (polyp)  . Breast biopsy  8/10    fibrocystic changes  . Heart stent      failed-no stents    Current Outpatient Prescriptions  Medication Sig Dispense Refill  . albuterol (PROVENTIL HFA;VENTOLIN HFA) 108 (90 BASE) MCG/ACT inhaler Inhale 2  puffs into the lungs daily as needed for wheezing or shortness of breath.      . Artificial Tear Solution (SOOTHE HYDRATION OP) Place 1 drop into both eyes daily as needed (dry eyes).      Marland Kitchen aspirin EC 81 MG tablet Take 81 mg by mouth daily with supper.      Marland Kitchen atorvastatin (LIPITOR) 40 MG tablet Take 40 mg by mouth at bedtime.       . gabapentin (NEURONTIN) 300 MG capsule Take 300 mg by mouth every evening.      Marland Kitchen HYDROcodone-acetaminophen (NORCO/VICODIN) 5-325 MG per tablet Take 1 tablet by mouth 2 (two) times daily.      Marland Kitchen ibuprofen (ADVIL,MOTRIN) 600 MG tablet Take 600 mg by mouth 2 (two) times daily.       Marland Kitchen lisinopril-hydrochlorothiazide (PRINZIDE,ZESTORETIC) 20-12.5 MG per tablet Take 1 tablet by mouth 2 (two) times daily.      Marland Kitchen LORazepam (ATIVAN) 1 MG tablet Take 1 mg by mouth at bedtime.      . methocarbamol (ROBAXIN) 500 MG tablet Take 1 tablet (500 mg total) by mouth every 6 (six) hours as needed.  60 tablet  3  . metoprolol (LOPRESSOR) 50 MG tablet Take 50 mg by mouth 2 (two) times daily.      . Multiple  Vitamins-Minerals (PRESERVISION/LUTEIN PO) Take 1 tablet by mouth 2 (two) times daily.       . nitroGLYCERIN (NITROSTAT) 0.4 MG SL tablet Place 0.4 mg under the tongue every 5 (five) minutes as needed for chest pain.       Marland Kitchen oxyCODONE-acetaminophen (PERCOCET/ROXICET) 5-325 MG per tablet Take 1-2 tablets by mouth every 4 (four) hours as needed for pain.  60 tablet  0  . Vitamin D, Ergocalciferol, (DRISDOL) 50000 UNITS CAPS capsule Take 50,000 Units by mouth every 30 (thirty) days.        No current facility-administered medications for this visit.    Family History  Problem Relation Age of Onset  . Rectal cancer Sister   . Breast cancer Sister   . Breast cancer Maternal Grandmother 90  . Congestive Heart Failure Father   . Breast cancer Maternal Aunt     History   Social History  . Marital Status: Widowed    Spouse Name: N/A    Number of Children: N/A  . Years of  Education: N/A   Social History Main Topics  . Smoking status: Former Smoker -- 1.00 packs/day for 45 years    Types: Cigarettes    Quit date: 04/17/2010  . Smokeless tobacco: Never Used  . Alcohol Use: 0.0 - 0.5 oz/week    0-1 drink(s) per week     Comment: occ  . Drug Use: No  . Sexual Activity: No   Other Topics Concern  . Not on file   Social History Narrative  . No narrative on file     Review of Systems Constitutional: negative Respiratory: positive for dyspnea on exertion Cardiovascular: negative Gastrointestinal: negative, colonoscopy is up-to-date Genitourinary:negative Neurological: negative     Objective:  LMP 02/27/2008 BP 126/80  Pulse 82  Temp(Src) 97.2 F (36.2 C)  Ht $R'5\' 8"'tL$  (1.727 m)  Wt 187 lb (84.823 kg)  BMI 28.44 kg/m2  LMP 02/27/2008  General: Alert, well-developed Caucasian female, in no distress Skin: Warm and dry without rash or infection. HEENT: No palpable masses or thyromegaly. Sclera nonicteric. Pupils equal round and reactive. Oropharynx clear. Breasts: Some bruising and tenderness in the inferior left breast without distinct mass. No other abnormalities palpable in either breast. No palpable axillary adenopathy. Lymph nodes: No cervical, supraclavicular, or inguinal nodes palpable. Lungs: Breath sounds clear and equal without increased work of breathing Cardiovascular: Regular rate and rhythm without murmur. No JVD or edema. Peripheral pulses intact. Abdomen: Nondistended. Soft and nontender. No masses palpable. No organomegaly. No palpable hernias. Extremities: No edema or joint swelling or deformity. No chronic venous stasis changes. Neurologic: Alert and fully oriented. Gait normal.   Laboratory data:  CBC:  Lab Results  Component Value Date   WBC 9.4 12/19/2012   RBC 3.51* 12/19/2012   HGB 10.2* 12/19/2012   HCT 29.7* 12/19/2012   PLT 175 12/19/2012  ]  [facility] Labs:  Lab Results  Component Value Date   NA 137 12/17/2012    K 3.8 12/17/2012   CL 103 12/17/2012   CO2 25 12/17/2012   BUN 23 12/17/2012   CREATININE 0.84 12/17/2012   CALCIUM 7.5* 12/17/2012   PROT 6.4 01/18/2011   BILITOT 0.3 01/18/2011   ALKPHOS 67 01/18/2011   AST 142* 01/18/2011   ALT 156* 01/18/2011     Assessment  73 y.o. female with a new diagnosis of cancer of the the left breast lower inner quadrant.  Clinical IA, estrogen receptor positive, progesterone receptor positive, Her2/Neu protein/oncogene negative  and Ki 67 - growth factor low. I discussed with the patient and family members present today initial surgical treatment options. We discussed options of breast conservation with lumpectomy or total mastectomy and sentinal lymph node biopsy/dissection. Options for reconstruction were discussed. After discussion they have elected to proceed with left partial mastectomy With axillary sentinel lymph node biopsy. We discussed that with her family history I suspect genetic testing would be indicated. She would like to pursue this but she wants breast conservation regardless and states that she will discuss genetic testing with her medical oncologist before proceeding..  We discussed the indications and nature of the procedure, and expected recovery, in detail. Surgical risks including anesthetic complications, cardiorespiratory complications, bleeding, infection, wound healing complications, blood clots, lymphedema, local and distant recurrence and possible need for further surgery based on the final pathology was discussed and understood.  Chemotherapy, hormonal therapy and radiation therapy have been discussed. They have been provided with literature regarding the treatment of breast cancer.  All questions were answered. They understand and agree to proceed and we will go ahead with scheduling.  Plan Needle localized left partial mastectomy Left axillary sentinel lymph node biopsy Under general anesthesia as an outpatient. We will obtain  preoperative cardiac clearance from Dr. Tommie Raymond MD, FACS  10/21/2013, 2:35 PM

## 2013-10-21 NOTE — Patient Instructions (Signed)
Plan we discussed today is to proceed with needle localized left breast lumpectomy with left axillary sentinel lymph node biopsy as an outpatient. My office should contact you within 2 days regarding scheduling

## 2013-11-06 ENCOUNTER — Encounter (INDEPENDENT_AMBULATORY_CARE_PROVIDER_SITE_OTHER): Payer: Self-pay

## 2013-11-08 ENCOUNTER — Telehealth: Payer: Self-pay | Admitting: Obstetrics & Gynecology

## 2013-11-08 NOTE — Telephone Encounter (Signed)
Left message for pt informing her I had received all of the results from her MMG and biopsies and was aware she had breast cancer.  Has surgery scheduled with Dr. Excell Seltzer 11/13/13.  Told pt I am here for her for any questions or needs she has and that I am really sorry she is having to do through this right now.  Left number for her to reach me if she ever needs.

## 2013-11-09 ENCOUNTER — Encounter (HOSPITAL_BASED_OUTPATIENT_CLINIC_OR_DEPARTMENT_OTHER): Payer: Self-pay | Admitting: *Deleted

## 2013-11-09 ENCOUNTER — Encounter (HOSPITAL_BASED_OUTPATIENT_CLINIC_OR_DEPARTMENT_OTHER)
Admission: RE | Admit: 2013-11-09 | Discharge: 2013-11-09 | Disposition: A | Discharge status: Home / Self Care | Payer: Medicare Other | Source: Ambulatory Visit | Attending: General Surgery | Admitting: General Surgery

## 2013-11-09 DIAGNOSIS — M129 Arthropathy, unspecified: Secondary | ICD-10-CM | POA: Diagnosis not present

## 2013-11-09 DIAGNOSIS — Z01810 Encounter for preprocedural cardiovascular examination: Secondary | ICD-10-CM | POA: Insufficient documentation

## 2013-11-09 DIAGNOSIS — Z9889 Other specified postprocedural states: Secondary | ICD-10-CM | POA: Diagnosis not present

## 2013-11-09 DIAGNOSIS — C50319 Malignant neoplasm of lower-inner quadrant of unspecified female breast: Secondary | ICD-10-CM | POA: Insufficient documentation

## 2013-11-09 DIAGNOSIS — R0602 Shortness of breath: Secondary | ICD-10-CM | POA: Diagnosis not present

## 2013-11-09 DIAGNOSIS — L719 Rosacea, unspecified: Secondary | ICD-10-CM | POA: Diagnosis not present

## 2013-11-09 DIAGNOSIS — Z9089 Acquired absence of other organs: Secondary | ICD-10-CM | POA: Diagnosis not present

## 2013-11-09 DIAGNOSIS — F411 Generalized anxiety disorder: Secondary | ICD-10-CM | POA: Diagnosis not present

## 2013-11-09 DIAGNOSIS — M545 Low back pain, unspecified: Secondary | ICD-10-CM | POA: Diagnosis not present

## 2013-11-09 DIAGNOSIS — I252 Old myocardial infarction: Secondary | ICD-10-CM | POA: Diagnosis not present

## 2013-11-09 DIAGNOSIS — I10 Essential (primary) hypertension: Secondary | ICD-10-CM | POA: Diagnosis not present

## 2013-11-09 LAB — BASIC METABOLIC PANEL
Anion gap: 15 (ref 5–15)
BUN: 16 mg/dL (ref 6–23)
CHLORIDE: 100 meq/L (ref 96–112)
CO2: 27 mEq/L (ref 19–32)
CREATININE: 0.78 mg/dL (ref 0.50–1.10)
Calcium: 9.3 mg/dL (ref 8.4–10.5)
GFR calc non Af Amer: 81 mL/min — ABNORMAL LOW (ref >90)
Glucose, Bld: 100 mg/dL — ABNORMAL HIGH (ref 70–99)
POTASSIUM: 4.1 meq/L (ref 3.7–5.3)
SODIUM: 142 meq/L (ref 137–147)

## 2013-11-09 NOTE — Progress Notes (Signed)
Seen by Dr Al Corpus in PAT, and EKG reviewed.

## 2013-11-09 NOTE — Progress Notes (Signed)
Very nice lady-looks much younger than 73-lives independent living [facility]-will have sister with her-came in for ekg-bmet- Had back surgery 10/14-had full cardiac work up dr smith-ef 85%-cleared then-did not need to come back

## 2013-11-11 ENCOUNTER — Telehealth: Payer: Self-pay

## 2013-11-11 ENCOUNTER — Telehealth: Payer: Self-pay | Admitting: Interventional Cardiology

## 2013-11-11 NOTE — Telephone Encounter (Signed)
Cardiac clearance given to medical records to fax to [facility] fax # [phone]

## 2013-11-11 NOTE — Telephone Encounter (Signed)
Received request from Nurse fax box, documents faxed for surgical clearance. BX:[id number]   Fax number: [phone] Attention: 9.16.15/km

## 2013-11-13 ENCOUNTER — Encounter (HOSPITAL_BASED_OUTPATIENT_CLINIC_OR_DEPARTMENT_OTHER)
Admission: RE | Disposition: A | Discharge status: Home / Self Care | Payer: Self-pay | Source: Ambulatory Visit | Attending: General Surgery

## 2013-11-13 ENCOUNTER — Encounter (HOSPITAL_BASED_OUTPATIENT_CLINIC_OR_DEPARTMENT_OTHER): Payer: Medicare Other | Admitting: Anesthesiology

## 2013-11-13 ENCOUNTER — Ambulatory Visit (HOSPITAL_BASED_OUTPATIENT_CLINIC_OR_DEPARTMENT_OTHER): Payer: Medicare Other | Admitting: Anesthesiology

## 2013-11-13 ENCOUNTER — Ambulatory Visit (HOSPITAL_BASED_OUTPATIENT_CLINIC_OR_DEPARTMENT_OTHER)
Admission: RE | Admit: 2013-11-13 | Discharge: 2013-11-13 | Disposition: A | Discharge status: Home / Self Care | Payer: Medicare Other | Source: Ambulatory Visit | Attending: General Surgery | Admitting: General Surgery

## 2013-11-13 ENCOUNTER — Ambulatory Visit
Admission: RE | Admit: 2013-11-13 | Discharge: 2013-11-13 | Disposition: A | Discharge status: Home / Self Care | Payer: Medicare Other | Source: Ambulatory Visit | Attending: General Surgery | Admitting: General Surgery

## 2013-11-13 ENCOUNTER — Encounter (HOSPITAL_BASED_OUTPATIENT_CLINIC_OR_DEPARTMENT_OTHER): Payer: Self-pay | Admitting: *Deleted

## 2013-11-13 ENCOUNTER — Other Ambulatory Visit (INDEPENDENT_AMBULATORY_CARE_PROVIDER_SITE_OTHER): Payer: Self-pay | Admitting: General Surgery

## 2013-11-13 ENCOUNTER — Ambulatory Visit (HOSPITAL_COMMUNITY)
Admission: RE | Admit: 2013-11-13 | Discharge: 2013-11-13 | Disposition: A | Discharge status: Home / Self Care | Payer: Medicare Other | Source: Ambulatory Visit | Attending: General Surgery | Admitting: General Surgery

## 2013-11-13 DIAGNOSIS — I252 Old myocardial infarction: Secondary | ICD-10-CM | POA: Insufficient documentation

## 2013-11-13 DIAGNOSIS — C50319 Malignant neoplasm of lower-inner quadrant of unspecified female breast: Secondary | ICD-10-CM | POA: Insufficient documentation

## 2013-11-13 DIAGNOSIS — M129 Arthropathy, unspecified: Secondary | ICD-10-CM | POA: Insufficient documentation

## 2013-11-13 DIAGNOSIS — M545 Low back pain, unspecified: Secondary | ICD-10-CM | POA: Insufficient documentation

## 2013-11-13 DIAGNOSIS — Z9089 Acquired absence of other organs: Secondary | ICD-10-CM | POA: Insufficient documentation

## 2013-11-13 DIAGNOSIS — R0602 Shortness of breath: Secondary | ICD-10-CM | POA: Diagnosis not present

## 2013-11-13 DIAGNOSIS — C50312 Malignant neoplasm of lower-inner quadrant of left female breast: Secondary | ICD-10-CM

## 2013-11-13 DIAGNOSIS — I10 Essential (primary) hypertension: Secondary | ICD-10-CM | POA: Insufficient documentation

## 2013-11-13 DIAGNOSIS — Z9889 Other specified postprocedural states: Secondary | ICD-10-CM | POA: Insufficient documentation

## 2013-11-13 DIAGNOSIS — F411 Generalized anxiety disorder: Secondary | ICD-10-CM | POA: Insufficient documentation

## 2013-11-13 DIAGNOSIS — L719 Rosacea, unspecified: Secondary | ICD-10-CM | POA: Insufficient documentation

## 2013-11-13 HISTORY — PX: BREAST LUMPECTOMY WITH NEEDLE LOCALIZATION AND AXILLARY SENTINEL LYMPH NODE BX: SHX5760

## 2013-11-13 HISTORY — DX: Atherosclerotic heart disease of native coronary artery without angina pectoris: I25.10

## 2013-11-13 LAB — POCT HEMOGLOBIN-HEMACUE: HEMOGLOBIN: 13.3 g/dL (ref 12.0–15.0)

## 2013-11-13 SURGERY — BREAST LUMPECTOMY WITH NEEDLE LOCALIZATION AND AXILLARY SENTINEL LYMPH NODE BX
Anesthesia: General | Site: Breast | Laterality: Left

## 2013-11-13 MED ORDER — ONDANSETRON HCL 4 MG/2ML IJ SOLN
INTRAMUSCULAR | Status: DC | PRN
  Administered 2013-11-13: 4 mg via INTRAVENOUS

## 2013-11-13 MED ORDER — FENTANYL CITRATE 0.05 MG/ML IJ SOLN
INTRAMUSCULAR | Status: AC
  Filled 2013-11-13: qty 2

## 2013-11-13 MED ORDER — FENTANYL CITRATE 0.05 MG/ML IJ SOLN
INTRAMUSCULAR | Status: AC
  Filled 2013-11-13: qty 6

## 2013-11-13 MED ORDER — OXYCODONE HCL 5 MG PO TABS
5.0000 mg | ORAL_TABLET | Freq: Once | ORAL | Status: AC
  Administered 2013-11-13: 5 mg via ORAL

## 2013-11-13 MED ORDER — LACTATED RINGERS IV SOLN
INTRAVENOUS | Status: DC
  Administered 2013-11-13 (×2): via INTRAVENOUS

## 2013-11-13 MED ORDER — SODIUM CHLORIDE 0.9 % IJ SOLN
INTRAMUSCULAR | Status: AC
Start: 2013-11-13 — End: 2013-11-13
  Filled 2013-11-13: qty 10

## 2013-11-13 MED ORDER — CIPROFLOXACIN IN D5W 400 MG/200ML IV SOLN
INTRAVENOUS | Status: AC
  Filled 2013-11-13: qty 200

## 2013-11-13 MED ORDER — MIDAZOLAM HCL 2 MG/2ML IJ SOLN
INTRAMUSCULAR | Status: AC
  Filled 2013-11-13: qty 2

## 2013-11-13 MED ORDER — PROPOFOL 10 MG/ML IV BOLUS
INTRAVENOUS | Status: DC | PRN
  Administered 2013-11-13: 200 mg via INTRAVENOUS

## 2013-11-13 MED ORDER — OXYCODONE HCL 5 MG PO TABS
ORAL_TABLET | ORAL | Status: AC
  Filled 2013-11-13: qty 1

## 2013-11-13 MED ORDER — FENTANYL CITRATE 0.05 MG/ML IJ SOLN
25.0000 ug | INTRAMUSCULAR | Status: DC | PRN
  Administered 2013-11-13: 50 ug via INTRAVENOUS
  Administered 2013-11-13: 25 ug via INTRAVENOUS

## 2013-11-13 MED ORDER — FENTANYL CITRATE 0.05 MG/ML IJ SOLN
50.0000 ug | INTRAMUSCULAR | Status: DC | PRN
  Administered 2013-11-13: 100 ug via INTRAVENOUS

## 2013-11-13 MED ORDER — METHYLENE BLUE 1 % INJ SOLN
INTRAMUSCULAR | Status: DC | PRN
  Administered 2013-11-13: 2 mL via SUBMUCOSAL

## 2013-11-13 MED ORDER — BUPIVACAINE-EPINEPHRINE (PF) 0.5% -1:200000 IJ SOLN
INTRAMUSCULAR | Status: AC
  Filled 2013-11-13: qty 30

## 2013-11-13 MED ORDER — TECHNETIUM TC 99M SULFUR COLLOID FILTERED
1.0000 | Freq: Once | INTRAVENOUS | Status: AC | PRN
  Administered 2013-11-13: 1 via INTRADERMAL

## 2013-11-13 MED ORDER — METHYLENE BLUE 1 % INJ SOLN
INTRAMUSCULAR | Status: AC
  Filled 2013-11-13: qty 10

## 2013-11-13 MED ORDER — CIPROFLOXACIN IN D5W 400 MG/200ML IV SOLN
400.0000 mg | INTRAVENOUS | Status: AC
  Administered 2013-11-13 (×2): 400 mg via INTRAVENOUS

## 2013-11-13 MED ORDER — MIDAZOLAM HCL 2 MG/2ML IJ SOLN
1.0000 mg | INTRAMUSCULAR | Status: DC | PRN
  Administered 2013-11-13: 2 mg via INTRAVENOUS

## 2013-11-13 MED ORDER — BUPIVACAINE-EPINEPHRINE 0.5% -1:200000 IJ SOLN
INTRAMUSCULAR | Status: DC | PRN
  Administered 2013-11-13: 10 mL

## 2013-11-13 MED ORDER — HYDROCODONE-ACETAMINOPHEN 5-325 MG PO TABS: 1.0000 | ORAL_TABLET | ORAL | Status: DC | PRN

## 2013-11-13 MED ORDER — FENTANYL CITRATE 0.05 MG/ML IJ SOLN
INTRAMUSCULAR | Status: DC | PRN
  Administered 2013-11-13 (×2): 50 ug via INTRAVENOUS

## 2013-11-13 MED ORDER — SODIUM CHLORIDE 0.9 % IJ SOLN
INTRAMUSCULAR | Status: DC | PRN
  Administered 2013-11-13: 3 mL via INTRAVENOUS

## 2013-11-13 MED ORDER — DEXAMETHASONE SODIUM PHOSPHATE 4 MG/ML IJ SOLN
INTRAMUSCULAR | Status: DC | PRN
  Administered 2013-11-13: 10 mg via INTRAVENOUS

## 2013-11-13 SURGICAL SUPPLY — 61 items
ADH SKN CLS APL DERMABOND .7 (GAUZE/BANDAGES/DRESSINGS) ×1
APPLIER CLIP 11 MED OPEN (CLIP) ×3
APR CLP MED 11 20 MLT OPN (CLIP) ×1
BINDER BREAST XLRG (GAUZE/BANDAGES/DRESSINGS) ×2 IMPLANT
BLADE SURG 10 STRL SS (BLADE) ×3 IMPLANT
BLADE SURG 15 STRL LF DISP TIS (BLADE) ×1 IMPLANT
BLADE SURG 15 STRL SS (BLADE) ×3
CANISTER SUCT 1200ML W/VALVE (MISCELLANEOUS) ×3 IMPLANT
CHLORAPREP W/TINT 26ML (MISCELLANEOUS) ×3 IMPLANT
CLIP APPLIE 11 MED OPEN (CLIP) IMPLANT
CLIP TI WIDE RED SMALL 6 (CLIP) ×3 IMPLANT
COVER MAYO STAND STRL (DRAPES) ×3 IMPLANT
COVER PROBE W GEL 5X96 (DRAPES) ×3 IMPLANT
COVER TABLE BACK 60X90 (DRAPES) ×3 IMPLANT
DECANTER SPIKE VIAL GLASS SM (MISCELLANEOUS) IMPLANT
DERMABOND ADVANCED (GAUZE/BANDAGES/DRESSINGS) ×2
DERMABOND ADVANCED .7 DNX12 (GAUZE/BANDAGES/DRESSINGS) ×1 IMPLANT
DEVICE DUBIN W/COMP PLATE 8390 (MISCELLANEOUS) ×3 IMPLANT
DRAIN CHANNEL 19F RND (DRAIN) IMPLANT
DRAIN HEMOVAC 1/8 X 5 (WOUND CARE) IMPLANT
DRAPE LAPAROSCOPIC ABDOMINAL (DRAPES) ×3 IMPLANT
DRAPE UTILITY XL STRL (DRAPES) ×3 IMPLANT
ELECT COATED BLADE 2.86 ST (ELECTRODE) ×3 IMPLANT
ELECT REM PT RETURN 9FT ADLT (ELECTROSURGICAL) ×3
ELECTRODE REM PT RTRN 9FT ADLT (ELECTROSURGICAL) ×1 IMPLANT
EVACUATOR SILICONE 100CC (DRAIN) IMPLANT
GLOVE BIOGEL M STRL SZ7.5 (GLOVE) ×2 IMPLANT
GLOVE BIOGEL PI IND STRL 8 (GLOVE) ×1 IMPLANT
GLOVE BIOGEL PI INDICATOR 8 (GLOVE) ×4
GLOVE SS BIOGEL STRL SZ 7.5 (GLOVE) ×1 IMPLANT
GLOVE SUPERSENSE BIOGEL SZ 7.5 (GLOVE) ×2
GOWN STRL REUS W/ TWL LRG LVL3 (GOWN DISPOSABLE) ×1 IMPLANT
GOWN STRL REUS W/ TWL XL LVL3 (GOWN DISPOSABLE) ×1 IMPLANT
GOWN STRL REUS W/TWL LRG LVL3 (GOWN DISPOSABLE) ×3
GOWN STRL REUS W/TWL XL LVL3 (GOWN DISPOSABLE) ×3
KIT MARKER MARGIN INK (KITS) ×3 IMPLANT
NDL HYPO 25X1 1.5 SAFETY (NEEDLE) ×1 IMPLANT
NDL SAFETY ECLIPSE 18X1.5 (NEEDLE) ×1 IMPLANT
NEEDLE HYPO 18GX1.5 SHARP (NEEDLE) ×3
NEEDLE HYPO 25X1 1.5 SAFETY (NEEDLE) ×3 IMPLANT
NS IRRIG 1000ML POUR BTL (IV SOLUTION) ×3 IMPLANT
PACK BASIN DAY SURGERY FS (CUSTOM PROCEDURE TRAY) ×3 IMPLANT
PAD ALCOHOL SWAB (MISCELLANEOUS) ×3 IMPLANT
PENCIL BUTTON HOLSTER BLD 10FT (ELECTRODE) ×3 IMPLANT
PIN SAFETY STERILE (MISCELLANEOUS) IMPLANT
SLEEVE SCD COMPRESS KNEE MED (MISCELLANEOUS) ×3 IMPLANT
SPONGE LAP 18X18 X RAY DECT (DISPOSABLE) IMPLANT
SPONGE LAP 4X18 X RAY DECT (DISPOSABLE) ×3 IMPLANT
SUT ETHILON 3 0 FSL (SUTURE) IMPLANT
SUT MON AB 4-0 PC3 18 (SUTURE) IMPLANT
SUT MON AB 5-0 PS2 18 (SUTURE) ×3 IMPLANT
SUT SILK 3 0 SH 30 (SUTURE) IMPLANT
SUT VIC AB 3-0 SH 27 (SUTURE)
SUT VIC AB 3-0 SH 27X BRD (SUTURE) IMPLANT
SUT VICRYL 3-0 CR8 SH (SUTURE) ×2 IMPLANT
SYR CONTROL 10ML LL (SYRINGE) ×5 IMPLANT
TOWEL OR 17X24 6PK STRL BLUE (TOWEL DISPOSABLE) ×3 IMPLANT
TOWEL OR NON WOVEN STRL DISP B (DISPOSABLE) ×3 IMPLANT
TUBE CONNECTING 20'X1/4 (TUBING) ×1
TUBE CONNECTING 20X1/4 (TUBING) ×2 IMPLANT
YANKAUER SUCT BULB TIP NO VENT (SUCTIONS) ×3 IMPLANT

## 2013-11-13 NOTE — Anesthesia Postprocedure Evaluation (Signed)
  Anesthesia Post-op Note  Patient: Rachel Chase  Procedure(s) Performed: Procedure(s): NEEDLE LOCALIZATION LEFT BREAST LUMPECTOMY WITH LEFT  AXILLARY SENTINEL LYMPH NODE BX (Left)  Patient Location: PACU  Anesthesia Type:General  Level of Consciousness: awake  Airway and Oxygen Therapy: Patient Spontanous Breathing  Post-op Pain: mild  Post-op Assessment: Post-op Vital signs reviewed  Post-op Vital Signs: Reviewed  Last Vitals:  Filed Vitals:   11/13/13 1430  BP: 149/76  Pulse: 82  Temp:   Resp: 14    Complications: No apparent anesthesia complications

## 2013-11-13 NOTE — Op Note (Signed)
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 After extensive preoperative workup and discussion detailed elsewhere we elected to proceed with needle localized lumpectomy and left axillary sentinel lymph node biopsy as her initial surgical treatment.    Procedure Detail:  Following accurate needle localization at the breast center and following injection of 1 mCi of technetium sulfur colloid intradermally around the left nipple preoperatively in the holding area, the patient is brought to the operating room, placed in the supine position on the operating table and general LMA anesthesia induced. Under sterile technique after patient timeout injected 5 cc of dilute methylene blue subcutaneously beneath the left nipple and massaged this for several minutes. The entire breast and axilla and upper arm were then widely sterilely prepped and draped. She received preoperative IV antibiotics. PAS were in place. The patient timeout was again performed. The lumpectomy was approached initially. I made a transverse incision in a skin crease in the lower inner breast over the estimated position of the tumor and dissection was carried down into the subcutaneous tissue. Short skin and subcutaneous flaps were raised in all directions and the wire was brought into the specimen. Using cautery I then excised an adequate amount of breast tissue around the shaft and the tip of the wire. The specimen was removed and inked for margins. The specimen mammography showed the clip and the tumor  centrally located in the specimen with the intact wire. This was sent for permanent pathology. The wound was irrigated and hemostasis assured. The breast and subcutaneous tissue were closed with interrupted 3-0 Vicryl. Attention was then turned to the sentinel lymph node. A hot area in the left axilla was localized with the neoprobe and a small transverse incision made. Dissection was carried down through the subcutaneous tissue with cautery. The clavipectoral fascia was opened with careful blunt dissection. Using the neoprobe for guidance after fairly extensive exploration fairly high and medially in the axilla I encountered a small node with faint blue dye and high counts. This was excised with cautery and had ex vivo counts greater than 600. It was sent for permanent pathology as hot blue axillary sentinel lymph node #1. Following this  Counts in the axilla were 0 and I did not see any further blue dye and there were no palpable lymph nodes. This incision was urinated. A small bleeder was clipped. The deep tissue was closed with interrupted 3-0 Vicryl.    Findings: As above  Estimated Blood Loss:  less than 50 mL         Drains: none  Blood Given: none          Specimens: #1  Left breast lumpectomy       #2 hot blue left axillary sentinal lymph node        Complications:  * No complications entered in OR log *         Disposition: PACU - hemodynamically stable.         Condition: stable

## 2013-11-13 NOTE — H&P (View-Only) (Signed)
Chief Complaint: New diagnosis of breast cancer  History:    JUNE RODE is a 73 y.o. postmenopausal female referred by Dr. 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 She recently presented for a screening mamogram revealing A possible mass in the lower inner breast.  Subsequent imaging included diagnostic mamogram showing A suspicious mass and ultrasound showing A 1.3 x 0.8 x 0.7 cm irregular hypoechoic mass in the 7:30 position of the left breast 7 cm from the nipple.   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 She is seen now in The office for initial treatment planning.  She has experienced No breast symptoms, specifically lump or pain or nipple discharge or skin changes.  She does have a personal history of A benign core biopsy of the left breast with findings of a benign cyst.  Findings at that time were the following:  Tumor size: 1.3 cm  Tumor grade: 2  Estrogen Receptor: positive Progesterone Receptor: positive  Her-2 neu: negative  Lymph node status: negative   Past Medical History  Diagnosis Date  . Hypertension   . Back pain     Lower Back pain - takes Cortisone shots  . Shortness of breath   . Pneumonia   . Arthritis   . Anxiety   . Rosacea 2008    both eyes  . Bone spur 2009    lower back  . Complication of anesthesia     throat irrition from past intubations  . 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 Eye surgery      Laser Eye Surgery approx 2007  . Tubal ligation    . Tonsillectomy and adenoidectomy    . Hysteroscopy  9/10    D&C (polyp)  . Breast biopsy  8/10    fibrocystic changes  . Heart stent      failed-no stents    Current Outpatient Prescriptions  Medication Sig Dispense Refill  . albuterol (PROVENTIL HFA;VENTOLIN HFA) 108 (90 BASE) MCG/ACT inhaler Inhale 2  puffs into the lungs daily as needed for wheezing or shortness of breath.      . Artificial Tear Solution (SOOTHE HYDRATION OP) Place 1 drop into both eyes daily as needed (dry eyes).      Marland Kitchen aspirin EC 81 MG tablet Take 81 mg by mouth daily with supper.      Marland Kitchen atorvastatin (LIPITOR) 40 MG tablet Take 40 mg by mouth at bedtime.       . gabapentin (NEURONTIN) 300 MG capsule Take 300 mg by mouth every evening.      Marland Kitchen HYDROcodone-acetaminophen (NORCO/VICODIN) 5-325 MG per tablet Take 1 tablet by mouth 2 (two) times daily.      Marland Kitchen ibuprofen (ADVIL,MOTRIN) 600 MG tablet Take 600 mg by mouth 2 (two) times daily.       Marland Kitchen lisinopril-hydrochlorothiazide (PRINZIDE,ZESTORETIC) 20-12.5 MG per tablet Take 1 tablet by mouth 2 (two) times daily.      Marland Kitchen LORazepam (ATIVAN) 1 MG tablet Take 1 mg by mouth at bedtime.      . methocarbamol (ROBAXIN) 500 MG tablet Take 1 tablet (500 mg total) by mouth every 6 (six) hours as needed.  60 tablet  3  . metoprolol (LOPRESSOR) 50 MG tablet Take 50 mg by mouth 2 (two) times daily.      . Multiple  Vitamins-Minerals (PRESERVISION/LUTEIN PO) Take 1 tablet by mouth 2 (two) times daily.       . nitroGLYCERIN (NITROSTAT) 0.4 MG SL tablet Place 0.4 mg under the tongue every 5 (five) minutes as needed for chest pain.       Marland Kitchen oxyCODONE-acetaminophen (PERCOCET/ROXICET) 5-325 MG per tablet Take 1-2 tablets by mouth every 4 (four) hours as needed for pain.  60 tablet  0  . Vitamin D, Ergocalciferol, (DRISDOL) 50000 UNITS CAPS capsule Take 50,000 Units by mouth every 30 (thirty) days.        No current facility-administered medications for this visit.    Family History  Problem Relation Age of Onset  . Rectal cancer Sister   . Breast cancer Sister   . Breast cancer Maternal Grandmother 90  . Congestive Heart Failure Father   . Breast cancer Maternal Aunt     History   Social History  . Marital Status: Widowed    Spouse Name: N/A    Number of Children: N/A  . Years of  Education: N/A   Social History Main Topics  . Smoking status: Former Smoker -- 1.00 packs/day for 45 years    Types: Cigarettes    Quit date: 04/17/2010  . Smokeless tobacco: Never Used  . Alcohol Use: 0.0 - 0.5 oz/week    0-1 drink(s) per week     Comment: occ  . Drug Use: No  . Sexual Activity: No   Other Topics Concern  . Not on file   Social History Narrative  . No narrative on file     Review of Systems Constitutional: negative Respiratory: positive for dyspnea on exertion Cardiovascular: negative Gastrointestinal: negative, colonoscopy is up-to-date Genitourinary:negative Neurological: negative     Objective:  LMP 02/27/2008 BP 126/80  Pulse 82  Temp(Src) 97.2 F (36.2 C)  Ht $R'5\' 8"'nk$  (1.727 m)  Wt 187 lb (84.823 kg)  BMI 28.44 kg/m2  LMP 02/27/2008  General: Alert, well-developed Caucasian female, in no distress Skin: Warm and dry without rash or infection. HEENT: No palpable masses or thyromegaly. Sclera nonicteric. Pupils equal round and reactive. Oropharynx clear. Breasts: Some bruising and tenderness in the inferior left breast without distinct mass. No other abnormalities palpable in either breast. No palpable axillary adenopathy. Lymph nodes: No cervical, supraclavicular, or inguinal nodes palpable. Lungs: Breath sounds clear and equal without increased work of breathing Cardiovascular: Regular rate and rhythm without murmur. No JVD or edema. Peripheral pulses intact. Abdomen: Nondistended. Soft and nontender. No masses palpable. No organomegaly. No palpable hernias. Extremities: No edema or joint swelling or deformity. No chronic venous stasis changes. Neurologic: Alert and fully oriented. Gait normal.   Laboratory data:  CBC:  Lab Results  Component Value Date   WBC 9.4 12/19/2012   RBC 3.51* 12/19/2012   HGB 10.2* 12/19/2012   HCT 29.7* 12/19/2012   PLT 175 12/19/2012  ]  [facility] Labs:  Lab Results  Component Value Date   NA 137 12/17/2012    K 3.8 12/17/2012   CL 103 12/17/2012   CO2 25 12/17/2012   BUN 23 12/17/2012   CREATININE 0.84 12/17/2012   CALCIUM 7.5* 12/17/2012   PROT 6.4 01/18/2011   BILITOT 0.3 01/18/2011   ALKPHOS 67 01/18/2011   AST 142* 01/18/2011   ALT 156* 01/18/2011     Assessment  73 y.o. female with a new diagnosis of cancer of the the left breast lower inner quadrant.  Clinical IA, estrogen receptor positive, progesterone receptor positive, Her2/Neu protein/oncogene negative  and Ki 67 - growth factor low. I discussed with the patient and family members present today initial surgical treatment options. We discussed options of breast conservation with lumpectomy or total mastectomy and sentinal lymph node biopsy/dissection. Options for reconstruction were discussed. After discussion they have elected to proceed with left partial mastectomy With axillary sentinel lymph node biopsy. We discussed that with her family history I suspect genetic testing would be indicated. She would like to pursue this but she wants breast conservation regardless and states that she will discuss genetic testing with her medical oncologist before proceeding..  We discussed the indications and nature of the procedure, and expected recovery, in detail. Surgical risks including anesthetic complications, cardiorespiratory complications, bleeding, infection, wound healing complications, blood clots, lymphedema, local and distant recurrence and possible need for further surgery based on the final pathology was discussed and understood.  Chemotherapy, hormonal therapy and radiation therapy have been discussed. They have been provided with literature regarding the treatment of breast cancer.  All questions were answered. They understand and agree to proceed and we will go ahead with scheduling.  Plan Needle localized left partial mastectomy Left axillary sentinel lymph node biopsy Under general anesthesia as an outpatient. We will obtain  preoperative cardiac clearance from Dr. Tommie Raymond MD, FACS  10/21/2013, 2:35 PM

## 2013-11-13 NOTE — Anesthesia Postprocedure Evaluation (Signed)
  Anesthesia Post-op Note  Patient: Rachel Chase  Procedure(s) Performed: Procedure(s): NEEDLE LOCALIZATION LEFT BREAST LUMPECTOMY WITH LEFT  AXILLARY SENTINEL LYMPH NODE BX (Left)  Patient Location: PACU  Anesthesia Type:General  Level of Consciousness: awake  Airway and Oxygen Therapy: Patient Spontanous Breathing  Post-op Pain: mild  Post-op Assessment: Post-op Vital signs reviewed  Post-op Vital Signs: Reviewed  Last Vitals:  Filed Vitals:   11/13/13 1630  BP: 157/72  Pulse: 80  Temp: 36.4 C  Resp: 20    Complications: No apparent anesthesia complications

## 2013-11-13 NOTE — Anesthesia Procedure Notes (Addendum)
Anesthesia Regional Block:  Pectoralis block  Pre-Anesthetic Checklist: ,, timeout performed, Correct Patient, Correct Site, Correct Laterality, Correct Procedure, Correct Position, site marked, Risks and benefits discussed, pre-op evaluation,  Laterality: Left  Prep: chloraprep       Needles:   Needle Type: Stimulator Needle - 80          Additional Needles:  Procedures: Doppler guided and ultrasound guided (picture in chart) Pectoralis block Narrative:  Start time: 11/13/2013 11:10 AM End time: 11/13/2013 11:20 AM Anesthesiologist: Dr. Emogene Morgan   Procedure Name: LMA Insertion Date/Time: 11/13/2013 12:57 PM Performed by: Melynda Ripple D Pre-anesthesia Checklist: Patient identified, Emergency Drugs available, Suction available and Patient being monitored Patient Re-evaluated:Patient Re-evaluated prior to inductionOxygen Delivery Method: Circle System Utilized Preoxygenation: Pre-oxygenation with 100% oxygen Intubation Type: IV induction Ventilation: Mask ventilation without difficulty LMA: LMA inserted LMA Size: 4.0 Number of attempts: 1 Airway Equipment and Method: bite block Placement Confirmation: positive ETCO2 Tube secured with: Tape Dental Injury: Teeth and Oropharynx as per pre-operative assessment

## 2013-11-13 NOTE — Anesthesia Preprocedure Evaluation (Signed)
Anesthesia Evaluation  Patient identified by MRN, date of birth, ID band Patient awake    Reviewed: Allergy & Precautions, H&P , NPO status , reviewed documented beta blocker date and time   Airway Mallampati: II      Dental   Pulmonary shortness of breath, pneumonia -, former smoker,          Cardiovascular hypertension, + CAD and + Past MI  Cardiac history noted. CE   Neuro/Psych    GI/Hepatic negative GI ROS, Neg liver ROS,   Endo/Other    Renal/GU negative Renal ROS     Musculoskeletal   Abdominal   Peds  Hematology   Anesthesia Other Findings   Reproductive/Obstetrics                           Anesthesia Physical Anesthesia Plan  ASA: III  Anesthesia Plan: General   Post-op Pain Management:    Induction: Intravenous  Airway Management Planned: Oral ETT  Additional Equipment:   Intra-op Plan:   Post-operative Plan: Extubation in OR  Informed Consent: I have reviewed the patients History and Physical, chart, labs and discussed the procedure including the risks, benefits and alternatives for the proposed anesthesia with the patient or authorized representative who has indicated his/her understanding and acceptance.   Dental advisory given  Plan Discussed with: CRNA and Anesthesiologist  Anesthesia Plan Comments:         Anesthesia Quick Evaluation

## 2013-11-13 NOTE — Progress Notes (Signed)
Assisted Dr. Oletta Lamas and Dr Al Corpus with left, ultrasound guided, pectoralis block. Side rails up, monitors on throughout procedure. See vital signs in flow sheet. Tolerated Procedure well.

## 2013-11-13 NOTE — Progress Notes (Signed)
Emotional support during breast injections °

## 2013-11-13 NOTE — Discharge Instructions (Signed)
[facility] °Office Phone Number [phone] ° °BREAST BIOPSY/ PARTIAL MASTECTOMY: POST OP INSTRUCTIONS ° °Always review your discharge instruction sheet given to you by the facility where your surgery was performed. ° °IF YOU HAVE DISABILITY OR FAMILY LEAVE FORMS, YOU MUST BRING THEM TO THE OFFICE FOR PROCESSING.  DO NOT GIVE THEM TO YOUR DOCTOR. ° °1. A prescription for pain medication may be given to you upon discharge.  Take your pain medication as prescribed, if needed.  If narcotic pain medicine is not needed, then you may take acetaminophen (Tylenol) or ibuprofen (Advil) as needed. °2. Take your usually prescribed medications unless otherwise directed °3. If you need a refill on your pain medication, please contact your pharmacy.  They will contact our office to request authorization.  Prescriptions will not be filled after 5pm or on week-ends. °4. You should eat very light the first 24 hours after surgery, such as soup, crackers, pudding, etc.  Resume your normal diet the day after surgery. °5. Most patients will experience some swelling and bruising in the breast.  Ice packs and a good support bra will help.  Swelling and bruising can take several days to resolve.  °6. It is common to experience some constipation if taking pain medication after surgery.  Increasing fluid intake and taking a stool softener will usually help or prevent this problem from occurring.  A mild laxative (Milk of Magnesia or Miralax) should be taken according to package directions if there are no bowel movements after 48 hours. °7. Unless discharge instructions indicate otherwise, you may remove your bandages 24-48 hours after surgery, and you may shower at that time.  You may have steri-strips (small skin tapes) in place directly over the incision.  These strips should be left on the skin for 7-10 days.  If your surgeon used skin glue on the incision, you may shower in 24 hours.  The glue will flake off over the  next 2-3 weeks.  Any sutures or staples will be removed at the office during your follow-up visit. °8. ACTIVITIES:  You may resume regular daily activities (gradually increasing) beginning the next day.  Wearing a good support bra or sports bra minimizes pain and swelling.  You may have sexual intercourse when it is comfortable. °a. You may drive when you no longer are taking prescription pain medication, you can comfortably wear a seatbelt, and you can safely maneuver your car and apply brakes. °b. RETURN TO WORK:  ______________________________________________________________________________________ °9. You should see your doctor in the office for a follow-up appointment approximately two weeks after your surgery.  Your doctor’s nurse will typically make your follow-up appointment when she calls you with your pathology report.  Expect your pathology report 2-3 business days after your surgery.  You may call to check if you do not hear from us after three days. °10. OTHER INSTRUCTIONS: _______________________________________________________________________________________________ _____________________________________________________________________________________________________________________________________ °_____________________________________________________________________________________________________________________________________ °_____________________________________________________________________________________________________________________________________ ° °WHEN TO CALL YOUR DOCTOR: °1. Fever over 101.0 °2. Nausea and/or vomiting. °3. Extreme swelling or bruising. °4. Continued bleeding from incision. °5. Increased pain, redness, or drainage from the incision. ° °The clinic staff is available to answer your questions during regular business hours.  Please don’t hesitate to call and ask to speak to one of the nurses for clinical concerns.  If you have a medical emergency, go to the nearest  emergency room or call 911.  A surgeon from [facility] is always on call at the hospital. ° °For further questions, please visit [email or web address]  ° ° °  [email or web address]  ° ° ° °Post Anesthesia Home Care Instructions ° °Activity: °Get plenty of rest for the remainder of the day. A responsible adult should stay with you for 24 hours following the procedure.  °For the next 24 hours, DO NOT: °-Drive a car °-Operate machinery °-Drink alcoholic beverages °-Take any medication unless instructed by your physician °-Make any legal decisions or sign important papers. ° °Meals: °Start with liquid foods such as gelatin or soup. Progress to regular foods as tolerated. Avoid greasy, spicy, heavy foods. If nausea and/or vomiting occur, drink only clear liquids until the nausea and/or vomiting subsides. Call your physician if vomiting continues. ° °Special Instructions/Symptoms: °Your throat may feel dry or sore from the anesthesia or the breathing tube placed in your throat during surgery. If this causes discomfort, gargle with warm salt water. The discomfort should disappear within 24 hours. ° ° °Regional Anesthesia Blocks ° °1. Numbness or the inability to move the "blocked" extremity may last from 3-48 hours after placement. The length of time depends on the medication injected and your individual response to the medication. If the numbness is not going away after 48 hours, call your surgeon. ° °2. The extremity that is blocked will need to be protected until the numbness is gone and the  Strength has returned. Because you cannot feel it, you will need to take extra care to avoid injury. Because it may be weak, you may have difficulty moving it or using it. You may not know what position it is in without looking at it while the block is in effect. ° °3. For blocks in the legs and feet, returning to weight bearing and walking needs to be done carefully. You will need to wait until the numbness is entirely gone and the  strength has returned. You should be able to move your leg and foot normally before you try and bear weight or walk. You will need someone to be with you when you first try to ensure you do not fall and possibly risk injury. ° °4. Bruising and tenderness at the needle site are common side effects and will resolve in a few days. ° °5. Persistent numbness or new problems with movement should be communicated to the surgeon or the [facility] ([phone])/ [facility] ([phone]). °

## 2013-11-13 NOTE — Transfer of Care (Signed)
Immediate Anesthesia Transfer of Care Note  Patient: Rachel Chase  Procedure(s) Performed: Procedure(s): NEEDLE LOCALIZATION LEFT BREAST LUMPECTOMY WITH LEFT  AXILLARY SENTINEL LYMPH NODE BX (Left)  Patient Location: PACU  Anesthesia Type:General and Regional  Level of Consciousness: sedated  Airway & Oxygen Therapy: Patient Spontanous Breathing and Patient connected to face mask oxygen  Post-op Assessment: Report given to PACU RN and Post -op Vital signs reviewed and stable  Post vital signs: Reviewed and stable  Complications: No apparent anesthesia complications

## 2013-11-13 NOTE — Interval H&P Note (Signed)
History and Physical Interval Note:  11/13/2013 12:28 PM  Rachel Chase  has presented today for surgery, with the diagnosis of Cancer of left breast  The various methods of treatment have been discussed with the patient and family. After consideration of risks, benefits and other options for treatment, the patient has consented to  Procedure(s): NEEDLE LOCALIZATION LEFT BREAST LUMPECTOMY WITH LEFT  AXILLARY SENTINEL LYMPH NODE BX (Left) as a surgical intervention .  The patient's history has been reviewed, patient examined, no change in status, stable for surgery.  I have reviewed the patient's chart and labs.  Questions were answered to the patient's satisfaction.     Hanadi Stanly T

## 2013-11-16 ENCOUNTER — Encounter (HOSPITAL_BASED_OUTPATIENT_CLINIC_OR_DEPARTMENT_OTHER): Payer: Self-pay | Admitting: General Surgery

## 2013-11-16 ENCOUNTER — Encounter: Payer: Self-pay | Admitting: General Surgery

## 2013-11-16 DIAGNOSIS — E785 Hyperlipidemia, unspecified: Secondary | ICD-10-CM | POA: Insufficient documentation

## 2013-12-03 ENCOUNTER — Telehealth: Payer: Self-pay | Admitting: *Deleted

## 2013-12-03 ENCOUNTER — Encounter: Payer: Self-pay | Admitting: *Deleted

## 2013-12-03 NOTE — Progress Notes (Signed)
Ordered oncotype per Dr. Excell Seltzer.  Faxed requisition to pathology and confirmed receipt with [facility].

## 2013-12-03 NOTE — Telephone Encounter (Signed)
Call pt and her schedule does not work out with the dates and times that I have available w/ Dr. Jana Hakim and she is fine with seeing Dr. Lindi Adie.  Confirmed 12/11/13 appt w/ her.  Mailed before appt letter, welcoming packet & intake form to pt.  Emailed Engineer, civil (consulting) at [employer] to make her aware.

## 2013-12-04 ENCOUNTER — Telehealth: Payer: Self-pay | Admitting: *Deleted

## 2013-12-04 NOTE — Telephone Encounter (Signed)
Received request from [facility] to change appt due to Oncotype Dx test ordered and results will not be back on 10/16.  Called pt to inform her and confirmed 12/18/13 appt w/ her.  Made Keisha aware.

## 2013-12-08 ENCOUNTER — Telehealth: Payer: Self-pay | Admitting: *Deleted

## 2013-12-08 NOTE — Telephone Encounter (Signed)
Called pt and discussed oncotype dx testing. Discussed reasoning for testing and cost if any. Received verbal understanding. Pt denies further needs or questions. Encourage pt to call with concerns. Contact information given.

## 2013-12-09 ENCOUNTER — Ambulatory Visit: Payer: Medicare Other | Admitting: Radiation Oncology

## 2013-12-09 ENCOUNTER — Ambulatory Visit: Payer: Medicare Other

## 2013-12-11 ENCOUNTER — Ambulatory Visit: Payer: Medicare Other | Admitting: Hematology and Oncology

## 2013-12-11 ENCOUNTER — Ambulatory Visit: Payer: Medicare Other

## 2013-12-14 ENCOUNTER — Encounter (HOSPITAL_COMMUNITY): Payer: Self-pay

## 2013-12-15 ENCOUNTER — Encounter: Payer: Self-pay | Admitting: *Deleted

## 2013-12-15 NOTE — Progress Notes (Signed)
Received Oncotype Dx results of 10.  Placed copy in Dr. Geralyn Flash box and took a copy to HIM to scan.

## 2013-12-18 ENCOUNTER — Telehealth: Payer: Self-pay | Admitting: Hematology and Oncology

## 2013-12-18 ENCOUNTER — Ambulatory Visit: Payer: Medicare Other

## 2013-12-18 ENCOUNTER — Ambulatory Visit (HOSPITAL_BASED_OUTPATIENT_CLINIC_OR_DEPARTMENT_OTHER): Payer: Medicare Other | Admitting: Hematology and Oncology

## 2013-12-18 VITALS — BP 150/75 | HR 112 | Temp 98.1°F | Resp 18 | Ht 68.0 in | Wt 190.7 lb

## 2013-12-18 DIAGNOSIS — C50312 Malignant neoplasm of lower-inner quadrant of left female breast: Secondary | ICD-10-CM

## 2013-12-18 DIAGNOSIS — Z170 Estrogen receptor positive status [ER+]: Secondary | ICD-10-CM

## 2013-12-18 DIAGNOSIS — I2510 Atherosclerotic heart disease of native coronary artery without angina pectoris: Secondary | ICD-10-CM

## 2013-12-18 DIAGNOSIS — I10 Essential (primary) hypertension: Secondary | ICD-10-CM

## 2013-12-18 MED ORDER — ANASTROZOLE 1 MG PO TABS: 1.0000 mg | ORAL_TABLET | Freq: Every day | ORAL | Status: DC

## 2013-12-18 NOTE — Assessment & Plan Note (Signed)
Left breast invasive ductal carcinoma status post lumpectomy on 11/13/2013 ER/PR percent PR 98% gases 76% HER-2 negative ratio 1.65  Pathology counseling:Discussed with the patient, the details of pathology including the type of breast cancer,the staging, the significance of ER, PR and HER-2/neu receptors and the implications for treatment. Based on Oncotype DX recurrence score of 10, she is low risk of recurrence and hence does not require chemotherapy.  Recommendation: Patient should proceed to do adjuvant radiation therapy and follow that up with antiestrogen therapy with Arimidex.  Arimidex counseling:We discussed the risks and benefits of anti-estrogen therapy with aromatase inhibitors. These include but not limited to insomnia, hot flashes, mood changes, vaginal dryness, bone density loss, and weight gain. Although rare, serious side effects including endometrial cancer, risk of blood clots were also discussed. We strongly believe that the benefits far outweigh the risks. Patient understands these risks and consented to starting treatment. Planned treatment duration is 5 years.  Patient will start Arimidex after finishing with radiation therapy. I gave her a prescription for the drug. She will return back in February for evaluation of tolerability and toxicities.

## 2013-12-18 NOTE — Progress Notes (Signed)
Location of Breast Cancer:lower inner left breast  Histology per Pathology Report:   11/13/13 Diagnosis 1. Breast, lumpectomy, left - INVASIVE DUCTAL CARCINOMA, 1.4 CM. - DUCTAL CARCINOMA IN SITU. - MARGINS NOT INVOLVED. - CLOSEST MARGINS 0.5 CM FROM ANTERIOR, POSTERIOR AND INFERIOR MARGINS. 2. Lymph node, sentinel, biopsy, left #1 - ONE BENIGN LYMPH NODE (0/1).  10/13/13 Diagnosis Breast, left, needle core biopsy, 7:30 o'clock - INVASIVE DUCTAL CARCINOMA. - SEE COMMENT.  Receptor Status: ER(100% +), PR (98%+), Her2-neu (negative)  Did patient present with symptoms (if so, please note symptoms) or was this found on screening mammography?: screening mammography  Past/Anticipated interventions by surgeon, if any: 11/13/13 Procedure: NEEDLE LOCALIZATION LEFT BREAST LUMPECTOMY WITH LEFT  AXILLARY SENTINEL LYMPH NODE BX;  Surgeon: Excell Seltzer, MD;  Location: [place];  Service: General;  Laterality: Left;  Past/Anticipated interventions by medical oncology, if any: Plan per Dr. Lindi Adie is for adjuvant radiation therapy and follow that up with antiestrogen therapy with Arimidex.  Lymphedema issues, if any:  no  Pain issues, if any:  Takes 1/2 norco tablet a day due to arthritis.  OBGYN history: 68 years at menarche, 51 years at birth of first child, has 2 living children, 1 passed away from cardiomyapothy, used bcp for 10 years, used hormone replacement for 15-20 years.  SAFETY ISSUES:  Prior radiation? no  Pacemaker/ICD? no  Possible current pregnancy?no  Is the patient on methotrexate? no  Current Complaints / other details: Patient has 2 children, currently lives at [facility] at [employer].

## 2013-12-18 NOTE — Progress Notes (Signed)
[facility] NOTE  Patient Care Team: Geoffery Lyons, MD as PCP - General (Internal Medicine)  CHIEF COMPLAINTS/PURPOSE OF CONSULTATION:  Breast cancer  HISTORY OF PRESENTING ILLNESS:  Rachel Chase 73 y.o. female is here because of recent diagnosis of left breast cancer. She underwent a screening mammogram that revealed abnormalities in the breast that led to a biopsy followed by lumpectomy done by Dr. Excell Seltzer on 11/13/2013. 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 She Oncotype DX sent which came back as low risk of recurrence with a score of 10. She is here to discuss the results of Oncotype DX as well as to come up with the adjuvant treatment plan.  I reviewed her records extensively and collaborated the history with the patient.  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 N0 M0 stage IA ER 100% PR 98% Ki-67 6% HER-2 negative ratio 1.56   MEDICAL HISTORY:  Past Medical History  Diagnosis Date  . Hypertension   . Back pain     Lower Back pain - takes Cortisone shots  . Shortness of breath   . Pneumonia   . Arthritis   . Anxiety   . Rosacea 2008    both eyes  . Bone spur 2009    lower back  . MI (myocardial infarction) 11/12    [facility]  . Coronary artery disease   . Complication of anesthesia     throat irrition from past intubations, intubation granulomas  . Hyperlipemia     SURGICAL HISTORY: Past Surgical History  Procedure Laterality Date  . Eye surgery      Laser Eye Surgery approx 2007  . Tubal ligation    . Tonsillectomy and adenoidectomy    . Hysteroscopy  9/10    D&C (polyp)  . Breast biopsy  8/10    fibrocystic changes  . Heart stent      failed-no stents  . Cataract extraction      both  . Breast lumpectomy with  needle localization and axillary sentinel lymph node bx Left 11/13/2013    Procedure: NEEDLE LOCALIZATION LEFT BREAST LUMPECTOMY WITH LEFT  AXILLARY SENTINEL LYMPH NODE BX;  Surgeon: Excell Seltzer, MD;  Location: [facility];  Service: General;  Laterality: Left;    SOCIAL HISTORY: History   Social History  . Marital Status: Widowed    Spouse Name: N/A    Number of Children: N/A  . Years of Education: N/A   Occupational History  . Not on file.   Social History Main Topics  . Smoking status: Former Smoker -- 1.00 packs/day for 45 years    Types: Cigarettes    Quit date: 04/17/1998  . Smokeless tobacco: Never Used  . Alcohol Use: 0.0 - 0.5 oz/week    0-1 drink(s) per week     Comment: occ  . Drug Use: No  . Sexual Activity: No   Other Topics Concern  . Not on file   Social History Narrative  . No narrative on file    FAMILY HISTORY: Family History  Problem Relation Age of Onset  . Rectal cancer Sister   . Breast cancer Sister   . Breast cancer Maternal Grandmother 90  . Congestive Heart Failure Father   . Breast cancer Maternal Aunt   .  CVA Father     MI, CAD  . Coronary artery disease Father   . Valvular heart disease Mother   . Hypertension Mother     ALLERGIES:  is allergic to dilaudid and penicillins.  MEDICATIONS:  Current Outpatient Prescriptions  Medication Sig Dispense Refill  . albuterol (PROVENTIL HFA;VENTOLIN HFA) 108 (90 BASE) MCG/ACT inhaler Inhale 2 puffs into the lungs daily as needed for wheezing or shortness of breath.      . anastrozole (ARIMIDEX) 1 MG tablet Take 1 tablet (1 mg total) by mouth daily.  90 tablet  3  . Artificial Tear Solution (SOOTHE HYDRATION OP) Place 1 drop into both eyes daily as needed (dry eyes).      Marland Kitchen aspirin EC 81 MG tablet Take 81 mg by mouth daily with supper.      Marland Kitchen atorvastatin (LIPITOR) 40 MG tablet Take 40 mg by mouth at bedtime.       . hydrochlorothiazide (HYDRODIURIL) 25 MG tablet       .  HYDROcodone-acetaminophen (NORCO/VICODIN) 5-325 MG per tablet Take 1 tablet by mouth 2 (two) times daily.      Marland Kitchen HYDROcodone-acetaminophen (NORCO/VICODIN) 5-325 MG per tablet Take 1-2 tablets by mouth every 4 (four) hours as needed for moderate pain or severe pain.  40 tablet  0  . lisinopril (PRINIVIL,ZESTRIL) 40 MG tablet       . LORazepam (ATIVAN) 1 MG tablet Take 1 mg by mouth at bedtime.      . metoprolol (LOPRESSOR) 50 MG tablet Take 50 mg by mouth 2 (two) times daily.      . Multiple Vitamins-Minerals (PRESERVISION/LUTEIN PO) Take 1 tablet by mouth 2 (two) times daily.       . nitroGLYCERIN (NITROSTAT) 0.4 MG SL tablet Place 0.4 mg under the tongue every 5 (five) minutes as needed for chest pain.       . Vitamin D, Ergocalciferol, (DRISDOL) 50000 UNITS CAPS capsule Take 50,000 Units by mouth every 14 (fourteen) days.        No current facility-administered medications for this visit.    REVIEW OF SYSTEMS:   Constitutional: Denies fevers, chills or abnormal night sweats Eyes: Denies blurriness of vision, double vision or watery eyes Ears, nose, mouth, throat, and face: Denies mucositis or sore throat Respiratory: Denies cough, dyspnea or wheezes Cardiovascular: Denies palpitation, chest discomfort or lower extremity swelling Gastrointestinal:  Denies nausea, heartburn or change in bowel habits Skin: Denies abnormal skin rashes Lymphatics: Denies new lymphadenopathy or easy bruising Neurological:Denies numbness, tingling or new weaknesses Behavioral/Psych: Mood is stable, no new changes  Breast:  Denies any palpable lumps or discharge All other systems were reviewed with the patient and are negative.  PHYSICAL EXAMINATION: ECOG PERFORMANCE STATUS: 0 - Asymptomatic  Filed Vitals:   12/18/13 1212  BP: 150/75  Pulse: 112  Temp: 98.1 F (36.7 C)  Resp: 18   Filed Weights   12/18/13 1212  Weight: 190 lb 11.2 oz (86.501 kg)    GENERAL:alert, no distress and comfortable SKIN:  skin color, texture, turgor are normal, no rashes or significant lesions EYES: normal, conjunctiva are pink and non-injected, sclera clear OROPHARYNX:no exudate, no erythema and lips, buccal mucosa, and tongue normal  NECK: supple, thyroid normal size, non-tender, without nodularity LYMPH:  no palpable lymphadenopathy in the cervical, axillary or inguinal LUNGS: clear to auscultation and percussion with normal breathing effort HEART: regular rate & rhythm and no murmurs and no lower extremity edema ABDOMEN:abdomen soft, non-tender and normal bowel  sounds Musculoskeletal:no cyanosis of digits and no clubbing  PSYCH: alert & oriented x 3 with fluent speech NEURO: no focal motor/sensory deficits   LABORATORY DATA:  I have reviewed the data as listed Lab Results  Component Value Date   WBC 9.4 12/19/2012   HGB 13.3 11/13/2013   HCT 29.7* 12/19/2012   MCV 84.6 12/19/2012   PLT 175 12/19/2012   Lab Results  Component Value Date   NA 142 11/09/2013   K 4.1 11/09/2013   CL 100 11/09/2013   CO2 27 11/09/2013    RADIOGRAPHIC STUDIES: I have personally reviewed the radiological reports and agreed with the findings in the report.  ASSESSMENT AND PLAN:  Cancer of lower-inner quadrant of female breast Left breast invasive ductal carcinoma status post lumpectomy on 11/13/2013 ER/PR percent PR 98% gases 76% HER-2 negative ratio 1.65  Pathology counseling:Discussed with the patient, the details of pathology including the type of breast cancer,the staging, the significance of ER, PR and HER-2/neu receptors and the implications for treatment. Based on Oncotype DX recurrence score of 10, she is low risk of recurrence and hence does not require chemotherapy.  Recommendation: Patient should proceed to do adjuvant radiation therapy and follow that up with antiestrogen therapy with Arimidex.  Arimidex counseling:We discussed the risks and benefits of anti-estrogen therapy with aromatase inhibitors.  These include but not limited to insomnia, hot flashes, mood changes, vaginal dryness, bone density loss, and weight gain. Although rare, serious side effects including endometrial cancer, risk of blood clots were also discussed. We strongly believe that the benefits far outweigh the risks. Patient understands these risks and consented to starting treatment. Planned treatment duration is 5 years.  Patient will start Arimidex after finishing with radiation therapy. I gave her a prescription for the drug. She will return back in February for evaluation of tolerability and toxicities.    All questions were answered. The patient knows to call the clinic with any problems, questions or concerns. I spent 40 minutes counseling the patient face to face. The total time spent in the appointment was 60 minutes and more than 50% was on counseling.     Rulon Eisenmenger, MD 12/18/2013 1:40 PM

## 2013-12-18 NOTE — Telephone Encounter (Signed)
gv pt appt schedule for feb 2016 °

## 2013-12-23 ENCOUNTER — Ambulatory Visit
Admission: RE | Admit: 2013-12-23 | Discharge: 2013-12-23 | Disposition: A | Discharge status: Home / Self Care | Payer: Medicare Other | Source: Ambulatory Visit | Attending: Radiation Oncology | Admitting: Radiation Oncology

## 2013-12-23 ENCOUNTER — Encounter: Payer: Self-pay | Admitting: Radiation Oncology

## 2013-12-23 VITALS — BP 120/93 | HR 81 | Temp 98.2°F | Resp 20 | Ht 68.0 in | Wt 190.0 lb

## 2013-12-23 DIAGNOSIS — C50312 Malignant neoplasm of lower-inner quadrant of left female breast: Secondary | ICD-10-CM | POA: Diagnosis not present

## 2013-12-23 DIAGNOSIS — Z510 Encounter for antineoplastic radiation therapy: Secondary | ICD-10-CM | POA: Insufficient documentation

## 2013-12-23 NOTE — Progress Notes (Signed)
Please see the Nurse Progress Note in the MD Initial Consult Encounter for this patient. 

## 2013-12-23 NOTE — Progress Notes (Signed)
Radiation Oncology         [phone] ________________________________  Initial outpatient Consultation  Name: Rachel Chase MRN: [id number]  Date: 12/23/2013  DOB: 1940-04-21  RK:YHCWCBJ,SEGBTDV A, MD  Excell Seltzer, MD   REFERRING PHYSICIAN: Excell Seltzer, MD  DIAGNOSIS: Stage I invasive ductal carcinoma of the left breast, presenting in the lower inner quadrant (pT1C, pN0, Mx)  HISTORY OF PRESENT ILLNESS::Rachel Chase is a 73 y.o. female who is seen out of the courtesy of Dr. Excell Seltzer for an opinion concerning radiation therapy as part of management of patient's recently diagnosed stage I left breast cancer. Earlier this year on routine screening mammography the patient was found to have a suspicious area in the lower inner quadrant of the left breast. Patient underwent additional imaging  confirming the suspicious finding. A biopsy was performed which revealed invasive ductal carcinoma likely grade 2.  the tumor was estrogen receptor positive at 100%, progesterone receptor positive at 98%. Proliferation marker was low at 6%. There was no HER-2/neu amplification. Patient was seen by Dr. Excell Seltzer and was felt to be a good candidate for breast conservation therapy. Patient proceeded to undergo a lumpectomy and sentinel node procedure. Upon pathologic review the patient was found to have invasive ductal carcinoma measuring 1.4 cm. The surgical margins were clear with the closest margin being 0.5 cm. 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 One benign lymph node was removed from the axillary area. Patient did well the postoperative setting. She was seen by Dr. Lindi Adie. The Oncotype DX testing showed low risk for recurrence. It was recommended patient be considered for adjuvant hormonal therapy. The patient is now seen in radiation oncology for discussion of radiation therapy as part of breast conservation treatment.Marland Kitchen  PREVIOUS RADIATION THERAPY: No  PAST MEDICAL HISTORY:  has a  past medical history of Hypertension; Back pain; Shortness of breath; Arthritis; Anxiety; Rosacea (2008); Bone spur (2009); MI (myocardial infarction) (11/12); Coronary artery disease; Complication of anesthesia; and Hyperlipemia.    PAST SURGICAL HISTORY: Past Surgical History  Procedure Laterality Date  . Eye surgery      Laser Eye Surgery approx 2007  . Tubal ligation      bandaid surgery  . Tonsillectomy and adenoidectomy    . Hysteroscopy  9/10    D&C (polyp)  . Breast biopsy  8/10    fibrocystic changes  . Heart stent      failed-no stents  . Cataract extraction      both  . Breast lumpectomy with needle localization and axillary sentinel lymph node bx Left 11/13/2013    Procedure: NEEDLE LOCALIZATION LEFT BREAST LUMPECTOMY WITH LEFT  AXILLARY SENTINEL LYMPH NODE BX;  Surgeon: Excell Seltzer, MD;  Location: [facility];  Service: General;  Laterality: Left;    FAMILY HISTORY: family history includes Breast cancer in her maternal aunt and sister; Breast cancer (age of onset: 67) in her maternal grandmother; CVA in her father; Congestive Heart Failure in her father; Coronary artery disease in her father; Hypertension in her mother; Rectal cancer in her sister; Valvular heart disease in her mother.  SOCIAL HISTORY:  reports that she quit smoking about 15 years ago. Her smoking use included Cigarettes. She has a 45 pack-year smoking history. She has never used smokeless tobacco. She reports that she drinks alcohol. She reports that she does not use illicit drugs.  ALLERGIES: Dilaudid and Penicillins  MEDICATIONS:  Current Outpatient Prescriptions  Medication Sig Dispense Refill  . albuterol (PROVENTIL  HFA;VENTOLIN HFA) 108 (90 BASE) MCG/ACT inhaler Inhale 2 puffs into the lungs daily as needed for wheezing or shortness of breath.      . Artificial Tear Solution (SOOTHE HYDRATION OP) Place 1 drop into both eyes daily as needed (dry eyes).      Marland Kitchen aspirin EC 81 MG  tablet Take 81 mg by mouth daily with supper.      Marland Kitchen atorvastatin (LIPITOR) 40 MG tablet Take 40 mg by mouth at bedtime.       . hydrochlorothiazide (HYDRODIURIL) 25 MG tablet       . HYDROcodone-acetaminophen (NORCO/VICODIN) 5-325 MG per tablet Take 1 tablet by mouth 2 (two) times daily.      Marland Kitchen HYDROcodone-acetaminophen (NORCO/VICODIN) 5-325 MG per tablet Take 1-2 tablets by mouth every 4 (four) hours as needed for moderate pain or severe pain.  40 tablet  0  . lisinopril (PRINIVIL,ZESTRIL) 40 MG tablet       . LORazepam (ATIVAN) 1 MG tablet Take 1 mg by mouth at bedtime.      . metoprolol (LOPRESSOR) 50 MG tablet Take 50 mg by mouth 2 (two) times daily.      . nitroGLYCERIN (NITROSTAT) 0.4 MG SL tablet Place 0.4 mg under the tongue every 5 (five) minutes as needed for chest pain.       . Vitamin D, Ergocalciferol, (DRISDOL) 50000 UNITS CAPS capsule Take 50,000 Units by mouth every 14 (fourteen) days.       Marland Kitchen anastrozole (ARIMIDEX) 1 MG tablet Take 1 tablet (1 mg total) by mouth daily.  90 tablet  3  . Multiple Vitamins-Minerals (PRESERVISION/LUTEIN PO) Take 1 tablet by mouth 2 (two) times daily.        No current facility-administered medications for this encounter.    REVIEW OF SYSTEMS:  A 15 point review of systems is documented in the electronic medical record. This was obtained by the nursing staff. However, I reviewed this with the patient to discuss relevant findings and make appropriate changes. Patient denies any new bony pain headaches dizziness or blurred vision. She denies any pain within the breast area nipple discharge or bleeding prior to diagnosis. She denies any swelling in her left arm or hand.   PHYSICAL EXAM:  height is 5' 8" (1.727 m) and weight is 190 lb (86.183 kg). Her oral temperature is 98.2 F (36.8 C). Her blood pressure is 120/93 and her pulse is 81. Her respiration is 20 and oxygen saturation is 99%.   BP 120/93  Pulse 81  Temp(Src) 98.2 F (36.8 C) (Oral)  Resp  20  Ht 5' 8" (1.727 m)  Wt 190 lb (86.183 kg)  BMI 28.90 kg/m2  SpO2 99%  LMP 02/27/2008  General Appearance:    Alert, cooperative, no distress, appears stated age  Head:    Normocephalic, without obvious abnormality, atraumatic  Eyes:    PERRL, conjunctiva/corneas clear, EOM's intact,         Nose:   Nares normal, septum midline, mucosa normal, no drainage    or sinus tenderness  Throat:   Lips, mucosa, and tongue normal; teeth several crowns and gums normal  Neck:   Supple, symmetrical, trachea midline, no adenopathy;    thyroid:  no enlargement/tenderness/nodules; no carotid   bruit or JVD  Back:     Symmetric, no curvature, ROM normal, no CVA tenderness  Lungs:     Clear to auscultation bilaterally, respirations unlabored  Chest Wall:    No tenderness or deformity  Heart:    Regular rate and rhythm, S1 and S2 normal, no murmur, rub   or gallop  Breast Exam:    right breast No tenderness, masses, or nipple abnormality; left breast shows a well-healed scar in the lower-inner quadrant. A separate scar is noted in the axillary region. No dominant masses appreciated breast nipple discharge or bleeding.   Abdomen:     Soft, non-tender, bowel sounds active all four quadrants,    no masses, no organomegaly        Extremities:   Extremities normal, atraumatic, no cyanosis or edema  Pulses:   2+ and symmetric all extremities  Skin:   Skin color, texture, turgor normal, no rashes or lesions  Lymph nodes:   Cervical, supraclavicular, and axillary nodes normal  Neurologic:   normal strength, sensation and reflexes    throughout     ECOG = 0  0 - Asymptomatic (Fully active, able to carry on all predisease activities without restriction)  LABORATORY DATA:  Lab Results  Component Value Date   WBC 9.4 12/19/2012   HGB 13.3 11/13/2013   HCT 29.7* 12/19/2012   MCV 84.6 12/19/2012   PLT 175 12/19/2012   NEUTROABS 7.9* 01/29/2011   Lab Results  Component Value Date   NA 142  11/09/2013   K 4.1 11/09/2013   CL 100 11/09/2013   CO2 27 11/09/2013   GLUCOSE 100* 11/09/2013   CREATININE 0.78 11/09/2013   CALCIUM 9.3 11/09/2013      RADIOGRAPHY: No results found.    IMPRESSION: Stage I invasive ductal carcinoma of the left breast, presenting in the lower inner quadrant (pT1C, pN0, Mx).  The patient would be a good candidate for breast conservation therapy. Given her age and low risk for local recurrence one could consider avoiding radiation therapy in this situation as long as the patient proceeds with adjuvant hormonal therapy. I discussed this in detail with the patient. At this time the patient wishes to be aggressive with her treatment and wishes to proceed with adjuvant hormonal therapy as well as radiation therapy directed at the left breast as breast conservation treatment. Adding radiation therapy to her treatment seems very reasonable given her relatively young age and excellent performance status.    PLAN: Simulation and planning in the near future. Patient may be treated with hypo-fractionated accelerated radiation therapy depending on her anatomic set up at the time of simulation.     ------------------------------------------------  Blair Promise, PhD, MD

## 2013-12-28 ENCOUNTER — Encounter: Payer: Self-pay | Admitting: Radiation Oncology

## 2013-12-29 ENCOUNTER — Ambulatory Visit
Admission: RE | Admit: 2013-12-29 | Discharge: 2013-12-29 | Disposition: A | Discharge status: Home / Self Care | Payer: Medicare Other | Source: Ambulatory Visit | Attending: Radiation Oncology | Admitting: Radiation Oncology

## 2013-12-29 DIAGNOSIS — Z510 Encounter for antineoplastic radiation therapy: Secondary | ICD-10-CM | POA: Diagnosis not present

## 2013-12-29 DIAGNOSIS — C50312 Malignant neoplasm of lower-inner quadrant of left female breast: Secondary | ICD-10-CM

## 2013-12-29 NOTE — Progress Notes (Signed)
  Radiation Oncology         [phone] ________________________________  Name: VARSHA KNOCK MRN: [id number]  Date: 12/29/2013  DOB: [birth year]  Optical Surface Tracking Plan:  Since intensity modulated radiotherapy (IMRT) and 3D conformal radiation treatment methods are predicated on accurate and precise positioning for treatment, intrafraction motion monitoring is medically necessary to ensure accurate and safe treatment delivery.  The ability to quantify intrafraction motion without excessive ionizing radiation dose can only be performed with optical surface tracking. Accordingly, surface imaging offers the opportunity to obtain 3D measurements of patient position throughout IMRT and 3D treatments without excessive radiation exposure.  I am ordering optical surface tracking for this patient's upcoming course of radiotherapy. ________________________________  Blair Promise, MD 12/29/2013 6:48 PM    Reference:   Ursula Alert, J, et al. Surface imaging-based analysis of intrafraction motion for breast radiotherapy patients.Journal of [place], n. 6, nov. 2014. ISSN [id number].   Available at: <http://www.jacmp.org/index.php/jacmp/article/view/4957>.

## 2013-12-29 NOTE — Progress Notes (Signed)
  Radiation Oncology         [phone] ________________________________  Name: Rachel Chase MRN: [id number]  Date: 12/29/2013  DOB: 10-05-1940  SIMULATION AND TREATMENT PLANNING NOTE    ICD-9-CM ICD-10-CM   1. Cancer of lower-inner quadrant of female breast, left 174.3 C50.312     DIAGNOSIS:   Stage I invasive ductal carcinoma of the left breast, presenting in the lower inner quadrant (pT1C, pN0, Mx)  NARRATIVE:  The patient was brought to the [facility].  Identity was confirmed.  All relevant records and images related to the planned course of therapy were reviewed.  The patient freely provided informed written consent to proceed with treatment after reviewing the details related to the planned course of therapy. The consent form was witnessed and verified by the simulation staff.  Then, the patient was set-up in a stable reproducible  supine position for radiation therapy.  CT images were obtained.  Surface markings were placed.  The CT images were loaded into the planning software.  Then the target and avoidance structures were contoured.  Treatment planning then occurred.  The radiation prescription was entered and confirmed.  Then, I designed and supervised the construction of a total of 1 medically necessary complex treatment devices.  I have requested : 3D Simulation  I have requested a DVH of the following structures: heart, lungs, lumpectomy cavity.  I have ordered:dose calc.  PLAN:  The patient will receive 42.72 Gy in 16 fractions followed by a boost to the lumpectomy cavity of 10 gray  ________________________________  -----------------------------------  Blair Promise, PhD, MD

## 2013-12-30 ENCOUNTER — Encounter: Payer: Self-pay | Admitting: Hematology and Oncology

## 2013-12-30 NOTE — Progress Notes (Signed)
New patient intake form sent to scan.   

## 2013-12-30 NOTE — Addendum Note (Signed)
Addended by: Prentiss Bells on: 12/30/2013 03:05 PM   Modules accepted: Medications

## 2014-01-01 DIAGNOSIS — Z510 Encounter for antineoplastic radiation therapy: Secondary | ICD-10-CM | POA: Diagnosis not present

## 2014-01-05 ENCOUNTER — Ambulatory Visit
Admission: RE | Admit: 2014-01-05 | Discharge: 2014-01-05 | Disposition: A | Discharge status: Home / Self Care | Payer: Medicare Other | Source: Ambulatory Visit | Attending: Radiation Oncology | Admitting: Radiation Oncology

## 2014-01-05 DIAGNOSIS — Z510 Encounter for antineoplastic radiation therapy: Secondary | ICD-10-CM | POA: Diagnosis not present

## 2014-01-06 ENCOUNTER — Ambulatory Visit: Payer: Medicare Other

## 2014-01-06 ENCOUNTER — Ambulatory Visit
Admission: RE | Admit: 2014-01-06 | Discharge: 2014-01-06 | Disposition: A | Discharge status: Home / Self Care | Payer: Medicare Other | Source: Ambulatory Visit | Attending: Radiation Oncology | Admitting: Radiation Oncology

## 2014-01-06 ENCOUNTER — Encounter: Payer: Self-pay | Admitting: Radiation Oncology

## 2014-01-06 VITALS — BP 120/79 | HR 79 | Temp 97.5°F | Resp 20 | Wt 191.9 lb

## 2014-01-06 DIAGNOSIS — Z510 Encounter for antineoplastic radiation therapy: Secondary | ICD-10-CM | POA: Insufficient documentation

## 2014-01-06 DIAGNOSIS — C50312 Malignant neoplasm of lower-inner quadrant of left female breast: Secondary | ICD-10-CM | POA: Diagnosis not present

## 2014-01-06 MED ORDER — ALRA NON-METALLIC DEODORANT (RAD-ONC)
1.0000 "application " | Freq: Once | TOPICAL | Status: AC
  Administered 2014-01-06: 1 via TOPICAL

## 2014-01-06 MED ORDER — RADIAPLEXRX EX GEL
Freq: Once | CUTANEOUS | Status: AC
  Administered 2014-01-06: 15:00:00 via TOPICAL

## 2014-01-06 NOTE — Progress Notes (Signed)
  Radiation Oncology         [phone] ________________________________  Name: Rachel Chase MRN: [id number]  Date: 01/05/14  DOB: [birth year]  Simulation Verification Note    ICD-9-CM ICD-10-CM   1. Cancer of lower-inner quadrant of female breast, left 174.3 C50.312     Status: outpatient  NARRATIVE: The patient was brought to the treatment unit and placed in the planned treatment position. The clinical setup was verified. Then port films were obtained and uploaded to the radiation oncology medical record software.  The treatment beams were carefully compared against the planned radiation fields. The position location and shape of the radiation fields was reviewed. They targeted volume of tissue appears to be appropriately covered by the radiation beams. Organs at risk appear to be excluded as planned.  Based on my personal review, I approved the simulation verification. The patient's treatment will proceed as planned.  -----------------------------------  Blair Promise, PhD, MD

## 2014-01-06 NOTE — Progress Notes (Signed)
Weekly rad txs 1st tx,  Left breast,pt education done, rad book, Santiago Glad Hess,RN business csrd, alra, and radiaplex gel, flyer on skin products given, discussed skin irritation, fatigue, pain,, increase protein in diet, stay hydrated, no products on skin 4 hours prior to rad txs, teach back given, all questions answered, no c/o pain,appetite, energy is good 3:12 PM

## 2014-01-06 NOTE — Progress Notes (Signed)
  Radiation Oncology         [phone] ________________________________  Name: Rachel Chase MRN: [id number]  Date: 01/06/2014  DOB: Sep 02, 1940  Weekly Radiation Therapy Management    ICD-9-CM ICD-10-CM   1. Cancer of lower-inner quadrant of female breast, left 174.3 C50.312       Current Dose: 2.67 Gy     Planned Dose:  52.72 Gy  Narrative . . . . . . . . The patient presents for routine under treatment assessment.                                   The patient is without complaint.                                 Set-up films were reviewed.                                 The chart was checked. Physical Findings. . .  weight is 191 lb 14.4 oz (87.045 kg). Her oral temperature is 97.5 F (36.4 C). Her blood pressure is 120/79 and her pulse is 79. Her respiration is 20. . Weight essentially stable.  The left breast shows no signs of infection or radiation reaction. Impression . . . . . . . The patient is tolerating radiation. Plan . . . . . . . . . . . . Continue treatment as planned.  ________________________________   Blair Promise, PhD, MD

## 2014-01-07 ENCOUNTER — Ambulatory Visit
Admission: RE | Admit: 2014-01-07 | Discharge: 2014-01-07 | Disposition: A | Discharge status: Home / Self Care | Payer: Medicare Other | Source: Ambulatory Visit | Attending: Radiation Oncology | Admitting: Radiation Oncology

## 2014-01-07 ENCOUNTER — Ambulatory Visit: Payer: Medicare Other

## 2014-01-07 DIAGNOSIS — Z510 Encounter for antineoplastic radiation therapy: Secondary | ICD-10-CM | POA: Diagnosis not present

## 2014-01-08 ENCOUNTER — Ambulatory Visit: Payer: Medicare Other

## 2014-01-08 ENCOUNTER — Ambulatory Visit
Admission: RE | Admit: 2014-01-08 | Discharge: 2014-01-08 | Disposition: A | Discharge status: Home / Self Care | Payer: Medicare Other | Source: Ambulatory Visit | Attending: Radiation Oncology | Admitting: Radiation Oncology

## 2014-01-08 DIAGNOSIS — Z510 Encounter for antineoplastic radiation therapy: Secondary | ICD-10-CM | POA: Diagnosis not present

## 2014-01-11 ENCOUNTER — Ambulatory Visit: Payer: Medicare Other

## 2014-01-11 ENCOUNTER — Ambulatory Visit
Admission: RE | Admit: 2014-01-11 | Discharge: 2014-01-11 | Disposition: A | Discharge status: Home / Self Care | Payer: Medicare Other | Source: Ambulatory Visit | Attending: Radiation Oncology | Admitting: Radiation Oncology

## 2014-01-11 DIAGNOSIS — Z510 Encounter for antineoplastic radiation therapy: Secondary | ICD-10-CM | POA: Diagnosis not present

## 2014-01-12 ENCOUNTER — Ambulatory Visit
Admission: RE | Admit: 2014-01-12 | Discharge: 2014-01-12 | Disposition: A | Discharge status: Home / Self Care | Payer: Medicare Other | Source: Ambulatory Visit | Attending: Radiation Oncology | Admitting: Radiation Oncology

## 2014-01-12 ENCOUNTER — Encounter: Payer: Self-pay | Admitting: Radiation Oncology

## 2014-01-12 ENCOUNTER — Ambulatory Visit: Payer: Medicare Other

## 2014-01-12 VITALS — BP 129/86 | HR 74 | Temp 98.0°F | Resp 20 | Wt 190.1 lb

## 2014-01-12 DIAGNOSIS — Z510 Encounter for antineoplastic radiation therapy: Secondary | ICD-10-CM | POA: Diagnosis not present

## 2014-01-12 DIAGNOSIS — C50312 Malignant neoplasm of lower-inner quadrant of left female breast: Secondary | ICD-10-CM

## 2014-01-12 NOTE — Progress Notes (Signed)
Weekly rad txs, left breast 5 completed, mild pink color on left breast, skin intacty, using radiaplex bid, no c/o pain 2:28 PM

## 2014-01-12 NOTE — Progress Notes (Signed)
  Radiation Oncology         [phone] ________________________________  Name: Rachel Chase MRN: [id number]  Date: 01/12/2014  DOB: 18-Aug-1940  Weekly Radiation Therapy Management    ICD-9-CM ICD-10-CM   1. Cancer of lower-inner quadrant of female breast, left 174.3 C50.312     Current Dose: 13.35 Gy     Planned Dose:  52.72 Gy  Narrative . . . . . . . . The patient presents for routine under treatment assessment.                                   The patient is without complaint.                                 Set-up films were reviewed.                                 The chart was checked. Physical Findings. . .  weight is 190 lb 1.6 oz (86.229 kg). Her oral temperature is 98 F (36.7 C). Her blood pressure is 129/86 and her pulse is 74. Her respiration is 20. . Weight essentially stable.  Mild erythema in the left breast area Impression . . . . . . . The patient is tolerating radiation. Plan . . . . . . . . . . . . Continue treatment as planned.  ________________________________   Blair Promise, PhD, MD

## 2014-01-13 ENCOUNTER — Ambulatory Visit
Admission: RE | Admit: 2014-01-13 | Discharge: 2014-01-13 | Disposition: A | Discharge status: Home / Self Care | Payer: Medicare Other | Source: Ambulatory Visit | Attending: Radiation Oncology | Admitting: Radiation Oncology

## 2014-01-13 ENCOUNTER — Ambulatory Visit: Payer: Medicare Other

## 2014-01-13 DIAGNOSIS — Z510 Encounter for antineoplastic radiation therapy: Secondary | ICD-10-CM | POA: Diagnosis not present

## 2014-01-14 ENCOUNTER — Ambulatory Visit: Payer: Medicare Other

## 2014-01-14 ENCOUNTER — Ambulatory Visit
Admission: RE | Admit: 2014-01-14 | Discharge: 2014-01-14 | Disposition: A | Discharge status: Home / Self Care | Payer: Medicare Other | Source: Ambulatory Visit | Attending: Radiation Oncology | Admitting: Radiation Oncology

## 2014-01-14 DIAGNOSIS — Z510 Encounter for antineoplastic radiation therapy: Secondary | ICD-10-CM | POA: Diagnosis not present

## 2014-01-15 ENCOUNTER — Ambulatory Visit
Admission: RE | Admit: 2014-01-15 | Discharge: 2014-01-15 | Disposition: A | Discharge status: Home / Self Care | Payer: Medicare Other | Source: Ambulatory Visit | Attending: Radiation Oncology | Admitting: Radiation Oncology

## 2014-01-15 ENCOUNTER — Ambulatory Visit: Payer: Medicare Other

## 2014-01-15 ENCOUNTER — Encounter: Payer: Self-pay | Admitting: Radiation Oncology

## 2014-01-15 DIAGNOSIS — Z510 Encounter for antineoplastic radiation therapy: Secondary | ICD-10-CM | POA: Diagnosis not present

## 2014-01-15 NOTE — Progress Notes (Signed)
   Department of Radiation Oncology  Phone:  [phone] Fax:        [phone]  Electron beam simulation note  Today the patient underwent additional planning for radiation therapy directed at the left breast. Patient's treatment planning CT scan was reviewed and she had set up of a custom electron cutout directed at the lumpectomy cavity within the lower inner aspect of the left breast.   the patient will be treated with 12 megavoltage electrons. An electron Monte Carlo plan was generated for treatment. The patient will receive 5 additional treatments at 2 gray per fraction for a boost dose of 10 gray.  Blair Promise M.D.

## 2014-01-17 ENCOUNTER — Ambulatory Visit
Admission: RE | Admit: 2014-01-17 | Discharge: 2014-01-17 | Disposition: A | Discharge status: Home / Self Care | Payer: Medicare Other | Source: Ambulatory Visit | Attending: Radiation Oncology | Admitting: Radiation Oncology

## 2014-01-17 ENCOUNTER — Other Ambulatory Visit: Payer: Self-pay | Admitting: Radiation Oncology

## 2014-01-17 DIAGNOSIS — C50312 Malignant neoplasm of lower-inner quadrant of left female breast: Secondary | ICD-10-CM

## 2014-01-17 DIAGNOSIS — Z510 Encounter for antineoplastic radiation therapy: Secondary | ICD-10-CM | POA: Diagnosis not present

## 2014-01-17 NOTE — Progress Notes (Signed)
Weekly Management Note:  Site: Left breast Current Dose:  2403  cGy Projected Dose: 4540  \cGy followed by left breast boost of 1000 cGy in 5 sessions  Narrative: The patient is seen today for routine under treatment assessment. CBCT/MVCT images/port films were reviewed. The chart was reviewed.   She is without complaints today.  She uses Radioplex gel.  Physical Examination: There were no vitals filed for this visit..  Weight:  .  There is faint erythema the skin along the left breast with no areas of desquamation.  Impression: Tolerating radiation therapy well.  Plan: Continue radiation therapy as planned.

## 2014-01-17 NOTE — Progress Notes (Signed)
Weekly rd txs 9/16 left breast completed, slight pink on breast, skin intact, using radiaplex bid, no c/o pain, doing well 9:59 AM

## 2014-01-18 ENCOUNTER — Ambulatory Visit
Admission: RE | Admit: 2014-01-18 | Discharge: 2014-01-18 | Disposition: A | Discharge status: Home / Self Care | Payer: Medicare Other | Source: Ambulatory Visit | Attending: Radiation Oncology | Admitting: Radiation Oncology

## 2014-01-18 ENCOUNTER — Ambulatory Visit: Payer: Medicare Other

## 2014-01-18 DIAGNOSIS — Z510 Encounter for antineoplastic radiation therapy: Secondary | ICD-10-CM | POA: Diagnosis not present

## 2014-01-19 ENCOUNTER — Encounter: Payer: Self-pay | Admitting: Radiation Oncology

## 2014-01-19 ENCOUNTER — Ambulatory Visit
Admission: RE | Admit: 2014-01-19 | Discharge: 2014-01-19 | Disposition: A | Discharge status: Home / Self Care | Payer: Medicare Other | Source: Ambulatory Visit | Attending: Radiation Oncology | Admitting: Radiation Oncology

## 2014-01-19 ENCOUNTER — Ambulatory Visit: Payer: Medicare Other

## 2014-01-19 VITALS — BP 113/60 | HR 79 | Temp 98.0°F | Resp 20 | Ht 68.0 in | Wt 189.8 lb

## 2014-01-19 DIAGNOSIS — C50312 Malignant neoplasm of lower-inner quadrant of left female breast: Secondary | ICD-10-CM

## 2014-01-19 DIAGNOSIS — Z510 Encounter for antineoplastic radiation therapy: Secondary | ICD-10-CM | POA: Diagnosis not present

## 2014-01-19 NOTE — Progress Notes (Signed)
Rachel Chase has completed 11 fractions to her left breast.  She denies pain except for occasional sharp pains in her left breast.  She reports fatigue. The skin on her left breast is pink.  She is using radiaplex gel twice a day.

## 2014-01-19 NOTE — Progress Notes (Signed)
Weekly Management Note Current Dose: 29.37  Gy  Projected Dose: 52.72 Gy   Narrative:  The patient presents for routine under treatment assessment.  CBCT/MVCT images/Port film x-rays were reviewed.  The chart was checked. Doing well. Occasional sharp pains and some mild pink skin. Fatigue is manageable.   Physical Findings: Weight: 189 lb 12.8 oz (86.093 kg). Pink skin over left breast. No desquamation  Impression:  The patient is tolerating radiation.  Plan:  Continue treatment as planned. Continue radiaplex.

## 2014-01-20 ENCOUNTER — Ambulatory Visit: Payer: Medicare Other

## 2014-01-20 ENCOUNTER — Ambulatory Visit
Admission: RE | Admit: 2014-01-20 | Discharge: 2014-01-20 | Disposition: A | Discharge status: Home / Self Care | Payer: Medicare Other | Source: Ambulatory Visit | Attending: Radiation Oncology | Admitting: Radiation Oncology

## 2014-01-20 DIAGNOSIS — Z510 Encounter for antineoplastic radiation therapy: Secondary | ICD-10-CM | POA: Diagnosis not present

## 2014-01-22 ENCOUNTER — Ambulatory Visit: Payer: Medicare Other

## 2014-01-25 ENCOUNTER — Ambulatory Visit
Admission: RE | Admit: 2014-01-25 | Discharge: 2014-01-25 | Disposition: A | Discharge status: Home / Self Care | Payer: Medicare Other | Source: Ambulatory Visit | Attending: Radiation Oncology | Admitting: Radiation Oncology

## 2014-01-25 ENCOUNTER — Ambulatory Visit (INDEPENDENT_AMBULATORY_CARE_PROVIDER_SITE_OTHER): Payer: Medicare Other

## 2014-01-25 ENCOUNTER — Ambulatory Visit: Payer: Medicare Other

## 2014-01-25 ENCOUNTER — Ambulatory Visit (INDEPENDENT_AMBULATORY_CARE_PROVIDER_SITE_OTHER): Payer: Medicare Other | Admitting: Internal Medicine

## 2014-01-25 VITALS — BP 116/68 | HR 88 | Temp 98.5°F | Resp 18 | Ht 66.75 in | Wt 186.2 lb

## 2014-01-25 DIAGNOSIS — R509 Fever, unspecified: Secondary | ICD-10-CM

## 2014-01-25 DIAGNOSIS — R05 Cough: Secondary | ICD-10-CM

## 2014-01-25 DIAGNOSIS — J4520 Mild intermittent asthma, uncomplicated: Secondary | ICD-10-CM

## 2014-01-25 DIAGNOSIS — C50912 Malignant neoplasm of unspecified site of left female breast: Secondary | ICD-10-CM

## 2014-01-25 DIAGNOSIS — R059 Cough, unspecified: Secondary | ICD-10-CM

## 2014-01-25 DIAGNOSIS — Z510 Encounter for antineoplastic radiation therapy: Secondary | ICD-10-CM | POA: Diagnosis not present

## 2014-01-25 LAB — POCT CBC
Granulocyte percent: 67.2 %G (ref 37–80)
HCT, POC: 45.6 % (ref 37.7–47.9)
HEMOGLOBIN: 14.7 g/dL (ref 12.2–16.2)
LYMPH, POC: 2.5 (ref 0.6–3.4)
MCH: 30.4 pg (ref 27–31.2)
MCHC: 32.4 g/dL (ref 31.8–35.4)
MCV: 94 fL (ref 80–97)
MID (cbc): 0.4 (ref 0–0.9)
MPV: 6.8 fL (ref 0–99.8)
POC Granulocyte: 5.9 (ref 2–6.9)
POC LYMPH PERCENT: 28.2 %L (ref 10–50)
POC MID %: 4.6 %M (ref 0–12)
Platelet Count, POC: 299 10*3/uL (ref 142–424)
RBC: 4.85 M/uL (ref 4.04–5.48)
RDW, POC: 14.7 %
WBC: 8.8 10*3/uL (ref 4.6–10.2)

## 2014-01-25 LAB — POCT URINALYSIS DIPSTICK
BILIRUBIN UA: NEGATIVE
Glucose, UA: NEGATIVE
Ketones, UA: 15
NITRITE UA: NEGATIVE
Spec Grav, UA: 1.025
Urobilinogen, UA: 1
pH, UA: 5

## 2014-01-25 LAB — POCT UA - MICROSCOPIC ONLY
CASTS, UR, LPF, POC: NEGATIVE
Crystals, Ur, HPF, POC: NEGATIVE
MUCUS UA: POSITIVE
Yeast, UA: NEGATIVE

## 2014-01-25 MED ORDER — PREDNISONE 20 MG PO TABS: ORAL_TABLET | ORAL | Status: DC

## 2014-01-25 MED ORDER — PREDNISOLONE 15 MG/5ML PO SOLN
30.0000 mg | Freq: Once | ORAL | Status: AC
  Administered 2014-01-25: 30 mg via ORAL

## 2014-01-25 MED ORDER — HYDROCODONE-HOMATROPINE 5-1.5 MG/5ML PO SYRP: 5.0000 mL | ORAL_SOLUTION | Freq: Four times a day (QID) | ORAL | Status: DC | PRN

## 2014-01-25 NOTE — Progress Notes (Signed)
Patient came to the clinic saying that she has had a dry cough for a month and on Tuesday started running fevers at night of 101.6.  She reports feeling hot and cold during the night.  She had been taking hycodan BID and Singulair.  Her vitals were taken with her temperature at 98.9 degrees, bp at 104/70, hr 93, respiratory rate of 20 and 97% oxygen saturation on room air.  Her lungs sound diminished.   Advised her to go to her primary care doctor and she agreed to go to [facility] on [address] [facility] on her way home.

## 2014-01-25 NOTE — Progress Notes (Addendum)
Subjective:  This chart was scribed for Tami Lin, MD by Molli Posey, Medical scribe. This patient was seen in ROOM 5 and the patient's care was started 4:36 PM.   Patient ID: Rachel Chase, female    DOB: [birth year], 73 y.o.   MRN: [id number]   Chief Complaint  Patient presents with  . URI    Started on 11/17, dry cough   1st [facility] visit HPI HPI Comments: Rachel Chase is a 73 y.o. female who presents to [facility] complaining of a cough for the past 3 weeks .Mainly unproductive cough, but she has had an intermittent fever around 7-8pm for the last 4 days with a maximum temperature of 101.6. She reports associated intermittent chills and sweats. Given hydromet and singulair by nurse at care facility at onset-was told she did not prescribe prednisone because pt is receiving radiation treatment for her breast cancer. She reports her last chest x-ray was October 2014. She denies urinary frequency, dysuria or abdominal pain as symptoms. No nasal congestion or sorethroat.  New dx Breast Ca with radiation therapy starting ~01/06/14  PCP Dr. Reynaldo Minium  She was a smoker and there is hx to suggest COPD at one point.  Patient Active Problem List   Diagnosis Date Noted  . Hyperlipemia 11/16/2013  . Cancer of lower-inner quadrant of female breast 10/21/2013  . Spinal stenosis of lumbar region L2-5 12/19/2012    Current Outpatient Prescriptions on File Prior to Visit  Medication Sig Dispense Refill  . albuterol (PROVENTIL HFA;VENTOLIN HFA) 108 (90 BASE) MCG/ACT inhaler Inhale 2 puffs into the lungs daily as needed for wheezing or shortness of breath.    . anastrozole (ARIMIDEX) 1 MG tablet Take 1 tablet (1 mg total) by mouth daily. 90 tablet 3  . Artificial Tear Solution (SOOTHE HYDRATION OP) Place 1 drop into both eyes daily as needed (dry eyes).    Marland Kitchen aspirin EC 81 MG tablet Take 81 mg by mouth daily with supper.    Marland Kitchen atorvastatin (LIPITOR) 40 MG tablet Take 40 mg by mouth at bedtime.       . hyaluronate sodium (RADIAPLEXRX) GEL Apply 1 application topically 2 (two) times daily. Add to affected skin area lt breast after rad txs and bedtime daily    . hydrochlorothiazide (HYDRODIURIL) 25 MG tablet Take 25 mg by mouth daily.     Marland Kitchen HYDROcodone-acetaminophen (NORCO/VICODIN) 5-325 MG per tablet Take 1 tablet by mouth 2 (two) times daily. 1/2 tab in am and 1/2 tab night    . HYDROcodone-homatropine (HYCODAN) 5-1.5 MG/5ML syrup Take 5 mLs by mouth 2 (two) times daily as needed for cough.    Marland Kitchen lisinopril (PRINIVIL,ZESTRIL) 40 MG tablet Take 40 mg by mouth daily.     Marland Kitchen LORazepam (ATIVAN) 1 MG tablet Take 1 mg by mouth at bedtime.    . metoprolol (LOPRESSOR) 50 MG tablet Take 50 mg by mouth 2 (two) times daily.    . montelukast (SINGULAIR) 10 MG tablet Take 10 mg by mouth at bedtime.    . Multiple Vitamins-Minerals (PRESERVISION/LUTEIN PO) Take 1 tablet by mouth 2 (two) times daily.     . nitroGLYCERIN (NITROSTAT) 0.4 MG SL tablet Place 0.4 mg under the tongue every 5 (five) minutes as needed for chest pain.     . non-metallic deodorant Jethro Poling) MISC Apply 1 application topically daily.    . Vitamin D, Ergocalciferol, (DRISDOL) 50000 UNITS CAPS capsule Take 50,000 Units by mouth every 14 (fourteen) days.  No current facility-administered medications on file prior to visit.   Past Medical History  Diagnosis Date  . Hypertension   . Back pain     Lower Back pain - takes Cortisone shots  . Shortness of breath   . Arthritis   . Anxiety   . Rosacea 2008    both eyes  . Bone spur 2009    lower back  . MI (myocardial infarction) 11/12    [facility]  . Coronary artery disease   . Complication of anesthesia     throat irrition from past intubations, intubation granulomas  . Hyperlipemia   . Breast cancer   . Cataract    Past Surgical History  Procedure Laterality Date  . Eye surgery      Laser Eye Surgery approx 2007  . Tubal ligation      bandaid surgery  . Tonsillectomy and  adenoidectomy    . Hysteroscopy  9/10    D&C (polyp)  . Breast biopsy  8/10    fibrocystic changes  . Heart stent      failed-no stents  . Cataract extraction      both  . Breast lumpectomy with needle localization and axillary sentinel lymph node bx Left 11/13/2013    Procedure: NEEDLE LOCALIZATION LEFT BREAST LUMPECTOMY WITH LEFT  AXILLARY SENTINEL LYMPH NODE BX;  Surgeon: Excell Seltzer, MD;  Location: [place] [facility];  Service: General;  Laterality: Left;  . Back surgery       Review of Systems  Constitutional: Positive for fever, chills and diaphoresis.  Respiratory: Positive for cough.   Gastrointestinal: Negative for abdominal pain.  Genitourinary: Negative for dysuria and frequency.       Objective:   Physical Exam  Constitutional: She is oriented to person, place, and time. She appears well-developed and well-nourished. No distress.  HENT:  Head: Normocephalic and atraumatic.  Right Ear: External ear normal.  Left Ear: External ear normal.  Nose: Nose normal.  Mouth/Throat: Oropharynx is clear and moist.  Eyes: Conjunctivae and EOM are normal. Pupils are equal, round, and reactive to light.  Neck: Neck supple. No thyromegaly present.  Cardiovascular: Normal rate, regular rhythm, normal heart sounds and intact distal pulses.   No murmur heard. Pulmonary/Chest: Effort normal.  Wheezing bilaterally with forced expiration. Delayed expiratory phase. Generally diminished breath sounds. No rhonchi or rales  Musculoskeletal: She exhibits no edema.  Lymphadenopathy:    She has no cervical adenopathy.  Neurological: She is alert and oriented to person, place, and time. No cranial nerve deficit.  Psychiatric: She has a normal mood and affect. Her behavior is normal.  Nursing note and vitals reviewed.  Filed Vitals:   01/25/14 1624  BP: 116/68  Pulse: 88  Temp: 98.5 F (36.9 C)  TempSrc: Oral  Resp: 18  Height: 5' 6.75" (1.695 m)  Weight: 186 lb 3.2 oz  (84.46 kg)  SpO2: 98%   [facility] reading (PRIMARY) by  Dr. Laney Pastor? Radiation pneumonitis changes on the L. (addendum:Radiology review disagreed)  Results for orders placed or performed in visit on 01/25/14  POCT CBC  Result Value Ref Range   WBC 8.8 4.6 - 10.2 K/uL   Lymph, poc 2.5 0.6 - 3.4   POC LYMPH PERCENT 28.2 10 - 50 %L   MID (cbc) 0.4 0 - 0.9   POC MID % 4.6 0 - 12 %M   POC Granulocyte 5.9 2 - 6.9   Granulocyte percent 67.2 37 - 80 %G  RBC 4.85 4.04 - 5.48 M/uL   Hemoglobin 14.7 12.2 - 16.2 g/dL   HCT, POC 45.6 37.7 - 47.9 %   MCV 94.0 80 - 97 fL   MCH, POC 30.4 27 - 31.2 pg   MCHC 32.4 31.8 - 35.4 g/dL   RDW, POC 14.7 %   Platelet Count, POC 299 142 - 424 K/uL   MPV 6.8 0 - 99.8 fL  POCT UA - Microscopic Only  Result Value Ref Range   WBC, Ur, HPF, POC 2-5    RBC, urine, microscopic 3-6    Bacteria, U Microscopic trace    Mucus, UA positive    Epithelial cells, urine per micros 0-4    Crystals, Ur, HPF, POC negative    Casts, Ur, LPF, POC negative    Yeast, UA negative   POCT urinalysis dipstick  Result Value Ref Range   Color, UA dark yellow    Clarity, UA slightly cloudy    Glucose, UA negative    Bilirubin, UA negatvie    Ketones, UA 15    Spec Grav, UA 1.025    Blood, UA small    pH, UA 5.0    Protein, UA trace    Urobilinogen, UA 1.0    Nitrite, UA negative    Leukocytes, UA small (1+)        Assessment & Plan:  Fever, unspecified fever cause --?now resolved  Cough -due to reactive airway disease/?radiation pneumonitis since onset was at time of radiation  Breast cancer, left  Plan: Meds ordered this encounter  Medications  . predniSONE (DELTASONE) 20 MG tablet    Sig: 3/3/3/3/2/2/2/2/1/1/1/1 single daily dose for 12 days    Dispense:  24 tablet    Refill:  0  . HYDROcodone-homatropine (HYCODAN) 5-1.5 MG/5ML syrup    Sig: Take 5 mLs by mouth every 6 (six) hours as needed for cough.    Dispense:  120 mL    Refill:  0  . prednisoLONE  (PRELONE) 15 MG/5ML SOLN 30 mg    Sig: given in office--pharmacy closed   Will forward to Rad Onc for consideration

## 2014-01-26 ENCOUNTER — Encounter: Payer: Self-pay | Admitting: Radiation Oncology

## 2014-01-26 ENCOUNTER — Ambulatory Visit: Payer: Medicare Other

## 2014-01-26 ENCOUNTER — Ambulatory Visit
Admission: RE | Admit: 2014-01-26 | Discharge: 2014-01-26 | Disposition: A | Discharge status: Home / Self Care | Payer: Medicare Other | Source: Ambulatory Visit | Attending: Radiation Oncology | Admitting: Radiation Oncology

## 2014-01-26 VITALS — BP 114/71 | HR 84 | Temp 98.5°F | Resp 16 | Ht 66.75 in | Wt 189.0 lb

## 2014-01-26 DIAGNOSIS — C50312 Malignant neoplasm of lower-inner quadrant of left female breast: Secondary | ICD-10-CM

## 2014-01-26 DIAGNOSIS — Z510 Encounter for antineoplastic radiation therapy: Secondary | ICD-10-CM | POA: Diagnosis not present

## 2014-01-26 NOTE — Progress Notes (Signed)
  Radiation Oncology         [phone] ________________________________  Name: Rachel Chase MRN: [id number]  Date: 01/26/2014  DOB: 1940/05/28  Weekly Radiation Therapy Management  Current Dose: 42.72 Gy     Planned Dose:  52.72 Gy  Narrative . . . . . . . . The patient presents for routine under treatment assessment.                                   The patient is without complaint for some itching and soreness within the breast area as well as mild fatigue. The patient presented with upper respiratory symptoms recently and was seen by urgent care. She was placed on steroids and a cough medicine. She is doing much better as far as this issue is concerned.                                 Set-up films were reviewed.                                 The chart was checked. Physical Findings. . .  height is 5' 6.75" (1.695 m) and weight is 189 lb (85.73 kg). Her oral temperature is 98.5 F (36.9 C). Her blood pressure is 114/71 and her pulse is 84. Her respiration is 16 and oxygen saturation is 95%. . On erythema and hyperpigmentation changes noted in the left breast. Impression . . . . . . . The patient is tolerating radiation. Plan . . . . . . . . . . . . Continue treatment as planned.  ________________________________   Blair Promise, PhD, MD

## 2014-01-26 NOTE — Progress Notes (Signed)
  Radiation Oncology         (336) [phone] ________________________________  Name: Rachel Chase MRN: [id number]  Date: 01/26/2014  DOB: [birth year]  Electron clinical setup check    Narrative . . . . . . . . Today the patient was taken to the treatment machine and placed in the treatment position. Her electron setup was reviewed showing accurate set up for treatment. ________________________________   Blair Promise, PhD, MD

## 2014-01-26 NOTE — Progress Notes (Signed)
Rachel Chase has completed 14 fractions to her left breast.  She denies pan.  She went to urgent care yesterday for a cough/fevers.  She had a chest x ray done and has started prednisone.  She is wondering if she shoud keep taking singulair.  She reports feeling a little better today.  The skin on her left breast is pink and she has noticed a scattered rash underneath her breast.  She is using radiaplex twice a day.

## 2014-01-27 ENCOUNTER — Ambulatory Visit: Payer: Medicare Other

## 2014-01-27 ENCOUNTER — Ambulatory Visit
Admission: RE | Admit: 2014-01-27 | Discharge: 2014-01-27 | Disposition: A | Discharge status: Home / Self Care | Payer: Medicare Other | Source: Ambulatory Visit | Attending: Radiation Oncology | Admitting: Radiation Oncology

## 2014-01-27 DIAGNOSIS — Z510 Encounter for antineoplastic radiation therapy: Secondary | ICD-10-CM | POA: Diagnosis not present

## 2014-01-28 ENCOUNTER — Ambulatory Visit: Payer: Medicare Other

## 2014-01-28 ENCOUNTER — Ambulatory Visit
Admission: RE | Admit: 2014-01-28 | Discharge: 2014-01-28 | Disposition: A | Discharge status: Home / Self Care | Payer: Medicare Other | Source: Ambulatory Visit | Attending: Radiation Oncology | Admitting: Radiation Oncology

## 2014-01-28 DIAGNOSIS — Z510 Encounter for antineoplastic radiation therapy: Secondary | ICD-10-CM | POA: Diagnosis not present

## 2014-01-29 ENCOUNTER — Ambulatory Visit
Admission: RE | Admit: 2014-01-29 | Discharge: 2014-01-29 | Disposition: A | Discharge status: Home / Self Care | Payer: Medicare Other | Source: Ambulatory Visit | Attending: Radiation Oncology | Admitting: Radiation Oncology

## 2014-01-29 ENCOUNTER — Ambulatory Visit: Payer: Medicare Other

## 2014-01-29 DIAGNOSIS — Z510 Encounter for antineoplastic radiation therapy: Secondary | ICD-10-CM | POA: Diagnosis not present

## 2014-02-01 ENCOUNTER — Ambulatory Visit
Admission: RE | Admit: 2014-02-01 | Discharge: 2014-02-01 | Disposition: A | Discharge status: Home / Self Care | Payer: Medicare Other | Source: Ambulatory Visit | Attending: Radiation Oncology | Admitting: Radiation Oncology

## 2014-02-01 ENCOUNTER — Ambulatory Visit: Payer: Medicare Other

## 2014-02-01 DIAGNOSIS — Z510 Encounter for antineoplastic radiation therapy: Secondary | ICD-10-CM | POA: Diagnosis not present

## 2014-02-02 ENCOUNTER — Ambulatory Visit
Admission: RE | Admit: 2014-02-02 | Discharge: 2014-02-02 | Disposition: A | Discharge status: Home / Self Care | Payer: Medicare Other | Source: Ambulatory Visit | Attending: Radiation Oncology | Admitting: Radiation Oncology

## 2014-02-02 ENCOUNTER — Ambulatory Visit: Payer: Medicare Other

## 2014-02-02 VITALS — BP 108/68 | HR 98 | Temp 98.1°F | Resp 20 | Ht 66.75 in | Wt 188.0 lb

## 2014-02-02 DIAGNOSIS — Z510 Encounter for antineoplastic radiation therapy: Secondary | ICD-10-CM | POA: Diagnosis not present

## 2014-02-02 DIAGNOSIS — C50312 Malignant neoplasm of lower-inner quadrant of left female breast: Secondary | ICD-10-CM

## 2014-02-02 NOTE — Progress Notes (Signed)
Rachel Chase has completed 19 fractions to her left breast.  She denies pain.  She reports feeling "wired" from taking prednisone.  She is down to 1 tablet per day tomorrow.  The skin on her left breast has a scattered, red rash.  She is using radiaplex.  She is wondering when to start her Arimidex.  She has been given a one month follow up card.

## 2014-02-02 NOTE — Progress Notes (Signed)
  Radiation Oncology         [phone] ________________________________  Name: Rachel Chase MRN: [id number]  Date: 02/02/2014  DOB: November 10, 1940  Weekly Radiation Therapy Management  DIAGNOSIS: Stage I invasive ductal carcinoma of the left breast, presenting in the lower inner quadrant (pT1C, pN0, Mx)  Current Dose: 48.72 Gy     Planned Dose:  52.72 Gy  Narrative . . . . . . . . The patient presents for routine under treatment assessment.                                   The patient is without complaint. She denies any itching or discomfort in the left breast. She is feeling better after being treated for her upper respiratory illness                                 Set-up films were reviewed.                                 The chart was checked. Physical Findings. . .  height is 5' 6.75" (1.695 m) and weight is 188 lb (85.276 kg). Her oral temperature is 98.1 F (36.7 C). Her blood pressure is 108/68 and her pulse is 98. Her respiration is 20. . The left breast area shows minimal radiation reaction. Impression . . . . . . . The patient is tolerating radiation. Plan . . . . . . . . . . . . Continue treatment as planned.  ________________________________   Blair Promise, PhD, MD

## 2014-02-03 ENCOUNTER — Encounter (HOSPITAL_COMMUNITY): Payer: Self-pay | Admitting: Cardiology

## 2014-02-03 ENCOUNTER — Ambulatory Visit: Payer: Medicare Other

## 2014-02-03 ENCOUNTER — Ambulatory Visit
Admission: RE | Admit: 2014-02-03 | Discharge: 2014-02-03 | Disposition: A | Discharge status: Home / Self Care | Payer: Medicare Other | Source: Ambulatory Visit | Attending: Radiation Oncology | Admitting: Radiation Oncology

## 2014-02-03 DIAGNOSIS — Z510 Encounter for antineoplastic radiation therapy: Secondary | ICD-10-CM | POA: Diagnosis not present

## 2014-02-04 ENCOUNTER — Ambulatory Visit: Payer: Medicare Other

## 2014-02-04 ENCOUNTER — Ambulatory Visit
Admission: RE | Admit: 2014-02-04 | Discharge: 2014-02-04 | Disposition: A | Discharge status: Home / Self Care | Payer: Medicare Other | Source: Ambulatory Visit | Attending: Radiation Oncology | Admitting: Radiation Oncology

## 2014-02-04 DIAGNOSIS — Z510 Encounter for antineoplastic radiation therapy: Secondary | ICD-10-CM | POA: Diagnosis not present

## 2014-02-05 ENCOUNTER — Ambulatory Visit: Payer: Medicare Other

## 2014-02-08 ENCOUNTER — Encounter: Payer: Self-pay | Admitting: Radiation Oncology

## 2014-02-08 ENCOUNTER — Ambulatory Visit: Payer: Medicare Other

## 2014-02-08 NOTE — Progress Notes (Signed)
  Radiation Oncology         [phone] ________________________________  Name: ALISSANDRA GEOFFROY MRN: [id number]  Date: 02/08/2014  DOB: Jul 19, 1940  End of Treatment Note  Diagnosis:  Stage I invasive ductal carcinoma of the left breast, presenting in the lower inner quadrant (pT1C, pN0, Mx)      Indication for treatment:  Breast conservation therapy       Radiation treatment dates:   November 11 through December 10  Site/dose:   Left breast 42.72 gray in 16 fractions; lumpectomy cavity boost 10 gray in 5 fractions, cumulative dose to the lumpectomy cavity 52.72 gray  Beams/energy:   Initial set up with tangential beams encompass in the left breast 6 MV photons; lumpectomy cavity boost with a custom electron cutout field, 12 MeV electrons  Narrative: The patient tolerated radiation treatment relatively well.   She experienced minimal itching and soreness within the breast and mild fatigue. No significant skin reaction  Plan: The patient has completed radiation treatment. The patient will return to radiation oncology clinic for routine followup in one month. I advised them to call or return sooner if they have any questions or concerns related to their recovery or treatment.  -----------------------------------  Blair Promise, PhD, MD

## 2014-02-09 ENCOUNTER — Ambulatory Visit: Payer: Medicare Other

## 2014-02-10 ENCOUNTER — Ambulatory Visit: Payer: Medicare Other

## 2014-02-11 ENCOUNTER — Telehealth: Payer: Self-pay

## 2014-02-11 ENCOUNTER — Ambulatory Visit: Payer: Medicare Other

## 2014-02-11 NOTE — Telephone Encounter (Signed)
CAlled pt to ensure she had started Arimidex after completino of radiation on 12/10.  Pt reports Dr. Sondra Come instructed her to wait a month prior to starting.  Per Dr. Lindi Adie, It is ok for pt to wait to start.  Pt voiced understanding.

## 2014-02-12 ENCOUNTER — Ambulatory Visit: Payer: Medicare Other

## 2014-02-15 ENCOUNTER — Ambulatory Visit: Payer: Medicare Other

## 2014-02-16 ENCOUNTER — Ambulatory Visit: Payer: Medicare Other

## 2014-02-17 ENCOUNTER — Ambulatory Visit: Payer: Medicare Other

## 2014-02-18 ENCOUNTER — Ambulatory Visit: Payer: Medicare Other

## 2014-02-22 ENCOUNTER — Ambulatory Visit: Payer: Medicare Other

## 2014-02-23 ENCOUNTER — Ambulatory Visit: Payer: Medicare Other

## 2014-02-24 ENCOUNTER — Ambulatory Visit: Payer: Medicare Other

## 2014-03-15 ENCOUNTER — Encounter: Payer: Self-pay | Admitting: Oncology

## 2014-03-15 ENCOUNTER — Ambulatory Visit
Admission: RE | Admit: 2014-03-15 | Discharge: 2014-03-15 | Disposition: A | Discharge status: Home / Self Care | Payer: Medicare Other | Source: Ambulatory Visit | Attending: Radiation Oncology | Admitting: Radiation Oncology

## 2014-03-15 ENCOUNTER — Telehealth: Payer: Self-pay | Admitting: Hematology and Oncology

## 2014-03-15 VITALS — BP 115/46 | HR 84 | Temp 97.6°F | Resp 24 | Ht 66.75 in | Wt 187.5 lb

## 2014-03-15 DIAGNOSIS — C50312 Malignant neoplasm of lower-inner quadrant of left female breast: Secondary | ICD-10-CM

## 2014-03-15 NOTE — Progress Notes (Signed)
Radiation Oncology         (336) [phone] ________________________________  Name: Rachel Chase MRN: [id number]  Date: 03/15/2014  DOB: 1940-10-02  Follow-Up Visit Note  CC: Geoffery Lyons, MD  Excell Seltzer, MD    ICD-9-CM ICD-10-CM   1. Cancer of lower-inner quadrant of female breast, left 174.3 C50.312     Diagnosis:   Stage I invasive ductal carcinoma of the left breast  Interval Since Last Radiation:  1  months  Narrative:  The patient returns today for routine follow-up.  She is doing well this time. She denies any fatigue. She occasionally will have  mild discomfort within the left breast. She denies any itching or nipple discharge or bleeding. Patient has started Arimidex and is tolerating this well thus far. She denies any problems with swelling in her left arm or hand.                              ALLERGIES:  is allergic to dilaudid and penicillins.  Meds: Current Outpatient Prescriptions  Medication Sig Dispense Refill  . albuterol (PROVENTIL HFA;VENTOLIN HFA) 108 (90 BASE) MCG/ACT inhaler Inhale 2 puffs into the lungs daily as needed for wheezing or shortness of breath.    . anastrozole (ARIMIDEX) 1 MG tablet Take 1 tablet (1 mg total) by mouth daily. 90 tablet 3  . Artificial Tear Solution (SOOTHE HYDRATION OP) Place 1 drop into both eyes daily as needed (dry eyes).    Marland Kitchen aspirin EC 81 MG tablet Take 81 mg by mouth daily with supper.    Marland Kitchen atorvastatin (LIPITOR) 40 MG tablet Take 40 mg by mouth at bedtime.     . hyaluronate sodium (RADIAPLEXRX) GEL Apply 1 application topically 2 (two) times daily. Add to affected skin area lt breast after rad txs and bedtime daily    . hydrochlorothiazide (HYDRODIURIL) 25 MG tablet Take 25 mg by mouth daily.     Marland Kitchen HYDROcodone-acetaminophen (NORCO/VICODIN) 5-325 MG per tablet Take 1 tablet by mouth 2 (two) times daily. 1/2 tab in am and 1/2 tab night    . lisinopril (PRINIVIL,ZESTRIL) 40 MG tablet Take 40 mg by mouth daily.     Marland Kitchen  LORazepam (ATIVAN) 1 MG tablet Take 1 mg by mouth at bedtime.    . metoprolol (LOPRESSOR) 50 MG tablet Take 50 mg by mouth 2 (two) times daily.    . Multiple Vitamins-Minerals (PRESERVISION/LUTEIN PO) Take 1 tablet by mouth 2 (two) times daily.     . Vitamin D, Ergocalciferol, (DRISDOL) 50000 UNITS CAPS capsule Take 50,000 Units by mouth every 14 (fourteen) days.     . montelukast (SINGULAIR) 10 MG tablet Take 10 mg by mouth at bedtime.    . nitroGLYCERIN (NITROSTAT) 0.4 MG SL tablet Place 0.4 mg under the tongue every 5 (five) minutes as needed for chest pain.     . non-metallic deodorant Jethro Poling) MISC Apply 1 application topically daily.     No current facility-administered medications for this encounter.    Physical Findings: The patient is in no acute distress. Patient is alert and oriented.  height is 5' 6.75" (1.695 m) and weight is 187 lb 8 oz (85.049 kg). Her oral temperature is 97.6 F (36.4 C). Her blood pressure is 115/46 and her pulse is 84. Her respiration is 24. Marland Kitchen  No palpable subclavicular or axillary adenopathy. The lungs are clear to auscultation. The heart has a regular rhythm and rate. Examination  of the right breast reveals no mass or nipple discharge. Examination of left breast reveals the patient's skin to be well-healed. There is some mild edema in the breast and mild hyperpigmentation changes. No dominant masses appreciated in the breast nipple discharge or bleeding.  Lab Findings: Lab Results  Component Value Date   WBC 8.8 01/25/2014   HGB 14.7 01/25/2014   HCT 45.6 01/25/2014   MCV 94.0 01/25/2014   PLT 175 12/19/2012    Radiographic Findings: No results found.  Impression:  The patient is recovering from the effects of radiation.  No evidence of recurrence on clinical exam today  Plan:  Routine follow-up in 6 months. In the interim the patient will be seen by medical oncology.  ____________________________________ Blair Promise, MD

## 2014-03-15 NOTE — Progress Notes (Signed)
Rachel Chase here for follow up after treatment to her left breast.  She denies pain.  She has occasional discomfort when she rolls on her left side at night.  She started taking Arimidex on January 1.  She denies fatigue.  The skin on her left breast has hyperpigmentation.  She is using radiaplex gel.  BP 115/46 mmHg  Pulse 84  Temp(Src) 97.6 F (36.4 C) (Oral)  Resp 24  Ht 5' 6.75" (1.695 m)  Wt 187 lb 8 oz (85.049 kg)  BMI 29.60 kg/m2  LMP 02/27/2008

## 2014-03-15 NOTE — Telephone Encounter (Signed)
lvm for pt regarding to Feb appt time change due to MD on call....mailed pt appt sched and letter

## 2014-03-30 ENCOUNTER — Ambulatory Visit: Payer: Medicare Other | Admitting: Hematology and Oncology

## 2014-03-30 ENCOUNTER — Telehealth: Payer: Self-pay | Admitting: Hematology and Oncology

## 2014-03-30 ENCOUNTER — Ambulatory Visit (HOSPITAL_BASED_OUTPATIENT_CLINIC_OR_DEPARTMENT_OTHER): Payer: Medicare Other | Admitting: Hematology and Oncology

## 2014-03-30 VITALS — BP 132/64 | HR 79 | Temp 98.4°F | Resp 18 | Ht 66.75 in | Wt 187.7 lb

## 2014-03-30 DIAGNOSIS — C50312 Malignant neoplasm of lower-inner quadrant of left female breast: Secondary | ICD-10-CM

## 2014-03-30 NOTE — Progress Notes (Signed)
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 N0 M0 stage IA ER 100% PR 98% Ki-67 6% HER-2 negative ratio 1.56   01/04/2014 - 02/05/2014 Radiation Therapy Left breast 42.72 gray in 16 fractions; lumpectomy cavity boost 10 gray in 5 fractions, cumulative dose to the lumpectomy cavity 52.72 gray   02/26/2014 -  Anti-estrogen oral therapy Anastrazole 1 mg daily    CHIEF COMPLIANT: Follow-up on anastrozole  INTERVAL HISTORY: Rachel Chase is a 74 year old lady with above-mentioned history left-sided breast cancer treated with lumpectomy and radiation and is currently on anastrozole 1 mg daily since January 2016. She is tolerating anastrozole extremely well without any major problems or concerns. She denies any hot flashes and muscle aches or pains.  REVIEW OF SYSTEMS:   Constitutional: Denies fevers, chills or abnormal weight loss Eyes: Denies blurriness of vision Ears, nose, mouth, throat, and face: Denies mucositis or sore throat Respiratory: Denies cough, dyspnea or wheezes Cardiovascular: Denies palpitation, chest discomfort or lower extremity swelling Gastrointestinal:  Denies nausea, heartburn or change in bowel habits Skin: Denies abnormal skin rashes Lymphatics: Denies new lymphadenopathy or easy bruising Neurological:Denies numbness, tingling or new weaknesses Behavioral/Psych: Mood is stable, no new changes  Breast:  denies any pain or lumps  or nodules in either breasts All other systems were reviewed with the patient and are negative.  I have reviewed the past medical history, past surgical history, social history and family history with the patient and they are unchanged from previous note.  ALLERGIES:  is allergic to dilaudid and penicillins.  MEDICATIONS:  Current Outpatient Prescriptions  Medication Sig Dispense Refill  . albuterol (PROVENTIL HFA;VENTOLIN HFA) 108 (90 BASE) MCG/ACT inhaler Inhale 2 puffs into the lungs daily as needed for wheezing or shortness of breath.    . anastrozole (ARIMIDEX) 1 MG tablet Take 1 tablet (1 mg total) by mouth daily. 90 tablet 3  . Artificial Tear Solution (SOOTHE HYDRATION OP) Place 1 drop into both eyes daily as needed (dry eyes).    Marland Kitchen aspirin EC 81 MG tablet Take 81 mg by mouth daily with supper.    Marland Kitchen atorvastatin (LIPITOR) 40 MG tablet Take 40 mg by mouth at bedtime.     . hyaluronate sodium (RADIAPLEXRX) GEL Apply 1 application topically 2 (two) times daily. Add to affected skin area lt breast after rad txs and bedtime daily    . hydrochlorothiazide (HYDRODIURIL) 25 MG tablet Take 25 mg by mouth daily.     Marland Kitchen HYDROcodone-acetaminophen (NORCO/VICODIN) 5-325 MG per tablet Take 1 tablet by mouth 2 (two) times daily. 1/2 tab in am and 1/2 tab night    . lisinopril (PRINIVIL,ZESTRIL) 40 MG tablet Take 40 mg by mouth daily.     Marland Kitchen LORazepam (ATIVAN) 1 MG tablet Take 1 mg by mouth at bedtime.    . metoprolol (LOPRESSOR) 50  MG tablet Take 50 mg by mouth 2 (two) times daily.    . Multiple Vitamins-Minerals (PRESERVISION/LUTEIN PO) Take 1 tablet by mouth 2 (two) times daily.     . nitroGLYCERIN (NITROSTAT) 0.4 MG SL tablet Place 0.4 mg under the tongue every 5 (five) minutes as needed for chest pain.     . Vitamin D, Ergocalciferol, (DRISDOL) 50000 UNITS CAPS capsule Take 50,000 Units by mouth every 14 (fourteen) days.      No current facility-administered medications for this visit.     PHYSICAL EXAMINATION: ECOG PERFORMANCE STATUS: 0 - Asymptomatic  Filed Vitals:   03/30/14 1105  BP: 132/64  Pulse: 79  Temp: 98.4 F (36.9 C)  Resp: 18   Filed Weights   03/30/14 1105  Weight: 187 lb 11.2 oz (85.14 kg)    GENERAL:alert, no distress and comfortable SKIN: skin color, texture, turgor are normal, no rashes or significant lesions EYES: normal, Conjunctiva are pink and non-injected, sclera clear OROPHARYNX:no exudate, no erythema and lips, buccal mucosa, and tongue normal  NECK: supple, thyroid normal size, non-tender, without nodularity LYMPH:  no palpable lymphadenopathy in the cervical, axillary or inguinal LUNGS: clear to auscultation and percussion with normal breathing effort HEART: regular rate & rhythm and no murmurs and no lower extremity edema ABDOMEN:abdomen soft, non-tender and normal bowel sounds Musculoskeletal:no cyanosis of digits and no clubbing  NEURO: alert & oriented x 3 with fluent speech, no focal motor/sensory deficits  LABORATORY DATA:  I have reviewed the data as listed   Chemistry      Component Value Date/Time   NA 142 11/09/2013 1330   K 4.1 11/09/2013 1330   CL 100 11/09/2013 1330   CO2 27 11/09/2013 1330   BUN 16 11/09/2013 1330   CREATININE 0.78 11/09/2013 1330      Component Value Date/Time   CALCIUM 9.3 11/09/2013 1330   ALKPHOS 67 01/18/2011 1620   AST 142* 01/18/2011 1620   ALT 156* 01/18/2011 1620   BILITOT 0.3 01/18/2011 1620       Lab Results  Component Value Date   WBC 8.8 01/25/2014   HGB 14.7 01/25/2014   HCT 45.6 01/25/2014   MCV 94.0 01/25/2014   PLT 175 12/19/2012   NEUTROABS 7.9* 01/29/2011    ASSESSMENT & PLAN:  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 Breast cancer surveillance: Patient will need mammogram be done in August 2016. I will see her back in 6 months for follow-up on anastrozole as well as to do a breast exam.  No orders of the defined types were placed in this encounter.   The patient has a good understanding of the overall plan. she agrees with it. She will call with any problems that may develop before her next visit here.   Rulon Eisenmenger, MD

## 2014-03-30 NOTE — Assessment & Plan Note (Signed)
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 Breast cancer surveillance: Patient will need mammogram be done in August 2016. I will see her back in 6 months for follow-up on anastrozole as well as to do a breast exam.

## 2014-03-30 NOTE — Telephone Encounter (Signed)
per pof to sch pt appt-gave tp copy of sch °

## 2014-06-22 ENCOUNTER — Ambulatory Visit (INDEPENDENT_AMBULATORY_CARE_PROVIDER_SITE_OTHER): Payer: Medicare Other | Admitting: Obstetrics & Gynecology

## 2014-06-22 ENCOUNTER — Encounter: Payer: Self-pay | Admitting: Obstetrics & Gynecology

## 2014-06-22 VITALS — BP 102/60 | HR 64 | Resp 12 | Ht 68.0 in | Wt 189.0 lb

## 2014-06-22 DIAGNOSIS — Z01419 Encounter for gynecological examination (general) (routine) without abnormal findings: Secondary | ICD-10-CM | POA: Diagnosis not present

## 2014-06-22 DIAGNOSIS — Z124 Encounter for screening for malignant neoplasm of cervix: Secondary | ICD-10-CM | POA: Diagnosis not present

## 2014-06-22 MED ORDER — VITAMIN D (ERGOCALCIFEROL) 1.25 MG (50000 UNIT) PO CAPS: 50000.0000 [IU] | ORAL_CAPSULE | ORAL | Status: DC

## 2014-06-22 NOTE — Addendum Note (Signed)
Addended by: Megan Salon on: 06/22/2014 02:00 PM   Modules accepted: SmartSet

## 2014-06-22 NOTE — Progress Notes (Addendum)
74 y.o. [id number] WidowedCaucasianF here for annual exam.  Doing well.  Diagnosed with Stage 1A ER/PR+ invasive ductal carcinoma.  Had post operative radiation.  On Anastrozole for five years.  Denies vaginal bleeding.    Cardiologist:  Dr. Daneen Schick.  He does not want pt on anti-inflammatories.  So has Hydrocodone for this.  Dr. Reynaldo Minium writes this for pt.  Pt takes 1/2 to 1 daily and no more than this.  Patient's last menstrual period was 02/27/2008.          Sexually active: No.  The current method of family planning is post menopausal status.   Exercising: Yes.    walking and gym Smoker:  Former smoker and currently chews nicorette gum  Health Maintenance: Pap:  07/25/11 WNL History of abnormal Pap:  no MMG:  09/28/13 MMG, left breast US/biopsies-breast cancer-next MMG scheduled in 8/16 Colonoscopy:  1/09-virtual-repeat in 10 years BMD:   2008-Dr Reynaldo Minium TDaP:  Dr Reynaldo Minium Screening Labs: PCP, Hb today: PCP, Urine today: PCP   reports that she quit smoking about 16 years ago. Her smoking use included Cigarettes. She has a 67.5 pack-year smoking history. She has never used smokeless tobacco. She reports that she drinks about 0.6 - 1.2 oz of alcohol per week. She reports that she does not use illicit drugs.  Past Medical History  Diagnosis Date  . Hypertension   . Back pain     Lower Back pain - takes Cortisone shots  . Shortness of breath   . Arthritis   . Anxiety   . Rosacea 2008    both eyes  . Bone spur 2009    lower back  . MI (myocardial infarction) 11/12    [facility]  . Coronary artery disease   . Complication of anesthesia     throat irrition from past intubations, intubation granulomas  . Hyperlipemia   . Breast cancer 2015    finished radiation 12/15  . Cataract   . Radiation 01/06/14-02/04/14    left breast     Past Surgical History  Procedure Laterality Date  . Eye surgery      Laser Eye Surgery approx 2007  . Tubal ligation      bandaid surgery  .  Tonsillectomy and adenoidectomy    . Hysteroscopy  9/10    D&C (polyp)  . Breast biopsy  8/10    fibrocystic changes  . Heart stent      failed-no stents  . Cataract extraction      both  . Breast lumpectomy with needle localization and axillary sentinel lymph node bx Left 11/13/2013    Procedure: NEEDLE LOCALIZATION LEFT BREAST LUMPECTOMY WITH LEFT  AXILLARY SENTINEL LYMPH NODE BX;  Surgeon: Excell Seltzer, MD;  Location: [place];  Service: General;  Laterality: Left;  . Back surgery    . Left heart catheterization with coronary angiogram N/A 01/18/2011    Procedure: LEFT HEART CATHETERIZATION WITH CORONARY ANGIOGRAM;  Surgeon: Clent Demark, MD;  Location: [facility] CATH LAB;  Service: Cardiovascular;  Laterality: N/A;  . Percutaneous coronary stent intervention (pci-s) N/A 01/18/2011    Procedure: PERCUTANEOUS CORONARY STENT INTERVENTION (PCI-S);  Surgeon: Clent Demark, MD;  Location: [facility] CATH LAB;  Service: Cardiovascular;  Laterality: N/A;    Current Outpatient Prescriptions  Medication Sig Dispense Refill  . albuterol (PROVENTIL HFA;VENTOLIN HFA) 108 (90 BASE) MCG/ACT inhaler Inhale 2 puffs into the lungs daily as needed for wheezing or shortness of breath.    Marland Kitchen  anastrozole (ARIMIDEX) 1 MG tablet Take 1 tablet (1 mg total) by mouth daily. 90 tablet 3  . Artificial Tear Solution (SOOTHE HYDRATION OP) Place 1 drop into both eyes daily as needed (dry eyes).    Marland Kitchen aspirin EC 81 MG tablet Take 81 mg by mouth daily with supper.    Marland Kitchen atorvastatin (LIPITOR) 40 MG tablet Take 40 mg by mouth at bedtime.     . hyaluronate sodium (RADIAPLEXRX) GEL Apply 1 application topically 2 (two) times daily. Add to affected skin area lt breast after rad txs and bedtime daily    . hydrochlorothiazide (HYDRODIURIL) 25 MG tablet Take 25 mg by mouth daily.     Marland Kitchen HYDROcodone-acetaminophen (NORCO/VICODIN) 5-325 MG per tablet Take 1 tablet by mouth daily. 1/2 tab in am and 1/2 tab night    .  lisinopril (PRINIVIL,ZESTRIL) 40 MG tablet Take 40 mg by mouth daily.     Marland Kitchen LORazepam (ATIVAN) 1 MG tablet Take 1 mg by mouth at bedtime.    . metoprolol (LOPRESSOR) 50 MG tablet Take 50 mg by mouth 2 (two) times daily.    . Multiple Vitamins-Minerals (PRESERVISION/LUTEIN PO) Take 1 tablet by mouth 2 (two) times daily.     . nitroGLYCERIN (NITROSTAT) 0.4 MG SL tablet Place 0.4 mg under the tongue every 5 (five) minutes as needed for chest pain.     . Vitamin D, Ergocalciferol, (DRISDOL) 50000 UNITS CAPS capsule Take 50,000 Units by mouth every 14 (fourteen) days.      No current facility-administered medications for this visit.    Family History  Problem Relation Age of Onset  . Rectal cancer Sister   . Breast cancer Sister   . Breast cancer Maternal Grandmother 90  . Congestive Heart Failure Father   . CVA Father     MI, CAD  . Coronary artery disease Father   . Breast cancer Maternal Aunt   . Valvular heart disease Mother   . Hypertension Mother   . Cancer Sister     ROS:  Pertinent items are noted in HPI.  Otherwise, a comprehensive ROS was negative.  Exam:   BP 102/60 mmHg  Pulse 64  Resp 12  Ht 5\' 8"  (1.727 m)  Wt 189 lb (85.73 kg)  BMI 28.74 kg/m2  LMP 02/27/2008  Weight change: -1#  Height: 5\' 8"  (172.7 cm)  Ht Readings from Last 3 Encounters:  06/22/14 5\' 8"  (1.727 m)  03/30/14 5' 6.75" (1.695 m)  03/15/14 5' 6.75" (1.695 m)    General appearance: alert, cooperative and appears stated age Head: Normocephalic, without obvious abnormality, atraumatic Neck: no adenopathy, supple, symmetrical, trachea midline and thyroid normal to inspection and palpation Lungs: clear to auscultation bilaterally Breasts: no masses or LAD on right, well healed incisions on left breast, radiation changes Heart: regular rate and rhythm Abdomen: soft, non-tender; bowel sounds normal; no masses,  no organomegaly Extremities: extremities normal, atraumatic, no cyanosis or edema Skin:  Skin color, texture, turgor normal. No rashes or lesions Lymph nodes: Cervical, supraclavicular, and axillary nodes normal. No abnormal inguinal nodes palpated Neurologic: Grossly normal   Pelvic: External genitalia:  no lesions              Urethra:  normal appearing urethra with no masses, tenderness or lesions              Bartholins and Skenes: normal                 Vagina: normal appearing  vagina with normal color and discharge, no lesions              Cervix: no lesions              Pap taken: Yes.   Bimanual Exam:  Uterus:  normal size, contour, position, consistency, mobility, non-tender              Adnexa: normal adnexa and no mass, fullness, tenderness               Rectovaginal: Confirms               Anus:  normal sphincter tone, no lesions  Chaperone was present for exam.  A:  Well Woman with normal exam PMP, no HRT OAB H/O endometrial polyp H/O invasive ductal breast cancer, ER/PR+, treated with lumpectomy and radiation.  Now on Anastrozole.    P: Mammogram yearly. pap smear today Labs with Dr. Reynaldo Minium On Vit D 50,000 IU every other week.  rx to pharmacy. return annually or prn

## 2014-06-22 NOTE — Addendum Note (Signed)
Addended by: Megan Salon on: 06/22/2014 01:58 PM   Modules accepted: Miquel Dunn

## 2014-06-24 LAB — IPS PAP SMEAR ONLY

## 2014-09-05 NOTE — Assessment & Plan Note (Signed)
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 Breast cancer surveillance: Patient will need mammogram be done in August 2016. I will see her back in 6 months for follow-up on anastrozole as well as to do a breast exam.  RTC in 6 months

## 2014-09-06 ENCOUNTER — Other Ambulatory Visit: Payer: Medicare Other

## 2014-09-06 ENCOUNTER — Ambulatory Visit: Payer: Medicare Other | Admitting: Hematology and Oncology

## 2014-09-06 ENCOUNTER — Telehealth: Payer: Self-pay | Admitting: Hematology and Oncology

## 2014-09-06 NOTE — Telephone Encounter (Signed)
pt called to r/s appt..done...pt aware of new d.t °

## 2014-09-13 NOTE — Assessment & Plan Note (Signed)
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 Breast Cancer Surveillance: 1. Breast exam 09/14/14: Normal 2. Mammogram: To be done in August 2016

## 2014-09-14 ENCOUNTER — Ambulatory Visit (HOSPITAL_BASED_OUTPATIENT_CLINIC_OR_DEPARTMENT_OTHER): Payer: Medicare Other | Admitting: Hematology and Oncology

## 2014-09-14 ENCOUNTER — Encounter: Payer: Self-pay | Admitting: Hematology and Oncology

## 2014-09-14 ENCOUNTER — Telehealth: Payer: Self-pay | Admitting: Hematology and Oncology

## 2014-09-14 VITALS — BP 113/98 | HR 74 | Temp 98.8°F | Resp 18 | Ht 68.0 in | Wt 187.8 lb

## 2014-09-14 DIAGNOSIS — Z170 Estrogen receptor positive status [ER+]: Secondary | ICD-10-CM | POA: Diagnosis not present

## 2014-09-14 DIAGNOSIS — C50312 Malignant neoplasm of lower-inner quadrant of left female breast: Secondary | ICD-10-CM

## 2014-09-14 DIAGNOSIS — Z79811 Long term (current) use of aromatase inhibitors: Secondary | ICD-10-CM

## 2014-09-14 NOTE — Telephone Encounter (Signed)
Gave avs & calendar for January °

## 2014-09-14 NOTE — Progress Notes (Signed)
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 N0 M0 stage IA ER 100% PR 98% Ki-67 6% HER-2 negative ratio 1.56   01/04/2014 - 02/05/2014 Radiation Therapy Left breast 42.72 gray in 16 fractions; lumpectomy cavity boost 10 gray in 5 fractions, cumulative dose to the lumpectomy cavity 52.72 gray   02/26/2014 -  Anti-estrogen oral therapy Anastrazole 1 mg daily    CHIEF COMPLIANT: Follow-up on anastrozole  INTERVAL HISTORY: Rachel Chase is a 74 year old with above-mentioned history of left breast cancer treated with lumpectomy followed by radiation and is currently on anastrozole since January 2016. She has been tolerating anastrozole extremely well without any major problems or concerns. She does not have any hot flashes although at times she feels extremely sweaty profusely and she has noticed this when she went for exercises. It has happened 3 times so far. Denies any myalgias. She is also ballroom dancing. She has been staying very active and exercises frequently.  REVIEW OF SYSTEMS:   Constitutional: Denies fevers, chills or abnormal weight loss Eyes: Denies blurriness of vision Ears, nose, mouth, throat, and face: Denies mucositis or sore throat Respiratory: Denies cough, dyspnea or wheezes Cardiovascular: Denies palpitation, chest discomfort or lower extremity swelling Gastrointestinal:  Denies nausea, heartburn or change in bowel habits Skin: Denies  abnormal skin rashes Lymphatics: Denies new lymphadenopathy or easy bruising Neurological:Denies numbness, tingling or new weaknesses Behavioral/Psych: Mood is stable, no new changes  Breast:  denies any pain or lumps or nodules in either breasts All other systems were reviewed with the patient and are negative.  I have reviewed the past medical history, past surgical history, social history and family history with the patient and they are unchanged from previous note.  ALLERGIES:  is allergic to dilaudid and penicillins.  MEDICATIONS:  Current Outpatient Prescriptions  Medication Sig Dispense Refill  . albuterol (PROVENTIL HFA;VENTOLIN HFA) 108 (90 BASE) MCG/ACT inhaler Inhale 2 puffs into the lungs daily as needed for wheezing or shortness of breath.    . anastrozole (ARIMIDEX) 1 MG tablet Take 1 tablet (1 mg total) by mouth daily. 90 tablet 3  . Artificial Tear Solution (SOOTHE HYDRATION OP) Place 1 drop into both eyes daily as needed (dry eyes).    Marland Kitchen aspirin EC 81 MG tablet Take 81 mg by mouth daily with supper.    Marland Kitchen atorvastatin (LIPITOR) 40 MG tablet Take 40 mg by mouth at bedtime.     . hyaluronate sodium (RADIAPLEXRX) GEL Apply 1 application topically 2 (two) times daily. Add to affected skin area lt breast after rad txs and bedtime daily    . hydrochlorothiazide (HYDRODIURIL) 25 MG tablet Take 25 mg by mouth daily.     Marland Kitchen HYDROcodone-acetaminophen (NORCO/VICODIN) 5-325 MG per tablet Take 1 tablet by mouth daily. 1/2 tab in am and 1/2 tab night    . lisinopril (PRINIVIL,ZESTRIL) 40  MG tablet Take 40 mg by mouth daily.     Marland Kitchen LORazepam (ATIVAN) 1 MG tablet Take 1 mg by mouth at bedtime.    . metoprolol (LOPRESSOR) 50 MG tablet Take 50 mg by mouth 2 (two) times daily.    . Multiple Vitamins-Minerals (PRESERVISION/LUTEIN PO) Take 1 tablet by mouth 2 (two) times daily.     . nitroGLYCERIN (NITROSTAT) 0.4 MG SL tablet Place 0.4 mg under the tongue every 5 (five) minutes as needed for chest  pain.     . Vitamin D, Ergocalciferol, (DRISDOL) 50000 UNITS CAPS capsule Take 1 capsule (50,000 Units total) by mouth every 14 (fourteen) days. 12 capsule 4   No current facility-administered medications for this visit.    PHYSICAL EXAMINATION: ECOG PERFORMANCE STATUS: 0 - Asymptomatic  Filed Vitals:   09/14/14 1044  BP: 113/98  Pulse: 74  Temp: 98.8 F (37.1 C)  Resp: 18   Filed Weights   09/14/14 1044  Weight: 187 lb 12.8 oz (85.186 kg)    GENERAL:alert, no distress and comfortable SKIN: skin color, texture, turgor are normal, no rashes or significant lesions EYES: normal, Conjunctiva are pink and non-injected, sclera clear OROPHARYNX:no exudate, no erythema and lips, buccal mucosa, and tongue normal  NECK: supple, thyroid normal size, non-tender, without nodularity LYMPH:  no palpable lymphadenopathy in the cervical, axillary or inguinal LUNGS: clear to auscultation and percussion with normal breathing effort HEART: regular rate & rhythm and no murmurs and no lower extremity edema ABDOMEN:abdomen soft, non-tender and normal bowel sounds Musculoskeletal:no cyanosis of digits and no clubbing  NEURO: alert & oriented x 3 with fluent speech, no focal motor/sensory deficits BREAST: No palpable masses or nodules in either right or left breasts. No palpable axillary supraclavicular or infraclavicular adenopathy no breast tenderness or nipple discharge. (exam performed in the presence of a chaperone)  LABORATORY DATA:  I have reviewed the data as listed   Chemistry      Component Value Date/Time   NA 142 11/09/2013 1330   K 4.1 11/09/2013 1330   CL 100 11/09/2013 1330   CO2 27 11/09/2013 1330   BUN 16 11/09/2013 1330   CREATININE 0.78 11/09/2013 1330      Component Value Date/Time   CALCIUM 9.3 11/09/2013 1330   ALKPHOS 67 01/18/2011 1620   AST 142* 01/18/2011 1620   ALT 156* 01/18/2011 1620   BILITOT 0.3 01/18/2011 1620       Lab Results  Component Value Date    WBC 8.8 01/25/2014   HGB 14.7 01/25/2014   HCT 45.6 01/25/2014   MCV 94.0 01/25/2014   PLT 175 12/19/2012   NEUTROABS 7.9* 01/29/2011    ASSESSMENT & PLAN:  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 Breast Cancer Surveillance: 1. Breast exam 09/14/14: Normal 2. Mammogram: To be done in August 2016   return to clinic in 6 months for follow-up and after that we can see her once a year.  No orders of the defined types were placed in this encounter.   The patient has a good understanding of the overall plan. she agrees with it. she will call with any problems that may develop  before the next visit here.   Rulon Eisenmenger, MD

## 2014-10-04 ENCOUNTER — Ambulatory Visit: Payer: Medicare Other | Admitting: Radiation Oncology

## 2014-10-06 ENCOUNTER — Ambulatory Visit
Admission: RE | Admit: 2014-10-06 | Discharge: 2014-10-06 | Disposition: A | Discharge status: Home / Self Care | Payer: Medicare Other | Source: Ambulatory Visit | Attending: Radiation Oncology | Admitting: Radiation Oncology

## 2014-10-06 ENCOUNTER — Encounter: Payer: Self-pay | Admitting: Radiation Oncology

## 2014-10-06 VITALS — BP 106/68 | HR 64 | Temp 98.0°F | Resp 16 | Ht 68.0 in | Wt 186.6 lb

## 2014-10-06 DIAGNOSIS — C50312 Malignant neoplasm of lower-inner quadrant of left female breast: Secondary | ICD-10-CM

## 2014-10-06 NOTE — Progress Notes (Signed)
Rachel Chase here for follow up.  She denies pain today.  She does taking 1/2 tablet of norco for chronic back pain.  She is taking Arimidex and has a mammogram scheduled for 10/12/14.  She denies fatigue and is more active.  The skin on her left breast is intact.  She reports feeling shortness of breath and says she has had this since her heart attack in 2012.  Her oxygen saturation today was 97%    BP 106/68 mmHg  Pulse 64  Temp(Src) 98 F (36.7 C) (Oral)  Resp 16  Ht 5\' 8"  (1.727 m)  Wt 186 lb 9.6 oz (84.641 kg)  BMI 28.38 kg/m2  SpO2 97%  LMP 02/27/2008

## 2014-10-06 NOTE — Progress Notes (Signed)
Radiation Oncology         [phone]) [phone] ________________________________  Name: Rachel Chase MRN: [id number]  Date: 10/06/2014  DOB: 1940/12/23  Follow-Up Visit Note  CC: Rachel Lyons, MD  Rachel Bunting, MD    ICD-9-CM ICD-10-CM   1. Primary cancer of lower-inner quadrant of left female breast 174.3 C50.312     Diagnosis:   Stage I invasive ductal carcinoma of the left breast  Interval Since Last Radiation:  8  months  Narrative: Rachel Chase here for follow up. She denies pain today. She does taking 1/2 tablet of norco for chronic back pain. She is taking Arimidex and has a mammogram scheduled for 10/12/14. She denies fatigue and is more active.  She reports feeling shortness of breath and says she has had this since her heart attack in 2012. Her oxygen saturation today was 97%           Patient reports good energy and is returning to normal physical activity                    ALLERGIES:  is allergic to dilaudid and penicillins.  Meds: Current Outpatient Prescriptions  Medication Sig Dispense Refill  . albuterol (PROVENTIL HFA;VENTOLIN HFA) 108 (90 BASE) MCG/ACT inhaler Inhale 2 puffs into the lungs daily as needed for wheezing or shortness of breath.    . anastrozole (ARIMIDEX) 1 MG tablet Take 1 tablet (1 mg total) by mouth daily. 90 tablet 3  . Artificial Tear Solution (SOOTHE HYDRATION OP) Place 1 drop into both eyes daily as needed (dry eyes).    Marland Kitchen aspirin EC 81 MG tablet Take 81 mg by mouth daily with supper.    Marland Kitchen atorvastatin (LIPITOR) 40 MG tablet Take 40 mg by mouth at bedtime.     . hydrochlorothiazide (HYDRODIURIL) 25 MG tablet Take 25 mg by mouth daily.     Marland Kitchen HYDROcodone-acetaminophen (NORCO/VICODIN) 5-325 MG per tablet Take 1 tablet by mouth daily. 1/2 tab in am and 1/2 tab night    . lisinopril (PRINIVIL,ZESTRIL) 40 MG tablet Take 40 mg by mouth daily.     Marland Kitchen LORazepam (ATIVAN) 1 MG tablet Take 1 mg by mouth at bedtime.    . metoprolol (LOPRESSOR)  50 MG tablet Take 50 mg by mouth 2 (two) times daily.    . Multiple Vitamins-Minerals (PRESERVISION/LUTEIN PO) Take 1 tablet by mouth 2 (two) times daily.     . Vitamin D, Ergocalciferol, (DRISDOL) 50000 UNITS CAPS capsule Take 1 capsule (50,000 Units total) by mouth every 14 (fourteen) days. 12 capsule 4  . nitroGLYCERIN (NITROSTAT) 0.4 MG SL tablet Place 0.4 mg under the tongue every 5 (five) minutes as needed for chest pain.      No current facility-administered medications for this encounter.    Physical Findings: The patient is in no acute distress. Patient is alert and oriented.  height is 5\' 8"  (1.727 m) and weight is 186 lb 9.6 oz (84.641 kg). Her oral temperature is 98 F (36.7 C). Her blood pressure is 106/68 and her pulse is 64. Her respiration is 16 and oxygen saturation is 97%. .  No palpable subclavicular or axillary adenopathy. The lungs are clear to auscultation. The heart has a regular rhythm and rate. Examination of the right breast reveals no mass or nipple discharge. Examination of left breast reveals the patient's skin to be well-healed. There is some mild edema in the breast and mild hyperpigmentation changes. No dominant masses appreciated in the  breast or  nipple discharge.   Lab Findings: Lab Results  Component Value Date   WBC 8.8 01/25/2014   HGB 14.7 01/25/2014   HCT 45.6 01/25/2014   MCV 94.0 01/25/2014   PLT 175 12/19/2012    Radiographic Findings: No results found.  Impression:    No evidence of recurrence on clinical exam today    Plan:  Follow-up PRN with radiation oncology. Patient will continue to closely follow with medical oncology.  -----------------------------------  Blair Promise, PhD, MD  This document serves as a record of services personally performed by Gery Pray, MD. It was created on his behalf by Derek Mound, a trained medical scribe. The creation of this record is based on the scribe's personal observations and the provider's  statements to them. This document has been checked and approved by the attending provider.

## 2014-11-10 ENCOUNTER — Other Ambulatory Visit: Payer: Self-pay

## 2014-11-10 ENCOUNTER — Other Ambulatory Visit: Payer: Self-pay | Admitting: Obstetrics & Gynecology

## 2014-11-10 DIAGNOSIS — Z853 Personal history of malignant neoplasm of breast: Secondary | ICD-10-CM

## 2014-11-19 ENCOUNTER — Ambulatory Visit
Admission: RE | Admit: 2014-11-19 | Discharge: 2014-11-19 | Disposition: A | Discharge status: Home / Self Care | Payer: Medicare Other | Source: Ambulatory Visit | Attending: Obstetrics & Gynecology | Admitting: Obstetrics & Gynecology

## 2014-11-19 DIAGNOSIS — Z853 Personal history of malignant neoplasm of breast: Secondary | ICD-10-CM

## 2015-02-11 ENCOUNTER — Other Ambulatory Visit: Payer: Self-pay | Admitting: Hematology and Oncology

## 2015-02-11 NOTE — Telephone Encounter (Signed)
Chart reviewed.

## 2015-03-14 NOTE — Assessment & Plan Note (Signed)
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 Breast Cancer Surveillance: 1. Breast exam 03/14/2068: Normal 2. Mammogram: 11/19/2014: Benign, breast density category B   return to clinic in 1 year.

## 2015-03-15 ENCOUNTER — Encounter: Payer: Self-pay | Admitting: Hematology and Oncology

## 2015-03-15 ENCOUNTER — Telehealth: Payer: Self-pay | Admitting: Hematology and Oncology

## 2015-03-15 ENCOUNTER — Ambulatory Visit (HOSPITAL_BASED_OUTPATIENT_CLINIC_OR_DEPARTMENT_OTHER): Payer: Medicare Other | Admitting: Hematology and Oncology

## 2015-03-15 VITALS — BP 147/67 | HR 73 | Temp 98.3°F | Resp 19 | Wt 185.1 lb

## 2015-03-15 DIAGNOSIS — C50312 Malignant neoplasm of lower-inner quadrant of left female breast: Secondary | ICD-10-CM

## 2015-03-15 DIAGNOSIS — Z170 Estrogen receptor positive status [ER+]: Secondary | ICD-10-CM | POA: Diagnosis not present

## 2015-03-15 DIAGNOSIS — Z79811 Long term (current) use of aromatase inhibitors: Secondary | ICD-10-CM | POA: Diagnosis not present

## 2015-03-15 MED ORDER — ANASTROZOLE 1 MG PO TABS: 1.0000 mg | ORAL_TABLET | Freq: Every day | ORAL | Status: DC

## 2015-03-15 NOTE — Telephone Encounter (Signed)
avs printed for patient,no schedule open for 04/2016 will send inbasket to [facility] to hold

## 2015-03-15 NOTE — Progress Notes (Signed)
Patient Care Team: Burnard Bunting, MD as PCP - General (Internal Medicine)  DIAGNOSIS: Primary cancer of lower-inner quadrant of left female breast [facility])   Staging form: Breast, AJCC 7th Edition     Clinical: Stage IA (T1, N0, cM0) - Unsigned       Prognostic indicators: ER?PR POS  HER 2 NEG      Pathologic: No stage assigned - Unsigned       Prognostic indicators: ER?PR POS  HER 2 NEG    SUMMARY OF ONCOLOGIC HISTORY:   Primary cancer of lower-inner quadrant of left female breast ([place])   11/13/2013 Surgery Left breast lumpectomy: Invasive ductal carcinoma 1.4 cm with DCIS, 1 SLN negative T1 C. N0 M0 stage IA ER 100% PR 98% Ki-67 6% HER-2 negative ratio 1.56   01/04/2014 - 02/05/2014 Radiation Therapy Left breast 42.72 gray in 16 fractions; lumpectomy cavity boost 10 gray in 5 fractions, cumulative dose to the lumpectomy cavity 52.72 gray   02/26/2014 -  Anti-estrogen oral therapy Anastrazole 1 mg daily    CHIEF COMPLIANT: Follow-up on anastrozole  INTERVAL HISTORY: Rachel Chase is a 75 year old with above-mentioned history of left breast cancer currently on anastrozole. She is tolerating it very well without any major problems or concerns. She does have occasional hot flashes but she appears to manage it just fine. Denies any myalgias. She continues to dance regularly once a week. 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  REVIEW OF SYSTEMS:   Constitutional: Denies fevers, chills or abnormal weight loss Eyes: Denies blurriness of vision Ears, nose, mouth, throat, and face: Denies mucositis or sore throat Respiratory: Denies cough, dyspnea or wheezes Cardiovascular: Denies palpitation, chest discomfort Gastrointestinal:  Denies nausea, heartburn or change in bowel habits Skin: Denies abnormal skin rashes Lymphatics: Denies new lymphadenopathy or easy bruising Neurological:Denies numbness, tingling or new weaknesses Behavioral/Psych: Mood is  stable, no new changes  Extremities: No lower extremity edema Breast:  denies any pain or lumps or nodules in either breasts All other systems were reviewed with the patient and are negative.  I have reviewed the past medical history, past surgical history, social history and family history with the patient and they are unchanged from previous note.  ALLERGIES:  is allergic to dilaudid and penicillins.  MEDICATIONS:  Current Outpatient Prescriptions  Medication Sig Dispense Refill  . albuterol (PROVENTIL HFA;VENTOLIN HFA) 108 (90 BASE) MCG/ACT inhaler Inhale 2 puffs into the lungs daily as needed for wheezing or shortness of breath.    . anastrozole (ARIMIDEX) 1 MG tablet Take 1 tablet (1 mg total) by mouth daily. 90 tablet 3  . Artificial Tear Solution (SOOTHE HYDRATION OP) Place 1 drop into both eyes daily as needed (dry eyes).    Marland Kitchen aspirin EC 81 MG tablet Take 81 mg by mouth daily with supper.    Marland Kitchen atorvastatin (LIPITOR) 40 MG tablet Take 40 mg by mouth at bedtime.     . hydrochlorothiazide (HYDRODIURIL) 25 MG tablet Take 25 mg by mouth daily.     Marland Kitchen HYDROcodone-acetaminophen (NORCO/VICODIN) 5-325 MG per tablet Take 1 tablet by mouth daily. 1/2 tab in am and 1/2 tab night    . lisinopril (PRINIVIL,ZESTRIL) 40 MG tablet Take 40 mg by mouth daily.     Marland Kitchen LORazepam (ATIVAN) 1 MG tablet Take 1 mg by mouth at bedtime.    . metoprolol (LOPRESSOR) 50 MG tablet Take 50 mg by mouth 2 (two) times daily.    Marland Kitchen  Multiple Vitamins-Minerals (PRESERVISION/LUTEIN PO) Take 1 tablet by mouth 2 (two) times daily.     . nitroGLYCERIN (NITROSTAT) 0.4 MG SL tablet Place 0.4 mg under the tongue every 5 (five) minutes as needed for chest pain.     . Vitamin D, Ergocalciferol, (DRISDOL) 50000 UNITS CAPS capsule Take 1 capsule (50,000 Units total) by mouth every 14 (fourteen) days. 12 capsule 4   No current facility-administered medications for this visit.    PHYSICAL EXAMINATION: ECOG PERFORMANCE STATUS: 0 -  Asymptomatic  Filed Vitals:   03/15/15 1017  BP: 147/67  Pulse: 73  Temp: 98.3 F (36.8 C)  Resp: 19   Filed Weights   03/15/15 1017  Weight: 185 lb 1.6 oz (83.961 kg)    GENERAL:alert, no distress and comfortable SKIN: skin color, texture, turgor are normal, no rashes or significant lesions EYES: normal, Conjunctiva are pink and non-injected, sclera clear OROPHARYNX:no exudate, no erythema and lips, buccal mucosa, and tongue normal  NECK: supple, thyroid normal size, non-tender, without nodularity LYMPH:  no palpable lymphadenopathy in the cervical, axillary or inguinal LUNGS: clear to auscultation and percussion with normal breathing effort HEART: regular rate & rhythm and no murmurs and no lower extremity edema ABDOMEN:abdomen soft, non-tender and normal bowel sounds MUSCULOSKELETAL:no cyanosis of digits and no clubbing  NEURO: alert & oriented x 3 with fluent speech, no focal motor/sensory deficits EXTREMITIES: No lower extremity edema BREAST: No palpable masses or nodules in either right or left breasts. No palpable axillary supraclavicular or infraclavicular adenopathy no breast tenderness or nipple discharge. (exam performed in the presence of a chaperone)  LABORATORY DATA:  I have reviewed the data as listed   Chemistry      Component Value Date/Time   NA 142 11/09/2013 1330   K 4.1 11/09/2013 1330   CL 100 11/09/2013 1330   CO2 27 11/09/2013 1330   BUN 16 11/09/2013 1330   CREATININE 0.78 11/09/2013 1330      Component Value Date/Time   CALCIUM 9.3 11/09/2013 1330   ALKPHOS 67 01/18/2011 1620   AST 142* 01/18/2011 1620   ALT 156* 01/18/2011 1620   BILITOT 0.3 01/18/2011 1620       Lab Results  Component Value Date   WBC 8.8 01/25/2014   HGB 14.7 01/25/2014   HCT 45.6 01/25/2014   MCV 94.0 01/25/2014   PLT 175 12/19/2012   NEUTROABS 7.9* 01/29/2011     ASSESSMENT & PLAN:  Primary cancer of lower-inner quadrant of left female breast Left breast  invasive ductal carcinoma status post lumpectomy on 11/13/2013 ER/PR percent PR 98% gases 76% HER-2 negative ratio 1.65 status post radiation therapy completed December 2015 started antiestrogen therapy with anastrozole 1 mg daily 02/26/2014 plan treatment duration is 5 years  Anastrozole toxicities: Patient does not have any side effects to antiestrogen therapy. Occasional hot flashes  Breast Cancer Surveillance: 1. Breast exam 03/15/2015: Normal 2. Mammogram: 11/19/2014: Benign, breast density category B   return to clinic in March 2018 so that Dr. Excell Seltzer and I can see her 6 months apart.    No orders of the defined types were placed in this encounter.   The patient has a good understanding of the overall plan. she agrees with it. she will call with any problems that may develop before the next visit here.   Rulon Eisenmenger, MD 03/15/2015

## 2016-01-16 ENCOUNTER — Other Ambulatory Visit: Payer: Self-pay | Admitting: Internal Medicine

## 2016-01-16 ENCOUNTER — Other Ambulatory Visit: Payer: Self-pay | Admitting: Hematology and Oncology

## 2016-01-16 DIAGNOSIS — Z853 Personal history of malignant neoplasm of breast: Secondary | ICD-10-CM

## 2016-01-18 ENCOUNTER — Ambulatory Visit (INDEPENDENT_AMBULATORY_CARE_PROVIDER_SITE_OTHER): Payer: Medicare Other | Admitting: Obstetrics & Gynecology

## 2016-01-18 ENCOUNTER — Encounter: Payer: Self-pay | Admitting: Obstetrics & Gynecology

## 2016-01-18 VITALS — BP 112/60 | HR 80 | Resp 16 | Ht 67.0 in | Wt 191.0 lb

## 2016-01-18 DIAGNOSIS — N9089 Other specified noninflammatory disorders of vulva and perineum: Secondary | ICD-10-CM | POA: Diagnosis not present

## 2016-01-18 DIAGNOSIS — C50312 Malignant neoplasm of lower-inner quadrant of left female breast: Secondary | ICD-10-CM | POA: Diagnosis not present

## 2016-01-18 DIAGNOSIS — Z01419 Encounter for gynecological examination (general) (routine) without abnormal findings: Secondary | ICD-10-CM

## 2016-01-18 NOTE — Progress Notes (Signed)
75 y.o. [id number] WidowedCaucasianF here for annual exam.  Doing well.  Denies vaginal bleeding.  Still dancing.  Friend she dances with broke both ankles and then had a stent placed.  Staying busy at [place].  She does report she has some SOB with fast walking.  Does not have chest pain.  Is planning on seeing cardiology again.  This is not a new issue for her.  Has considered seeing pulmonology as well.  H/o smoking.  Has stopped.  PCP: Dr. Reynaldo Minium  Patient's last menstrual period was 02/27/2008.          Sexually active: No.  The current method of family planning is post menopausal status.    Exercising: Yes.    class 3 x weekly Smoker:  no  Health Maintenance: Pap:  06/22/14 Neg  History of abnormal Pap:  no MMG:  11/19/14 BIRADS2:Benign. Has appt 01/24/16  Colonoscopy:  02/2007 f/u 10 years with PCP BMD:   2008 with PCP TDaP: PCP Pneumonia vaccine(s):  Done with PCP Zostavax:   Done with PCP Hep C testing: not indicated Screening Labs: PCP, Urine today: PCP   reports that she quit smoking about 17 years ago. Her smoking use included Cigarettes. She has a 67.50 pack-year smoking history. She uses smokeless tobacco. She reports that she drinks about 0.6 - 1.2 oz of alcohol per week . She reports that she does not use drugs.  Past Medical History:  Diagnosis Date  . Anxiety   . Arthritis   . Back pain    Lower Back pain - takes Cortisone shots  . Bone spur 2009   lower back  . Breast cancer ([place]) 2015   finished radiation 12/15  . Cataract   . Complication of anesthesia    throat irrition from past intubations, intubation granulomas  . Coronary artery disease   . Hyperlipemia   . Hypertension   . MI (myocardial infarction) 11/12   [facility]  . Radiation 01/06/14-02/04/14   left breast   . Rosacea 2008   both eyes  . Shortness of breath     Past Surgical History:  Procedure Laterality Date  . BACK SURGERY    . BREAST BIOPSY  8/10   fibrocystic changes  . BREAST  LUMPECTOMY WITH NEEDLE LOCALIZATION AND AXILLARY SENTINEL LYMPH NODE BX Left 11/13/2013   Procedure: NEEDLE LOCALIZATION LEFT BREAST LUMPECTOMY WITH LEFT  AXILLARY SENTINEL LYMPH NODE BX;  Surgeon: Excell Seltzer, MD;  Location: [facility];  Service: General;  Laterality: Left;  . CATARACT EXTRACTION     both  . [facility] approx 2007  . heart stent     failed-no stents  . HYSTEROSCOPY  9/10   D&C (polyp)  . LEFT HEART CATHETERIZATION WITH CORONARY ANGIOGRAM N/A 01/18/2011   Procedure: LEFT HEART CATHETERIZATION WITH CORONARY ANGIOGRAM;  Surgeon: Clent Demark, MD;  Location: [facility] CATH LAB;  Service: Cardiovascular;  Laterality: N/A;  . PERCUTANEOUS CORONARY STENT INTERVENTION (PCI-S) N/A 01/18/2011   Procedure: PERCUTANEOUS CORONARY STENT INTERVENTION (PCI-S);  Surgeon: Clent Demark, MD;  Location: [facility] CATH LAB;  Service: Cardiovascular;  Laterality: N/A;  . TONSILLECTOMY AND ADENOIDECTOMY    . TUBAL LIGATION     bandaid surgery    Current Outpatient Prescriptions  Medication Sig Dispense Refill  . albuterol (PROVENTIL HFA;VENTOLIN HFA) 108 (90 BASE) MCG/ACT inhaler Inhale 2 puffs into the lungs daily as needed for wheezing or shortness of breath.    Marland Kitchen  anastrozole (ARIMIDEX) 1 MG tablet Take 1 tablet (1 mg total) by mouth daily. 90 tablet 3  . Artificial Tear Solution (SOOTHE HYDRATION OP) Place 1 drop into both eyes daily as needed (dry eyes).    Marland Kitchen aspirin EC 81 MG tablet Take 81 mg by mouth daily with supper.    Marland Kitchen atorvastatin (LIPITOR) 40 MG tablet Take 40 mg by mouth at bedtime.     . hydrochlorothiazide (HYDRODIURIL) 25 MG tablet Take 25 mg by mouth daily.     Marland Kitchen HYDROcodone-acetaminophen (NORCO/VICODIN) 5-325 MG per tablet Take 1 tablet by mouth daily. 1/2 tab in am and 1/2 tab night    . lisinopril (PRINIVIL,ZESTRIL) 40 MG tablet Take 40 mg by mouth daily.     Marland Kitchen LORazepam (ATIVAN) 1 MG tablet Take 1 mg by mouth at bedtime.    . metoprolol  (LOPRESSOR) 50 MG tablet Take 50 mg by mouth 2 (two) times daily.    . Multiple Vitamins-Minerals (PRESERVISION/LUTEIN PO) Take 1 tablet by mouth 2 (two) times daily.     . Vitamin D, Ergocalciferol, (DRISDOL) 50000 UNITS CAPS capsule Take 1 capsule (50,000 Units total) by mouth every 14 (fourteen) days. 12 capsule 4  . nitroGLYCERIN (NITROSTAT) 0.4 MG SL tablet Place 0.4 mg under the tongue every 5 (five) minutes as needed for chest pain.      No current facility-administered medications for this visit.     Family History  Problem Relation Age of Onset  . Congestive Heart Failure Father   . CVA Father     MI, CAD  . Coronary artery disease Father   . Valvular heart disease Mother   . Hypertension Mother   . Rectal cancer Sister   . Breast cancer Sister   . Breast cancer Maternal Grandmother 90  . Breast cancer Maternal Aunt   . Cancer Sister     ROS:  Pertinent items are noted in HPI.  Otherwise, a comprehensive ROS was negative.  Exam:   BP 112/60 (BP Location: Right Arm, Patient Position: Sitting, Cuff Size: Normal)   Pulse 80   Resp 16   Ht 5\' 7"  (1.702 m)   Wt 191 lb (86.6 kg)   LMP 02/27/2008   BMI 29.91 kg/m   Weight change: +2#   Height: 5\' 7"  (170.2 cm)  Ht Readings from Last 3 Encounters:  01/18/16 5\' 7"  (1.702 m)  10/06/14 5\' 8"  (1.727 m)  09/14/14 5\' 8"  (1.727 m)   General appearance: alert, cooperative and appears stated age Head: Normocephalic, without obvious abnormality, atraumatic Neck: no adenopathy, supple, symmetrical, trachea midline and thyroid normal to inspection and palpation Lungs: clear to auscultation bilaterally Breasts: normal appearance, no masses or tenderness, well healed left breast and axillary scars Heart: regular rate and rhythm Abdomen: soft, non-tender; bowel sounds normal; no masses,  no organomegaly Extremities: extremities normal, atraumatic, no cyanosis or edema Skin: Skin color, texture, turgor normal. No rashes or  lesions Lymph nodes: Cervical, supraclavicular, and axillary nodes normal. No abnormal inguinal nodes palpated Neurologic: Grossly normal  Pelvic: External genitalia:  no lesions              Urethra:  normal appearing urethra with no masses, tenderness or lesions              Bartholins and Skenes: normal                 Vagina: normal appearing vagina with normal color and discharge, slightly whitish appearing lesions to  inferior right aspect of introitus.--this wipes off after several attempts, surrounding atrophic tissue noted              Cervix: no lesions              Pap taken: No. Bimanual Exam:  Uterus:  normal size, contour, position, consistency, mobility, non-tender              Adnexa: normal adnexa and no mass, fullness, tenderness               Rectovaginal: Confirms               Anus:  normal sphincter tone, no lesions  Chaperone was present for exam.  A:  Well Woman with normal exam PMP, no HRT OAB H/O endometrial polyp s/p hysteroscopic resection H/O invasive ductal breast cancer, ER/PR+, treated with lumpectomy and radiation.  Now on Anastrozole for 5 years total  Chronic back pain SOB, long standing Right inferior vulvar lesion?/possible atrophic changes  P: Mammogram yearly. pap smear 2016.  No pap smear today Pt will call if needs any help with cardiology or pulmonology appt.  Declines need for help today Labs/vaccines with Dr. Reynaldo Minium On Vit D 50,000 IU every other week.  No rx needed.  Pt states pharmacy will call when RF needed. Pt is going to use coconut oil daily to vulvar skin as recommended and will return 6 weeks for recheck.  If area still the same, will do vulvar biopsy. return annually or prn

## 2016-01-24 ENCOUNTER — Other Ambulatory Visit: Payer: Self-pay | Admitting: Hematology and Oncology

## 2016-01-24 ENCOUNTER — Ambulatory Visit
Admission: RE | Admit: 2016-01-24 | Discharge: 2016-01-24 | Disposition: A | Discharge status: Home / Self Care | Payer: Medicare Other | Source: Ambulatory Visit | Attending: Hematology and Oncology | Admitting: Hematology and Oncology

## 2016-01-24 DIAGNOSIS — Z853 Personal history of malignant neoplasm of breast: Secondary | ICD-10-CM

## 2016-03-06 ENCOUNTER — Encounter: Payer: Self-pay | Admitting: Obstetrics & Gynecology

## 2016-03-06 ENCOUNTER — Ambulatory Visit (INDEPENDENT_AMBULATORY_CARE_PROVIDER_SITE_OTHER): Payer: Medicare Other | Admitting: Obstetrics & Gynecology

## 2016-03-06 VITALS — BP 98/58 | HR 64 | Resp 12 | Ht 67.0 in | Wt 190.6 lb

## 2016-03-06 DIAGNOSIS — N952 Postmenopausal atrophic vaginitis: Secondary | ICD-10-CM | POA: Diagnosis not present

## 2016-03-06 DIAGNOSIS — R234 Changes in skin texture: Secondary | ICD-10-CM

## 2016-03-06 NOTE — Progress Notes (Signed)
GYNECOLOGY  VISIT   HPI: 76 y.o. G37P2002 Widowed Caucasian female here for recheck after having significant atrophic changes noted on physical exam.  She was having significant pain with wiping with urination.  I felt the anastrozole was likely contributing as well.  She reports this is much better.  Denies vaginal bleeding.  She is using olive oil nightly.  She is surprised how much better she feels.  She had a MMG on 01/24/16.  She had some skin changes that she described at that visit.  MMG recommendations state for breast check with me at this appt.  Pt feels this has completley resolved.    GYNECOLOGIC HISTORY: Patient's last menstrual period was 02/27/2008.   Patient Active Problem List   Diagnosis Date Noted  . Hyperlipemia 11/16/2013  . Primary cancer of lower-inner quadrant of left female breast ([place]) 10/21/2013  . Spinal stenosis of lumbar region L2-5 12/19/2012    Past Medical History:  Diagnosis Date  . Anxiety   . Arthritis   . Back pain    Lower Back pain - takes Cortisone shots  . Bone spur 2009   lower back  . Breast cancer ([place]) 2015   finished radiation 12/15  . Cataract   . Complication of anesthesia    throat irrition from past intubations, intubation granulomas  . Coronary artery disease   . Hyperlipemia   . Hypertension   . MI (myocardial infarction) 12/2010   Was followed by Dr. Pernell Dupre  . Radiation 01/06/14-02/04/14   left breast   . Rosacea 2008   both eyes    Past Surgical History:  Procedure Laterality Date  . BACK SURGERY    . BREAST BIOPSY  8/10   fibrocystic changes  . BREAST LUMPECTOMY WITH NEEDLE LOCALIZATION AND AXILLARY SENTINEL LYMPH NODE BX Left 11/13/2013   Procedure: NEEDLE LOCALIZATION LEFT BREAST LUMPECTOMY WITH LEFT  AXILLARY SENTINEL LYMPH NODE BX;  Surgeon: Excell Seltzer, MD;  Location: [facility];  Service: General;  Laterality: Left;  . CATARACT EXTRACTION     both  . [facility] approx 2007  . heart stent     failed-no stents  . HYSTEROSCOPY  9/10   D&C (polyp)  . LEFT HEART CATHETERIZATION WITH CORONARY ANGIOGRAM N/A 01/18/2011   Procedure: LEFT HEART CATHETERIZATION WITH CORONARY ANGIOGRAM;  Surgeon: Clent Demark, MD;  Location: [facility] CATH LAB;  Service: Cardiovascular;  Laterality: N/A;  . PERCUTANEOUS CORONARY STENT INTERVENTION (PCI-S) N/A 01/18/2011   Procedure: PERCUTANEOUS CORONARY STENT INTERVENTION (PCI-S);  Surgeon: Clent Demark, MD;  Location[facility] CATH LAB;  Service: Cardiovascular;  Laterality: N/A;  . TONSILLECTOMY AND ADENOIDECTOMY    . TUBAL LIGATION      MEDS:  Reviewed in EPIC and UTD  ALLERGIES: Dilaudid [hydromorphone hcl] and Penicillins  Family History  Problem Relation Age of Onset  . Congestive Heart Failure Father   . CVA Father     MI, CAD  . Coronary artery disease Father   . Valvular heart disease Mother   . Hypertension Mother   . Rectal cancer Sister   . Breast cancer Sister   . Breast cancer Maternal Grandmother 90  . Breast cancer Maternal Aunt   . Cancer Sister     SH:  Widowed, non smoker  Review of Systems  All other systems reviewed and are negative.   PHYSICAL EXAMINATION:    BP (!) 98/58 (BP Location: Right Arm, Patient Position:  Sitting, Cuff Size: Normal)   Pulse 64   Resp 12   Ht 5\' 7"  (1.702 m)   Wt 190 lb 9.6 oz (86.5 kg)   LMP 02/27/2008   BMI 29.85 kg/m     General appearance: alert, cooperative and appears stated age Neck: no adenopathy, supple, symmetrical, trachea Breasts: normal appearance, no masses or tenderness, no skin abnormalities noted  Pelvic: External genitalia:  no lesions              Urethra:  normal appearing urethra with no masses, tenderness or lesions              Bartholins and Skenes: normal                 Vagina:  Atrophic changes notes.  Area of whitish skin changes are much improved with just olive oil.  No bleeding or lesions are noted.                Anus:   no lesions  Assessment: Atrophic changes that are much improved with olive oil use Flaky skin right breast that has completely resolved H/O left breast cancer, on Anastrazole  Plan: Can decreased olive oil use to twice weekly Pt reassured about normal appearing breast skin.  Do not feel additional imaging or dermatology is needed.

## 2016-03-29 ENCOUNTER — Other Ambulatory Visit: Payer: Self-pay | Admitting: Obstetrics & Gynecology

## 2016-03-29 NOTE — Telephone Encounter (Signed)
Medication refill request: Vit D Last AEX:  01/18/16 SM Next AEX: 04/30/17 SM Last MMG (if hormonal medication request): 01/24/16 [place] Right BIRADS2:benign  Refill authorized: 06/22/14 #[id number]. Today please advise.

## 2016-05-01 ENCOUNTER — Ambulatory Visit (HOSPITAL_BASED_OUTPATIENT_CLINIC_OR_DEPARTMENT_OTHER): Payer: Medicare Other | Admitting: Hematology and Oncology

## 2016-05-01 ENCOUNTER — Encounter: Payer: Self-pay | Admitting: Hematology and Oncology

## 2016-05-01 DIAGNOSIS — Z170 Estrogen receptor positive status [ER+]: Secondary | ICD-10-CM

## 2016-05-01 DIAGNOSIS — Z79811 Long term (current) use of aromatase inhibitors: Secondary | ICD-10-CM

## 2016-05-01 DIAGNOSIS — C50312 Malignant neoplasm of lower-inner quadrant of left female breast: Secondary | ICD-10-CM

## 2016-05-01 NOTE — Assessment & Plan Note (Signed)
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 Occasional hot flashes  Breast Cancer Surveillance: 1. Breast exam 05/01/2016: Normal 2. Mammogram: 01/24/2016: Stable postsurgical changes in the left breast, no dominant masses or calcifications. The right nipple appears to be dry with minimal desquamation targeted ultrasound did not reveal any suspicious lesions, consider nipple punch biopsy breast density category B   return to clinic in 1 year for follow-up

## 2016-05-01 NOTE — Progress Notes (Signed)
Patient Care Team: Burnard Bunting, MD as PCP - General (Internal Medicine)  DIAGNOSIS:  Encounter Diagnosis  Name Primary?  . Primary cancer of lower-inner quadrant of left female breast ([place])     SUMMARY OF ONCOLOGIC HISTORY:   Primary cancer of lower-inner quadrant of left female breast ([place])   11/13/2013 Surgery    Left breast lumpectomy: Invasive ductal carcinoma 1.4 cm with DCIS, 1 SLN negative T1 C. N0 M0 stage IA ER 100% PR 98% Ki-67 6% HER-2 negative ratio 1.56      01/04/2014 - 02/05/2014 Radiation Therapy    Left breast 42.72 gray in 16 fractions; lumpectomy cavity boost 10 gray in 5 fractions, cumulative dose to the lumpectomy cavity 52.72 gray      02/26/2014 -  Anti-estrogen oral therapy    Anastrazole 1 mg daily       CHIEF COMPLIANT:  Follow up on Anastrozole therapy  INTERVAL HISTORY: Rachel Chase is a 76 year old with above-mentioned history of left breast cancer who was treated with lumpectomy followed by radiation and is currently on anastrozole therapy since January 2016. She is tolerating anastrozole moderately well. She does have intermittent hot flashes and vaginal dryness. She is using Coconut oil for the vaginal dryness and it appears to be working well. She denies any lumps or nodules in the breast. She was noted to have right nipple crusting which was noted by the radiologist and recommended follow-up.  REVIEW OF SYSTEMS:   Constitutional: Denies fevers, chills or abnormal weight loss Eyes: Denies blurriness of vision Ears, nose, mouth, throat, and face: Denies mucositis or sore throat Respiratory: Denies cough, dyspnea or wheezes Cardiovascular: Denies palpitation, chest discomfort Gastrointestinal:  Denies nausea, heartburn or change in bowel habits Skin: Denies abnormal skin rashes Lymphatics: Denies new lymphadenopathy or easy bruising Neurological:Denies numbness, tingling or new weaknesses Behavioral/Psych: Mood is stable, no new changes    Extremities: No lower extremity edema Breast:  denies any pain or lumps or nodules in either breasts All other systems were reviewed with the patient and are negative.  I have reviewed the past medical history, past surgical history, social history and family history with the patient and they are unchanged from previous note.  ALLERGIES:  is allergic to dilaudid [hydromorphone hcl] and penicillins.  MEDICATIONS:  Current Outpatient Prescriptions  Medication Sig Dispense Refill  . albuterol (PROVENTIL HFA;VENTOLIN HFA) 108 (90 BASE) MCG/ACT inhaler Inhale 2 puffs into the lungs daily as needed for wheezing or shortness of breath.    . anastrozole (ARIMIDEX) 1 MG tablet Take 1 tablet (1 mg total) by mouth daily. 90 tablet 3  . Artificial Tear Solution (SOOTHE HYDRATION OP) Place 1 drop into both eyes daily as needed (dry eyes).    Marland Kitchen aspirin EC 81 MG tablet Take 81 mg by mouth daily with supper.    Marland Kitchen atorvastatin (LIPITOR) 40 MG tablet Take 40 mg by mouth at bedtime.     . hydrochlorothiazide (HYDRODIURIL) 25 MG tablet Take 25 mg by mouth daily.     Marland Kitchen HYDROcodone-acetaminophen (NORCO/VICODIN) 5-325 MG per tablet Take 1 tablet by mouth daily. 1/2 tab in am and 1/2 tab night    . lisinopril (PRINIVIL,ZESTRIL) 40 MG tablet Take 40 mg by mouth daily.     Marland Kitchen LORazepam (ATIVAN) 1 MG tablet Take 1 mg by mouth at bedtime.    . metoprolol (LOPRESSOR) 50 MG tablet Take 50 mg by mouth 2 (two) times daily.    . Multiple Vitamins-Minerals (PRESERVISION/LUTEIN PO)  Take 1 tablet by mouth 2 (two) times daily.     . nitroGLYCERIN (NITROSTAT) 0.4 MG SL tablet Place 0.4 mg under the tongue every 5 (five) minutes as needed for chest pain.     . Vitamin D, Ergocalciferol, (DRISDOL) 50000 units CAPS capsule TAKE ONE CAPSULE (50 000 UNITS) BY MOUTHEVERY 14 DAYS 12 capsule 1   No current facility-administered medications for this visit.     PHYSICAL EXAMINATION: ECOG PERFORMANCE STATUS: 1 - Symptomatic but  completely ambulatory  Vitals:   05/01/16 1041  BP: 128/60  Pulse: 72  Resp: 17  Temp: 97.5 F (36.4 C)   Filed Weights   05/01/16 1041  Weight: 194 lb 1.6 oz (88 kg)    GENERAL:alert, no distress and comfortable SKIN: skin color, texture, turgor are normal, no rashes or significant lesions EYES: normal, Conjunctiva are pink and non-injected, sclera clear OROPHARYNX:no exudate, no erythema and lips, buccal mucosa, and tongue normal  NECK: supple, thyroid normal size, non-tender, without nodularity LYMPH:  no palpable lymphadenopathy in the cervical, axillary or inguinal LUNGS: clear to auscultation and percussion with normal breathing effort HEART: regular rate & rhythm and no murmurs and no lower extremity edema ABDOMEN:abdomen soft, non-tender and normal bowel sounds MUSCULOSKELETAL:no cyanosis of digits and no clubbing  NEURO: alert & oriented x 3 with fluent speech, no focal motor/sensory deficits EXTREMITIES: No lower extremity edema BREAST: No palpable masses or nodules in either right or left breasts. Whitish flaking or crusting around the right nipple. No palpable axillary supraclavicular or infraclavicular adenopathy no breast tenderness or nipple discharge. (exam performed in the presence of a chaperone)  LABORATORY DATA:  I have reviewed the data as listed   Chemistry      Component Value Date/Time   NA 142 11/09/2013 1330   K 4.1 11/09/2013 1330   CL 100 11/09/2013 1330   CO2 27 11/09/2013 1330   BUN 16 11/09/2013 1330   CREATININE 0.78 11/09/2013 1330      Component Value Date/Time   CALCIUM 9.3 11/09/2013 1330   ALKPHOS 67 01/18/2011 1620   AST 142 (H) 01/18/2011 1620   ALT 156 (H) 01/18/2011 1620   BILITOT 0.3 01/18/2011 1620       Lab Results  Component Value Date   WBC 8.8 01/25/2014   HGB 14.7 01/25/2014   HCT 45.6 01/25/2014   MCV 94.0 01/25/2014   PLT 175 12/19/2012   NEUTROABS 7.9 (H) 01/29/2011    ASSESSMENT & PLAN:  Primary cancer  of lower-inner quadrant of left female breast 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 Occasional hot flashes  Breast Cancer Surveillance: 1. Breast exam 05/01/2016: Whitish flaking or crusting around the right nipple 2. Mammogram: 01/24/2016: Stable postsurgical changes in the left breast, no dominant masses or calcifications. The right nipple appears to be dry with minimal desquamation targeted ultrasound did not reveal any suspicious lesions, consider nipple punch biopsy breast density category B   return to clinic in 1 year for follow-up   I spent 15 minutes talking to the patient of which more than half was spent in counseling and coordination of care.  No orders of the defined types were placed in this encounter.  The  patient has a good understanding of the overall plan. she agrees with it. she will call with any problems that may develop before the next visit here.   Rulon Eisenmenger, MD 05/01/16

## 2016-05-14 ENCOUNTER — Other Ambulatory Visit: Payer: Self-pay

## 2016-05-14 DIAGNOSIS — C50312 Malignant neoplasm of lower-inner quadrant of left female breast: Secondary | ICD-10-CM

## 2016-05-14 MED ORDER — ANASTROZOLE 1 MG PO TABS: 1.0000 mg | ORAL_TABLET | Freq: Every day | ORAL | 3 refills | Status: DC

## 2016-05-17 ENCOUNTER — Other Ambulatory Visit: Payer: Self-pay | Admitting: Internal Medicine

## 2016-05-17 DIAGNOSIS — R918 Other nonspecific abnormal finding of lung field: Secondary | ICD-10-CM

## 2016-05-22 ENCOUNTER — Ambulatory Visit
Admission: RE | Admit: 2016-05-22 | Discharge: 2016-05-22 | Disposition: A | Discharge status: Home / Self Care | Payer: Medicare Other | Source: Ambulatory Visit | Attending: Internal Medicine | Admitting: Internal Medicine

## 2016-05-22 DIAGNOSIS — R918 Other nonspecific abnormal finding of lung field: Secondary | ICD-10-CM

## 2016-05-22 MED ORDER — IOPAMIDOL (ISOVUE-300) INJECTION 61%
75.0000 mL | Freq: Once | INTRAVENOUS | Status: AC | PRN
Start: 2016-05-22 — End: 2016-05-22
  Administered 2016-05-22: 75 mL via INTRAVENOUS

## 2016-05-28 ENCOUNTER — Other Ambulatory Visit (HOSPITAL_COMMUNITY): Payer: Self-pay | Admitting: Internal Medicine

## 2016-05-28 DIAGNOSIS — R911 Solitary pulmonary nodule: Secondary | ICD-10-CM

## 2016-06-05 ENCOUNTER — Telehealth: Payer: Self-pay | Admitting: Hematology and Oncology

## 2016-06-05 NOTE — Telephone Encounter (Signed)
sw pt to confirm 4/17 appt at 1130 per call from [facility] at [facility]

## 2016-06-12 ENCOUNTER — Encounter: Payer: Self-pay | Admitting: Hematology and Oncology

## 2016-06-12 ENCOUNTER — Ambulatory Visit (HOSPITAL_BASED_OUTPATIENT_CLINIC_OR_DEPARTMENT_OTHER): Payer: Medicare Other | Admitting: Hematology and Oncology

## 2016-06-12 DIAGNOSIS — Z79811 Long term (current) use of aromatase inhibitors: Secondary | ICD-10-CM | POA: Diagnosis not present

## 2016-06-12 DIAGNOSIS — R911 Solitary pulmonary nodule: Secondary | ICD-10-CM

## 2016-06-12 DIAGNOSIS — C50312 Malignant neoplasm of lower-inner quadrant of left female breast: Secondary | ICD-10-CM | POA: Diagnosis not present

## 2016-06-12 NOTE — Assessment & Plan Note (Signed)
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 Occasional hot flashes  Breast Cancer Surveillance: 1. Breast exam 05/01/2016: Whitish flaking or crusting around the right nipple 2. Mammogram: 01/24/2016: Stable postsurgical changes in the left breast, no dominant masses or calcifications. The right nipple appears to be dry with minimal desquamation targeted ultrasound did not reveal any suspicious lesions, consider nipple punch biopsy breast density category B 3. CT chest 05/22/2016: Enlarging left lower lobe nodule 17 x 17 mm (increased in size from CT scan from 2009)  Enlarging lung nodule: Will obtain a PET/CT scan and will refer the patient for cardiothoracic surgery for further evaluation.  return to clinicfor follow-up after the scans

## 2016-06-12 NOTE — Progress Notes (Signed)
Patient Care Team: Burnard Bunting, MD as PCP - General (Internal Medicine)  DIAGNOSIS:  Encounter Diagnoses  Name Primary?  . Primary cancer of lower-inner quadrant of left female breast ([place])   . Lung nodule, solitary     SUMMARY OF ONCOLOGIC HISTORY:   Primary cancer of lower-inner quadrant of left female breast ([place])   11/13/2013 Surgery    Left breast lumpectomy: Invasive ductal carcinoma 1.4 cm with DCIS, 1 SLN negative T1 C. N0 M0 stage IA ER 100% PR 98% Ki-67 6% HER-2 negative ratio 1.56      01/04/2014 - 02/05/2014 Radiation Therapy    Left breast 42.72 gray in 16 fractions; lumpectomy cavity boost 10 gray in 5 fractions, cumulative dose to the lumpectomy cavity 52.72 gray      02/26/2014 -  Anti-estrogen oral therapy    Anastrazole 1 mg daily       CHIEF COMPLIANT: Patient is here for evaluation of lung nodule  INTERVAL HISTORY: Rachel Chase is a 76 year old lady wearing well known to me with left breast cancer treated with lumpectomy and radiation and is currently doing very well on anastrozole therapy. She was complaining of respiratory symptoms and she underwent a CT of the chest that revealed a 1.9 cm lung nodule in the left lung. Dr. Reynaldo Minium scheduled her to undergo CT-guided biopsy but has not been done yet. She is here today to discuss treatment options.  REVIEW OF SYSTEMS:   Constitutional: Denies fevers, chills or abnormal weight loss Eyes: Denies blurriness of vision Ears, nose, mouth, throat, and face: Denies mucositis or sore throat Respiratory: Chronic shortness of breath related to prior smoking history as well as cardiac problems , patient had a stent placement Cardiovascular: Denies palpitation, chest discomfort Gastrointestinal:  Denies nausea, heartburn or change in bowel habits Skin: Denies abnormal skin rashes Lymphatics: Denies new lymphadenopathy or easy bruising Neurological:Denies numbness, tingling or new weaknesses Behavioral/Psych: Mood  is stable, no new changes  Extremities: No lower extremity edema Breast:  denies any pain or lumps or nodules in either breasts All other systems were reviewed with the patient and are negative.  I have reviewed the past medical history, past surgical history, social history and family history with the patient and they are unchanged from previous note.  ALLERGIES:  is allergic to dilaudid [hydromorphone hcl] and penicillins.  MEDICATIONS:  Current Outpatient Prescriptions  Medication Sig Dispense Refill  . albuterol (PROVENTIL HFA;VENTOLIN HFA) 108 (90 BASE) MCG/ACT inhaler Inhale 2 puffs into the lungs daily as needed for wheezing or shortness of breath.    . anastrozole (ARIMIDEX) 1 MG tablet Take 1 tablet (1 mg total) by mouth daily. 90 tablet 3  . Artificial Tear Solution (SOOTHE HYDRATION OP) Place 1 drop into both eyes daily as needed (dry eyes).    Marland Kitchen aspirin EC 81 MG tablet Take 81 mg by mouth daily with supper.    Marland Kitchen atorvastatin (LIPITOR) 40 MG tablet Take 40 mg by mouth at bedtime.     . hydrochlorothiazide (HYDRODIURIL) 25 MG tablet Take 25 mg by mouth daily.     Marland Kitchen HYDROcodone-acetaminophen (NORCO/VICODIN) 5-325 MG per tablet Take 1 tablet by mouth daily. 1/2 tab in am and 1/2 tab night    . lisinopril (PRINIVIL,ZESTRIL) 40 MG tablet Take 40 mg by mouth daily.     Marland Kitchen LORazepam (ATIVAN) 1 MG tablet Take 1 mg by mouth at bedtime.    . metoprolol (LOPRESSOR) 50 MG tablet Take 50 mg by mouth  2 (two) times daily.    . Multiple Vitamins-Minerals (PRESERVISION/LUTEIN PO) Take 1 tablet by mouth 2 (two) times daily.     . nitroGLYCERIN (NITROSTAT) 0.4 MG SL tablet Place 0.4 mg under the tongue every 5 (five) minutes as needed for chest pain.     . Vitamin D, Ergocalciferol, (DRISDOL) 50000 units CAPS capsule TAKE ONE CAPSULE (50 000 UNITS) BY MOUTHEVERY 14 DAYS 12 capsule 1   No current facility-administered medications for this visit.     PHYSICAL EXAMINATION: ECOG PERFORMANCE STATUS:  1 - Symptomatic but completely ambulatory  Vitals:   06/12/16 1211  BP: (!) 136/55  Pulse: 65  Resp: 18  Temp: 97.8 F (36.6 C)   Filed Weights   06/12/16 1211  Weight: 192 lb 3.2 oz (87.2 kg)    GENERAL:alert, no distress and comfortable SKIN: skin color, texture, turgor are normal, no rashes or significant lesions EYES: normal, Conjunctiva are pink and non-injected, sclera clear OROPHARYNX:no exudate, no erythema and lips, buccal mucosa, and tongue normal  NECK: supple, thyroid normal size, non-tender, without nodularity LYMPH:  no palpable lymphadenopathy in the cervical, axillary or inguinal LUNGS: clear to auscultation and percussion with normal breathing effort HEART: regular rate & rhythm and no murmurs and no lower extremity edema ABDOMEN:abdomen soft, non-tender and normal bowel sounds MUSCULOSKELETAL:no cyanosis of digits and no clubbing  NEURO: alert & oriented x 3 with fluent speech, no focal motor/sensory deficits EXTREMITIES: No lower extremity edema   LABORATORY DATA:  I have reviewed the data as listed   Chemistry      Component Value Date/Time   NA 142 11/09/2013 1330   K 4.1 11/09/2013 1330   CL 100 11/09/2013 1330   CO2 27 11/09/2013 1330   BUN 16 11/09/2013 1330   CREATININE 0.78 11/09/2013 1330      Component Value Date/Time   CALCIUM 9.3 11/09/2013 1330   ALKPHOS 67 01/18/2011 1620   AST 142 (H) 01/18/2011 1620   ALT 156 (H) 01/18/2011 1620   BILITOT 0.3 01/18/2011 1620       Lab Results  Component Value Date   WBC 8.8 01/25/2014   HGB 14.7 01/25/2014   HCT 45.6 01/25/2014   MCV 94.0 01/25/2014   PLT 175 12/19/2012   NEUTROABS 7.9 (H) 01/29/2011    ASSESSMENT & PLAN:  Primary cancer of lower-inner quadrant of left female breast 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 Occasional hot flashes  Breast Cancer Surveillance: 1. Breast exam 05/01/2016: Whitish flaking or crusting around the right nipple 2. Mammogram: 01/24/2016: Stable postsurgical changes in the left breast, no dominant masses or calcifications. The right nipple appears to be dry with minimal desquamation targeted ultrasound did not reveal any suspicious lesions, consider nipple punch biopsy breast density category B 3. CT chest 05/22/2016: Enlarging left lower lobe nodule 17 x 17 mm (increased in size from CT scan from 2009)  Lung nodule, solitary Patient had a previous lung nodule in 2006 measuring 9 mm. It has increased to 17 mm Patient was initially scheduled to undergo CT-guided lung biopsy. I called interventional radiology and found out that this biopsy has not been scheduled. Dr. Earleen Newport with interventional radiology and suggested that the  patient presented of the thoracic tumor board. I called thoracic navigator and found out that the patient was not on the list. We will try to add her onto the list so that the next steps can be taken. The 2 options are CT-guided biopsy versus wedge resection. Patient understands his options I will be getting back to her as soon as I hear the result from the tumor board discussion.   I spent 25 minutes talking to the patient of which more than half was spent in counseling and coordination of care.  No orders of the defined types were placed in this encounter.  The patient has a good understanding of the overall plan. she agrees with it. she will call with any problems that may develop before the next visit here.   Rulon Eisenmenger, MD 06/12/16

## 2016-06-12 NOTE — Assessment & Plan Note (Signed)
Patient was initially scheduled to undergo CT-guided lung biopsy. I called interventional radiology and found out that this biopsy has not been scheduled. Dr. Earleen Newport with interventional radiology and suggested that the patient presented of the thoracic tumor board. I called thoracic navigator and found out that the patient was not on the list. We will try to add her onto the list so that the next steps can be taken. The 2 options are CT-guided biopsy versus wedge resection. Patient understands his options I will be getting back to her as soon as I hear the result from the tumor board discussion.

## 2016-06-21 ENCOUNTER — Telehealth: Payer: Self-pay | Admitting: *Deleted

## 2016-06-21 DIAGNOSIS — R911 Solitary pulmonary nodule: Secondary | ICD-10-CM

## 2016-06-21 NOTE — Telephone Encounter (Signed)
Oncology Nurse Navigator Documentation  Oncology Nurse Navigator Flowsheets 06/21/2016  Navigator Location [facility]  Navigator Encounter Type Telephone;Other/per Dr. Lindi Adie, patient's scans were discussed at thoracic cancer conference this am.  I updated Dr. Lindi Adie on discussion, orders received.  I notified [facility] to convert CT Chest to CT Super D.  I was told this will be done.  Per Dr. Lindi Adie, I will order PET scan.  I called Ms. Sallade to update but was unable to reach.  I left vm message with my name and phone number to call. I will also updated Dr. Everrett Coombe office that patient needs to be seen after PET scan per cancer conference discussion.   Abnormal Finding Date 05/22/2016  Barriers/Navigation Needs Coordination of Care;Education  Education Other  Interventions Coordination of Care;Education  Coordination of Care Appts;Other  Education Method Verbal  Acuity Level 3  Acuity Level 3 Coordination of multimodality treatment  Time Spent with Patient 60

## 2016-06-21 NOTE — Telephone Encounter (Signed)
Oncology Nurse Navigator Documentation  Oncology Nurse Navigator Flowsheets 06/21/2016  Navigator Location [facility]  Navigator Encounter Type Telephone/Ms. Reif called me and left vm message, I called her back. I updated her on cancer conference discussion with Dr. Lindi Adie, and next steps.  She is scheduled for PET scan on 06/29/16.  She is aware that Dr. Everrett Coombe office will be calling to schedule her to be seen with him.  She was thankful for the update.   Telephone Outgoing Call;Incoming Call  [employer];Coordination of Care  Education Other  Interventions Education;Coordination of Care  Coordination of Care Other  Education Method Verbal  Acuity Level 2  Acuity Level 1 Minimal follow up required  Time Spent with Patient 30

## 2016-06-25 ENCOUNTER — Telehealth: Payer: Self-pay | Admitting: *Deleted

## 2016-06-25 ENCOUNTER — Encounter: Payer: Self-pay | Admitting: *Deleted

## 2016-06-25 DIAGNOSIS — R911 Solitary pulmonary nodule: Secondary | ICD-10-CM

## 2016-06-25 NOTE — Progress Notes (Signed)
Oncology Nurse Navigator Documentation  Oncology Nurse Navigator Flowsheets 06/25/2016  Navigator Location [facility]  Navigator Encounter Type Other/Per Dr. Servando Snare, PFT's have been ordered and scheduled. I will update patient on appt and pre-procedure instructions.  I also followed up with [facility] to see if CT has been formatted to CD Super D.  I was told by imaging supervisor that it has been formatted and at [place] office.  I then called Ebony Hail at [place] and updated. Dr. Servando Snare also updated.  Treatment Phase Abnormal Scans  Barriers/Navigation Needs Coordination of Care  Interventions Coordination of Care  Coordination of Care Appts;Other  Acuity Level 2  Acuity Level 2 Assistance expediting appointments;Other  Time Spent with Patient 30

## 2016-06-25 NOTE — Telephone Encounter (Signed)
Oncology Nurse Navigator Documentation  Oncology Nurse Navigator Flowsheets 06/25/2016  Navigator Location [facility]  Navigator Encounter Type Telephone/I called Rachel Chase and update her on appt for PFT's.  I explained the test and pre-procedure instructions.  She was thankful for the call and update.   Telephone Outgoing Call  Treatment Phase Abnormal Scans  Barriers/Navigation Needs Education  Education Other  Interventions Education  Acuity Level 1  Acuity Level 1 Minimal follow up required  Time Spent with Patient 15

## 2016-06-28 ENCOUNTER — Encounter (HOSPITAL_COMMUNITY): Payer: Medicare Other

## 2016-06-29 ENCOUNTER — Encounter (HOSPITAL_COMMUNITY): Payer: Medicare Other

## 2016-06-29 ENCOUNTER — Ambulatory Visit (HOSPITAL_COMMUNITY)
Admission: RE | Admit: 2016-06-29 | Discharge: 2016-06-29 | Disposition: A | Discharge status: Home / Self Care | Payer: Medicare Other | Source: Ambulatory Visit | Attending: Cardiothoracic Surgery | Admitting: Cardiothoracic Surgery

## 2016-06-29 ENCOUNTER — Ambulatory Visit (HOSPITAL_COMMUNITY)
Admission: RE | Admit: 2016-06-29 | Discharge: 2016-06-29 | Disposition: A | Discharge status: Home / Self Care | Payer: Medicare Other | Source: Ambulatory Visit | Attending: Hematology and Oncology | Admitting: Hematology and Oncology

## 2016-06-29 DIAGNOSIS — R911 Solitary pulmonary nodule: Secondary | ICD-10-CM | POA: Insufficient documentation

## 2016-06-29 LAB — PULMONARY FUNCTION TEST
DL/VA % pred: 51 %
DL/VA: 2.72 ml/min/mmHg/L
DLCO unc % pred: 30 %
DLCO unc: 9.17 ml/min/mmHg
FEF 25-75 Post: 0.65 L/sec
FEF 25-75 Pre: 0.34 L/sec
FEF2575-%Change-Post: 89 %
FEF2575-%Pred-Post: 34 %
FEF2575-%Pred-Pre: 18 %
FEV1-%Change-Post: 28 %
FEV1-%Pred-Post: 45 %
FEV1-%Pred-Pre: 35 %
FEV1-Post: 1.15 L
FEV1-Pre: 0.89 L
FEV1FVC-%Change-Post: -1 %
FEV1FVC-%Pred-Pre: 69 %
FEV6-%Change-Post: 23 %
FEV6-%Pred-Post: 62 %
FEV6-%Pred-Pre: 50 %
FEV6-Post: 2 L
FEV6-Pre: 1.62 L
FEV6FVC-%Change-Post: -5 %
FEV6FVC-%Pred-Post: 94 %
FEV6FVC-%Pred-Pre: 100 %
FVC-%Change-Post: 30 %
FVC-%Pred-Post: 66 %
FVC-%Pred-Pre: 51 %
FVC-Post: 2.22 L
FVC-Pre: 1.7 L
Post FEV1/FVC ratio: 52 %
Post FEV6/FVC ratio: 90 %
Pre FEV1/FVC ratio: 52 %
Pre FEV6/FVC Ratio: 95 %
RV % pred: 170 %
RV: 4.32 L
TLC % pred: 106 %
TLC: 6.08 L

## 2016-06-29 LAB — GLUCOSE, CAPILLARY: Glucose-Capillary: 110 mg/dL — ABNORMAL HIGH (ref 65–99)

## 2016-06-29 MED ORDER — FLUDEOXYGLUCOSE F - 18 (FDG) INJECTION: 9.6000 | Freq: Once | INTRAVENOUS | Status: DC | PRN

## 2016-06-29 MED ORDER — ALBUTEROL SULFATE (2.5 MG/3ML) 0.083% IN NEBU
2.5000 mg | INHALATION_SOLUTION | Freq: Once | RESPIRATORY_TRACT | Status: AC
  Administered 2016-06-29: 2.5 mg via RESPIRATORY_TRACT

## 2016-07-03 ENCOUNTER — Encounter: Payer: Self-pay | Admitting: Cardiothoracic Surgery

## 2016-07-03 ENCOUNTER — Institutional Professional Consult (permissible substitution) (INDEPENDENT_AMBULATORY_CARE_PROVIDER_SITE_OTHER): Payer: Medicare Other | Admitting: Cardiothoracic Surgery

## 2016-07-03 VITALS — BP 127/80 | HR 80 | Resp 20 | Ht 67.0 in | Wt 192.0 lb

## 2016-07-03 DIAGNOSIS — Z853 Personal history of malignant neoplasm of breast: Secondary | ICD-10-CM | POA: Diagnosis not present

## 2016-07-03 DIAGNOSIS — R911 Solitary pulmonary nodule: Secondary | ICD-10-CM

## 2016-07-03 NOTE — Progress Notes (Signed)
[address]       [place] [address]             [phone]                    Kay W Hassey [place] Medical Record #[id number] Date of Birth: 01/10/1941  Referring: Burnard Bunting, MD Primary Care: Burnard Bunting, MD  Chief Complaint:    Chief Complaint  Patient presents with  . Lung Lesion    Surgical eval, PET Scan 06/29/16, PFT's 06/29/16, Chest CT 05/22/16, HX of left breast cancer    History of Present Illness:    Rachel Chase 76 y.o. female is seen in the office  today for Left lower lobe lung nodule. The first saw the patient in January 2006 for a 7 mm left lower lobe pulmonary nodule that was evident on a film the year before that. She was followed for several years with minimal change in lesion since that time the patient had acute at-home cardiac arrest thanksgiving 5 years ago no stents were placed she's currently followed by Dr. Daneen Schick for coronary artery disease. 2 years ago she was diagnosed with left breast cancer treated with lumpectomy no dust node sampling and radiation therapy she's now on anti-estrogen therapy.  Main complaint recently has been increasing shortness of breath especially with exercise. She likes to shaggy dancing notes that she is unable to complete a full dance without become short of breath. She denies specific anginal symptoms. Because of the shortness of breath a CT scan of the chest was done leading to her referral to thoracic surgery.   Pulmonary function studies CT of the chest and PET scan are reviewed. Since first seen the patient had continued deterioration in her pulmonary function studies in 2006 her FEV1 was measured at 1.28 now her FEV1 is  .89  35 % of predicted and a diffusion capacity of 30% dictated she does have significant improvement with bronchodilator therapy .    Current Activity/ Functional Status:  Patient is independent with mobility/ambulation, transfers, ADL's, IADL's.   Zubrod  Score: At the time of surgery this patient's most appropriate activity status/level should be described as: []     0    Normal activity, no symptoms [x]     1    Restricted in physical strenuous activity but ambulatory, able to do out light work []     2    Ambulatory and capable of self care, unable to do work activities, up and about               >50 % of waking hours                              []     3    Only limited self care, in bed greater than 50% of waking hours []     4    Completely disabled, no self care, confined to bed or chair []     5    Moribund   Past Medical History:  Diagnosis Date  . Anxiety   . Arthritis   . Back pain    Lower Back pain - takes Cortisone shots  . Bone spur 2009   lower back  . Breast cancer ([place]) 2015   finished radiation 12/15  . Cataract   . Complication of anesthesia    throat irrition from past intubations,  intubation granulomas  . Coronary artery disease   . Hyperlipemia   . Hypertension   . MI (myocardial infarction) ([facility] [place]) 12/2010   Was followed by Dr. Pernell Dupre  . Radiation 01/06/14-02/04/14   left breast   . Rosacea 2008   both eyes    Past Surgical History:  Procedure Laterality Date  . BACK SURGERY    . BREAST BIOPSY  8/10   fibrocystic changes  . BREAST LUMPECTOMY WITH NEEDLE LOCALIZATION AND AXILLARY SENTINEL LYMPH NODE BX Left 11/13/2013   Procedure: NEEDLE LOCALIZATION LEFT BREAST LUMPECTOMY WITH LEFT  AXILLARY SENTINEL LYMPH NODE BX;  Surgeon: Excell Seltzer, MD;  Location: [facility];  Service: General;  Laterality: Left;  . CATARACT EXTRACTION     both  . [facility] approx 2007  . heart stent     failed-no stents  . HYSTEROSCOPY  9/10   D&C (polyp)  . LEFT HEART CATHETERIZATION WITH CORONARY ANGIOGRAM N/A 01/18/2011   Procedure: LEFT HEART CATHETERIZATION WITH CORONARY ANGIOGRAM;  Surgeon: Clent Demark, MD;  Location: [facility] CATH LAB;  Service: Cardiovascular;  Laterality:  N/A;  . PERCUTANEOUS CORONARY STENT INTERVENTION (PCI-S) N/A 01/18/2011   Procedure: PERCUTANEOUS CORONARY STENT INTERVENTION (PCI-S);  Surgeon: Clent Demark, MD;  Location: [facility] CATH LAB;  Service: Cardiovascular;  Laterality: N/A;  . TONSILLECTOMY AND ADENOIDECTOMY    . TUBAL LIGATION      Family History  Problem Relation Age of Onset  . Congestive Heart Failure Father   . CVA Father     MI, CAD  . Coronary artery disease Father   . Valvular heart disease Mother   . Hypertension Mother   . Rectal cancer Sister   . Breast cancer Sister   . Breast cancer Maternal Grandmother 90  . Breast cancer Maternal Aunt   . Cancer Sister     Social History   Social History  . Marital status: Widowed    Spouse name: N/A  . Number of children: 3  . Years of education: N/A   Occupational History  . Not on file.   Social History Main Topics  . Smoking status: Former Smoker    Packs/day: 1.50    Years: 45.00    Types: Cigarettes    Quit date: 04/17/1998  . Smokeless tobacco: Current User     Comment: nicorette gum  . Alcohol use 0.6 - 1.2 oz/week    1 - 2 Standard drinks or equivalent per week     Comment: occ  . Drug use: No  . Sexual activity: No   Other Topics Concern  . Not on file   Social History Narrative  . No narrative on file    History  Smoking Status  . Former Smoker  . Packs/day: 1.50  . Years: 45.00  . Types: Cigarettes  . Quit date: 04/17/1998  Smokeless Tobacco  . Current User    Comment: nicorette gum    History  Alcohol Use  . 0.6 - 1.2 oz/week  . 1 - 2 Standard drinks or equivalent per week    Comment: occ     Allergies  Allergen Reactions  . Dilaudid [Hydromorphone Hcl] Nausea And Vomiting  . Penicillins Rash    Current Outpatient Prescriptions  Medication Sig Dispense Refill  . albuterol (PROVENTIL HFA;VENTOLIN HFA) 108 (90 BASE) MCG/ACT inhaler Inhale 2 puffs into the lungs daily as needed for wheezing or shortness of breath.    Marland Kitchen  anastrozole (ARIMIDEX) 1 MG tablet Take 1 tablet (1 mg total) by mouth daily. 90 tablet 3  . Artificial Tear Solution (SOOTHE HYDRATION OP) Place 1 drop into both eyes daily as needed (dry eyes).    Marland Kitchen aspirin EC 81 MG tablet Take 81 mg by mouth daily with supper.    Marland Kitchen atorvastatin (LIPITOR) 40 MG tablet Take 40 mg by mouth at bedtime.     . hydrochlorothiazide (HYDRODIURIL) 25 MG tablet Take 25 mg by mouth daily.     Marland Kitchen HYDROcodone-acetaminophen (NORCO/VICODIN) 5-325 MG per tablet Take 1 tablet by mouth daily. 1/2 tab in am and 1/2 tab night    . lisinopril (PRINIVIL,ZESTRIL) 40 MG tablet Take 40 mg by mouth daily.     Marland Kitchen LORazepam (ATIVAN) 1 MG tablet Take 1 mg by mouth at bedtime.    . metoprolol (LOPRESSOR) 50 MG tablet Take 50 mg by mouth 2 (two) times daily.    . Multiple Vitamins-Minerals (PRESERVISION/LUTEIN PO) Take 1 tablet by mouth 2 (two) times daily.     . nitroGLYCERIN (NITROSTAT) 0.4 MG SL tablet Place 0.4 mg under the tongue every 5 (five) minutes as needed for chest pain.     . Vitamin D, Ergocalciferol, (DRISDOL) 50000 units CAPS capsule TAKE ONE CAPSULE (50 000 UNITS) BY MOUTHEVERY 14 DAYS 12 capsule 1   No current facility-administered medications for this visit.    Facility-Administered Medications Ordered in Other Visits  Medication Dose Route Frequency Provider Last Rate Last Dose  . fludeoxyglucose F - 18 (FDG) injection 9.6 millicurie  9.6 millicurie Intravenous Once PRN Pitovski, Dimitri Z, MD          Review of Systems:     Cardiac Review of Systems: Y or N  Chest Pain [ n   ]  Resting SOB Blue.Reese ] Exertional SOB  [ y ]  Orthopnea [ n ]   Pedal Edema [ n  ]    Palpitations [n  ] Syncope  [n  ]   Presyncope [ n  ]  General Review of Systems: [Y] = yes [  ]=no Constitional: recent weight change [  ];  Wt loss over the last 3 months [   ] anorexia [  ]; fatigue [  ]; nausea [  ]; night sweats [  ]; fever [n  ]; or chills [n  ];          Dental: poor dentition[  ]; Last  Dentist visit:   Eye : blurred vision [  ]; diplopia [   ]; vision changes [  ];  Amaurosis fugax[  ]; Resp: cough [ y ];  wheezing[ y ];  hemoptysis[n  ]; shortness of breath[y  ]; paroxysmal nocturnal dyspnea[ n ]; dyspnea on exertion[y  ]; or orthopnea[  ];  GI:  gallstones[  ], vomiting[  ];  dysphagia[  ]; melena[  ];  hematochezia [  ]; heartburn[  ];   Hx of  Colonoscopy[  ]; GU: kidney stones [  ]; hematuria[  ];   dysuria [  ];  nocturia[  ];  history of     obstruction [  ]; urinary frequency [  ]             Skin: rash, swelling[  ];, hair loss[  ];  peripheral edema[  ];  or itching[  ]; Musculosketetal: myalgias[  ];  joint swelling[  ];  joint erythema[  ];  joint pain[  ];  back  pain[  ];  Heme/Lymph: bruising[  ];  bleeding[  ];  anemia[  ];  Neuro: TIA[  ];  headaches[  ];  stroke[  ];  vertigo[  ];  seizures[  ];   paresthesias[  ];  difficulty walking[  ];  Psych:depression[  ]; anxiety[  ];  Endocrine: diabetes[  ];  thyroid dysfunction[  ];  Immunizations: Flu up to date [  ]; Pneumococcal up to date [  ];  Other:  Physical Exam: BP 127/80   Pulse 80   Resp 20   Ht 5\' 7"  (1.702 m)   Wt 192 lb (87.1 kg)   LMP 02/27/2008   SpO2 97% Comment: RA  BMI 30.07 kg/m   PHYSICAL EXAMINATION: General appearance: alert and cooperative Head: Normocephalic, without obvious abnormality, atraumatic Neck: no adenopathy, no carotid bruit, no JVD, supple, symmetrical, trachea midline and thyroid not enlarged, symmetric, no tenderness/mass/nodules Lymph nodes: Cervical, supraclavicular, and axillary nodes normal. Resp: clear to auscultation bilaterally Back: symmetric, no curvature. ROM normal. No CVA tenderness. Cardio: regular rate and rhythm, S1, S2 normal, no murmur, click, rub or gallop GI: soft, non-tender; bowel sounds normal; no masses,  no organomegaly Extremities: extremities normal, atraumatic, no cyanosis or edema and Homans sign is negative, no sign of DVT Neurologic:  Grossly normal Palpable DP and PT pulses  Diagnostic Studies & Laboratory data:     Recent Radiology Findings:   Nm Pet Image Initial (pi) Skull Base To Thigh  Result Date: 06/29/2016 CLINICAL DATA:  Initial treatment strategy for pulmonary nodule. EXAM: NUCLEAR MEDICINE PET SKULL BASE TO THIGH TECHNIQUE: 9.6 mCi F-18 FDG was injected intravenously. Full-ring PET imaging was performed from the skull base to thigh after the radiotracer. CT data was obtained and used for attenuation correction and anatomic localization. FASTING BLOOD GLUCOSE:  Value: 110 mg/dl COMPARISON:  CT chest dated 05/22/2016. Virtual colonoscopy dated 03/19/2007. 11 x 10 mm on 2009 virtual colonoscopy. FINDINGS: NECK No hypermetabolic lymph nodes in the neck. CHEST No hypermetabolic mediastinal or hilar nodes. Macrolobulated 16 x 15 mm left lower lobe pulmonary nodule (series 8/image 48), max SUV 1.8. This previously measured 11 x 10 mm on 2009 virtual colonoscopy. The extremely slow growth rate and relative lack of hypermetabolism for lesion size is compatible with a benign nodule such as hamartoma. No suspicious pulmonary nodules. The heart is normal in size. Three vessel coronary atherosclerosis. Atherosclerotic calcifications of the aortic arch. Postsurgical changes in the medial left breast (series 4/image 32). Status post left axillary lymph node dissection. ABDOMEN/PELVIS No abnormal hypermetabolic activity within the liver, pancreas, adrenal glands, or spleen. No hypermetabolic lymph nodes in the abdomen or pelvis. Atherosclerotic calcifications the abdominal aorta and branch vessels. Extensive sigmoid diverticulosis, without evidence of diverticulitis. SKELETON No focal hypermetabolic activity to suggest skeletal metastasis. Degenerative changes of the visualized thoracolumbar spine. Postsurgical changes involving the lumbar spine. 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 This nodule is not considered suspicious for primary bronchogenic neoplasm or metastasis. Dedicated follow-up imaging is not required. Electronically Signed   By: Julian Hy M.D.   On: 06/29/2016 15:32  Study Result   CLINICAL DATA:  History of left breast carcinoma with shortness of breath and wheezing  EXAM: CT CHEST WITH CONTRAST  TECHNIQUE: Multidetector CT imaging of the chest was performed during intravenous contrast administration.  CONTRAST:  26mL ISOVUE-300 IOPAMIDOL (ISOVUE-300) INJECTION 61%  Creatinine was obtained on site at [facility] at [address]  Results: Creatinine 0.9 mg/dL.  COMPARISON:  01/25/2014, 03/19/2007  FINDINGS: Cardiovascular: Atherosclerotic calcifications of thoracic aorta are noted. No aneurysmal dilatation or dissection is noted. Heavy coronary calcifications are seen. No significant cardiac enlargement is noted. The pulmonary artery as visualized shows no large central pulmonary embolus.  Mediastinum/Nodes: The thoracic inlet is within normal limits. No significant hilar or mediastinal adenopathy is identified. The esophagus as visualized demonstrates some mild thickening circumferentially in the distal aspect which may be related to reflux. Clinical correlation is recommended.  Lungs/Pleura: The lungs are well aerated bilaterally and demonstrate emphysematous change. No focal infiltrate or sizable effusion is seen. Lobular new nodule is noted in the left lower lobe best seen on image number 92 of series 5. This measures 17 x 17 mm in greatest dimension and has increased in size from both the recent chest x-ray from 2015 as well as a prior CT examination of 2009  Upper Abdomen: No focal abnormality is noted.  Musculoskeletal: Degenerative changes of the thoracic spine are noted.  Postsurgical changes in the left breast are seen. No acute bony abnormality is noted. Postsurgical changes in the lumbar spine are seen.  IMPRESSION: Enlarging left lower lobe nodule as described. Tissue sampling and/or PET-CT is recommended for further evaluation.  These results will be called to the ordering clinician or representative by the Radiologist Assistant, and communication documented in the PACS or zVision Dashboard.   Electronically Signed   By: Inez Catalina M.D.   On: 05/22/2016 14:44    I have independently reviewed the above radiology studies  and reviewed the findings with the patient.     Recent Lab Findings: Lab Results  Component Value Date   WBC 8.8 01/25/2014   HGB 14.7 01/25/2014   HCT 45.6 01/25/2014   PLT 175 12/19/2012   GLUCOSE 100 (H) 11/09/2013   CHOL 128 01/18/2011   TRIG 103 01/18/2011   HDL 47 01/18/2011   LDLCALC 60 01/18/2011   ALT 156 (H) 01/18/2011   AST 142 (H) 01/18/2011   NA 142 11/09/2013   K 4.1 11/09/2013   CL 100 11/09/2013   CREATININE 0.78 11/09/2013   BUN 16 11/09/2013   CO2 27 11/09/2013   INR 1.57 (H) 01/19/2011    PFT's  fev1 0.89 35 % 28% improvement with bronchdilators DLCO 9.17  30%  Interpretation: The FVC, FEV1, FEV1/FVC ratio and FEF25-75% are reduced indicating airway obstruction. While the TLC is within normal limits, the FRC, RV and RV/TLC ratio are increased. Following administration of bronchodilators, there is a significant response indicated by the increased FVC. The reduced diffusing capacity indicates a severe loss of functional alveolar capillary surface. However, the diffusing capacity was not corrected for the patient's hemoglobin. 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 Recommend to the patient we  have follow-up CT scan in one year. She will discuss with her primary care physician referral or treatment for her underlying severe obstructive airway disease.    I  spent 40 minutes counseling the patient face to face and 50% or more the  time was spent in counseling and coordination of care. The total time spent in the appointment was 60 minutes.  Grace Isaac MD      [address] [place] [address] Office [phone]   Beeper [phone]  07/03/2016 3:23 PM

## 2016-08-07 NOTE — Progress Notes (Signed)
The patient has been seen in conjunction with Vin Raynesha Tiedt, PAC. All aspects of care have been considered and discussed. The patient has been personally interviewed, examined, and all clinical data has been reviewed.   The patient has not been seen in quite some time.  The question now is progressive dyspnea on exertion.  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 History of chronic lung disease.  Plan discussed and we'll proceed with myocardial perfusion imaging to exclude progression of coronary disease in other territories and an echocardiogram to exclude systolic dysfunction contributing to dyspnea. Overall, it is likely that dyspnea is pulmonary and we are trying to exclude a contribution from cardiac.   Cardiology Office Note    Date:  08/09/2016   ID:  Rachel Chase, DOB Sep 09, 1940, MRN [id number]  PCP:  Burnard Bunting, MD  Cardiologist:  New to [facility] ( remotely seen by Dr. Tamala Julian)  Chief Complaint: Dyspnea on exertion  History of Present Illness:   Rachel Chase is a 76 y.o. female with hx of CAD, HTN, HLD, COPD , LLL lung nodules/mass and airway disease refereed by Dr. Servando Snare for shortness of breath.   Hx of cardiac arrest 12/2010. Cath by Dr. Terrence Dupont showed 100% occluded RCA without success as the flow could not be established (Attempted PTCA). The vessel appears to be chronically occluded with large burden of thrombus. Seen in Dr. Tamala Julian once for follow up (unable to find notes).   Last echocardiogram in 12/2010 showed normal LV function.  Patient was seen by Dr. Servando Snare 07/03/16 for surgical consultation for slow growing LLL mass.  Did not recommended resection given poor pulmonary function and very unlikely to be lung cancer or metastatic breast cancer.   Has a chronic dyspnea on exertion. Only occurs when she carries groceries or climbing stairs. It goes away with resting. She also has  intermittent chest pressure. Occurs once or twice a year that resolved with sublingual nitroglycerin. Last episode approximately a week ago when she was sitting on a couch. Resolved with nitroglycerin x 1. She denies any orthopnea, PND, syncope, dizziness, palpitation, or extremity edema, snoring, daytime tiredness. She does for stretching exercise and dancing class and noted progressive worsening dyspnea. Resolved with rest in 30-45 seconds.  Past Medical History:  Diagnosis Date  . Anxiety   . Arthritis   . Back pain    Lower Back pain - takes Cortisone shots  . Bone spur 2009   lower back  . Breast cancer ([place]) 2015   finished radiation 12/15  . Cataract   . Complication of anesthesia    throat irrition from past intubations, intubation granulomas  . Coronary artery disease   . Hyperlipemia   . Hypertension   . MI (myocardial infarction) ([place]) 12/2010   Was followed by Dr. Pernell Dupre  . Radiation 01/06/14-02/04/14   left breast   . Rosacea 2008   both eyes    Past Surgical History:  Procedure Laterality Date  . BACK SURGERY    . BREAST BIOPSY  8/10   fibrocystic changes  . BREAST LUMPECTOMY WITH NEEDLE LOCALIZATION AND AXILLARY SENTINEL LYMPH NODE BX Left 11/13/2013   Procedure: NEEDLE LOCALIZATION LEFT BREAST LUMPECTOMY WITH LEFT  AXILLARY SENTINEL LYMPH NODE BX;  Surgeon: Excell Seltzer, MD;  Location: [facility];  Service: General;  Laterality: Left;  . CATARACT EXTRACTION     both  . EYE SURGERY  [facility] approx 2007  . heart stent     failed-no stents  . HYSTEROSCOPY  9/10   D&C (polyp)  . LEFT HEART CATHETERIZATION WITH CORONARY ANGIOGRAM N/A 01/18/2011   Procedure: LEFT HEART CATHETERIZATION WITH CORONARY ANGIOGRAM;  Surgeon: Clent Demark, MD;  Location: [facility] CATH LAB;  Service: Cardiovascular;  Laterality: N/A;  . PERCUTANEOUS CORONARY STENT INTERVENTION (PCI-S) N/A 01/18/2011   Procedure: PERCUTANEOUS CORONARY STENT INTERVENTION  (PCI-S);  Surgeon: Clent Demark, MD;  Location: [facility] CATH LAB;  Service: Cardiovascular;  Laterality: N/A;  . TONSILLECTOMY AND ADENOIDECTOMY    . TUBAL LIGATION      Current Medications: Prior to Admission medications   Medication Sig Start Date End Date Taking? Authorizing Provider  albuterol (PROVENTIL HFA;VENTOLIN HFA) 108 (90 BASE) MCG/ACT inhaler Inhale 2 puffs into the lungs daily as needed for wheezing or shortness of breath.    [provider]  anastrozole (ARIMIDEX) 1 MG tablet Take 1 tablet (1 mg total) by mouth daily. 05/14/16   Nicholas Lose, MD  Artificial Tear Solution (SOOTHE HYDRATION OP) Place 1 drop into both eyes daily as needed (dry eyes).    [provider]  aspirin EC 81 MG tablet Take 81 mg by mouth daily with supper.    [provider]  atorvastatin (LIPITOR) 40 MG tablet Take 40 mg by mouth at bedtime.  10/08/12   [provider]  hydrochlorothiazide (HYDRODIURIL) 25 MG tablet Take 25 mg by mouth daily.  10/05/13   [provider]  HYDROcodone-acetaminophen (NORCO/VICODIN) 5-325 MG per tablet Take 1 tablet by mouth daily. 1/2 tab in am and 1/2 tab night    [provider]  lisinopril (PRINIVIL,ZESTRIL) 40 MG tablet Take 40 mg by mouth daily.  10/20/13   [provider]  LORazepam (ATIVAN) 1 MG tablet Take 1 mg by mouth at bedtime.    [provider]  metoprolol (LOPRESSOR) 50 MG tablet Take 50 mg by mouth 2 (two) times daily.    [provider]  Multiple Vitamins-Minerals (PRESERVISION/LUTEIN PO) Take 1 tablet by mouth 2 (two) times daily.     [provider]  nitroGLYCERIN (NITROSTAT) 0.4 MG SL tablet Place 0.4 mg under the tongue every 5 (five) minutes as needed for chest pain.     [provider]  Vitamin D, Ergocalciferol, (DRISDOL) 50000 units CAPS capsule TAKE ONE CAPSULE (50 000 UNITS) BY MOUTHEVERY 14 DAYS 03/29/16   Megan Salon, MD    Allergies:   Dilaudid  [hydromorphone hcl] and Penicillins   Social History   Social History  . Marital status: Widowed    Spouse name: N/A  . Number of children: 3  . Years of education: N/A   Social History Main Topics  . Smoking status: Former Smoker    Packs/day: 1.50    Years: 45.00    Types: Cigarettes    Quit date: 04/17/1998  . Smokeless tobacco: Current User     Comment: nicorette gum  . Alcohol use 0.6 - 1.2 oz/week    1 - 2 Standard drinks or equivalent per week     Comment: occ  . Drug use: No  . Sexual activity: No   Other Topics Concern  . None   Social History Narrative  . None     Family History:  The patient's family history includes Breast cancer in her maternal aunt and sister; Breast cancer (age of onset: 36) in her maternal grandmother; CVA in her father;  Cancer in her sister; Congestive Heart Failure in her father; Coronary artery disease in her father; Hypertension in her mother; Rectal cancer in her sister; Valvular heart disease in her mother.   ROS:   Please see the history of present illness.    ROS All other systems reviewed and are negative.   PHYSICAL EXAM:   VS:  BP 118/60   Pulse 94   Ht 5\' 7"  (1.702 m)   Wt 191 lb 1.9 oz (86.7 kg)   LMP 02/27/2008   SpO2 96%   BMI 29.93 kg/m    GEN: Well nourished, well developed, in no acute distress  HEENT: normal  Neck: no JVD, carotid bruits, or masses Cardiac: RRR; no murmurs, rubs, or gallops,no edema  Respiratory:  clear to auscultation bilaterally, normal work of breathing GI: soft, nontender, nondistended, + BS MS: no deformity or atrophy  Skin: warm and dry, no rash Neuro:  Alert and Oriented x 3, Strength and sensation are intact Psych: euthymic mood, full affect  Wt Readings from Last 3 Encounters:  08/09/16 191 lb 1.9 oz (86.7 kg)  07/03/16 192 lb (87.1 kg)  06/12/16 192 lb 3.2 oz (87.2 kg)      Studies/Labs Reviewed:   EKG:  EKG is ordered today.  The ekg ordered today demonstrates  NSR  Recent Labs: No results found for requested labs within last 8760 hours.   Lipid Panel    Component Value Date/Time   CHOL 128 01/18/2011 2000   TRIG 103 01/18/2011 2000   HDL 47 01/18/2011 2000   CHOLHDL 2.7 01/18/2011 2000   VLDL 21 01/18/2011 2000   [place] 60 01/18/2011 2000    Additional studies/ records that were reviewed today include:   Echocardiogram: 12/2010 Study Conclusions  - Left ventricle: The cavity size was normal. Systolic function was normal. The estimated ejection fraction was in the range of 50% to 55%. - Atrial septum: No defect or patent foramen ovale was identified. - Pericardium, extracardiac: Features were not consistent with tamponade physiology.  Cardiac Catheterization: 12/2010 by Dr. Terrence Dupont PROCEDURES: 1. Left cardiac cath with selective left and right coronary     angiography, LV graphy via right groin using Judkins technique. 2. Insertion of temporary transvenous pacemaker via right femoral     venous approach up to RV apex. 3. Attempted PTCA to 100% occluded RCA without success as the flow     could not be established.  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  INDICATION FOR THE PROCEDURE:  Rachel Chase is a 76 year old female with past medical history significant for hypertension, degenerative joint disease, remote tobacco abuse, came to ER following cardiac arrest at home.  As per the patient, the patient was complaining of prolonged chest pain, approximately lasting 3 hours 4 days ago on Sunday night associated with shortness of breath.  The patient did not seek any medical attention then and today while in the kitchen suddenly collapsed and hit the back of the head to the floor and became unconscious.  Son called EMS and started CPR and the patient was noted to be in VFib arrest requiring defibrillation and then intubation in the ED.  The patient was noted to be hypotensive  with blood pressure in the 69G systolic.  The patient had CT of the brain in the ED, which showed no evidence of acute bleed.  EKG done in the ER showed ST-elevation in lead 2, 3, aVF, and  reciprocal changes in lead 1 and aVF, suggestive of acute inferior wall MI.  Discussed with patient's son at length regarding emergency cath, possible PTCA stenting, its risks and benefits and consented for the procedure.  After obtaining the informed consent, the patient was brought to the cath lab and was placed on fluoroscopy table. Right groin was prepped and draped in the usual fashion.  A 2% Xylocaine was used for local anesthesia in the right groin.  With the help of thin wall needle, 6-French arterial and venous sheaths were placed.  Both the sheaths were aspirated and flushed.  Next, a 6-French left Judkins catheter was advanced over the wire under fluoroscopic guidance up to the ascending aorta.  Wire was pulled out.  The catheter was aspirated and connected to the Manifold.  Catheter was further advanced and engaged into left coronary ostium.  Multiple views of the left system were taken.  Next, the catheter was disengaged and was pulled out over the wire and was replaced with 6-French right guiding catheter which was advanced over the wire under fluoroscopic guidance up to the ascending aorta.  Wire was pulled out.  The catheter was aspirated and connected to the Manifold.  Catheter was further advanced and engaged into right coronary ostium.  A single view of right coronary artery was obtained. Next, this catheter was disengaged and was pulled out over the wire at the end of the procedure and was replaced with 6-French pigtail catheter which was advanced over the wire under fluoroscopic guidance up to the ascending aorta.  Wire was pulled out.  The catheter was aspirated and connected to the Manifold.  Catheter was further advanced across the aortic valve into the LV.  LV pressures were  recorded.  Next, LV graphy was done in 30 degree RAO position.  Post angiographic pressures were recorded from LV and then pullback pressures were recorded from the aorta.  There was no gradient across the aortic valve.  Next, the pigtail catheter was pulled out over the wire.  Sheaths were aspirated and flushed.  Her left main was short which was patent.  LAD has 20-30% proximal and 25-35% mid sequential stenosis.  Left circumflex has proximal 30% stenosis.  OM-1 and 2 are very small.  OM-3 is large which is patent.  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  INTERVENTIONAL PROCEDURE:  Prior to PCI, transvenous pacemaker was inserted up to RV apex without any difficulty.  Next, I attempted to do PTCA to proximal and mid RCA initially using 1.5 x 20-mm long mini trek balloon.  Multiple inflations were done with no improvement in flow. Then, PTCA to mid RCA was done using 2.5 x12-mm long mini trek balloon with improvement in flow in distal RCA, but with large thrombus burden in the mid and proximal portion.  The wire appears to have gone from subintimal space to true lumen.  Attempted to rewire true lumen into acute RV branch.  Attempted to rewire to the main distal RCA with success and then PTCA to mid and distal junction of RCA was done using 1.25 mm x 20 mm long sprinter Legend balloon.  Angiogram after inflation in mid and distal junction of RCA showed type 2 dissection with myocardial hematoma and mild extravasation of the dye.  The patient remained hemodynamically stable, attempted to deploy Joe med covered stent up to mid and distal RCA without success as stent could not be tracked down.  Her  angiogram was discontinued.  Emergency 2D echo was also obtained which showed no significant pericardial effusion or if any evidence of tamponade. Relook angiogram of RCA showed complete occlusion of RCA in the proximal portion with no  evidence of further extravasation of the dye.  The patient was transferred to recovery room in stable condition.     ASSESSMENT & PLAN:    1. Dyspnea on exercise - last echo 12/2010 showed normal LV function. Aggressively for worsen. Could be anginal equivalent. We will do echocardiogram and Lexiscan. She would not able to walk on a treadmill due to dyspnea. No signs of CHF. No snoring. Advised to follow up with PCP to discuss pulmonary lung function test and treatment.   2. CAD with 100% occulded RCA by cath 2012 (unable to do PTCA) - Continue ASA and statin.   3. HTN - Well controlled on current regimen  4. HLD - Continue statin. Followed by PCP.   Patient was seen and examined by Dr. Tamala Julian.   Medication Adjustments/Labs and Tests Ordered: Current medicines are reviewed at length with the patient today.  Concerns regarding medicines are outlined above.  Medication changes, Labs and Tests ordered today are listed in the Patient Instructions below. Patient Instructions  Medication Instructions:  Your physician recommends that you continue on your current medications as directed. Please refer to the Current Medication list given to you today.   Labwork: None Ordered   Testing/Procedures: Your physician has requested that you have an echocardiogram. Echocardiography is a painless test that uses sound waves to create images of your heart. It provides your doctor with information about the size and shape of your heart and how well your heart's chambers and valves are working. This procedure takes approximately one hour. There are no restrictions for this procedure.  Your physician has requested that you have a lexiscan myoview. For further information please visit [email or web address]. Please follow instruction sheet, as given.    Follow-Up: Your physician recommends that you schedule a follow-up appointment in: 6-8 weeks with Dr. Tamala Julian.   Any Other Special Instructions Will  Be Listed Below (If Applicable).     If you need a refill on your cardiac medications before your next appointment, please call your pharmacy.      Jarrett Soho, [place]  08/09/2016 10:56 AM    [facility] [address], [place], [place]  [address] Phone: [phone]; Fax: [phone]

## 2016-08-09 ENCOUNTER — Encounter: Payer: Self-pay | Admitting: Physician Assistant

## 2016-08-09 ENCOUNTER — Encounter (INDEPENDENT_AMBULATORY_CARE_PROVIDER_SITE_OTHER): Payer: Self-pay

## 2016-08-09 ENCOUNTER — Ambulatory Visit (INDEPENDENT_AMBULATORY_CARE_PROVIDER_SITE_OTHER): Payer: Medicare Other | Admitting: Physician Assistant

## 2016-08-09 VITALS — BP 118/60 | HR 94 | Ht 67.0 in | Wt 191.1 lb

## 2016-08-09 DIAGNOSIS — I25110 Atherosclerotic heart disease of native coronary artery with unstable angina pectoris: Secondary | ICD-10-CM | POA: Diagnosis not present

## 2016-08-09 DIAGNOSIS — R0609 Other forms of dyspnea: Secondary | ICD-10-CM

## 2016-08-09 DIAGNOSIS — I10 Essential (primary) hypertension: Secondary | ICD-10-CM

## 2016-08-09 DIAGNOSIS — E785 Hyperlipidemia, unspecified: Secondary | ICD-10-CM

## 2016-08-09 NOTE — Patient Instructions (Signed)
Medication Instructions:  Your physician recommends that you continue on your current medications as directed. Please refer to the Current Medication list given to you today.   Labwork: None Ordered   Testing/Procedures: Your physician has requested that you have an echocardiogram. Echocardiography is a painless test that uses sound waves to create images of your heart. It provides your doctor with information about the size and shape of your heart and how well your heart's chambers and valves are working. This procedure takes approximately one hour. There are no restrictions for this procedure.  Your physician has requested that you have a lexiscan myoview. For further information please visit [email or web address]. Please follow instruction sheet, as given.    Follow-Up: Your physician recommends that you schedule a follow-up appointment in: 6-8 weeks with Dr. Tamala Julian.   Any Other Special Instructions Will Be Listed Below (If Applicable).     If you need a refill on your cardiac medications before your next appointment, please call your pharmacy.

## 2016-08-21 ENCOUNTER — Telehealth (HOSPITAL_COMMUNITY): Payer: Self-pay | Admitting: *Deleted

## 2016-08-21 NOTE — Telephone Encounter (Signed)
Patient given detailed instructions per Myocardial Perfusion Study Information Sheet for the test on 08/24/16 at 0915. Patient notified to arrive 15 minutes early and that it is imperative to arrive on time for appointment to keep from having the test rescheduled.  If you need to cancel or reschedule your appointment, please call the office within 24 hours of your appointment. . Patient verbalized understanding.Lisa Milian, [place]

## 2016-08-24 ENCOUNTER — Ambulatory Visit (HOSPITAL_BASED_OUTPATIENT_CLINIC_OR_DEPARTMENT_OTHER): Payer: Medicare Other

## 2016-08-24 ENCOUNTER — Ambulatory Visit (HOSPITAL_COMMUNITY): Payer: Medicare Other | Attending: Cardiology

## 2016-08-24 ENCOUNTER — Other Ambulatory Visit: Payer: Self-pay

## 2016-08-24 ENCOUNTER — Encounter (INDEPENDENT_AMBULATORY_CARE_PROVIDER_SITE_OTHER): Payer: Self-pay

## 2016-08-24 DIAGNOSIS — E785 Hyperlipidemia, unspecified: Secondary | ICD-10-CM | POA: Diagnosis not present

## 2016-08-24 DIAGNOSIS — R0609 Other forms of dyspnea: Secondary | ICD-10-CM

## 2016-08-24 DIAGNOSIS — I2510 Atherosclerotic heart disease of native coronary artery without angina pectoris: Secondary | ICD-10-CM | POA: Diagnosis not present

## 2016-08-24 DIAGNOSIS — I10 Essential (primary) hypertension: Secondary | ICD-10-CM | POA: Insufficient documentation

## 2016-08-24 DIAGNOSIS — Z87891 Personal history of nicotine dependence: Secondary | ICD-10-CM | POA: Diagnosis not present

## 2016-08-24 LAB — MYOCARDIAL PERFUSION IMAGING
CHL CUP NUCLEAR SSS: 9
CSEPPHR: 86 {beats}/min
LV sys vol: 19 mL
LVDIAVOL: 66 mL (ref 46–106)
NUC STRESS TID: 1.07
RATE: 0.23
Rest HR: 63 {beats}/min
SDS: 5
SRS: 4

## 2016-08-24 MED ORDER — TECHNETIUM TC 99M TETROFOSMIN IV KIT
32.1000 | PACK | Freq: Once | INTRAVENOUS | Status: AC | PRN
Start: 2016-08-24 — End: 2016-08-24
  Administered 2016-08-24: 32.1 via INTRAVENOUS
  Filled 2016-08-24: qty 33

## 2016-08-24 MED ORDER — TECHNETIUM TC 99M TETROFOSMIN IV KIT
10.3000 | PACK | Freq: Once | INTRAVENOUS | Status: AC | PRN
Start: 2016-08-24 — End: 2016-08-24
  Administered 2016-08-24: 10.3 via INTRAVENOUS
  Filled 2016-08-24: qty 11

## 2016-08-24 MED ORDER — REGADENOSON 0.4 MG/5ML IV SOLN
0.4000 mg | Freq: Once | INTRAVENOUS | Status: AC
  Administered 2016-08-24: 0.4 mg via INTRAVENOUS

## 2016-10-04 DIAGNOSIS — I2510 Atherosclerotic heart disease of native coronary artery without angina pectoris: Secondary | ICD-10-CM | POA: Insufficient documentation

## 2016-10-04 NOTE — Progress Notes (Signed)
Cardiology Office Note    Date:  10/05/2016   ID:  Rachel Chase [birth year], MRN [id number]  PCP:  Rachel Bunting, MD  Cardiologist: Rachel Grooms, MD   Chief Complaint  Patient presents with  . Coronary Artery Disease  . Shortness of Breath    History of Present Illness:  Rachel Chase is a 76 y.o. female with hx of CAD and known chronic total occlusion of RCA noted after cardiac arrest, HTN, HLD, COPD , LLL lung nodules/mass and airway disease and shortness of breath.   Seen earlier this year by Rachel Gemma, PA-C for progressive dyspnea. She has a known history of coronary artery disease and is status post prior cardiac arrest during acute coronary syndrome associated with the right coronary artery. Angioplasty acutely on the right coronary was not successful. She has done well since that time.  For several month she has noticed progressive dyspnea on exertion. Cardiac evaluation including a myocardial perfusion imaging study was performed and felt to be low risk. She has subsequently seen her primary care and evaluation for COPD has been performed. She apparently has significant lung disease and is improving on bronchodilator therapy. She is much better now than when initially seen earlier this year.   Past Medical History:  Diagnosis Date  . Anxiety   . Arthritis   . Back pain    Lower Back pain - takes Cortisone shots  . Bone spur 2009   lower back  . Breast cancer ([place]) 2015   finished radiation 12/15  . CAD (coronary artery disease)    a. cath 12/2010 for cardiac arrest showed   . Cataract   . Complication of anesthesia    throat irrition from past intubations, intubation granulomas  . Coronary artery disease   . History of cardiac arrest 12/2010  . Hyperlipemia   . Hypertension   . MI (myocardial infarction) ([place]) 12/2010   Was followed by Dr. Pernell Dupre  . Radiation 01/06/14-02/04/14   left breast   . Rosacea 2008   both eyes    Past Surgical  History:  Procedure Laterality Date  . BACK SURGERY    . BREAST BIOPSY  8/10   fibrocystic changes  . BREAST LUMPECTOMY WITH NEEDLE LOCALIZATION AND AXILLARY SENTINEL LYMPH NODE BX Left 11/13/2013   Procedure: NEEDLE LOCALIZATION LEFT BREAST LUMPECTOMY WITH LEFT  AXILLARY SENTINEL LYMPH NODE BX;  Surgeon: Excell Seltzer, MD;  Location: [facility];  Service: General;  Laterality: Left;  . CATARACT EXTRACTION     both  . [facility] approx 2007  . heart stent     failed-no stents  . HYSTEROSCOPY  9/10   D&C (polyp)  . LEFT HEART CATHETERIZATION WITH CORONARY ANGIOGRAM N/A 01/18/2011   Procedure: LEFT HEART CATHETERIZATION WITH CORONARY ANGIOGRAM;  Surgeon: Clent Demark, MD;  Location: [facility] CATH LAB;  Service: Cardiovascular;  Laterality: N/A;  . PERCUTANEOUS CORONARY STENT INTERVENTION (PCI-S) N/A 01/18/2011   Procedure: PERCUTANEOUS CORONARY STENT INTERVENTION (PCI-S);  Surgeon: Clent Demark, MD;  Location: [facility] CATH LAB;  Service: Cardiovascular;  Laterality: N/A;  . TONSILLECTOMY AND ADENOIDECTOMY    . TUBAL LIGATION      Current Medications: Outpatient Medications Prior to Visit  Medication Sig Dispense Refill  . albuterol (PROVENTIL HFA;VENTOLIN HFA) 108 (90 BASE) MCG/ACT inhaler Inhale 2 puffs into the lungs daily as needed for wheezing or shortness of breath.    Marland Kitchen  anastrozole (ARIMIDEX) 1 MG tablet Take 1 tablet (1 mg total) by mouth daily. 90 tablet 3  . Artificial Tear Solution (SOOTHE HYDRATION OP) Place 1 drop into both eyes daily as needed (dry eyes).    Marland Kitchen aspirin EC 81 MG tablet Take 81 mg by mouth daily with supper.    Marland Kitchen atorvastatin (LIPITOR) 40 MG tablet Take 40 mg by mouth at bedtime.     . hydrochlorothiazide (HYDRODIURIL) 25 MG tablet Take 25 mg by mouth daily.     Marland Kitchen HYDROcodone-acetaminophen (NORCO/VICODIN) 5-325 MG per tablet Take 1 tablet by mouth daily. 1/2 tab in am and 1/2 tab night    . lisinopril (PRINIVIL,ZESTRIL) 40  MG tablet Take 40 mg by mouth daily.     Marland Kitchen LORazepam (ATIVAN) 1 MG tablet Take 1 mg by mouth at bedtime.    . metoprolol (LOPRESSOR) 50 MG tablet Take 50 mg by mouth 2 (two) times daily.    . Multiple Vitamins-Minerals (PRESERVISION/LUTEIN PO) Take 1 tablet by mouth 2 (two) times daily.     . nitroGLYCERIN (NITROSTAT) 0.4 MG SL tablet Place 0.4 mg under the tongue every 5 (five) minutes as needed for chest pain.     . Vitamin D, Ergocalciferol, (DRISDOL) 50000 units CAPS capsule TAKE ONE CAPSULE (50 000 UNITS) BY MOUTHEVERY 14 DAYS 12 capsule 1   No facility-administered medications prior to visit.      Allergies:   Dilaudid [hydromorphone hcl] and Penicillins   Social History   Social History  . Marital status: Widowed    Spouse name: N/A  . Number of children: 3  . Years of education: N/A   Social History Main Topics  . Smoking status: Former Smoker    Packs/day: 1.50    Years: 45.00    Types: Cigarettes    Quit date: 04/17/1998  . Smokeless tobacco: Current User     Comment: nicorette gum  . Alcohol use 0.6 - 1.2 oz/week    1 - 2 Standard drinks or equivalent per week     Comment: occ  . Drug use: No  . Sexual activity: No   Other Topics Concern  . None   Social History Narrative  . None     Family History:  The patient's family history includes Breast cancer in her maternal aunt and sister; Breast cancer (age of onset: 44) in her maternal grandmother; CVA in her father; Cancer in her sister; Congestive Heart Failure in her father; Coronary artery disease in her father; Hypertension in her mother; Rectal cancer in her sister; Valvular heart disease in her mother.   ROS:   Please see the history of present illness.    None  All other systems reviewed and are negative.   PHYSICAL EXAM:   VS:  BP 112/60 (BP Location: Left Arm)   Pulse 67   Ht 5' 8.5" (1.74 m)   Wt 192 lb 6.4 oz (87.3 kg)   LMP 02/27/2008   BMI 28.83 kg/m    GEN: Well nourished, well developed,  in no acute distress  HEENT: normal  Neck: no JVD, carotid bruits, or masses Cardiac: RRR; no murmurs, rubs, or gallops,no edema  Respiratory:  clear to auscultation bilaterally, normal work of breathing GI: soft, nontender, nondistended, + BS MS: no deformity or atrophy  Skin: warm and dry, no rash Neuro:  Alert and Oriented x 3, Strength and sensation are intact Psych: euthymic mood, full affect  Wt Readings from Last 3 Encounters:  10/05/16 192  lb 6.4 oz (87.3 kg)  08/24/16 191 lb (86.6 kg)  08/09/16 191 lb 1.9 oz (86.7 kg)      Studies/Labs Reviewed:   EKG:  EKG  Not repeated  Recent Labs: No results found for requested labs within last 8760 hours.   Lipid Panel    Component Value Date/Time   CHOL 128 01/18/2011 2000   TRIG 103 01/18/2011 2000   HDL 47 01/18/2011 2000   CHOLHDL 2.7 01/18/2011 2000   VLDL 21 01/18/2011 2000   [place] 60 01/18/2011 2000    Additional studies/ records that were reviewed today include:  Myocardial perfusion imaging performed 08/24/2016: Study Highlights     Nuclear stress EF: 71%.  There was no ST segment deviation noted during stress.  The study is normal.  This is a low risk study.  The left ventricular ejection fraction is hyperdynamic (>65%).   Normal Lexiscan nuclear stress test with no evidence for prior infarct or ischemia.      ASSESSMENT:    1. Dyspnea on exertion   2. Coronary artery disease involving native coronary artery of native heart without angina pectoris   3. Essential hypertension   4. Hyperlipidemia LDL goal <70      PLAN:  In order of problems listed above:  1. Dyspnea is improving after addition of bronchodilator therapy. Myocardial perfusion imaging was low risk and myocardial ischemia/coronary disease is not felt to be contributing to her symptoms of dyspnea. 2. Low risk perfusion imaging. No further therapy or investigation is needed. Guideline redirected medical therapy for symptom  control and survival benefits including blood pressure control, lipid control, and hemoglobin A1c less than 7. 3. Target blood pressure 140/90 mmHg or less. 4. Target LDL less than 70.   Clinical follow-up in one year. No change in the current medical regimen.    Medication Adjustments/Labs and Tests Ordered: Current medicines are reviewed at length with the patient today.  Concerns regarding medicines are outlined above.  Medication changes, Labs and Tests ordered today are listed in the Patient Instructions below. Patient Instructions  Medication Instructions:  None  Labwork: None  Testing/Procedures: None  Follow-Up: Your physician wants you to follow-up in: 1 year with Dr. Tamala Julian.  You will receive a reminder letter in the mail two months in advance. If you don't receive a letter, please call our office to schedule the follow-up appointment.   Any Other Special Instructions Will Be Listed Below (If Applicable).     If you need a refill on your cardiac medications before your next appointment, please call your pharmacy.      Signed, Rachel Grooms, MD  10/05/2016 2:07 PM    [facility] [address], [place], [place]  [address] Phone: [phone]; Fax: [phone]

## 2016-10-05 ENCOUNTER — Ambulatory Visit (INDEPENDENT_AMBULATORY_CARE_PROVIDER_SITE_OTHER): Payer: Medicare Other | Admitting: Interventional Cardiology

## 2016-10-05 ENCOUNTER — Encounter: Payer: Self-pay | Admitting: Interventional Cardiology

## 2016-10-05 VITALS — BP 112/60 | HR 67 | Ht 68.5 in | Wt 192.4 lb

## 2016-10-05 DIAGNOSIS — R0609 Other forms of dyspnea: Secondary | ICD-10-CM

## 2016-10-05 DIAGNOSIS — E785 Hyperlipidemia, unspecified: Secondary | ICD-10-CM | POA: Diagnosis not present

## 2016-10-05 DIAGNOSIS — I10 Essential (primary) hypertension: Secondary | ICD-10-CM

## 2016-10-05 DIAGNOSIS — I2510 Atherosclerotic heart disease of native coronary artery without angina pectoris: Secondary | ICD-10-CM | POA: Diagnosis not present

## 2016-10-05 NOTE — Patient Instructions (Signed)

## 2017-03-15 ENCOUNTER — Other Ambulatory Visit: Payer: Self-pay | Admitting: Interventional Cardiology

## 2017-04-30 ENCOUNTER — Ambulatory Visit: Payer: Medicare Other | Admitting: Hematology and Oncology

## 2017-04-30 ENCOUNTER — Encounter: Payer: Self-pay | Admitting: Obstetrics & Gynecology

## 2017-04-30 ENCOUNTER — Other Ambulatory Visit (HOSPITAL_COMMUNITY)
Admission: RE | Admit: 2017-04-30 | Discharge: 2017-04-30 | Disposition: A | Discharge status: Home / Self Care | Payer: Medicare Other | Source: Ambulatory Visit | Attending: Obstetrics & Gynecology | Admitting: Obstetrics & Gynecology

## 2017-04-30 ENCOUNTER — Ambulatory Visit (INDEPENDENT_AMBULATORY_CARE_PROVIDER_SITE_OTHER): Payer: Medicare Other | Admitting: Obstetrics & Gynecology

## 2017-04-30 ENCOUNTER — Other Ambulatory Visit: Payer: Self-pay

## 2017-04-30 VITALS — BP 116/60 | HR 80 | Resp 16 | Ht 66.5 in | Wt 191.0 lb

## 2017-04-30 DIAGNOSIS — Z124 Encounter for screening for malignant neoplasm of cervix: Secondary | ICD-10-CM

## 2017-04-30 DIAGNOSIS — Z01419 Encounter for gynecological examination (general) (routine) without abnormal findings: Secondary | ICD-10-CM | POA: Diagnosis not present

## 2017-04-30 MED ORDER — MIRABEGRON ER 25 MG PO TB24: 25.0000 mg | ORAL_TABLET | Freq: Every day | ORAL | 12 refills | Status: DC

## 2017-04-30 MED ORDER — NONFORMULARY OR COMPOUNDED ITEM: 4 refills | Status: DC

## 2017-04-30 MED ORDER — MIRABEGRON ER 25 MG PO TB24: 25.0000 mg | ORAL_TABLET | Freq: Every day | ORAL | 4 refills | Status: DC

## 2017-04-30 NOTE — Progress Notes (Signed)
Prescription for vitamin E suppositories faxed to [facility]. Fax #: [phone].

## 2017-04-30 NOTE — Progress Notes (Signed)
77 y.o. [id number] WidowedCaucasianF here for annual exam.  Had lung nodule discovered last year.  Several tests were done.  Saw several providers--cardiothoracic surgeon, cardiologist.  Had pulmonary function tests.  Dr. Reynaldo Minium things she has early COPD.  Medications were changed.  She is doing better from breathing standpoint.  Had echo that was good as well.  Denies vaginal bleeding.    Had to stop OAB medication due to cost.  Would like to try something else if possible.    PCP:  Dr. Reynaldo Minium.  Has appt/lab work scheduled in the next two weeks.    Patient's last menstrual period was 02/27/2008.          Sexually active: No.  The current method of family planning is post menopausal status.    Exercising: Yes.    exercise balance and stretch  Smoker:  Former smoker  Health Maintenance: Pap:  06/22/14 neg  History of abnormal Pap:  no MMG:  01/24/16 [place] right BIRADS2:benign. Recommended annual MMG.  Pt has appt scheduled for 3 weeks. Colonoscopy:  2009 f/u 10 years  BMD:  With PCP-  Dr Reynaldo Minium  TDaP:  PCP Pneumonia vaccine(s):  Done with PCP Shingrix:   Done with PCP  Hep C testing: not needed Screening Labs: PCP does lab   reports that she quit smoking about 19 years ago. Her smoking use included cigarettes. She has a 67.50 pack-year smoking history. She uses smokeless tobacco. She reports that she drinks about 0.6 - 1.2 oz of alcohol per week. She reports that she does not use drugs.  Past Medical History:  Diagnosis Date  . Anxiety   . Arthritis   . Back pain    Lower Back pain - takes Cortisone shots  . Bone spur 2009   lower back  . Breast cancer ([place]) 2015   finished radiation 12/15  . CAD (coronary artery disease)    a. cath 12/2010 for cardiac arrest showed   . Cataract   . Complication of anesthesia    throat irrition from past intubations, intubation granulomas  . Coronary artery disease   . History of cardiac arrest 12/2010  . Hyperlipemia   . Hypertension   . MI  (myocardial infarction) ([place]) 12/2010   Was followed by Dr. Pernell Dupre  . Radiation 01/06/14-02/04/14   left breast   . Rosacea 2008   both eyes    Past Surgical History:  Procedure Laterality Date  . BACK SURGERY    . BREAST BIOPSY  8/10   fibrocystic changes  . BREAST LUMPECTOMY WITH NEEDLE LOCALIZATION AND AXILLARY SENTINEL LYMPH NODE BX Left 11/13/2013   Procedure: NEEDLE LOCALIZATION LEFT BREAST LUMPECTOMY WITH LEFT  AXILLARY SENTINEL LYMPH NODE BX;  Surgeon: Excell Seltzer, MD;  Location: [facility];  Service: General;  Laterality: Left;  . CATARACT EXTRACTION     both  . [facility] approx 2007  . heart stent     failed-no stents  . HYSTEROSCOPY  9/10   D&C (polyp)  . LEFT HEART CATHETERIZATION WITH CORONARY ANGIOGRAM N/A 01/18/2011   Procedure: LEFT HEART CATHETERIZATION WITH CORONARY ANGIOGRAM;  Surgeon: Clent Demark, MD;  Location[facility] CATH LAB;  Service: Cardiovascular;  Laterality: N/A;  . PERCUTANEOUS CORONARY STENT INTERVENTION (PCI-S) N/A 01/18/2011   Procedure: PERCUTANEOUS CORONARY STENT INTERVENTION (PCI-S);  Surgeon: Clent Demark, MD;  Location: [facility] CATH LAB;  Service: Cardiovascular;  Laterality: N/A;  . TONSILLECTOMY AND ADENOIDECTOMY    .  TUBAL LIGATION      Current Outpatient Medications  Medication Sig Dispense Refill  . albuterol (PROVENTIL HFA;VENTOLIN HFA) 108 (90 BASE) MCG/ACT inhaler Inhale 2 puffs into the lungs daily as needed for wheezing or shortness of breath.    . anastrozole (ARIMIDEX) 1 MG tablet Take 1 tablet (1 mg total) by mouth daily. 90 tablet 3  . Artificial Tear Solution (SOOTHE HYDRATION OP) Place 1 drop into both eyes daily as needed (dry eyes).    Marland Kitchen aspirin EC 81 MG tablet Take 81 mg by mouth daily with supper.    Marland Kitchen atorvastatin (LIPITOR) 40 MG tablet Take 40 mg by mouth at bedtime.     . Budesonide-Formoterol Fumarate (SYMBICORT IN) Inhale 2 puffs into the lungs 2 (two) times daily.    .  hydrochlorothiazide (HYDRODIURIL) 25 MG tablet Take 25 mg by mouth daily.     Marland Kitchen HYDROcodone-acetaminophen (NORCO/VICODIN) 5-325 MG per tablet Take 1 tablet by mouth daily. 1/2 tab in am and 1/2 tab night    . lisinopril (PRINIVIL,ZESTRIL) 40 MG tablet Take 40 mg by mouth daily.     Marland Kitchen LORazepam (ATIVAN) 1 MG tablet Take 1 mg by mouth at bedtime.    . metoprolol (LOPRESSOR) 50 MG tablet Take 50 mg by mouth 2 (two) times daily.    . Multiple Vitamins-Minerals (PRESERVISION/LUTEIN PO) Take 1 tablet by mouth 2 (two) times daily.     Marland Kitchen NITROSTAT 0.4 MG SL tablet DISSOLVE ONE TABLET UNDER THE TONGUE AS NEEDED FOR CHEST PAIN AS DIRECTED 25 tablet 2  . Vitamin D, Ergocalciferol, (DRISDOL) 50000 units CAPS capsule TAKE ONE CAPSULE (50 000 UNITS) BY MOUTHEVERY 14 DAYS 12 capsule 1   No current facility-administered medications for this visit.     Family History  Problem Relation Age of Onset  . Congestive Heart Failure Father   . CVA Father        MI, CAD  . Coronary artery disease Father   . Valvular heart disease Mother   . Hypertension Mother   . Rectal cancer Sister   . Breast cancer Sister   . Breast cancer Maternal Grandmother 90  . Breast cancer Maternal Aunt   . Cancer Sister     ROS:  Pertinent items are noted in HPI.  Otherwise, a comprehensive ROS was negative.  Exam:   BP 116/60 (BP Location: Right Arm, Patient Position: Sitting, Cuff Size: Normal)   Pulse 80   Resp 16   Ht 5' 6.5" (1.689 m)   Wt 191 lb (86.6 kg)   LMP 02/27/2008   BMI 30.37 kg/m      Height: 5' 6.5" (168.9 cm)  Ht Readings from Last 3 Encounters:  04/30/17 5' 6.5" (1.689 m)  10/05/16 5' 8.5" (1.74 m)  08/24/16 5\' 7"  (1.702 m)    General appearance: alert, cooperative and appears stated age Head: Normocephalic, without obvious abnormality, atraumatic Neck: no adenopathy, supple, symmetrical, trachea midline and thyroid normal to inspection and palpation Lungs: clear to auscultation  bilaterally Breasts: normal appearance, no masses or tenderness  on the right, left breast without masses, stable radiation changes on mid portion of inferior side of breast, no LAD Heart: regular rate and rhythm Abdomen: soft, non-tender; bowel sounds normal; no masses,  no organomegaly Extremities: extremities normal, atraumatic, no cyanosis or edema Skin: Skin color, texture, turgor normal. No rashes or lesions Lymph nodes: Cervical, supraclavicular, and axillary nodes normal. No abnormal inguinal nodes palpated Neurologic: Grossly normal   Pelvic: External  genitalia:  no lesions              Urethra:  normal appearing urethra with no masses, tenderness or lesions              Bartholins and Skenes: normal                 Vagina: normal appearing vagina with normal color and discharge, no lesions              Cervix: no lesions              Pap taken: Yes.   Bimanual Exam:  Uterus:  normal size, contour, position, consistency, mobility, non-tender              Adnexa: normal adnexa and no mass, fullness, tenderness               Rectovaginal: Confirms               Anus:  normal sphincter tone, no lesions  Chaperone was present for exam.  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  On anastrozole for 5 years total. Early COPD  P:   Mammogram guidelines reviewed.  Aware is due.   pap smear obtained today Vit D 50K every 2 weeks.  #6/4RF. Trial of Vit E vaginal suppositories pv two to three times weekly. Lab work and vaccines are UTD per pt.  I do not have documentation regarding this. Reviewed colorectal cancer screening with pt.  Had to due virtual in 2009 due to difficulty with colonoscopy.  Dr. Reynaldo Minium has recommended not doing another one and I am in agreement with this. return annually or prn

## 2017-05-02 ENCOUNTER — Ambulatory Visit: Payer: Medicare Other | Admitting: Hematology and Oncology

## 2017-05-02 LAB — CYTOLOGY - PAP: Diagnosis: NEGATIVE

## 2017-05-07 ENCOUNTER — Telehealth: Payer: Self-pay | Admitting: Obstetrics & Gynecology

## 2017-05-07 NOTE — Telephone Encounter (Signed)
Spoke with patient who states she has taken 4 doses of Myrbetriq 25 mg. Yesterday when walking to her apartment had racing heart. Took BP which was 155/74. Last BP in our office was 116/60. History of heart attack takes BP medication. Concerned Myrbetriq is increasing her BP. Advised to discontinue medication at this time. Patient is agreeable. States does not desire alternative. Advised will review with Dr.Miller and return call with any additional information.

## 2017-05-07 NOTE — Telephone Encounter (Signed)
Spoke with patient. Advised of message as seen below from Dr.Miller. Patient verbalizes understanding. Encounter closed. 

## 2017-05-07 NOTE — Telephone Encounter (Signed)
Patient stopped taking the Myrebetric due to a rapid heartbeat. Patient is asking if this is okay to stop taking?

## 2017-05-07 NOTE — Telephone Encounter (Signed)
No additional recommendations are needed as long as her blood pressure lowers and she feels normal after stopping.  Please have her continue to monitor this but it should resolve within a few days.  Thanks.

## 2017-05-10 ENCOUNTER — Other Ambulatory Visit: Payer: Self-pay

## 2017-05-10 DIAGNOSIS — C50312 Malignant neoplasm of lower-inner quadrant of left female breast: Secondary | ICD-10-CM

## 2017-05-10 MED ORDER — ANASTROZOLE 1 MG PO TABS: 1.0000 mg | ORAL_TABLET | Freq: Every day | ORAL | 0 refills | Status: DC

## 2017-05-15 ENCOUNTER — Telehealth: Payer: Self-pay

## 2017-05-15 NOTE — Telephone Encounter (Signed)
Patient called concerned that she only received a 30 day supply for her anastrozole. Informed her this was due to it being time for her yearly and she has had two previous canceled appointments. We scheduled a follow up with Dr. Lindi Adie for April 30th. Patient aware of date/time.  Cyndia Bent RN

## 2017-05-29 ENCOUNTER — Other Ambulatory Visit: Payer: Self-pay | Admitting: Hematology and Oncology

## 2017-05-29 DIAGNOSIS — Z853 Personal history of malignant neoplasm of breast: Secondary | ICD-10-CM

## 2017-05-29 DIAGNOSIS — Z1231 Encounter for screening mammogram for malignant neoplasm of breast: Secondary | ICD-10-CM

## 2017-06-06 ENCOUNTER — Ambulatory Visit
Admission: RE | Admit: 2017-06-06 | Discharge: 2017-06-06 | Disposition: A | Discharge status: Home / Self Care | Payer: Medicare Other | Source: Ambulatory Visit | Attending: Hematology and Oncology | Admitting: Hematology and Oncology

## 2017-06-06 DIAGNOSIS — Z853 Personal history of malignant neoplasm of breast: Secondary | ICD-10-CM

## 2017-06-07 ENCOUNTER — Other Ambulatory Visit: Payer: Self-pay | Admitting: Hematology and Oncology

## 2017-06-07 DIAGNOSIS — C50312 Malignant neoplasm of lower-inner quadrant of left female breast: Secondary | ICD-10-CM

## 2017-06-18 ENCOUNTER — Other Ambulatory Visit: Payer: Self-pay | Admitting: Cardiothoracic Surgery

## 2017-06-18 DIAGNOSIS — R911 Solitary pulmonary nodule: Secondary | ICD-10-CM

## 2017-06-25 ENCOUNTER — Telehealth: Payer: Self-pay | Admitting: Hematology and Oncology

## 2017-06-25 ENCOUNTER — Inpatient Hospital Stay: Payer: Medicare Other | Attending: Hematology and Oncology | Admitting: Hematology and Oncology

## 2017-06-25 DIAGNOSIS — N951 Menopausal and female climacteric states: Secondary | ICD-10-CM | POA: Diagnosis not present

## 2017-06-25 DIAGNOSIS — C50312 Malignant neoplasm of lower-inner quadrant of left female breast: Secondary | ICD-10-CM | POA: Diagnosis not present

## 2017-06-25 DIAGNOSIS — R911 Solitary pulmonary nodule: Secondary | ICD-10-CM | POA: Diagnosis not present

## 2017-06-25 MED ORDER — ANASTROZOLE 1 MG PO TABS: 1.0000 mg | ORAL_TABLET | Freq: Every day | ORAL | 3 refills | Status: DC

## 2017-06-25 NOTE — Telephone Encounter (Signed)
gave avs and calendar °

## 2017-06-25 NOTE — Assessment & Plan Note (Signed)
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 Occasional hot flashes  Breast Cancer Surveillance: 1. Breast exam 06/25/2017: Whitish flaking or crusting around the right nipple 2. Mammogram: 01/24/2016: Stable postsurgical changes in the left breast, no dominant masses or calcifications. breast density category B 3. CT chest 05/22/2016: Enlarging left lower lobe nodule 17 x 17 mm (increased in size from CT scan from 2009)  Lung nodule, solitary Patient had a previous lung nodule in 2006 measuring 9 mm. It has increased to 17 mm Dr. Servando Snare is following this.

## 2017-06-25 NOTE — Progress Notes (Signed)
Patient Care Team: Burnard Bunting, MD as PCP - General (Internal Medicine) Nicholas Lose, MD as Consulting Physician (Hematology and Oncology) Charolette Forward, MD as Consulting Physician (Cardiology) Grace Isaac, MD as Consulting Physician (Cardiothoracic Surgery)  DIAGNOSIS:  Encounter Diagnosis  Name Primary?  . 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  She is tolerating anastrozole extremely well.  She has occasional hot flashes but denies any arthralgias or myalgias.  She had a lung nodule which is being followed by Dr. Servando Snare.  REVIEW OF SYSTEMS:   Constitutional: Denies fevers, chills or abnormal weight loss Eyes: Denies blurriness of vision Ears, nose, mouth, throat, and face: Denies mucositis or sore throat Respiratory: Denies cough, dyspnea or wheezes Cardiovascular: Denies palpitation, chest discomfort Gastrointestinal:  Denies nausea, heartburn or change in bowel habits Skin: Denies abnormal skin rashes Lymphatics: Denies new lymphadenopathy or easy bruising Neurological:Denies numbness, tingling or new weaknesses Behavioral/Psych: Mood is stable, no new  changes  Extremities: No lower extremity edema Breast:  denies any pain or lumps or nodules in either breasts All other systems were reviewed with the patient and are negative.  I have reviewed the past medical history, past surgical history, social history and family history with the patient and they are unchanged from previous note.  ALLERGIES:  is allergic to dilaudid [hydromorphone hcl] and penicillins.  MEDICATIONS:  Current Outpatient Medications  Medication Sig Dispense Refill  . albuterol (PROVENTIL HFA;VENTOLIN HFA) 108 (90 BASE) MCG/ACT inhaler Inhale 2 puffs into the lungs daily as needed for wheezing or shortness of breath.    . anastrozole (ARIMIDEX) 1 MG tablet Take 1 tablet (1 mg total) by mouth daily. 90 tablet 3  . Artificial Tear Solution (SOOTHE HYDRATION OP) Place 1 drop into both eyes daily as needed (dry eyes).    Marland Kitchen aspirin EC 81 MG tablet Take 81 mg by mouth daily with supper.    Marland Kitchen atorvastatin (LIPITOR) 40 MG tablet Take 40 mg by mouth at bedtime.     . Budesonide-Formoterol Fumarate (SYMBICORT IN) Inhale 2 puffs into the lungs 2 (two) times daily.    . hydrochlorothiazide (HYDRODIURIL) 25 MG tablet Take 25 mg by mouth daily.     Marland Kitchen HYDROcodone-acetaminophen (NORCO/VICODIN) 5-325 MG per tablet Take 1 tablet by mouth daily. 1/2 tab in am and 1/2 tab night    . lisinopril (PRINIVIL,ZESTRIL) 40 MG tablet Take 40 mg by mouth daily.     Marland Kitchen LORazepam (ATIVAN) 1 MG tablet Take 1 mg by mouth at bedtime.    . metoprolol (LOPRESSOR) 50 MG tablet Take 50 mg by mouth 2 (two) times  daily.    . Multiple Vitamins-Minerals (PRESERVISION/LUTEIN PO) Take 1 tablet by mouth 2 (two) times daily.     Marland Kitchen NITROSTAT 0.4 MG SL tablet DISSOLVE ONE TABLET UNDER THE TONGUE AS NEEDED FOR CHEST PAIN AS DIRECTED 25 tablet 2  . NONFORMULARY OR COMPOUNDED ITEM Vit E vaginal suppositories '200mg'$ /ml.  One pv three times weekly. 36 each 4  . Vitamin D, Ergocalciferol, (DRISDOL) 50000 units CAPS capsule TAKE  ONE CAPSULE (50 000 UNITS) BY MOUTHEVERY 14 DAYS 12 capsule 1   No current facility-administered medications for this visit.     PHYSICAL EXAMINATION: ECOG PERFORMANCE STATUS: 1 - Symptomatic but completely ambulatory  Vitals:   06/25/17 1417  BP: 125/60  Pulse: 66  Resp: 17  Temp: 97.9 F (36.6 C)  SpO2: 97%   Filed Weights   06/25/17 1417  Weight: 191 lb (86.6 kg)    GENERAL:alert, no distress and comfortable SKIN: skin color, texture, turgor are normal, no rashes or significant lesions EYES: normal, Conjunctiva are pink and non-injected, sclera clear OROPHARYNX:no exudate, no erythema and lips, buccal mucosa, and tongue normal  NECK: supple, thyroid normal size, non-tender, without nodularity LYMPH:  no palpable lymphadenopathy in the cervical, axillary or inguinal LUNGS: clear to auscultation and percussion with normal breathing effort HEART: regular rate & rhythm and no murmurs and no lower extremity edema ABDOMEN:abdomen soft, non-tender and normal bowel sounds MUSCULOSKELETAL:no cyanosis of digits and no clubbing  NEURO: alert & oriented x 3 with fluent speech, no focal motor/sensory deficits EXTREMITIES: No lower extremity edema  LABORATORY DATA:  I have reviewed the data as listed CMP Latest Ref Rng & Units 11/09/2013 12/17/2012 12/16/2012  Glucose 70 - 99 mg/dL 100(H) 126(H) 196(H)  BUN 6 - 23 mg/dL 16 23 -  Creatinine 0.50 - 1.10 mg/dL 0.78 0.84 -  Sodium 137 - 147 mEq/L 142 137 138  Potassium 3.7 - 5.3 mEq/L 4.1 3.8 3.4(L)  Chloride 96 - 112 mEq/L 100 103 -  CO2 19 - 32 mEq/L 27 25 -  Calcium 8.4 - 10.5 mg/dL 9.3 7.5(L) -  Total Protein 6.0 - 8.3 g/dL - - -  Total Bilirubin 0.3 - 1.2 mg/dL - - -  Alkaline Phos 39 - 117 U/L - - -  AST 0 - 37 U/L - - -  ALT 0 - 35 U/L - - -    Lab Results  Component Value Date   WBC 8.8 01/25/2014   HGB 14.7 01/25/2014   HCT 45.6 01/25/2014   MCV 94.0 01/25/2014   PLT 175 12/19/2012   NEUTROABS 7.9 (H) 01/29/2011      ASSESSMENT & PLAN:  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 Occasional hot flashes  Breast Cancer Surveillance: 1. Breast exam patient was examined by Dr. Sabra Heck 2. Mammogram: December 2018 3. CT chest 05/22/2016: Enlarging left lower lobe nodule 17 x 17 mm (increased in size from CT scan from 2009)  Lung nodule, solitary Patient had a previous lung nodule in 2006 measuring 9 mm. It has increased to 17 mm Dr. Servando Snare is following this.  Return to clinic in 1 year for follow-up   No orders of  the defined types were placed in this encounter.  The patient has a good understanding of the overall plan. she agrees with it. she will call with any problems that may develop before the next visit here.   Harriette Ohara, MD 06/25/17

## 2017-07-16 ENCOUNTER — Encounter: Payer: Self-pay | Admitting: Cardiothoracic Surgery

## 2017-07-16 ENCOUNTER — Ambulatory Visit
Admission: RE | Admit: 2017-07-16 | Discharge: 2017-07-16 | Disposition: A | Discharge status: Home / Self Care | Payer: Medicare Other | Source: Ambulatory Visit | Attending: Cardiothoracic Surgery | Admitting: Cardiothoracic Surgery

## 2017-07-16 ENCOUNTER — Other Ambulatory Visit: Payer: Self-pay

## 2017-07-16 ENCOUNTER — Ambulatory Visit: Payer: Medicare Other | Admitting: Cardiothoracic Surgery

## 2017-07-16 VITALS — BP 114/66 | HR 68 | Resp 18 | Ht 66.5 in | Wt 189.0 lb

## 2017-07-16 DIAGNOSIS — R911 Solitary pulmonary nodule: Secondary | ICD-10-CM

## 2017-07-16 NOTE — Progress Notes (Signed)
[address]       [place] [address]             [phone]                    Rachel Chase  Medical Record #[id number] Date of Birth: 09/05/1940  Referring: Burnard Bunting, MD Primary Care: Burnard Bunting, MD  Chief Complaint:    Chief Complaint  Patient presents with  . Lung Lesion    1 year f/u with chest CT today     History of Present Illness:    Rachel Chase 77 y.o. female is seen in the office  today for Left lower lobe lung nodule.  I first saw the patient in January 2006  for a 7 mm left lower lobe pulmonary nodule that was evident on a film the year before that. She was followed for several years with minimal change in lesion since that time the patient had acute at-home cardiac arrest thanksgiving 5 years ago no stents were placed she's currently followed by Dr. Daneen Schick for coronary artery disease. 2 years ago she was diagnosed with left breast cancer treated with lumpectomy no dust node sampling and radiation therapy she's now on anti-estrogen therapy.  Pulmonary function studies CT of the chest and PET scan are reviewed. Since first seen the patient had continued deterioration in her pulmonary function studies in 2006 her FEV1 was measured at 1.28 now her FEV1 is  .89  35 % of predicted and a diffusion capacity of 30% dictated she does have significant improvement with bronchodilator therapy .  He comes in today with a follow-up CT scan, considering the significant pulmonary disease evidenced by her pulmonary function studies she remains remarkably active.  Current Activity/ Functional Status:  Patient is independent with mobility/ambulation, transfers, ADL's, IADL's.   Zubrod Score: At the time of surgery this patient's most appropriate activity status/level should be described as: []     0    Normal activity, no symptoms [x]     1    Restricted in physical strenuous activity but ambulatory, able to do out light work []      2    Ambulatory and capable of self care, unable to do work activities, up and about               >50 % of waking hours                              []     3    Only limited self care, in bed greater than 50% of waking hours []     4    Completely disabled, no self care, confined to bed or chair []     5    Moribund   Past Medical History:  Diagnosis Date  . Anxiety   . Arthritis   . Back pain    Lower Back pain - takes Cortisone shots  . Bone spur 2009   lower back  . Breast cancer ([facility]) 2015   finished radiation 12/15  . CAD (coronary artery disease)    a. cath 12/2010 for cardiac arrest showed   . Cataract   . Complication of anesthesia    throat irrition from past intubations, intubation granulomas  . Coronary artery disease   . History of cardiac arrest 12/2010  . Hyperlipemia   . Hypertension   .  MI (myocardial infarction) ([facility]) 12/2010   Was followed by Dr. Pernell Dupre  . Radiation 01/06/14-02/04/14   left breast   . Rosacea 2008   both eyes    Past Surgical History:  Procedure Laterality Date  . BACK SURGERY    . BREAST BIOPSY  8/10   fibrocystic changes  . BREAST LUMPECTOMY WITH NEEDLE LOCALIZATION AND AXILLARY SENTINEL LYMPH NODE BX Left 11/13/2013   Procedure: NEEDLE LOCALIZATION LEFT BREAST LUMPECTOMY WITH LEFT  AXILLARY SENTINEL LYMPH NODE BX;  Surgeon: Excell Seltzer, MD;  Location: [facility];  Service: General;  Laterality: Left;  . CATARACT EXTRACTION     both  . [facility] approx 2007  . heart stent     failed-no stents  . HYSTEROSCOPY  9/10   D&C (polyp)  . LEFT HEART CATHETERIZATION WITH CORONARY ANGIOGRAM N/A 01/18/2011   Procedure: LEFT HEART CATHETERIZATION WITH CORONARY ANGIOGRAM;  Surgeon: Clent Demark, MD;  Location: [facility] CATH LAB;  Service: Cardiovascular;  Laterality: N/A;  . PERCUTANEOUS CORONARY STENT INTERVENTION (PCI-S) N/A 01/18/2011   Procedure: PERCUTANEOUS CORONARY STENT INTERVENTION (PCI-S);   Surgeon: Clent Demark, MD;  Location: [facility] CATH LAB;  Service: Cardiovascular;  Laterality: N/A;  . TONSILLECTOMY AND ADENOIDECTOMY    . TUBAL LIGATION      Family History  Problem Relation Age of Onset  . Congestive Heart Failure Father   . CVA Father        MI, CAD  . Coronary artery disease Father   . Valvular heart disease Mother   . Hypertension Mother   . Rectal cancer Sister   . Breast cancer Sister   . Breast cancer Maternal Grandmother 90  . Breast cancer Maternal Aunt   . Cancer Sister     Social History   Socioeconomic History  . Marital status: Widowed    Spouse name: Not on file  . Number of children: 3  . Years of education: Not on file  . Highest education level: Not on file  Occupational History  . Not on file  Social Needs  . Financial resource strain: Not on file  . Food insecurity:    Worry: Not on file    Inability: Not on file  . Transportation needs:    Medical: Not on file    Non-medical: Not on file  Tobacco Use  . Smoking status: Former Smoker    Packs/day: 1.50    Years: 45.00    Pack years: 67.50    Types: Cigarettes    Last attempt to quit: 04/17/1998    Years since quitting: 19.2  . Smokeless tobacco: Current User  . Tobacco comment: nicorette gum  Substance and Sexual Activity  . Alcohol use: Yes    Alcohol/week: 0.6 - 1.2 oz    Types: 1 - 2 Standard drinks or equivalent per week    Comment: occ  . Drug use: No  . Sexual activity: Never    Partners: Male    Birth control/protection: Abstinence  Lifestyle  . Physical activity:    Days per week: Not on file    Minutes per session: Not on file  . Stress: Not on file  Relationships  . Social connections:    Talks on phone: Not on file    Gets together: Not on file    Attends religious service: Not on file    Active member of club or organization: Not on file  Attends meetings of clubs or organizations: Not on file    Relationship status: Not on file  . Intimate partner  violence:    Fear of current or ex partner: Not on file    Emotionally abused: Not on file    Physically abused: Not on file    Forced sexual activity: Not on file  Other Topics Concern  . Not on file  Social History Narrative  . Not on file    Social History   Tobacco Use  Smoking Status Former Smoker  . Packs/day: 1.50  . Years: 45.00  . Pack years: 67.50  . Types: Cigarettes  . Last attempt to quit: 04/17/1998  . Years since quitting: 19.2  Smokeless Tobacco Current User  Tobacco Comment   nicorette gum    Social History   Substance and Sexual Activity  Alcohol Use Yes  . Alcohol/week: 0.6 - 1.2 oz  . Types: 1 - 2 Standard drinks or equivalent per week   Comment: occ     Allergies  Allergen Reactions  . Dilaudid [Hydromorphone Hcl] Nausea And Vomiting  . Penicillins Rash    Current Outpatient Medications  Medication Sig Dispense Refill  . albuterol (PROVENTIL HFA;VENTOLIN HFA) 108 (90 BASE) MCG/ACT inhaler Inhale 2 puffs into the lungs daily as needed for wheezing or shortness of breath.    . anastrozole (ARIMIDEX) 1 MG tablet Take 1 tablet (1 mg total) by mouth daily. 90 tablet 3  . Artificial Tear Solution (SOOTHE HYDRATION OP) Place 1 drop into both eyes daily as needed (dry eyes).    Marland Kitchen aspirin EC 81 MG tablet Take 81 mg by mouth daily with supper.    Marland Kitchen atorvastatin (LIPITOR) 40 MG tablet Take 40 mg by mouth at bedtime.     . Budesonide-Formoterol Fumarate (SYMBICORT IN) Inhale 2 puffs into the lungs 2 (two) times daily.    . hydrochlorothiazide (HYDRODIURIL) 25 MG tablet Take 25 mg by mouth daily.     Marland Kitchen HYDROcodone-acetaminophen (NORCO/VICODIN) 5-325 MG per tablet Take 1 tablet by mouth daily. 1/2 tab in am and 1/2 tab night    . lisinopril (PRINIVIL,ZESTRIL) 40 MG tablet Take 40 mg by mouth daily.     Marland Kitchen LORazepam (ATIVAN) 1 MG tablet Take 1 mg by mouth at bedtime.    . metoprolol (LOPRESSOR) 50 MG tablet Take 50 mg by mouth 2 (two) times daily.    .  Multiple Vitamins-Minerals (PRESERVISION/LUTEIN PO) Take 1 tablet by mouth 2 (two) times daily.     Marland Kitchen NITROSTAT 0.4 MG SL tablet DISSOLVE ONE TABLET UNDER THE TONGUE AS NEEDED FOR CHEST PAIN AS DIRECTED 25 tablet 2  . NONFORMULARY OR COMPOUNDED ITEM Vit E vaginal suppositories 200mg /ml.  One pv three times weekly. 36 each 4  . Vitamin D, Ergocalciferol, (DRISDOL) 50000 units CAPS capsule TAKE ONE CAPSULE (50 000 UNITS) BY MOUTHEVERY 14 DAYS 12 capsule 1   No current facility-administered medications for this visit.       Review of Systems:     Cardiac Review of Systems: Y or N  Chest Pain [ n   ]  Resting SOB Blue.Reese ] Exertional SOB  [ y ]  Orthopnea [ n ]   Pedal Edema [ n  ]    Palpitations [n  ] Syncope  [n  ]   Presyncope [ n  ]  General Review of Systems: [Y] = yes [  ]=no Constitional: recent weight change [  ];  Wt loss over  the last 3 months [   ] anorexia [  ]; fatigue [  ]; nausea [  ]; night sweats [  ]; fever [ ] ; or chills [  ];          Dental: poor dentition[  ]; Last Dentist visit:   Eye : blurred vision [  ]; diplopia [   ]; vision changes [  ];  Amaurosis fugax[  ]; Resp: cough [ y ];  wheezing[ y ];  hemoptysis[n  ]; shortness of breath[y  ]; paroxysmal nocturnal dyspnea[ n ]; dyspnea on exertion[y  ]; or orthopnea[  ];  GI:  gallstones[  ], vomiting[  ];  dysphagia[  ]; melena[  ];  hematochezia [  ]; heartburn[  ];   Hx of  Colonoscopy[  ]; GU: kidney stones [  ]; hematuria[  ];   dysuria [  ];  nocturia[  ];  history of     obstruction [  ]; urinary frequency [  ]             Skin: rash, swelling[  ];, hair loss[  ];  peripheral edema[  ];  or itching[  ]; Musculosketetal: myalgias[  ];  joint swelling[  ];  joint erythema[  ];  joint pain[  ];  back pain[  ];  Heme/Lymph: bruising[  ];  bleeding[  ];  anemia[  ];  Neuro: TIA[  ];  headaches[  ];  stroke[  ];  vertigo[  ];  seizures[  ];   paresthesias[  ];  difficulty walking[  ];  Psych:depression[  ]; anxiety[   ];  Endocrine: diabetes[  ];  thyroid dysfunction[  ];  Immunizations: Flu up to date [ y ]; Pneumococcal up to date [ y ];  Other:  Physical Exam: BP 114/66 (BP Location: Right Arm, Patient Position: Sitting, Cuff Size: Normal)   Pulse 68   Resp 18   Ht 5' 6.5" (1.689 m)   Wt 189 lb (85.7 kg)   LMP 02/27/2008   SpO2 96% Comment: RA  BMI 30.05 kg/m   PHYSICAL EXAMINATION: General appearance: alert, cooperative and no distress Head: Normocephalic, without obvious abnormality, atraumatic Neck: no adenopathy, no carotid bruit, no JVD, supple, symmetrical, trachea midline and thyroid not enlarged, symmetric, no tenderness/mass/nodules Lymph nodes: Cervical, supraclavicular, and axillary nodes normal. Resp: clear to auscultation bilaterally Cardio: regular rate and rhythm, S1, S2 normal, no murmur, click, rub or gallop GI: soft, non-tender; bowel sounds normal; no masses,  no organomegaly Extremities: extremities normal, atraumatic, no cyanosis or edema and Homans sign is negative, no sign of DVT Neurologic: Grossly normal  Palpable DP and PT pulses  Diagnostic Studies & Laboratory data:     Recent Radiology Findings:   Ct Chest Wo Contrast  Result Date: 07/16/2017 CLINICAL DATA:  Followup pulmonary nodule. EXAM: CT CHEST WITHOUT CONTRAST TECHNIQUE: Multidetector CT imaging of the chest was performed following the standard protocol without IV contrast. COMPARISON:  PET-CT 06/29/2016 and chest CT 05/22/2016 FINDINGS: Cardiovascular: The heart is normal in size and stable. Stable advanced atherosclerotic calcifications involving the thoracic aorta and branch vessels including dense and extensive three-vessel coronary artery calcifications. Mediastinum/Nodes: No mediastinal or hilar mass or lymphadenopathy. The esophagus is grossly normal. Lungs/Pleura: 17 x 16 mm lobulated but smoothly marginated left lower lobe lung lesion is stable. 3 mm adjacent nodule is also stable. Stable  emphysematous changes. No new pulmonary nodules or acute overlying pulmonary process. No interstitial lung disease or  bronchiectasis. Upper Abdomen: No significant upper abdominal findings. Stable advanced atherosclerotic calcifications involving the aorta and branch vessels. Stable cholelithiasis. Musculoskeletal: No breast masses. Stable scarring change in the left medial breast related to prior lumpectomy. No supraclavicular or axillary adenopathy. Surgical clips again noted in the left axilla. No significant bony findings. IMPRESSION: 1. Stable 17 x 16 mm left lower lobe pulmonary nodule since PET-CT 1 year ago. 2. No new pulmonary lesions and no mediastinal or hilar mass or adenopathy. 3. Stable advanced atherosclerotic calcifications involving the thoracic and abdominal aorta and branch vessels and extensive three-vessel coronary artery calcifications. 4. Stable emphysematous changes. 5. Cholelithiasis. Aortic Atherosclerosis (ICD10-I70.0) and Emphysema (ICD10-J43.9). Electronically Signed   By: Marijo Sanes M.D.   On: 07/16/2017 12:58    Nm Pet Image Initial (pi) Skull Base To Thigh  Result Date: 06/29/2016 CLINICAL DATA:  Initial treatment strategy for pulmonary nodule. EXAM: NUCLEAR MEDICINE PET SKULL BASE TO THIGH TECHNIQUE: 9.6 mCi F-18 FDG was injected intravenously. Full-ring PET imaging was performed from the skull base to thigh after the radiotracer. CT data was obtained and used for attenuation correction and anatomic localization. FASTING BLOOD GLUCOSE:  Value: 110 mg/dl COMPARISON:  CT chest dated 05/22/2016. Virtual colonoscopy dated 03/19/2007. 11 x 10 mm on 2009 virtual colonoscopy. FINDINGS: NECK No hypermetabolic lymph nodes in the neck. CHEST No hypermetabolic mediastinal or hilar nodes. Macrolobulated 16 x 15 mm left lower lobe pulmonary nodule (series 8/image 48), max SUV 1.8. This previously measured 11 x 10 mm on 2009 virtual colonoscopy. The extremely slow growth rate and relative  lack of hypermetabolism for lesion size is compatible with a benign nodule such as hamartoma. No suspicious pulmonary nodules. The heart is normal in size. Three vessel coronary atherosclerosis. Atherosclerotic calcifications of the aortic arch. Postsurgical changes in the medial left breast (series 4/image 32). Status post left axillary lymph node dissection. ABDOMEN/PELVIS No abnormal hypermetabolic activity within the liver, pancreas, adrenal glands, or spleen. No hypermetabolic lymph nodes in the abdomen or pelvis. Atherosclerotic calcifications the abdominal aorta and branch vessels. Extensive sigmoid diverticulosis, without evidence of diverticulitis. SKELETON No focal hypermetabolic activity to suggest skeletal metastasis. Degenerative changes of the visualized thoracolumbar spine. Postsurgical changes involving the lumbar spine. 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 This nodule is not considered suspicious for primary bronchogenic neoplasm or metastasis. Dedicated follow-up imaging is not required. Electronically Signed   By: Julian Hy M.D.   On: 06/29/2016 15:32  Study Result   CLINICAL DATA:  History of left breast carcinoma with shortness of breath and wheezing  EXAM: CT CHEST WITH CONTRAST  TECHNIQUE: Multidetector CT imaging of the chest was performed during intravenous contrast administration.  CONTRAST:  63mL ISOVUE-300 IOPAMIDOL (ISOVUE-300) INJECTION 61%  Creatinine was obtained on site at [facility] at [address]  Results: Creatinine 0.9 mg/dL.  COMPARISON:  01/25/2014, 03/19/2007  FINDINGS: Cardiovascular: Atherosclerotic calcifications of thoracic aorta are noted. No aneurysmal dilatation or dissection is noted. Heavy coronary calcifications are seen. No significant  cardiac enlargement is noted. The pulmonary artery as visualized shows no large central pulmonary embolus.  Mediastinum/Nodes: The thoracic inlet is within normal limits. No significant hilar or mediastinal adenopathy is identified. The esophagus as visualized demonstrates some mild thickening circumferentially in the distal aspect which may be related to reflux. Clinical correlation is recommended.  Lungs/Pleura: The lungs are well aerated bilaterally and demonstrate emphysematous change. No focal infiltrate or sizable effusion is seen. Lobular new nodule is noted in the left lower lobe best seen on image number 92 of series 5. This measures 17 x 17 mm in greatest dimension and has increased in size from both the recent chest x-ray from 2015 as well as a prior CT examination of 2009  Upper Abdomen: No focal abnormality is noted.  Musculoskeletal: Degenerative changes of the thoracic spine are noted. Postsurgical changes in the left breast are seen. No acute bony abnormality is noted. Postsurgical changes in the lumbar spine are seen.  IMPRESSION: Enlarging left lower lobe nodule as described. Tissue sampling and/or PET-CT is recommended for further evaluation.  These results will be called to the ordering clinician or representative by the Radiologist Assistant, and communication documented in the PACS or zVision Dashboard.   Electronically Signed   By: Inez Catalina M.D.   On: 05/22/2016 14:44    I have independently reviewed the above radiology studies  and reviewed the findings with the patient.     Recent Lab Findings: Lab Results  Component Value Date   WBC 8.8 01/25/2014   HGB 14.7 01/25/2014   HCT 45.6 01/25/2014   PLT 175 12/19/2012   GLUCOSE 100 (H) 11/09/2013   CHOL 128 01/18/2011   TRIG 103 01/18/2011   HDL 47 01/18/2011   LDLCALC 60 01/18/2011   ALT 156 (H) 01/18/2011   AST 142 (H) 01/18/2011   NA 142 11/09/2013   K 4.1 11/09/2013   CL 100  11/09/2013   CREATININE 0.78 11/09/2013   BUN 16 11/09/2013   CO2 27 11/09/2013   INR 1.57 (H) 01/19/2011    PFT's  fev1 0.89 35 % 28% improvement with bronchdilators DLCO 9.17  30%  Interpretation: The FVC, FEV1, FEV1/FVC ratio and FEF25-75% are reduced indicating airway obstruction. While the TLC is within normal limits, the FRC, RV and RV/TLC ratio are increased. Following administration of bronchodilators, there is a significant response indicated by the increased FVC. The reduced diffusing capacity indicates a severe loss of functional alveolar capillary surface. However, the diffusing capacity was not corrected for the patient's hemoglobin. 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 Stable 17 x 16 mm left lower lobe pulmonary nodule since PET-CT 1 year ago. 2. No new pulmonary lesions and no mediastinal or hilar mass or adenopathy.  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  With the patient's severe COPD and severe diffusion deficit proceeding with surgical resection would be risky  History of known coronary occlusive disease status post out-of-hospital cardiac arrest 5 years ago History of left breast cancer    I discussed with the patient treatment options including biopsy she declines any surgical resection and is happy to have one-year follow-up CT scan. Plan to see her back in 1 year follow-up CT of the chest   Grace Isaac MD      [address] [place] [address] Office [phone]   Beeper [phone]  07/16/2017 2:37 PM

## 2018-01-31 ENCOUNTER — Other Ambulatory Visit (HOSPITAL_COMMUNITY): Payer: Self-pay | Admitting: Internal Medicine

## 2018-01-31 DIAGNOSIS — M545 Low back pain, unspecified: Secondary | ICD-10-CM

## 2018-01-31 DIAGNOSIS — M4300 Spondylolysis, site unspecified: Secondary | ICD-10-CM

## 2018-02-05 ENCOUNTER — Ambulatory Visit (HOSPITAL_COMMUNITY)
Admission: RE | Admit: 2018-02-05 | Discharge: 2018-02-05 | Disposition: A | Discharge status: Home / Self Care | Payer: Medicare Other | Source: Ambulatory Visit | Attending: Internal Medicine | Admitting: Internal Medicine

## 2018-02-05 DIAGNOSIS — M5136 Other intervertebral disc degeneration, lumbar region: Secondary | ICD-10-CM | POA: Diagnosis not present

## 2018-02-05 DIAGNOSIS — M545 Low back pain, unspecified: Secondary | ICD-10-CM

## 2018-02-05 DIAGNOSIS — M47817 Spondylosis without myelopathy or radiculopathy, lumbosacral region: Secondary | ICD-10-CM | POA: Diagnosis present

## 2018-02-05 DIAGNOSIS — M48061 Spinal stenosis, lumbar region without neurogenic claudication: Secondary | ICD-10-CM | POA: Diagnosis not present

## 2018-02-05 DIAGNOSIS — M4300 Spondylolysis, site unspecified: Secondary | ICD-10-CM

## 2018-02-05 DIAGNOSIS — Z981 Arthrodesis status: Secondary | ICD-10-CM | POA: Diagnosis not present

## 2018-05-29 ENCOUNTER — Other Ambulatory Visit: Payer: Self-pay | Admitting: *Deleted

## 2018-05-29 DIAGNOSIS — R911 Solitary pulmonary nodule: Secondary | ICD-10-CM

## 2018-07-01 ENCOUNTER — Ambulatory Visit: Payer: Medicare Other | Admitting: Obstetrics & Gynecology

## 2018-07-01 ENCOUNTER — Ambulatory Visit: Payer: Medicare Other | Admitting: Hematology and Oncology

## 2018-07-04 ENCOUNTER — Telehealth: Payer: Self-pay | Admitting: Hematology and Oncology

## 2018-07-04 NOTE — Assessment & Plan Note (Signed)
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 Occasional hot flashes  Breast Cancer Surveillance: 1. Breast exam patient was examined by Dr. Sabra Heck 2. Mammogram: 06/06/2017: Benign breast density category B 3.CT chest 07/16/2017: Stable left lower lobe nodule 17  mm    Lung nodule, solitary Patient had a previous lung nodule in 2006 measuring 9 mm. It has increased to 71m but it has stabilized on the most recent CT scan Dr. GServando Snareis following this.  Return to clinic in 1 year for follow-up

## 2018-07-04 NOTE — Telephone Encounter (Signed)
Called regarding upcoming [facility] appointment, patient would prefer to use [facility]. Patient is notified.

## 2018-07-04 NOTE — Telephone Encounter (Signed)
Tried to call pt per 5/08 sch message to cancel appt . Unable to leave message. Left appt as is - not sure if pt still wants to cancel if appt is virtual

## 2018-07-07 NOTE — Progress Notes (Signed)
HEMATOLOGY-ONCOLOGY DOXIMITY VISIT PROGRESS NOTE  I connected with Fabiola Backer Dibbern on 07/08/2018 at 10:30 AM EDT by Doximity video conference and verified that I am speaking with the correct person using two identifiers.  I discussed the limitations, risks, security and privacy concerns of performing an evaluation and management service by Doximity and the availability of in person appointments.  I also discussed with the patient that there may be a patient responsible charge related to this service. The patient expressed understanding and agreed to proceed.  Patient's Location: Home Physician Location: Clinic  CHIEF COMPLIANT: Follow-up on anastrozole therapy  INTERVAL HISTORY: [facility] is a 78 y.o. Marland Kitchensex with above-mentioned history of left breast cancer treated with lumpectomy followed by radiation who is currently on anastrozole therapy. I last saw her a year ago. Her most recent mammogram from 06/06/17 showed no evidence of malignancy. A Chest CT from 07/16/17 showed that a pulmonary nodule in the lower left lobe was stable at 17cm. She presents today over Doximity for annual follow-up.  Back issues for which she plans to do some back surgeries. She has not done any mammograms because she does not want to risk getting the coronavirus.  She lives in the nursing home [facility] and apparently all the residents are doing well and she does not want to risk anyone's health.    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 N0 M0 stage IA ER 100% PR 98% Ki-67 6% HER-2 negative ratio 1.56    01/04/2014 - 02/05/2014 Radiation Therapy    Left breast 42.72 gray in 16 fractions; lumpectomy cavity boost 10 gray in 5 fractions, cumulative dose to the lumpectomy cavity 52.72 gray    02/26/2014 -  Anti-estrogen oral therapy    Anastrazole 1 mg daily     REVIEW OF  SYSTEMS:   Constitutional: Denies fevers, chills or abnormal weight loss Eyes: Denies blurriness of vision Ears, nose, mouth, throat, and face: Denies mucositis or sore throat Respiratory: Denies cough, dyspnea or wheezes Cardiovascular: Denies palpitation, chest discomfort Gastrointestinal:  Denies nausea, heartburn or change in bowel habits Skin: Denies abnormal skin rashes Lymphatics: Denies new lymphadenopathy or easy bruising Neurological:Denies numbness, tingling or new weaknesses, complains of chronic back pain Behavioral/Psych: Mood is stable, no new changes  Extremities: No lower extremity edema Breast: denies any pain or lumps or nodules in either breasts All other systems were reviewed with the patient and are negative.  Observations/Objective:  There were no vitals filed for this visit. There is no height or weight on file to calculate BMI.  I have reviewed the data as listed CMP Latest Ref Rng & Units 11/09/2013 12/17/2012 12/16/2012  Glucose 70 - 99 mg/dL 100(H) 126(H) 196(H)  BUN 6 - 23 mg/dL 16 23 -  Creatinine 0.50 - 1.10 mg/dL 0.78 0.84 -  Sodium 137 - 147 mEq/L 142 137 138  Potassium 3.7 - 5.3 mEq/L 4.1 3.8 3.4(L)  Chloride 96 - 112 mEq/L 100 103 -  CO2 19 - 32 mEq/L 27 25 -  Calcium 8.4 - 10.5 mg/dL 9.3 7.5(L) -  Total Protein 6.0 - 8.3 g/dL - - -  Total Bilirubin 0.3 - 1.2 mg/dL - - -  Alkaline Phos 39 - 117 U/L - - -  AST 0 - 37 U/L - - -  ALT 0 - 35 U/L - - -  Lab Results  Component Value Date   WBC 8.8 01/25/2014   HGB 14.7 01/25/2014   HCT 45.6 01/25/2014   MCV 94.0 01/25/2014   PLT 175 12/19/2012   NEUTROABS 7.9 (H) 01/29/2011      Assessment Plan:  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 Occasional hot flashes I renewed her prescription for another year of anastrozole.  Which will complete her 5 years of therapy.  Based on her age and risk features, we could stop her treatment after 5 years unless she insists on continuing for longer.  Breast Cancer Surveillance: 1. Breast exam patient was examined by Dr. Sabra Heck 2. Mammogram: 06/06/2017: Benign breast density category B.  Patient has delayed her mammogram because of coronavirus.  She thinks she may be getting it during the fall time. 3.CT chest 07/16/2017: Stable left lower lobe nodule 17  mm    Lung nodule, solitary Patient had a previous lung nodule in 2006 measuring 9 mm. It has increased to 42m but it has stabilized on the most recent CT scan Dr. GServando Snareis following this.  However patient is going to cancel her appointment with Dr. GPia Mauthis year.  Return to clinic in 1 year for follow-up    I discussed the assessment and treatment plan with the patient. The patient was provided an opportunity to ask questions and all were answered. The patient agreed with the plan and demonstrated an understanding of the instructions. The patient was advised to call back or seek an in-person evaluation if the symptoms worsen or if the condition fails to improve as anticipated.   I provided 15 minutes of face-to-face Doximity time during this encounter.    VRulon Eisenmenger MD 07/08/2018   I, Molly Dorshimer, am acting as scribe for VNicholas Lose MD.  I have reviewed the above documentation for accuracy and completeness, and I agree with the above.

## 2018-07-08 ENCOUNTER — Inpatient Hospital Stay: Payer: Medicare Other | Attending: Hematology and Oncology | Admitting: Hematology and Oncology

## 2018-07-08 DIAGNOSIS — Z923 Personal history of irradiation: Secondary | ICD-10-CM | POA: Diagnosis not present

## 2018-07-08 DIAGNOSIS — C50312 Malignant neoplasm of lower-inner quadrant of left female breast: Secondary | ICD-10-CM

## 2018-07-08 MED ORDER — ANASTROZOLE 1 MG PO TABS: 1.0000 mg | ORAL_TABLET | Freq: Every day | ORAL | 3 refills | Status: DC

## 2018-07-11 ENCOUNTER — Other Ambulatory Visit: Payer: Self-pay | Admitting: *Deleted

## 2018-07-11 MED ORDER — VITAMIN D (ERGOCALCIFEROL) 1.25 MG (50000 UNIT) PO CAPS: ORAL_CAPSULE | ORAL | 1 refills | Status: DC

## 2018-07-11 NOTE — Telephone Encounter (Signed)
Medication refill request: vitamin D Last AEX:  04/30/17 SM Next AEX: none Last MMG (if hormonal medication request): 06/06/17 BIRADS2:benign  Refill authorized: 03/29/16 #12caps/1R. Today please advise.

## 2018-08-14 ENCOUNTER — Other Ambulatory Visit: Payer: Medicare Other

## 2018-08-14 ENCOUNTER — Ambulatory Visit: Payer: Medicare Other | Admitting: Cardiothoracic Surgery

## 2018-09-04 NOTE — Progress Notes (Signed)
78 y.o. G64P2002 Widowed White or Caucasian female here for annual exam.  Doing well.  She is at [facility].  There has not been one Covid infection at [facility].    Has pulmonary nodule 46mm in the lungs that has been monitored by [facility].  He wants her to have another CT this year.  She declined doing it this year.  Will consider doing it this year.  She stopped her Anastrozole last month.  She took it for 4 1/2 years but is just tired of the vaginal soreness and dryness.  Hasn't been off about two weeks.  She is hoping this will help her vulvar symptoms.    Denies vaginal bleeding.    PCP:  [facility].  Has done blood work.    Patient's last menstrual period was 02/27/2008.          Sexually active: No.  The current method of family planning is post menopausal status.    Exercising: Yes.    exercises Smoker:  Former smoker   Health Maintenance: Pap:  04-30-17 negative           06-22-14 negative  History of abnormal Pap:  no MMG:  06-06-17 density B/BIRADS 2 benign  Colonoscopy:  2009 f/u 10 years BMD:   With PCP- [facility]  TDaP:  PCP Pneumonia vaccine(s):  PCP Shingrix:  completed Hep C testing: not needed  Screening Labs: PCP   reports that she quit smoking about 20 years ago. Her smoking use included cigarettes. She has a 67.50 pack-year smoking history. She uses smokeless tobacco. She reports current alcohol use of about 1.0 - 2.0 standard drinks of alcohol per week. She reports that she does not use drugs.  Past Medical History:  Diagnosis Date  . Anxiety   . Arthritis   . Back pain    Lower Back pain - takes Cortisone shots  . Bone spur 2009   lower back  . Breast cancer ([place]) 2015   finished radiation 12/15  . CAD (coronary artery disease)    a. cath 12/2010 for cardiac arrest showed   . Cataract   . Complication of anesthesia    throat irrition from past intubations, intubation granulomas  . Coronary artery disease   . History of cardiac arrest 12/2010   . Hyperlipemia   . Hypertension   . MI (myocardial infarction) ([place]) 12/2010   Was followed by Dr. Pernell Chase  . Radiation 01/06/14-02/04/14   left breast   . Rosacea 2008   both eyes    Past Surgical History:  Procedure Laterality Date  . BACK SURGERY    . BREAST BIOPSY  8/10   fibrocystic changes  . BREAST LUMPECTOMY WITH NEEDLE LOCALIZATION AND AXILLARY SENTINEL LYMPH NODE BX Left 11/13/2013   Procedure: NEEDLE LOCALIZATION LEFT BREAST LUMPECTOMY WITH LEFT  AXILLARY SENTINEL LYMPH NODE BX;  Surgeon: Excell Seltzer, MD;  Location: [facility];  Service: General;  Laterality: Left;  . CATARACT EXTRACTION     both  . [employer] [facility] approx 2007  . heart stent     failed-no stents  . HYSTEROSCOPY  9/10   D&C (polyp)  . LEFT HEART CATHETERIZATION WITH CORONARY ANGIOGRAM N/A 01/18/2011   Procedure: LEFT HEART CATHETERIZATION WITH CORONARY ANGIOGRAM;  Surgeon: Clent Demark, MD;  Location[facility] C[facility] LAB;  Service: Cardiovascular;  Laterality: N/A;  . PERCUTANEOUS CORONARY STENT INTERVENTION (PCI-S) N/A 01/18/2011   Procedure: PERCUTANEOUS CORONARY STENT  INTERVENTION (PCI-S);  Surgeon: Clent Demark, MD;  Location: [facility] CATH LAB;  Service: Cardiovascular;  Laterality: N/A;  . TONSILLECTOMY AND ADENOIDECTOMY    . TUBAL LIGATION      Current Outpatient Medications  Medication Sig Dispense Refill  . albuterol (PROVENTIL HFA;VENTOLIN HFA) 108 (90 BASE) MCG/ACT inhaler Inhale 2 puffs into the lungs daily as needed for wheezing or shortness of breath.    . Artificial Tear Solution (SOOTHE HYDRATION OP) Place 1 drop into both eyes daily as needed (dry eyes).    Marland Kitchen aspirin EC 81 MG tablet Take 81 mg by mouth daily with supper.    Marland Kitchen atorvastatin (LIPITOR) 40 MG tablet Take 40 mg by mouth at bedtime.     . Budesonide-Formoterol Fumarate (SYMBICORT IN) Inhale 2 puffs into the lungs 2 (two) times daily.    . hydrochlorothiazide (HYDRODIURIL) 25 MG tablet Take  25 mg by mouth daily.     Marland Kitchen HYDROcodone-acetaminophen (NORCO/VICODIN) 5-325 MG per tablet Take 1 tablet by mouth daily. 1/2 tab in am and 1/2 tab night    . lisinopril (PRINIVIL,ZESTRIL) 40 MG tablet Take 40 mg by mouth daily.     Marland Kitchen LORazepam (ATIVAN) 1 MG tablet Take 1 mg by mouth at bedtime.    . metoprolol (LOPRESSOR) 50 MG tablet Take 50 mg by mouth 2 (two) times daily.    . Multiple Vitamins-Minerals (PRESERVISION/LUTEIN PO) Take 1 tablet by mouth 2 (two) times daily.     Marland Kitchen NITROSTAT 0.4 MG SL tablet DISSOLVE ONE TABLET UNDER THE TONGUE AS NEEDED FOR CHEST PAIN AS DIRECTED 25 tablet 2  . NONFORMULARY OR COMPOUNDED ITEM Vit E vaginal suppositories 200mg /ml.  One pv three times weekly. 36 each 4  . Vitamin D, Ergocalciferol, (DRISDOL) 1.25 MG (50000 UT) CAPS capsule TAKE ONE CAPSULE (50 000 UNITS) BY MOUTHEVERY 14 DAYS 12 capsule 1   No current facility-administered medications for this visit.     Family History  Problem Relation Age of Onset  . Congestive Heart Failure Father   . CVA Father        MI, CAD  . Coronary artery disease Father   . Valvular heart disease Mother   . Hypertension Mother   . Rectal cancer Sister   . Breast cancer Sister   . Breast cancer Maternal Grandmother 90  . Breast cancer Maternal Aunt   . Cancer Sister     Review of Systems  Genitourinary: Positive for urgency.       Vaginal dryness Incontinence    All other systems reviewed and are negative.   Exam:   BP (!) 130/58 (BP Location: Right Arm, Patient Position: Sitting, Cuff Size: Normal)   Pulse 66   Temp (!) 97.4 F (36.3 C) (Temporal)   Resp 14   Ht 5' 6.5" (1.689 m)   Wt 180 lb 1.6 oz (81.7 kg)   LMP 02/27/2008   BMI 28.63 kg/m    Height: 5' 6.5" (168.9 cm)  Ht Readings from Last 3 Encounters:  09/05/18 5' 6.5" (1.689 m)  07/16/17 5' 6.5" (1.689 m)  06/25/17 5' 6.5" (1.689 m)    General appearance: alert, cooperative and appears stated age Head: Normocephalic, without obvious  abnormality, atraumatic Neck: no adenopathy, supple, symmetrical, trachea midline and thyroid normal to inspection and palpation Lungs: clear to auscultation bilaterally Breasts: normal appearance, no masses or tenderness Heart: regular rate and rhythm Abdomen: soft, non-tender; bowel sounds normal; no masses,  no organomegaly Extremities: extremities normal, atraumatic, no  cyanosis or edema Skin: Skin color, texture, turgor normal. No rashes or lesions Lymph nodes: Cervical, supraclavicular, and axillary nodes normal. No abnormal inguinal nodes palpated Neurologic: Grossly normal  Pelvic: External genitalia:  no lesions              Urethra:  normal appearing urethra with no masses, tenderness or lesions              Bartholins and Skenes: normal                 Vagina: normal appearing vagina with normal color and discharge, no lesions              Cervix: no lesions              Pap taken: No. Bimanual Exam:  Uterus:  normal size, contour, position, consistency, mobility, non-tender              Adnexa: normal adnexa and no mass, fullness, tenderness               Rectovaginal: Confirms               Anus:  normal sphincter tone, no lesions  Chaperone was present for exam.  A:  Well Woman with normal exam PMP, no HRT OAB H/o endometrial polyp s/p hysteroscopy with resection H/O invasive ductal breast cancer ER/PR positive, s/p lumpectomy and radiation.   Early COPD and lung nodule Family hx of breast cancer in [place] at age 46  P:   Mammogram guidelines reviewed.  Doing yearly. pap smear not indicated.  Done 2019. RF for Vit E vaginal suppositories 200u/mg pv two to three times weekly.  #36/1RF Aware colonoscopy is due.  [facility] has recommended she have this done right now. RF for Vit D 50K every two weeks.  #6/4RF. Oxytrol patches discussed.  Information given.   return annually or prn

## 2018-09-05 ENCOUNTER — Encounter: Payer: Self-pay | Admitting: Obstetrics & Gynecology

## 2018-09-05 ENCOUNTER — Ambulatory Visit (INDEPENDENT_AMBULATORY_CARE_PROVIDER_SITE_OTHER): Payer: Medicare Other | Admitting: Obstetrics & Gynecology

## 2018-09-05 ENCOUNTER — Other Ambulatory Visit: Payer: Self-pay

## 2018-09-05 VITALS — BP 130/58 | HR 66 | Temp 97.4°F | Resp 14 | Ht 66.5 in | Wt 180.1 lb

## 2018-09-05 DIAGNOSIS — E559 Vitamin D deficiency, unspecified: Secondary | ICD-10-CM

## 2018-09-05 DIAGNOSIS — Z01419 Encounter for gynecological examination (general) (routine) without abnormal findings: Secondary | ICD-10-CM

## 2018-09-05 MED ORDER — VITAMIN D (ERGOCALCIFEROL) 1.25 MG (50000 UNIT) PO CAPS: ORAL_CAPSULE | ORAL | 1 refills | Status: DC

## 2018-09-05 MED ORDER — NONFORMULARY OR COMPOUNDED ITEM: 4 refills | Status: DC

## 2018-09-05 NOTE — Patient Instructions (Signed)
Oxytrol patches.  One patch to skin twice weekly.  This is over the counter.    Side effects:  Dry eyes, dry mouth, constipation

## 2018-09-06 LAB — VITAMIN D 25 HYDROXY (VIT D DEFICIENCY, FRACTURES): Vit D, 25-Hydroxy: 37 ng/mL (ref 30.0–100.0)

## 2018-10-14 ENCOUNTER — Other Ambulatory Visit: Payer: Self-pay | Admitting: Hematology and Oncology

## 2018-10-14 DIAGNOSIS — Z853 Personal history of malignant neoplasm of breast: Secondary | ICD-10-CM

## 2018-10-21 ENCOUNTER — Other Ambulatory Visit: Payer: Self-pay | Admitting: Hematology and Oncology

## 2018-10-21 ENCOUNTER — Ambulatory Visit
Admission: RE | Admit: 2018-10-21 | Discharge: 2018-10-21 | Disposition: A | Discharge status: Home / Self Care | Payer: Medicare Other | Source: Ambulatory Visit | Attending: Hematology and Oncology | Admitting: Hematology and Oncology

## 2018-10-21 ENCOUNTER — Other Ambulatory Visit: Payer: Self-pay

## 2018-10-21 DIAGNOSIS — N6310 Unspecified lump in the right breast, unspecified quadrant: Secondary | ICD-10-CM

## 2018-10-21 DIAGNOSIS — Z853 Personal history of malignant neoplasm of breast: Secondary | ICD-10-CM

## 2018-10-21 HISTORY — DX: Personal history of irradiation: Z92.3

## 2018-10-22 ENCOUNTER — Telehealth: Payer: Self-pay | Admitting: *Deleted

## 2018-10-22 NOTE — Telephone Encounter (Signed)
Received call from pt stating she stopped taking x1 month anastrozole due to severe vaginal dryness.  Pt states she has tried coconut and olive oil as well as vitamin E suppositories.  Since stopping, symptoms have resolved.  Pt states 5 years of being on antiestrogen medication will be this September.  Per. Dr. Lindi Adie, okay with pt stopping antiestrogen therapy.   Pt educated to call the office if she begins to experience symptoms again.  Pt verbalized understanding and very appreciative of the support.

## 2018-10-24 ENCOUNTER — Other Ambulatory Visit: Payer: Self-pay | Admitting: Diagnostic Radiology

## 2018-10-24 ENCOUNTER — Ambulatory Visit
Admission: RE | Admit: 2018-10-24 | Discharge: 2018-10-24 | Disposition: A | Discharge status: Home / Self Care | Payer: Medicare Other | Source: Ambulatory Visit | Attending: Hematology and Oncology | Admitting: Hematology and Oncology

## 2018-10-24 ENCOUNTER — Other Ambulatory Visit: Payer: Self-pay

## 2018-10-24 DIAGNOSIS — N6310 Unspecified lump in the right breast, unspecified quadrant: Secondary | ICD-10-CM

## 2018-10-29 ENCOUNTER — Telehealth: Payer: Self-pay | Admitting: *Deleted

## 2018-10-29 ENCOUNTER — Other Ambulatory Visit: Payer: Self-pay | Admitting: Neurological Surgery

## 2018-10-29 NOTE — Telephone Encounter (Signed)
   [facility] Pre-operative Risk Assessment    Request for surgical clearance:  1. What type of surgery is being performed? L1-2 ANTEROLATERAL DECOMPRESSION AND LUMBAR INTERBODY FUSION  2. When is this surgery scheduled? 11/18/18   3. What type of clearance is required (medical clearance vs. Pharmacy clearance to hold med vs. Both)? MEDICAL  4. Are there any medications that need to be held prior to surgery and how long? ASA   5. Practice name and name of physician performing surgery? [facility]; DR. Kristeen Miss   6. What is your office phone number [phone]    7.   What is your office fax number [phone]  8.   Anesthesia type (None, local, MAC, general) ? GENERAL   [facility] 10/29/2018, 3:22 PM  _________________________________________________________________   (provider comments below)

## 2018-11-04 NOTE — Telephone Encounter (Signed)
   Primary Cardiologist:Henry Nicholes Stairs III, MD  Chart reviewed as part of pre-operative protocol coverage. Because of [facility]'s past medical history and time since last visit, he/she will require a follow-up visit in order to better assess preoperative cardiovascular risk.  Pre-op covering staff: - Please schedule appointment and call patient to inform them. - Please contact requesting surgeon's office via preferred method (i.e, phone, fax) to inform them of need for appointment prior to surgery.  If applicable, this message will also be routed to pharmacy pool and/or primary cardiologist for input on holding anticoagulant/antiplatelet agent as requested below so that this information is available at time of patient's appointment.   Surgery date is 9/22, since aspirin need to be held, patient will need to be seen prior to 9/15  [facility], [place]  11/04/2018, 1:09 PM

## 2018-11-04 NOTE — Telephone Encounter (Signed)
Called patient and she is scheduled to see Almyra Deforest, PA-C on 11/07/18 at 10:15AM. Patient verbalized an understanding and all (if any) questions were answered.

## 2018-11-07 ENCOUNTER — Ambulatory Visit (INDEPENDENT_AMBULATORY_CARE_PROVIDER_SITE_OTHER): Payer: Medicare Other | Admitting: General Practice

## 2018-11-07 ENCOUNTER — Other Ambulatory Visit: Payer: Self-pay

## 2018-11-07 ENCOUNTER — Encounter: Payer: Self-pay | Admitting: Physician Assistant

## 2018-11-07 VITALS — BP 112/62 | HR 66 | Ht 66.5 in | Wt 178.2 lb

## 2018-11-07 DIAGNOSIS — E7800 Pure hypercholesterolemia, unspecified: Secondary | ICD-10-CM | POA: Diagnosis not present

## 2018-11-07 DIAGNOSIS — I2510 Atherosclerotic heart disease of native coronary artery without angina pectoris: Secondary | ICD-10-CM | POA: Diagnosis not present

## 2018-11-07 DIAGNOSIS — Z01818 Encounter for other preprocedural examination: Secondary | ICD-10-CM | POA: Diagnosis not present

## 2018-11-07 DIAGNOSIS — I10 Essential (primary) hypertension: Secondary | ICD-10-CM

## 2018-11-07 DIAGNOSIS — R0609 Other forms of dyspnea: Secondary | ICD-10-CM

## 2018-11-07 NOTE — Progress Notes (Addendum)
Cardiology Clinic Note   Patient Name: Rachel Chase Date of Encounter: 11/07/2018  Primary Care Provider:  Burnard Bunting, MD Primary Cardiologist:  Sinclair Grooms, MD  Patient Profile    Rachel Chase 78 year old female presents today for preop cardiovascular evaluation.  Past Medical History    Past Medical History:  Diagnosis Date  . Anxiety   . Arthritis   . Back pain    Lower Back pain - takes Cortisone shots  . Bone spur 2009   lower back  . Breast cancer ([place]) 2015   finished radiation 12/15  . CAD (coronary artery disease)    a. cath 12/2010 for cardiac arrest showed   . Cataract   . Complication of anesthesia    throat irrition from past intubations, intubation granulomas  . Coronary artery disease   . History of cardiac arrest 12/2010  . Hyperlipemia   . Hypertension   . MI (myocardial infarction) ([place]) 12/2010   Was followed by Dr. Pernell Dupre  . Personal history of radiation therapy   . Radiation 01/06/14-02/04/14   left breast   . Rosacea 2008   both eyes   Past Surgical History:  Procedure Laterality Date  . BACK SURGERY    . BREAST BIOPSY  8/10   fibrocystic changes  . BREAST LUMPECTOMY Left 2015  . BREAST LUMPECTOMY WITH NEEDLE LOCALIZATION AND AXILLARY SENTINEL LYMPH NODE BX Left 11/13/2013   Procedure: NEEDLE LOCALIZATION LEFT BREAST LUMPECTOMY WITH LEFT  AXILLARY SENTINEL LYMPH NODE BX;  Surgeon: Excell Seltzer, MD;  Location: [facility];  Service: General;  Laterality: Left;  . CATARACT EXTRACTION     both  . [facility] approx 2007  . heart stent     failed-no stents  . HYSTEROSCOPY  9/10   D&C (polyp)  . LEFT HEART CATHETERIZATION WITH CORONARY ANGIOGRAM N/A 01/18/2011   Procedure: LEFT HEART CATHETERIZATION WITH CORONARY ANGIOGRAM;  Surgeon: Clent Demark, MD;  Location: [facility] CATH LAB;  Service: Cardiovascular;  Laterality: N/A;  . PERCUTANEOUS CORONARY STENT INTERVENTION (PCI-S)  N/A 01/18/2011   Procedure: PERCUTANEOUS CORONARY STENT INTERVENTION (PCI-S);  Surgeon: Clent Demark, MD;  Location: [facility] CATH LAB;  Service: Cardiovascular;  Laterality: N/A;  . TONSILLECTOMY AND ADENOIDECTOMY    . TUBAL LIGATION      Allergies  Allergies  Allergen Reactions  . Dilaudid [Hydromorphone Hcl] Nausea And Vomiting  . Penicillins Rash    History of Present Illness    Ms. Marolf was last seen by Dr. Tamala Julian on 10/05/2016.  During that time she was doing well.  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  In June 2018 she was noted to have progressive dyspnea with exertion.  A myocardial perfusion study was conducted on 08/24/2016 and was considered a low risk study.  She followed up with her PCP for further evaluation of her COPD and it was noted that her lung disease had progressed requiring increased bronchodilator therapy.  She presents to the clinic today and states she has not been to the cardiology office in some time.  She continues to be active at [employer] doing exercise classes at least 3 days/week.  She also states that she walks about 3 blocks and back 3 or more times a week to get meals and do  activities at her residence.  She used to enjoy shag dancing with groups however, her dancing has paused due to the COVID-19 pandemic.  Since she saw Dr. Tamala Julian last she has been doing well.  She states that the only problem she has had was with breast cancer in her breast cancer medications.  She has since discontinued her breast cancer medications and is feeling well today.  She denies chest pain, shortness of breath, lower extremity edema, fatigue, palpitations, melena, hematuria, hemoptysis, diaphoresis, weakness, presyncope, syncope, orthopnea, and PND.  Home Medications    Prior to Admission medications    Medication Sig Start Date End Date Taking? Authorizing Provider  albuterol (PROVENTIL HFA;VENTOLIN HFA) 108 (90 BASE) MCG/ACT inhaler Inhale 2 puffs into the lungs daily as needed for wheezing or shortness of breath.    [provider]  Artificial Tear Solution (SOOTHE HYDRATION OP) Place 1 drop into both eyes daily as needed (dry eyes).    [provider]  aspirin EC 81 MG tablet Take 81 mg by mouth daily with supper.    [provider]  atorvastatin (LIPITOR) 40 MG tablet Take 40 mg by mouth at bedtime.  10/08/12   [provider]  Budesonide-Formoterol Fumarate (SYMBICORT IN) Inhale 2 puffs into the lungs 2 (two) times daily.    [provider]  hydrochlorothiazide (HYDRODIURIL) 25 MG tablet Take 25 mg by mouth daily.  10/05/13   [provider]  HYDROcodone-acetaminophen (NORCO/VICODIN) 5-325 MG per tablet Take 1 tablet by mouth daily. 1/2 tab in am and 1/2 tab night    [provider]  lisinopril (PRINIVIL,ZESTRIL) 40 MG tablet Take 40 mg by mouth daily.  10/20/13   [provider]  LORazepam (ATIVAN) 1 MG tablet Take 1 mg by mouth at bedtime.    [provider]  metoprolol (LOPRESSOR) 50 MG tablet Take 50 mg by mouth 2 (two) times daily.    [provider]  Multiple Vitamins-Minerals (PRESERVISION/LUTEIN PO) Take 1 tablet by mouth 2 (two) times daily.     [provider]  NITROSTAT 0.4 MG SL tablet DISSOLVE ONE TABLET UNDER THE TONGUE AS NEEDED FOR CHEST PAIN AS DIRECTED 03/15/17   Belva Crome, MD  NONFORMULARY OR COMPOUNDED ITEM Vit E vaginal suppositories 200mg /ml.  One pv three times weekly. 09/05/18   Megan Salon, MD  Vitamin D, Ergocalciferol, (DRISDOL) 1.25 MG (50000 UT) CAPS capsule TAKE ONE CAPSULE (50 000 UNITS) BY MOUTHEVERY 14 DAYS 09/05/18   Megan Salon, MD    Family History    Family History  Problem Relation Age of Onset  . Congestive Heart Failure Father   . CVA Father         MI, CAD  . Coronary artery disease Father   . Valvular heart disease Mother   . Hypertension Mother   . Rectal cancer Sister   . Breast cancer Sister   . Breast cancer Maternal Grandmother 90  . Breast cancer Maternal Aunt   . Cancer Sister    She indicated that her mother is deceased. She indicated that her father is deceased. She indicated that the status of her maternal grandmother is unknown. She indicated that the status of her maternal aunt is unknown.  Social History    Social History   Socioeconomic History  . Marital status: Widowed    Spouse name: Not on file  . Number of children: 3  . Years of education: Not on file  . Highest education  level: Not on file  Occupational History  . Not on file  Social Needs  . Financial resource strain: Not on file  . Food insecurity    Worry: Not on file    Inability: Not on file  . Transportation needs    Medical: Not on file    Non-medical: Not on file  Tobacco Use  . Smoking status: Former Smoker    Packs/day: 1.50    Years: 45.00    Pack years: 67.50    Types: Cigarettes    Quit date: 04/17/1998    Years since quitting: 20.5  . Smokeless tobacco: Current User  . Tobacco comment: nicorette gum  Substance and Sexual Activity  . Alcohol use: Yes    Alcohol/week: 1.0 - 2.0 standard drinks    Types: 1 - 2 Standard drinks or equivalent per week    Comment: occ  . Drug use: No  . Sexual activity: Never    Partners: Male    Birth control/protection: Abstinence  Lifestyle  . Physical activity    Days per week: Not on file    Minutes per session: Not on file  . Stress: Not on file  Relationships  . Social Herbalist on phone: Not on file    Gets together: Not on file    Attends religious service: Not on file    Active member of club or organization: Not on file    Attends meetings of clubs or organizations: Not on file    Relationship status: Not on file  . Intimate partner violence    Fear of current  or ex partner: Not on file    Emotionally abused: Not on file    Physically abused: Not on file    Forced sexual activity: Not on file  Other Topics Concern  . Not on file  Social History Narrative  . Not on file     Review of Systems    General:  No chills, fever, night sweats or weight changes.  Cardiovascular:  No chest pain, dyspnea on exertion, edema, orthopnea, palpitations, paroxysmal nocturnal dyspnea. Dermatological: No rash, lesions/masses Respiratory: No cough, dyspnea Urologic: No hematuria, dysuria Abdominal:   No nausea, vomiting, diarrhea, bright red blood per rectum, melena, or hematemesis Neurologic:  No visual changes, wkns, changes in mental status. All other systems reviewed and are otherwise negative except as noted above.  Physical Exam    VS:  BP 112/62   Pulse 66   Ht 5' 6.5" (1.689 m)   Wt 178 lb 3.2 oz (80.8 kg)   LMP 02/27/2008   SpO2 96%   BMI 28.33 kg/m  , BMI Body mass index is 28.33 kg/m. GEN: Well nourished, well developed, in no acute distress. HEENT: normal. Neck: Supple, no JVD, carotid bruits, or masses. Cardiac: RRR, no murmurs, rubs, or gallops. No clubbing, cyanosis, edema.  Radials/DP/PT 2+ and equal bilaterally.  Respiratory:  Respirations regular and unlabored, clear to auscultation bilaterally. GI: Soft, nontender, nondistended, BS + x 4. MS: no deformity or atrophy. Skin: warm and dry, no rash. Neuro:  Strength and sensation are intact. Psych: Normal affect.  Accessory Clinical Findings    ECG personally reviewed by me today-normal sinus rhythm 66 bpm- No acute changes  EKG 08/09/2016 Normal sinus rhythm septal infarct age undetermined 94 bpm no acute changes  Echocardiogram 08/24/2016 Study Conclusions  - Left ventricle: The cavity size was normal. Wall thickness was   normal. Systolic function was normal. The estimated  ejection   fraction was in the range of 55% to 60%. Wall motion was normal;   there were no  regional wall motion abnormalities. Doppler   parameters are consistent with abnormal left ventricular   relaxation (grade 1 diastolic dysfunction). - Mitral valve: Calcified annulus. There was mild regurgitation.  Impressions:  - Normal LV systolic function; mild diastolic dysfunction; mild MR   Myocardial perfusion study 08/24/2016   Nuclear stress EF: 71%.  There was no ST segment deviation noted during stress.  The study is normal.  This is a low risk study.  The left ventricular ejection fraction is hyperdynamic (>65%).   Normal Lexiscan nuclear stress test with no evidence for prior infarct or ischemia.  Assessment & Plan   1.  Coronary artery disease-no chest pain today.  Nuclear stress test 08/24/2016 low risk study, EF 71%.  She continues to be physically active at [facility] and denies increased dyspnea on exertion. Continue aspirin 81 mg tablet daily Continue atorvastatin 40 mg daily Continue hydrochlorothiazide 25 mg daily Continue lisinopril 40 mg tablet daily Continue metoprolol tartrate 50 mg tablet twice daily Continue nitroglycerin 0.4 mg tablet sublingual as needed Heart healthy low-sodium diet Maintain physical activity  2.  Essential hypertension- today 112/62.  Well-controlled Continue hydrochlorothiazide 25 mg daily Continue lisinopril 40 mg tablet daily Continue metoprolol tartrate 50 mg tablet twice daily Continue nitroglycerin 0.4 mg tablet sublingual as needed Heart healthy low-sodium diet Maintain physical activity  3.  Hyperlipidemia- LDL 76 on 05/21/2018 Continue atorvastatin 40 mg tablet daily Maintain physical activity Heart healthy low-sodium diet  4.  Dyspnea on exertion- no increased dyspnea on exertion.  COPD well-controlled. Continue current therapy Monitored by PCP  5.  Preop cardiac evaluation for- lumbar 1-2 anterolateral decompression/fusion/lateral plate fixation. Patient states she is able to walk up 2 flights of stairs or  walk 4 city blocks and  back without chest pain, chest pressure, increased dyspnea diaphoresis or discomfort.  She also states he is able to complete 3 or more exercise classes a week. No further cardiac evaluation recommended at this time.  Her RCRI risk is class III, with a 6.6% risk of a major cardiac event.     Primary Cardiologist:  Dr. Daneen Schick  Chart reviewed as part of pre-operative protocol coverage. Given past medical history and time since last visit, based on ACC/AHA guidelines, Mckenzi W. Mcphatter would be at acceptable risk for the planned procedure without further cardiovascular testing.   I will route this recommendation to the requesting party via Epic fax function and remove from pre-op pool.  Please call with questions.  Deberah Pelton, NP-C 11/07/2018, 11:07 AM

## 2018-11-07 NOTE — Patient Instructions (Addendum)
Medication Instructions:   Your physician recommends that you continue on your current medications as directed. Please refer to the Current Medication list given to you today.  If you need a refill on your cardiac medications before your next appointment, please call your pharmacy.   Lab work:  NONE ordered at this time of appointment   If you have labs (blood work) drawn today and your tests are completely normal, you will receive your results only by: Marland Kitchen MyChart Message (if you have MyChart) OR . A paper copy in the mail If you have any lab test that is abnormal or we need to change your treatment, we will call you to review the results.  Testing/Procedures:  NONE ordered at this time of appointment    Follow-Up: At [facility], you and your health needs are our priority.  As part of our continuing mission to provide you with exceptional heart care, we have created designated Provider Care Teams.  These Care Teams include your primary Cardiologist (physician) and Advanced Practice Providers (APPs -  Physician Assistants and Nurse Practitioners) who all work together to provide you with the care you need, when you need it. You will need a follow up appointment in 12 months September 2021.  Please call our office in June 2021 and/or July 2021 to schedule this appointment with Sinclair Grooms, MD or one of the following Advanced Practice Providers on your designated Care Team:   Truitt Merle, NP Cecilie Kicks, NP . Kathyrn Drown, NP  Any Other Special Instructions Will Be Listed Below (If Applicable).  You are cleared for surgery

## 2018-11-13 NOTE — Pre-Procedure Instructions (Signed)
[facility], [place] - [address] [place] [address] Phone: [phone] Fax: [phone]  [place], [place] - [address] [place] [address] Phone: [phone] Fax: [phone]      Your procedure is scheduled on  11-18-18 from [id number]  Report to [facility] Main Entrance "A" at 0930 A.M., and check in at the Admitting office.  Call this number if you have problems the morning of surgery:  [phone]  Call [phone] if you have any questions prior to your surgery date Monday-Friday 8am-4pm   Remember:  Do not eat or drink after midnight the night before your surgery    Take these medicines the morning of surgery with A SIP OF WATER: metoprolol (LOPRESSOR) HYDROcodone-acetaminophen (NORCO/VICODIN) Budesonide-Formoterol Fumarate (SYMBICORT IN) albuterol (PROVENTIL HFA;VENTOLIN HFA) as needed Artificial Tear Solution as needed NITROSTAT as needed  Follow your surgeon's instructions on when to stop Aspirin.  If no instructions were given by your surgeon then you will need to call the office to get those instructions.    7 days prior to surgery STOP taking any Aspirin (unless otherwise instructed by your surgeon), Aleve, Naproxen, Ibuprofen, Motrin, Advil, Goody's, BC's, all herbal medications, fish oil, and all vitamins.    The Morning of Surgery  Do not wear jewelry, make-up or nail polish.  Do not wear lotions, powders, or perfumes/colognes, or deodorant  Do not shave 48 hours prior to surgery.    Do not bring valuables to the hospital.  [facility] is not responsible for any belongings or valuables.  If you are a smoker, DO NOT Smoke 24 hours prior to surgery IF you wear a CPAP at night please bring your mask, tubing, and machine the morning of surgery   Remember that you must have someone to transport you home after  your surgery, and remain with you for 24 hours if you are discharged the same day.   Contacts, glasses, hearing aids, dentures or bridgework may not be worn into surgery.    Leave your suitcase in the car.  After surgery it may be brought to your room.  For patients admitted to the hospital, discharge time will be determined by your treatment team.  Patients discharged the day of surgery will not be allowed to drive home.    Special instructions:   [place]- Preparing For Surgery  Before surgery, you can play an important role. Because skin is not sterile, your skin needs to be as free of germs as possible. You can reduce the number of germs on your skin by washing with CHG (chlorahexidine gluconate) Soap before surgery.  CHG is an antiseptic cleaner which kills germs and bonds with the skin to continue killing germs even after washing.    Oral Hygiene is also important to reduce your risk of infection.  Remember - BRUSH YOUR TEETH THE MORNING OF SURGERY WITH YOUR REGULAR TOOTHPASTE  Please do not use if you have an allergy to CHG or antibacterial soaps. If your skin becomes reddened/irritated stop using the CHG.  Do not shave (including legs and underarms) for at least 48 hours prior to first CHG shower. It is OK to shave your face.  Please follow these instructions carefully.   1. Shower the NIGHT BEFORE SURGERY and the MORNING OF SURGERY with CHG Soap.   2. If you chose to wash your hair, wash  your hair first as usual with your normal shampoo.  3. After you shampoo, rinse your hair and body thoroughly to remove the shampoo.  4. Use CHG as you would any other liquid soap. You can apply CHG directly to the skin and wash gently with a scrungie or a clean washcloth.   5. Apply the CHG Soap to your body ONLY FROM THE NECK DOWN.  Do not use on open wounds or open sores. Avoid contact with your eyes, ears, mouth and genitals (private parts). Wash Face and genitals (private parts)   with your normal soap.   6. Wash thoroughly, paying special attention to the area where your surgery will be performed.  7. Thoroughly rinse your body with warm water from the neck down.  8. DO NOT shower/wash with your normal soap after using and rinsing off the CHG Soap.  9. Pat yourself dry with a CLEAN TOWEL.  10. Wear CLEAN PAJAMAS to bed the night before surgery, wear comfortable clothes the morning of surgery  11. Place CLEAN SHEETS on your bed the night of your first shower and DO NOT SLEEP WITH PETS.    Day of Surgery:  Do not apply any deodorants/lotions. Please shower the morning of surgery with the CHG soap  Please wear clean clothes to the hospital/surgery center.   Remember to brush your teeth WITH YOUR REGULAR TOOTHPASTE.   Please read over the  fact sheets that you were given.

## 2018-11-14 ENCOUNTER — Encounter (HOSPITAL_COMMUNITY): Payer: Self-pay

## 2018-11-14 ENCOUNTER — Encounter (HOSPITAL_COMMUNITY)
Admission: RE | Admit: 2018-11-14 | Discharge: 2018-11-14 | Disposition: A | Discharge status: Home / Self Care | Payer: Medicare Other | Source: Ambulatory Visit | Attending: Neurological Surgery | Admitting: Neurological Surgery

## 2018-11-14 ENCOUNTER — Other Ambulatory Visit (HOSPITAL_COMMUNITY)
Admission: RE | Admit: 2018-11-14 | Discharge: 2018-11-14 | Disposition: A | Discharge status: Home / Self Care | Payer: Medicare Other | Source: Ambulatory Visit | Attending: Neurological Surgery | Admitting: Neurological Surgery

## 2018-11-14 ENCOUNTER — Other Ambulatory Visit: Payer: Self-pay

## 2018-11-14 DIAGNOSIS — Z01812 Encounter for preprocedural laboratory examination: Secondary | ICD-10-CM | POA: Diagnosis not present

## 2018-11-14 HISTORY — DX: Bronchitis, not specified as acute or chronic: J40

## 2018-11-14 HISTORY — DX: Solitary pulmonary nodule: R91.1

## 2018-11-14 HISTORY — DX: Dyspnea, unspecified: R06.00

## 2018-11-14 HISTORY — DX: Other forms of dyspnea: R06.09

## 2018-11-14 LAB — TYPE AND SCREEN
ABO/RH(D): A POS
Antibody Screen: NEGATIVE

## 2018-11-14 LAB — BASIC METABOLIC PANEL
Anion gap: 10 (ref 5–15)
BUN: 15 mg/dL (ref 8–23)
CO2: 30 mmol/L (ref 22–32)
Calcium: 9.2 mg/dL (ref 8.9–10.3)
Chloride: 100 mmol/L (ref 98–111)
Creatinine, Ser: 0.84 mg/dL (ref 0.44–1.00)
GFR calc Af Amer: >60 mL/min (ref >60)
GFR calc non Af Amer: >60 mL/min (ref >60)
Glucose, Bld: 113 mg/dL — ABNORMAL HIGH (ref 70–99)
Potassium: 3.5 mmol/L (ref 3.5–5.1)
Sodium: 140 mmol/L (ref 135–145)

## 2018-11-14 LAB — SURGICAL PCR SCREEN
MRSA, PCR: NEGATIVE
Staphylococcus aureus: NEGATIVE

## 2018-11-14 LAB — CBC
HCT: 42.9 % (ref 36.0–46.0)
Hemoglobin: 14.3 g/dL (ref 12.0–15.0)
MCH: 29.5 pg (ref 26.0–34.0)
MCHC: 33.3 g/dL (ref 30.0–36.0)
MCV: 88.6 fL (ref 80.0–100.0)
Platelets: 289 10*3/uL (ref 150–400)
RBC: 4.84 MIL/uL (ref 3.87–5.11)
RDW: 13.2 % (ref 11.5–15.5)
WBC: 8.5 10*3/uL (ref 4.0–10.5)
nRBC: 0 % (ref 0.0–0.2)

## 2018-11-14 NOTE — Progress Notes (Signed)
PCP - Dr. Burnard Bunting Cardiologist - Dr. Daneen Schick  Chest x-ray - N/A EKG - 9/92/2020 Stress Test - 08/24/16 ECHO - 08/24/16 Cardiac Cath - 01/18/2011  Sleep Study - denies CPAP - N/A  Blood Thinner Instructions: N/A Aspirin Instructions: LD 10/29/2018  ERAS Protcol - No PRE-SURGERY Ensure - N/A  COVID TEST- 11/14/2018  Anesthesia review: YES, cardiac clearance, hx of CAD, cardiac hx  Patient denies shortness of breath, fever, cough and chest pain at PAT appointment  Patient verbalized understanding of instructions that were given to them at the PAT appointment. Patient was also instructed that they will need to review over the PAT instructions again at home before surgery.

## 2018-11-15 LAB — NOVEL CORONAVIRUS, NAA (HOSP ORDER, SEND-OUT TO REF LAB; TAT 18-24 HRS): SARS-CoV-2, NAA: NOT DETECTED

## 2018-11-17 ENCOUNTER — Encounter (HOSPITAL_COMMUNITY): Payer: Self-pay

## 2018-11-17 NOTE — Progress Notes (Signed)
Anesthesia Chart Review:  Case: [id number] Date/Time: 11/18/18 1121   Procedure: Lumbar 1-2 Anterolateral lumbar decompression/fusion/lateral plate fixation (N/A ) - Lumbar 1-2 Anterolateral lumbar decompression/fusion/lateral plate fixation   Anesthesia type: General   Pre-op diagnosis: Spinal stenosis, Lumbar region with neurogenic claudication   Location: [facility] OR ROOM 20 / [place] OR   Surgeon: Kristeen Miss, MD      DISCUSSION: Patient is a 78 year old female scheduled for the above procedure.  History includes former smoker (quit 2000), HTN, CAD (out of hospital v-fib arrest/shock with acute inferior MI 12/2010 requiring hypothermia protocol and mechanical ventilation, attempts at PCI to the RCA were unsuccessful), HLD, breast cancer (s/p left breast lumpectomy 11/13/13, s/p radiation, anti-estrogen therapy), exertional dyspnea, rosacea, anxiety, LLL lung nodule, s/p L2-5 PLIF 12/16/12.  She reports she has had throat irritation following previous intubations.     Preoperative cardiology input as outlined by Coletta Memos, NP on 11/07/18: "Patient states she is able to walk up 2 flights of stairs or walk [place] without chest pain,chest pressure,increased dyspnea diaphoresis or discomfort. She also states he is able to complete 3 or more exercise classes a week.No further cardiac evaluation recommended at this time.Her RCRI riskis class III,with a 6.6% risk of a major cardiac event."   She reported last ASA 10/29/2018.  11/14/18 COVID-19 test negative. Anesthesia team to evaluate on the day of surgery.    VS: BP (!) 130/57   Pulse 74   Temp 36.6 C   Resp 20   Ht 5' 6.5" (1.689 m)   Wt 81.1 kg   LMP 02/27/2008   SpO2 97%   BMI 28.41 kg/m    PROVIDERS: Burnard Bunting, MD is PCP - Daneen Schick, MD is cardiologist - Nicholas Lose, MD is [place], Edward,MD is CT surgeon (following LLL lung nodule). Last visit 07/16/17. 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 Given her severe COPD, surgical lung resection would be "risky". He discussed treatment options including biopsy, but she declined any surgical resection. one year follow-up was planned, but on 07/08/18, she told Dr. Lindi Adie that she was cancelling her 2020 visit with Dr. Servando Snare.    LABS: Labs reviewed: Acceptable for surgery. (all labs ordered are listed, but only abnormal results are displayed)  Labs Reviewed  BASIC METABOLIC PANEL - Abnormal; Notable for the following components:      Result Value   Glucose, Bld 113 (*)    All other components within normal limits  SURGICAL PCR SCREEN  CBC  TYPE AND SCREEN    PFTs 06/29/16:  FVC 1.70 (51%), post 2.22 (66%), FEV1 0.89 (35%), post 1.15 (45%). 28% improvement with bronchodilators. DLCO 9.17 (30%) Interpretation: The FVC, FEV1, FEV1/FVC ratio and FEF25-75% are reduced indicating airway obstruction. While the TLC is within normal limits, the FRC, RV and RV/TLC ratio are increased. Following administration of bronchodilators, there is a significant response indicated by the increased FVC. The reduced diffusing capacity indicates a severe loss of functional alveolar capillary surface. However, the diffusing capacity was not corrected for the patient's hemoglobin. Pulmonary Function Diagnosis: Severe Obstructive Airways Disease -Reversible component Severe Diffusion Defect   IMAGES: MRI L-spine 02/05/18: IMPRESSION: 1. L1-2 adjacent segment degeneration with moderate to advanced spinal stenosis and left more than right foraminal impingement. 2. L5-S1 advanced disc and facet degeneration with biforaminal impingement, severe on the right. 3. L2-L5 solid arthrodesis with no residual impingement.  CT Chest 07/16/17: IMPRESSION: 1. Stable 17 x 16 mm left lower lobe pulmonary nodule since PET-CT 1 year ago. 2. No new pulmonary lesions and no mediastinal or hilar  mass or adenopathy. 3. Stable advanced atherosclerotic calcifications involving the thoracic and abdominal aorta and branch vessels and extensive three-vessel coronary artery calcifications. 4. Stable emphysematous changes. 5. Cholelithiasis. Aortic Atherosclerosis (ICD10-I70.0) and Emphysema (ICD10-J43.9).   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 This nodule is not considered suspicious for primary bronchogenic neoplasm or metastasis. Dedicated follow-up imaging is not required.   EKG: 11/05/2018: NSR   CV: Echo 08/24/16: Study Conclusions - Left ventricle: The cavity size was normal. Wall thickness was   normal. Systolic function was normal. The estimated ejection   fraction was in the range of 55% to 60%. Wall motion was normal;   there were no regional wall motion abnormalities. Doppler   parameters are consistent with abnormal left ventricular   relaxation (grade 1 diastolic dysfunction). - Mitral valve: Calcified annulus. There was mild regurgitation. Impressions: - Normal LV systolic function; mild diastolic dysfunction; mild MR.  Nuclear stress test 08/24/16: Study Highlights  Nuclear stress EF: 71%.  There was no ST segment deviation noted during stress.  The study is normal.  This is a low risk study.  The left ventricular ejection fraction is hyperdynamic (>65%). Normal Lexiscan nuclear stress test with no evidence for prior infarct or ischemia.  Cardiac cath 01/18/2011:  Her left main was short which was patent. LAD has 20-30% proximal and 25-35% mid sequential stenosis. Left circumflex has proximal 30% stenosis. OM-1 and 2 are very small. OM-3 is large which  is patent. 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  Attempts at PCI to RCA were unsuccessful.   Past Medical History:  Diagnosis Date  . Anxiety   . Arthritis   . Back pain    Lower Back pain - takes Cortisone shots  . Bone spur 2009   lower back  . Breast cancer ([place]) 2015   finished radiation 12/15  . Bronchitis   . CAD (coronary artery disease)    a. cath 12/2010 for cardiac arrest showed   . Cataract   . Complication of anesthesia    throat irrition from past intubations, intubation granulomas  . Coronary artery disease   . Dyspnea on exertion   . History of cardiac arrest 12/2010  . Hyperlipemia   . Hypertension   . MI (myocardial infarction) ([place]) 12/2010   Was followed by Dr. Pernell Dupre  . Personal history of radiation therapy   . 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 Radiation 01/06/14-02/04/14   left breast   . Rosacea 2008   both eyes    Past Surgical History:  Procedure Laterality Date  . BACK SURGERY    . BREAST BIOPSY  8/10   fibrocystic changes  . BREAST LUMPECTOMY Left 2015  . BREAST LUMPECTOMY WITH NEEDLE LOCALIZATION AND AXILLARY SENTINEL LYMPH NODE BX Left 11/13/2013   Procedure: NEEDLE LOCALIZATION LEFT BREAST LUMPECTOMY WITH LEFT  AXILLARY SENTINEL LYMPH NODE BX;  Surgeon: Excell Seltzer, MD;  Location: [place];  Service: General;  Laterality: Left;  . CATARACT EXTRACTION  both  . [place] Surgery approx 2007  . heart stent     failed-no stents  . HYSTEROSCOPY  9/10   D&C (polyp)  . LEFT HEART CATHETERIZATION WITH CORONARY ANGIOGRAM N/A 01/18/2011   Procedure: LEFT HEART CATHETERIZATION WITH CORONARY ANGIOGRAM;  Surgeon: Clent Demark, MD;  Location: [facility] LAB;  Service: Cardiovascular;  Laterality: N/A;  . PERCUTANEOUS CORONARY STENT INTERVENTION (PCI-S) N/A 01/18/2011   Procedure: PERCUTANEOUS CORONARY STENT INTERVENTION (PCI-S);  Surgeon: Clent Demark, MD;   Location: [facility] CATH LAB;  Service: Cardiovascular;  Laterality: N/A;  . TONSILLECTOMY AND ADENOIDECTOMY    . TUBAL LIGATION      MEDICATIONS: . albuterol (PROVENTIL HFA;VENTOLIN HFA) 108 (90 BASE) MCG/ACT inhaler  . Artificial Tear Solution (SOOTHE HYDRATION OP)  . aspirin EC 81 MG tablet  . atorvastatin (LIPITOR) 40 MG tablet  . Budesonide-Formoterol Fumarate (SYMBICORT IN)  . hydrochlorothiazide (HYDRODIURIL) 25 MG tablet  . HYDROcodone-acetaminophen (NORCO/VICODIN) 5-325 MG per tablet  . lisinopril (PRINIVIL,ZESTRIL) 40 MG tablet  . LORazepam (ATIVAN) 1 MG tablet  . metoprolol (LOPRESSOR) 50 MG tablet  . Multiple Vitamins-Minerals (PRESERVISION/LUTEIN PO)  . NITROSTAT 0.4 MG SL tablet  . NONFORMULARY OR COMPOUNDED ITEM  . Vitamin D, Ergocalciferol, (DRISDOL) 1.25 MG (50000 UT) CAPS capsule   No current facility-administered medications for this encounter.     Myra Gianotti, PA-C Surgical Short Stay/Anesthesiology [facility] Phone [phone] [facility] Phone [phone] 11/17/2018 10:21 AM

## 2018-11-17 NOTE — Anesthesia Preprocedure Evaluation (Addendum)
Anesthesia Evaluation  Patient identified by MRN, date of birth, ID band Patient awake    Reviewed: Allergy & Precautions, NPO status , Patient's Chart, lab work & pertinent test results  History of Anesthesia Complications Negative for: history of anesthetic complications  Airway Mallampati: II  TM Distance: >3 FB Neck ROM: Full    Dental no notable dental hx. (+) Dental Advisory Given   Pulmonary former smoker,    Pulmonary exam normal        Cardiovascular hypertension, Pt. on medications and Pt. on home beta blockers + CAD, + Past MI and + Cardiac Stents  Normal cardiovascular exam+ dysrhythmias Ventricular Tachycardia   Preoperative cardiology input as outlined by Coletta Memos, NP on 11/07/18: "Patient statesshe is able to walk up 2 flights of stairs or walk [place] without chest pain,chest pressure,increased dyspnea diaphoresis or discomfort.She also states he is able tocomplete 3 or more exercise classes a week.No further cardiac evaluation recommended at this time.HerRCRI riskis class III,with a 6.6% risk of a major cardiac event."  Echo 08/24/16: Study Conclusions - Left ventricle: The cavity size was normal. Wall thickness was normal. Systolic function was normal. The estimated ejection fraction was in the range of 55% to 60%. Wall motion was normal; there were no regional wall motion abnormalities. Doppler parameters are consistent with abnormal left ventricular relaxation (grade 1 diastolic dysfunction). - Mitral valve: Calcified annulus. There was mild regurgitation. Impressions: - Normal LV systolic function; mild diastolic dysfunction; mild MR.  Nuclear stress test 08/24/16: Study Highlights  Nuclear stress EF: 71%.  There was no ST segment deviation noted during stress.  The study is normal.  This is a low risk study.  The left ventricular ejection fraction is  hyperdynamic (>65%). Normal Lexiscan nuclear stress test with no evidence for prior infarct or ischemia.    Neuro/Psych PSYCHIATRIC DISORDERS Anxiety negative neurological ROS     GI/Hepatic negative GI ROS, Neg liver ROS,   Endo/Other  negative endocrine ROS  Renal/GU negative Renal ROS     Musculoskeletal  (+) Arthritis ,   Abdominal   Peds  Hematology negative hematology ROS (+)   Anesthesia Other Findings Day of surgery medications reviewed with the patient.  Reproductive/Obstetrics                           Anesthesia Physical Anesthesia Plan  ASA: III  Anesthesia Plan: General   Post-op Pain Management:    Induction: Intravenous  PONV Risk Score and Plan: 3 and Ondansetron, Dexamethasone, Diphenhydramine and Treatment may vary due to age or medical condition  Airway Management Planned: Oral ETT  Additional Equipment:   Intra-op Plan:   Post-operative Plan: Extubation in OR  Informed Consent: I have reviewed the patients History and Physical, chart, labs and discussed the procedure including the risks, benefits and alternatives for the proposed anesthesia with the patient or authorized representative who has indicated his/her understanding and acceptance.     Dental advisory given  Plan Discussed with: Anesthesiologist, CRNA and Surgeon  Anesthesia Plan Comments: (PAT note written 11/17/2018 by Myra Gianotti, PA-C. )      Anesthesia Quick Evaluation

## 2018-11-18 ENCOUNTER — Encounter (HOSPITAL_COMMUNITY): Payer: Self-pay | Admitting: Certified Registered Nurse Anesthetist

## 2018-11-18 ENCOUNTER — Inpatient Hospital Stay (HOSPITAL_COMMUNITY)
Admission: RE | Admit: 2018-11-18 | Discharge: 2018-11-19 | DRG: 460 | Disposition: A | Discharge status: Skilled Nursing Facility | Payer: Medicare Other | Attending: Neurological Surgery | Admitting: Neurological Surgery

## 2018-11-18 ENCOUNTER — Inpatient Hospital Stay (HOSPITAL_COMMUNITY): Payer: Medicare Other | Admitting: Physician Assistant

## 2018-11-18 ENCOUNTER — Encounter (HOSPITAL_COMMUNITY)
Admission: RE | Disposition: A | Discharge status: Skilled Nursing Facility | Payer: Self-pay | Source: Home / Self Care | Attending: Neurological Surgery

## 2018-11-18 ENCOUNTER — Inpatient Hospital Stay (HOSPITAL_COMMUNITY): Payer: Medicare Other | Admitting: Certified Registered Nurse Anesthetist

## 2018-11-18 ENCOUNTER — Inpatient Hospital Stay (HOSPITAL_COMMUNITY): Payer: Medicare Other

## 2018-11-18 ENCOUNTER — Other Ambulatory Visit: Payer: Self-pay

## 2018-11-18 DIAGNOSIS — M48062 Spinal stenosis, lumbar region with neurogenic claudication: Secondary | ICD-10-CM | POA: Diagnosis present

## 2018-11-18 DIAGNOSIS — Z981 Arthrodesis status: Secondary | ICD-10-CM | POA: Diagnosis not present

## 2018-11-18 DIAGNOSIS — Z880 Allergy status to penicillin: Secondary | ICD-10-CM | POA: Diagnosis not present

## 2018-11-18 DIAGNOSIS — Z923 Personal history of irradiation: Secondary | ICD-10-CM

## 2018-11-18 DIAGNOSIS — F419 Anxiety disorder, unspecified: Secondary | ICD-10-CM | POA: Diagnosis present

## 2018-11-18 DIAGNOSIS — Z7289 Other problems related to lifestyle: Secondary | ICD-10-CM | POA: Diagnosis not present

## 2018-11-18 DIAGNOSIS — I252 Old myocardial infarction: Secondary | ICD-10-CM

## 2018-11-18 DIAGNOSIS — Z7951 Long term (current) use of inhaled steroids: Secondary | ICD-10-CM

## 2018-11-18 DIAGNOSIS — Z955 Presence of coronary angioplasty implant and graft: Secondary | ICD-10-CM

## 2018-11-18 DIAGNOSIS — M545 Low back pain: Secondary | ICD-10-CM | POA: Diagnosis present

## 2018-11-18 DIAGNOSIS — Z885 Allergy status to narcotic agent status: Secondary | ICD-10-CM

## 2018-11-18 DIAGNOSIS — Z20828 Contact with and (suspected) exposure to other viral communicable diseases: Secondary | ICD-10-CM | POA: Diagnosis present

## 2018-11-18 DIAGNOSIS — Z8674 Personal history of sudden cardiac arrest: Secondary | ICD-10-CM | POA: Diagnosis not present

## 2018-11-18 DIAGNOSIS — Z419 Encounter for procedure for purposes other than remedying health state, unspecified: Secondary | ICD-10-CM

## 2018-11-18 DIAGNOSIS — Z720 Tobacco use: Secondary | ICD-10-CM | POA: Diagnosis not present

## 2018-11-18 DIAGNOSIS — M4726 Other spondylosis with radiculopathy, lumbar region: Secondary | ICD-10-CM | POA: Diagnosis present

## 2018-11-18 DIAGNOSIS — Z79899 Other long term (current) drug therapy: Secondary | ICD-10-CM | POA: Diagnosis not present

## 2018-11-18 DIAGNOSIS — Z853 Personal history of malignant neoplasm of breast: Secondary | ICD-10-CM

## 2018-11-18 DIAGNOSIS — Z7982 Long term (current) use of aspirin: Secondary | ICD-10-CM | POA: Diagnosis not present

## 2018-11-18 DIAGNOSIS — I2510 Atherosclerotic heart disease of native coronary artery without angina pectoris: Secondary | ICD-10-CM | POA: Diagnosis present

## 2018-11-18 DIAGNOSIS — Z8249 Family history of ischemic heart disease and other diseases of the circulatory system: Secondary | ICD-10-CM

## 2018-11-18 DIAGNOSIS — Z823 Family history of stroke: Secondary | ICD-10-CM | POA: Diagnosis not present

## 2018-11-18 DIAGNOSIS — I10 Essential (primary) hypertension: Secondary | ICD-10-CM | POA: Diagnosis present

## 2018-11-18 DIAGNOSIS — E785 Hyperlipidemia, unspecified: Secondary | ICD-10-CM | POA: Diagnosis present

## 2018-11-18 DIAGNOSIS — Z803 Family history of malignant neoplasm of breast: Secondary | ICD-10-CM | POA: Diagnosis not present

## 2018-11-18 HISTORY — PX: ANTERIOR LAT LUMBAR FUSION: SHX1168

## 2018-11-18 SURGERY — ANTERIOR LATERAL LUMBAR FUSION 1 LEVEL
Anesthesia: General | Site: Spine Lumbar

## 2018-11-18 MED ORDER — LIDOCAINE 2% (20 MG/ML) 5 ML SYRINGE
INTRAMUSCULAR | Status: DC | PRN
  Administered 2018-11-18: 100 mg via INTRAVENOUS

## 2018-11-18 MED ORDER — LACTATED RINGERS IV SOLN: INTRAVENOUS | Status: DC

## 2018-11-18 MED ORDER — FENTANYL CITRATE (PF) 100 MCG/2ML IJ SOLN
25.0000 ug | INTRAMUSCULAR | Status: DC | PRN
  Administered 2018-11-18 (×3): 25 ug via INTRAVENOUS

## 2018-11-18 MED ORDER — ACETAMINOPHEN 650 MG RE SUPP: 650.0000 mg | RECTAL | Status: DC | PRN

## 2018-11-18 MED ORDER — SUCCINYLCHOLINE CHLORIDE 200 MG/10ML IV SOSY
PREFILLED_SYRINGE | INTRAVENOUS | Status: AC
  Filled 2018-11-18: qty 10

## 2018-11-18 MED ORDER — VANCOMYCIN HCL IN DEXTROSE 1-5 GM/200ML-% IV SOLN
INTRAVENOUS | Status: AC
  Administered 2018-11-18: 1000 mg via INTRAVENOUS
  Filled 2018-11-18: qty 200

## 2018-11-18 MED ORDER — FENTANYL CITRATE (PF) 250 MCG/5ML IJ SOLN
INTRAMUSCULAR | Status: AC
  Filled 2018-11-18: qty 5

## 2018-11-18 MED ORDER — PROPOFOL 500 MG/50ML IV EMUL
INTRAVENOUS | Status: DC | PRN
  Administered 2018-11-18: 20 ug/kg/min via INTRAVENOUS

## 2018-11-18 MED ORDER — SUCCINYLCHOLINE CHLORIDE 200 MG/10ML IV SOSY
PREFILLED_SYRINGE | INTRAVENOUS | Status: DC | PRN
  Administered 2018-11-18: 120 mg via INTRAVENOUS

## 2018-11-18 MED ORDER — LIDOCAINE-EPINEPHRINE 1 %-1:100000 IJ SOLN
INTRAMUSCULAR | Status: AC
  Filled 2018-11-18: qty 1

## 2018-11-18 MED ORDER — CELECOXIB 200 MG PO CAPS
ORAL_CAPSULE | ORAL | Status: AC
  Filled 2018-11-18: qty 1

## 2018-11-18 MED ORDER — ALUM & MAG HYDROXIDE-SIMETH 200-200-20 MG/5ML PO SUSP: 30.0000 mL | Freq: Four times a day (QID) | ORAL | Status: DC | PRN

## 2018-11-18 MED ORDER — THROMBIN 5000 UNITS EX SOLR
OROMUCOSAL | Status: DC | PRN
  Administered 2018-11-18: 5 mL

## 2018-11-18 MED ORDER — DEXAMETHASONE SODIUM PHOSPHATE 10 MG/ML IJ SOLN
INTRAMUSCULAR | Status: DC | PRN
  Administered 2018-11-18: 10 mg via INTRAVENOUS

## 2018-11-18 MED ORDER — PROMETHAZINE HCL 25 MG/ML IJ SOLN
6.2500 mg | INTRAMUSCULAR | Status: DC | PRN
  Administered 2018-11-18: 6.25 mg via INTRAVENOUS

## 2018-11-18 MED ORDER — DIPHENHYDRAMINE HCL 50 MG/ML IJ SOLN
INTRAMUSCULAR | Status: AC
  Filled 2018-11-18: qty 1

## 2018-11-18 MED ORDER — PROPOFOL 10 MG/ML IV BOLUS
INTRAVENOUS | Status: DC | PRN
  Administered 2018-11-18: 140 mg via INTRAVENOUS

## 2018-11-18 MED ORDER — DOCUSATE SODIUM 100 MG PO CAPS
100.0000 mg | ORAL_CAPSULE | Freq: Two times a day (BID) | ORAL | Status: DC
  Administered 2018-11-18 – 2018-11-19 (×2): 100 mg via ORAL
  Filled 2018-11-18 (×2): qty 1

## 2018-11-18 MED ORDER — ATORVASTATIN CALCIUM 40 MG PO TABS
40.0000 mg | ORAL_TABLET | Freq: Every day | ORAL | Status: DC
  Administered 2018-11-18: 20:00:00 40 mg via ORAL
  Filled 2018-11-18: qty 1

## 2018-11-18 MED ORDER — ALBUTEROL SULFATE HFA 108 (90 BASE) MCG/ACT IN AERS: 2.0000 | INHALATION_SPRAY | Freq: Every day | RESPIRATORY_TRACT | Status: DC | PRN

## 2018-11-18 MED ORDER — POLYETHYLENE GLYCOL 3350 17 G PO PACK: 17.0000 g | PACK | Freq: Every day | ORAL | Status: DC | PRN

## 2018-11-18 MED ORDER — BISACODYL 10 MG RE SUPP: 10.0000 mg | Freq: Every day | RECTAL | Status: DC | PRN

## 2018-11-18 MED ORDER — HYDROCHLOROTHIAZIDE 25 MG PO TABS
25.0000 mg | ORAL_TABLET | Freq: Every day | ORAL | Status: DC
  Administered 2018-11-18 – 2018-11-19 (×2): 25 mg via ORAL
  Filled 2018-11-18 (×2): qty 1

## 2018-11-18 MED ORDER — POLYVINYL ALCOHOL 1.4 % OP SOLN: 1.0000 [drp] | OPHTHALMIC | Status: DC | PRN

## 2018-11-18 MED ORDER — SOOTHE HYDRATION 1.25 % OP SOLN: Freq: Every day | OPHTHALMIC | Status: DC | PRN

## 2018-11-18 MED ORDER — ONDANSETRON HCL 4 MG/2ML IJ SOLN
INTRAMUSCULAR | Status: AC
  Filled 2018-11-18: qty 2

## 2018-11-18 MED ORDER — LORAZEPAM 0.5 MG PO TABS
1.0000 mg | ORAL_TABLET | Freq: Every day | ORAL | Status: DC
  Administered 2018-11-18: 1 mg via ORAL
  Filled 2018-11-18: qty 2

## 2018-11-18 MED ORDER — ALBUTEROL SULFATE (2.5 MG/3ML) 0.083% IN NEBU: 2.5000 mg | INHALATION_SOLUTION | Freq: Every day | RESPIRATORY_TRACT | Status: DC | PRN

## 2018-11-18 MED ORDER — KETAMINE HCL 50 MG/5ML IJ SOSY
PREFILLED_SYRINGE | INTRAMUSCULAR | Status: AC
  Filled 2018-11-18: qty 5

## 2018-11-18 MED ORDER — BUDESONIDE-FORMOTEROL FUMARATE 80-4.5 MCG/ACT IN AERO
2.0000 | INHALATION_SPRAY | Freq: Two times a day (BID) | RESPIRATORY_TRACT | Status: DC
  Administered 2018-11-18: 2 via RESPIRATORY_TRACT
  Filled 2018-11-18: qty 6.9

## 2018-11-18 MED ORDER — METHOCARBAMOL 500 MG PO TABS
500.0000 mg | ORAL_TABLET | Freq: Four times a day (QID) | ORAL | Status: DC | PRN
  Administered 2018-11-18 – 2018-11-19 (×2): 500 mg via ORAL
  Filled 2018-11-18 (×2): qty 1

## 2018-11-18 MED ORDER — DEXAMETHASONE SODIUM PHOSPHATE 10 MG/ML IJ SOLN
INTRAMUSCULAR | Status: AC
  Filled 2018-11-18: qty 1

## 2018-11-18 MED ORDER — MORPHINE SULFATE (PF) 2 MG/ML IV SOLN: 2.0000 mg | INTRAVENOUS | Status: DC | PRN

## 2018-11-18 MED ORDER — DIPHENHYDRAMINE HCL 50 MG/ML IJ SOLN
INTRAMUSCULAR | Status: DC | PRN
  Administered 2018-11-18: 12.5 mg via INTRAVENOUS

## 2018-11-18 MED ORDER — THROMBIN 5000 UNITS EX SOLR
CUTANEOUS | Status: AC
  Filled 2018-11-18: qty 5000

## 2018-11-18 MED ORDER — SENNA 8.6 MG PO TABS
1.0000 | ORAL_TABLET | Freq: Two times a day (BID) | ORAL | Status: DC
  Administered 2018-11-18 – 2018-11-19 (×2): 8.6 mg via ORAL
  Filled 2018-11-18 (×2): qty 1

## 2018-11-18 MED ORDER — SODIUM CHLORIDE 0.9 % IV SOLN
INTRAVENOUS | Status: DC | PRN
  Administered 2018-11-18: 12:00:00 500 mL

## 2018-11-18 MED ORDER — FENTANYL CITRATE (PF) 100 MCG/2ML IJ SOLN
INTRAMUSCULAR | Status: DC | PRN
  Administered 2018-11-18 (×2): 50 ug via INTRAVENOUS
  Administered 2018-11-18: 100 ug via INTRAVENOUS

## 2018-11-18 MED ORDER — HYDROCODONE-ACETAMINOPHEN 5-325 MG PO TABS
1.0000 | ORAL_TABLET | ORAL | Status: DC | PRN
  Administered 2018-11-18: 2 via ORAL
  Filled 2018-11-18: qty 2

## 2018-11-18 MED ORDER — ONDANSETRON HCL 4 MG PO TABS: 4.0000 mg | ORAL_TABLET | Freq: Four times a day (QID) | ORAL | Status: DC | PRN

## 2018-11-18 MED ORDER — VANCOMYCIN HCL IN DEXTROSE 1-5 GM/200ML-% IV SOLN
1000.0000 mg | INTRAVENOUS | Status: AC
  Administered 2018-11-18: 10:00:00 1000 mg via INTRAVENOUS

## 2018-11-18 MED ORDER — ONDANSETRON HCL 4 MG/2ML IJ SOLN: 4.0000 mg | Freq: Four times a day (QID) | INTRAMUSCULAR | Status: DC | PRN

## 2018-11-18 MED ORDER — SODIUM CHLORIDE 0.9% FLUSH: 3.0000 mL | Freq: Two times a day (BID) | INTRAVENOUS | Status: DC

## 2018-11-18 MED ORDER — ACETAMINOPHEN 500 MG PO TABS
1000.0000 mg | ORAL_TABLET | Freq: Once | ORAL | Status: AC
  Administered 2018-11-18: 10:00:00 500 mg via ORAL

## 2018-11-18 MED ORDER — ACETAMINOPHEN 325 MG PO TABS
650.0000 mg | ORAL_TABLET | ORAL | Status: DC | PRN
  Administered 2018-11-18: 650 mg via ORAL
  Filled 2018-11-18: qty 2

## 2018-11-18 MED ORDER — LISINOPRIL 20 MG PO TABS
40.0000 mg | ORAL_TABLET | Freq: Every day | ORAL | Status: DC
  Administered 2018-11-18 – 2018-11-19 (×2): 40 mg via ORAL
  Filled 2018-11-18 (×2): qty 2

## 2018-11-18 MED ORDER — OXYCODONE HCL 5 MG PO TABS
10.0000 mg | ORAL_TABLET | ORAL | Status: DC | PRN
  Administered 2018-11-18 – 2018-11-19 (×2): 10 mg via ORAL
  Filled 2018-11-18 (×3): qty 2

## 2018-11-18 MED ORDER — PHENOL 1.4 % MT LIQD: 1.0000 | OROMUCOSAL | Status: DC | PRN

## 2018-11-18 MED ORDER — MENTHOL 3 MG MT LOZG: 1.0000 | LOZENGE | OROMUCOSAL | Status: DC | PRN

## 2018-11-18 MED ORDER — KETOROLAC TROMETHAMINE 15 MG/ML IJ SOLN
7.5000 mg | Freq: Four times a day (QID) | INTRAMUSCULAR | Status: DC
  Administered 2018-11-18 – 2018-11-19 (×3): 7.5 mg via INTRAVENOUS
  Filled 2018-11-18 (×3): qty 1

## 2018-11-18 MED ORDER — BUPIVACAINE HCL (PF) 0.5 % IJ SOLN
INTRAMUSCULAR | Status: AC
  Filled 2018-11-18: qty 30

## 2018-11-18 MED ORDER — CELECOXIB 200 MG PO CAPS
400.0000 mg | ORAL_CAPSULE | Freq: Once | ORAL | Status: AC
  Administered 2018-11-18: 400 mg via ORAL

## 2018-11-18 MED ORDER — EPHEDRINE SULFATE-NACL 50-0.9 MG/10ML-% IV SOSY
PREFILLED_SYRINGE | INTRAVENOUS | Status: DC | PRN
  Administered 2018-11-18: 10 mg via INTRAVENOUS

## 2018-11-18 MED ORDER — BUPIVACAINE HCL (PF) 0.5 % IJ SOLN
INTRAMUSCULAR | Status: DC | PRN
  Administered 2018-11-18: 5 mL

## 2018-11-18 MED ORDER — NITROGLYCERIN 0.4 MG SL SUBL: 0.4000 mg | SUBLINGUAL_TABLET | SUBLINGUAL | Status: DC | PRN

## 2018-11-18 MED ORDER — KETAMINE HCL 10 MG/ML IJ SOLN
INTRAMUSCULAR | Status: DC | PRN
  Administered 2018-11-18: 40 mg via INTRAVENOUS

## 2018-11-18 MED ORDER — LIDOCAINE-EPINEPHRINE 1 %-1:100000 IJ SOLN
INTRAMUSCULAR | Status: DC | PRN
  Administered 2018-11-18: 5 mL

## 2018-11-18 MED ORDER — LIDOCAINE 2% (20 MG/ML) 5 ML SYRINGE
INTRAMUSCULAR | Status: AC
  Filled 2018-11-18: qty 5

## 2018-11-18 MED ORDER — CHLORHEXIDINE GLUCONATE CLOTH 2 % EX PADS: 6.0000 | MEDICATED_PAD | Freq: Once | CUTANEOUS | Status: DC

## 2018-11-18 MED ORDER — FLEET ENEMA 7-19 GM/118ML RE ENEM: 1.0000 | ENEMA | Freq: Once | RECTAL | Status: DC | PRN

## 2018-11-18 MED ORDER — ACETAMINOPHEN 500 MG PO TABS
ORAL_TABLET | ORAL | Status: AC
  Administered 2018-11-18: 500 mg via ORAL
  Filled 2018-11-18: qty 1

## 2018-11-18 MED ORDER — SODIUM CHLORIDE 0.9 % IV SOLN
INTRAVENOUS | Status: DC | PRN
  Administered 2018-11-18: 12:00:00 25 ug/min via INTRAVENOUS

## 2018-11-18 MED ORDER — METHOCARBAMOL 1000 MG/10ML IJ SOLN
500.0000 mg | Freq: Four times a day (QID) | INTRAVENOUS | Status: DC | PRN
  Filled 2018-11-18: qty 5

## 2018-11-18 MED ORDER — ONDANSETRON HCL 4 MG/2ML IJ SOLN
INTRAMUSCULAR | Status: DC | PRN
  Administered 2018-11-18: 4 mg via INTRAVENOUS

## 2018-11-18 MED ORDER — SODIUM CHLORIDE 0.9% FLUSH: 3.0000 mL | INTRAVENOUS | Status: DC | PRN

## 2018-11-18 MED ORDER — METOPROLOL TARTRATE 25 MG PO TABS
50.0000 mg | ORAL_TABLET | Freq: Two times a day (BID) | ORAL | Status: DC
  Administered 2018-11-18 – 2018-11-19 (×2): 50 mg via ORAL
  Filled 2018-11-18 (×2): qty 2

## 2018-11-18 MED ORDER — PROMETHAZINE HCL 25 MG/ML IJ SOLN
INTRAMUSCULAR | Status: AC
  Filled 2018-11-18: qty 1

## 2018-11-18 MED ORDER — LACTATED RINGERS IV SOLN
INTRAVENOUS | Status: DC
  Administered 2018-11-18 (×2): via INTRAVENOUS

## 2018-11-18 MED ORDER — 0.9 % SODIUM CHLORIDE (POUR BTL) OPTIME
TOPICAL | Status: DC | PRN
  Administered 2018-11-18: 12:00:00 1000 mL

## 2018-11-18 MED ORDER — MOMETASONE FURO-FORMOTEROL FUM 100-5 MCG/ACT IN AERO
2.0000 | INHALATION_SPRAY | Freq: Two times a day (BID) | RESPIRATORY_TRACT | Status: DC
  Administered 2018-11-19: 09:00:00 2 via RESPIRATORY_TRACT
  Filled 2018-11-18: qty 8.8

## 2018-11-18 MED ORDER — FENTANYL CITRATE (PF) 100 MCG/2ML IJ SOLN
INTRAMUSCULAR | Status: AC
  Filled 2018-11-18: qty 2

## 2018-11-18 SURGICAL SUPPLY — 42 items
ADH SKN CLS APL DERMABOND .7 (GAUZE/BANDAGES/DRESSINGS) ×1
BLADE CLIPPER SURG (BLADE) IMPLANT
DERMABOND ADVANCED (GAUZE/BANDAGES/DRESSINGS) ×1
DERMABOND ADVANCED .7 DNX12 (GAUZE/BANDAGES/DRESSINGS) ×1 IMPLANT
DRAPE C-ARM 42X72 X-RAY (DRAPES) ×2 IMPLANT
DRAPE C-ARMOR (DRAPES) ×2 IMPLANT
DRAPE LAPAROTOMY 100X72X124 (DRAPES) ×2 IMPLANT
DURAPREP 26ML APPLICATOR (WOUND CARE) ×2 IMPLANT
ELECT REM PT RETURN 9FT ADLT (ELECTROSURGICAL) ×2
ELECTRODE REM PT RTRN 9FT ADLT (ELECTROSURGICAL) ×1 IMPLANT
GAUZE 4X4 16PLY RFD (DISPOSABLE) IMPLANT
GLOVE BIOGEL PI IND STRL 8.5 (GLOVE) ×1 IMPLANT
GLOVE BIOGEL PI INDICATOR 8.5 (GLOVE) ×1
GLOVE ECLIPSE 8.5 STRL (GLOVE) ×2 IMPLANT
GOWN STRL REUS W/ TWL LRG LVL3 (GOWN DISPOSABLE) IMPLANT
GOWN STRL REUS W/ TWL XL LVL3 (GOWN DISPOSABLE) ×1 IMPLANT
GOWN STRL REUS W/TWL 2XL LVL3 (GOWN DISPOSABLE) ×2 IMPLANT
GOWN STRL REUS W/TWL LRG LVL3 (GOWN DISPOSABLE) ×2
GOWN STRL REUS W/TWL XL LVL3 (GOWN DISPOSABLE) ×2
HEMOSTAT POWDER KIT SURGIFOAM (HEMOSTASIS) ×1 IMPLANT
KIT BASIN OR (CUSTOM PROCEDURE TRAY) ×2 IMPLANT
KIT DILATOR XLIF 5 (KITS) ×1 IMPLANT
KIT INFUSE XX SMALL 0.7CC (Orthopedic Implant) ×1 IMPLANT
KIT SURGICAL ACCESS MAXCESS 4 (KITS) ×1 IMPLANT
KIT TURNOVER KIT B (KITS) ×2 IMPLANT
MODULE NVM5 NEXT GEN EMG (NEEDLE) ×1 IMPLANT
MODULUS XLW 10X22X50MM 10DEG (Spine Construct) ×1 IMPLANT
NDL HYPO 25X1 1.5 SAFETY (NEEDLE) ×1 IMPLANT
NEEDLE HYPO 25X1 1.5 SAFETY (NEEDLE) ×2 IMPLANT
NS IRRIG 1000ML POUR BTL (IV SOLUTION) ×2 IMPLANT
PACK LAMINECTOMY NEURO (CUSTOM PROCEDURE TRAY) ×2 IMPLANT
PLATE DECADE XLIP 2H SZ12 (Plate) ×1 IMPLANT
PUTTY BONE ATTRAX 5CC STRIP (Putty) ×1 IMPLANT
SCREW DECADE 5.5X50 (Screw) ×2 IMPLANT
SPONGE LAP 4X18 RFD (DISPOSABLE) IMPLANT
SUT VIC AB 2-0 CP2 18 (SUTURE) ×2 IMPLANT
SUT VIC AB 3-0 SH 8-18 (SUTURE) ×2 IMPLANT
TAPE CLOTH 4X10 WHT NS (GAUZE/BANDAGES/DRESSINGS) ×5 IMPLANT
TOWEL GREEN STERILE (TOWEL DISPOSABLE) ×2 IMPLANT
TOWEL GREEN STERILE FF (TOWEL DISPOSABLE) ×2 IMPLANT
TRAY FOLEY MTR SLVR 16FR STAT (SET/KITS/TRAYS/PACK) ×2 IMPLANT
WATER STERILE IRR 1000ML POUR (IV SOLUTION) ×2 IMPLANT

## 2018-11-18 NOTE — Op Note (Signed)
Date of surgery: November 18, 2018 Preoperative diagnosis: Lumbar stenosis with retrolisthesis L1-L2.  History of fusion L2-L5.  Lumbar radiculopathy. Postoperative diagnosis: Same Procedure: Anterolateral decompression L1-L2 using X-LIF spacer allograft and infuse with lateral plate fixation [id number].  Fluoroscopic guidance.  EMG monitoring. Surgeon: Kristeen Miss Anesthesia: General endotracheal Indications: Rachel Chase is a 78 year old individual who underwent surgical decompression and stabilization from L2 L5 in 2014.  She had done well but recently started having increasing back pain and leg pain.  She is found to have advanced degenerative changes at L1-L2 and is been advised regarding surgery to decompress and stabilize that level.  Procedure: Patient was brought to the operating room supine on a stretcher.  After the smooth induction of general endotracheal anesthesia without neuromuscular blockade she was placed in the right lateral decubitus position and orthogonal fluoroscopic images were obtained to check positioning.  She was then taped into place and the bony prominences were appropriately secured.  EMG monitoring was performed in the major muscle groups of the lower extremities throughout the case.  The lateral aspect of her spine on the left side was marked with fluoroscopic guidance to localize the path of weight to L1-L2.  The skin above this area was prepped with alcohol DuraPrep and draped in a sterile fashion.  A small incision was made in the lateral aspect and carried down through the superior fascia a second incision was made posteriorly to access the retroperitoneal space which was done with a Kelly clamp and then a finger was inserted into the retroperitoneal space to guide a laterally placed guide tube under the diaphragm and through the lateral aspect of the crura into the disc space at the L1-L2 space.  This was confirmed radiographically and EMG monitoring was performed to see if  there is any elements of the lumbar plexus.  None were noted.  A series of dilators were passed over the initial tube after a K wire was placed into the disc space to secure the tube.  Then 150 mm long retractor was placed over the dilating tubes and secured to the operating table with a clamp.  Radiographs were obtained in AP and lateral projection and then a shim was placed into the L1-L2 disc space with EMG guidance being used to make sure no ill neural elements were near the placement of the shim.  The retractor was then opened slightly to allow placement of lites and to directly dissect the lateral fascia and musculature over the interspace.  The disc space was then opened with a #15 blade and a combination of curettes and rongeurs was used to evacuate the disc of severely degenerated disc material.  A series of shavers was then used to help with the evacuation of the disc material and to shave the endplates free of any cartilaginous attachments.  Space was then opened with a series of dilators and ultimately it was felt that a 10 mm tall spacer measuring 22 mm in AP dimension and 50 mm in width would fit well into the interspace this had 10 degrees of lordosis.  The actual spacer itself was then packed with a combination of infuse and allograft paste.  This was then placed into the interspace under fluoroscopic guidance.  Attention was then turned to the lateral aspect to place a 12 mm tall plate across the interspace.  50 mm screws were used to secure L1 and L2 together under fluoroscopic guidance.  System was tightened down in a neutral construct however the  patient was noted to have a significant lateral bend which did not correct despite table positioning.  With this the wound was irrigated copiously final radiographs were obtained in AP and lateral projection all during this time neural monitoring was performed with no excessive activity of the neural elements.  The fascia was then closed with 2-0 Vicryl  in interrupted fashion 3-0 Vicryl was used subcuticularly Dermabond was placed on the skin and blood loss was estimated less than 50 cc.

## 2018-11-18 NOTE — H&P (Signed)
Rachel Chase is an 78 y.o. female.   Chief Complaint: Back and bilateral leg pain HPI: Rachel Chase is a 78 year old individual who has had previous decompression and fusion from L2-L5.  She has developed increasing back pain in the upper lumbar region in addition to neurogenic claudication symptoms in her lower extremities.  An MRI performed last December demonstrates that Rachel Chase has evidence of significant degenerative changes at L1-L2 with retrolisthesis and a moderately severe stenosis.  After discussing her options conservatively she has not been able to maintain a good level of function and I advised surgical decompression of L1-L2 using an anterolateral technique with lateral plate fixation.  She is now admitted for that procedure.  Past Medical History:  Diagnosis Date  . Anxiety   . Arthritis   . Back pain    Lower Back pain - takes Cortisone shots  . Bone spur 2009   lower back  . Breast cancer ([place]) 2015   finished radiation 12/15  . Bronchitis   . CAD (coronary artery disease)    a. cath 12/2010 for cardiac arrest showed   . Cataract   . Complication of anesthesia    throat irrition from past intubations, intubation granulomas  . Coronary artery disease   . Dyspnea on exertion   . History of cardiac arrest 12/2010  . Hyperlipemia   . Hypertension   . MI (myocardial infarction) ([place]) 12/2010   Was followed by Dr. Pernell Dupre  . Personal history of radiation therapy   . 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 Radiation 01/06/14-02/04/14   left breast   . Rosacea 2008   both eyes    Past Surgical History:  Procedure Laterality Date  . BACK Chase    . BREAST BIOPSY  8/10   fibrocystic changes  . BREAST LUMPECTOMY Left 2015  . BREAST LUMPECTOMY WITH NEEDLE LOCALIZATION AND AXILLARY SENTINEL LYMPH NODE BX Left 11/13/2013   Procedure: NEEDLE LOCALIZATION LEFT BREAST LUMPECTOMY WITH LEFT   AXILLARY SENTINEL LYMPH NODE BX;  Surgeon: Excell Seltzer, MD;  Location: [place];  Service: General;  Laterality: Left;  . CATARACT EXTRACTION     both  . Rachel Chase approx 2007  . heart stent     failed-no stents  . HYSTEROSCOPY  9/10   D&C (polyp)  . LEFT HEART CATHETERIZATION WITH CORONARY ANGIOGRAM N/A 01/18/2011   Procedure: LEFT HEART CATHETERIZATION WITH CORONARY ANGIOGRAM;  Surgeon: Clent Demark, MD;  Location: [facility] CATH LAB;  Service: Cardiovascular;  Laterality: N/A;  . PERCUTANEOUS CORONARY STENT INTERVENTION (PCI-S) N/A 01/18/2011   Procedure: PERCUTANEOUS CORONARY STENT INTERVENTION (PCI-S);  Surgeon: Clent Demark, MD;  Location: [facility] CATH LAB;  Service: Cardiovascular;  Laterality: N/A;  . TONSILLECTOMY AND ADENOIDECTOMY    . TUBAL LIGATION      Family History  Problem Relation Age of Onset  . Congestive Heart Failure Father   . CVA Father        MI, CAD  . Coronary artery disease Father   . Valvular heart disease Mother   . Hypertension Mother   . Rectal cancer Sister   . Breast cancer Sister   . Breast cancer Maternal Grandmother 90  . Breast cancer Maternal Aunt   . Cancer Sister    Social History:  reports that she quit smoking about 20 years ago. Her smoking use  included cigarettes. She has a 67.50 pack-year smoking history. She uses smokeless tobacco. She reports current alcohol use of about 1.0 - 2.0 standard drinks of alcohol per week. She reports that she does not use drugs.  Allergies:  Allergies  Allergen Reactions  . Dilaudid [Hydromorphone Hcl] Nausea And Vomiting  . Penicillins Rash    Medications Prior to Admission  Medication Sig Dispense Refill  . Artificial Tear Solution (SOOTHE HYDRATION OP) Place 1 drop into both eyes daily as needed (dry eyes).    Marland Kitchen aspirin EC 81 MG tablet Take 81 mg by mouth daily with supper.    Marland Kitchen atorvastatin (LIPITOR) 40 MG tablet Take 40 mg by mouth at bedtime.     .  Budesonide-Formoterol Fumarate (SYMBICORT IN) Inhale 2 puffs into the lungs 2 (two) times daily.    . hydrochlorothiazide (HYDRODIURIL) 25 MG tablet Take 25 mg by mouth daily.     Marland Kitchen HYDROcodone-acetaminophen (NORCO/VICODIN) 5-325 MG per tablet Take 1 tablet by mouth daily. 1/2 tab in am and 1/2 tab night    . lisinopril (PRINIVIL,ZESTRIL) 40 MG tablet Take 40 mg by mouth daily.     Marland Kitchen LORazepam (ATIVAN) 1 MG tablet Take 1 mg by mouth at bedtime.    . metoprolol (LOPRESSOR) 50 MG tablet Take 50 mg by mouth 2 (two) times daily.    . Multiple Vitamins-Minerals (PRESERVISION/LUTEIN PO) Take 1 tablet by mouth 2 (two) times daily.     . NONFORMULARY OR COMPOUNDED ITEM Vit E vaginal suppositories 200mg /ml.  One pv three times weekly. 36 each 4  . Vitamin D, Ergocalciferol, (DRISDOL) 1.25 MG (50000 UT) CAPS capsule TAKE ONE CAPSULE (50 000 UNITS) BY MOUTHEVERY 14 DAYS 12 capsule 1  . albuterol (PROVENTIL HFA;VENTOLIN HFA) 108 (90 BASE) MCG/ACT inhaler Inhale 2 puffs into the lungs daily as needed for wheezing or shortness of breath.    Marland Kitchen NITROSTAT 0.4 MG SL tablet DISSOLVE ONE TABLET UNDER THE TONGUE AS NEEDED FOR CHEST PAIN AS DIRECTED 25 tablet 2    No results found for this or any previous visit (from the past 48 hour(s)). No results found.  Review of Systems  Constitutional: Negative.   HENT: Negative.   Eyes: Negative.   Respiratory: Negative.   Cardiovascular: Negative.   Gastrointestinal: Negative.   Genitourinary: Negative.   Musculoskeletal: Positive for back pain.  Skin: Negative.   Neurological: Positive for tingling and weakness.  Endo/Heme/Allergies: Negative.  Negative for environmental allergies.  Psychiatric/Behavioral: Negative.     Blood pressure (!) 129/47, pulse 66, temperature 98.4 F (36.9 C), temperature source Oral, height 5' 6.5" (1.689 m), weight 81.1 kg, last menstrual period 02/27/2008, SpO2 98 %. Physical Exam  Constitutional: She is oriented to person, place,  and time. She appears well-developed and well-nourished.  HENT:  Head: Normocephalic and atraumatic.  Eyes: Pupils are equal, round, and reactive to light. Conjunctivae and EOM are normal.  Neck: Normal range of motion. Neck supple.  Cardiovascular: Normal rate and regular rhythm.  Respiratory: Effort normal and breath sounds normal.  GI: Soft. Bowel sounds are normal.  Musculoskeletal: Normal range of motion.  Neurological: She is alert and oriented to person, place, and time. She has normal reflexes.  Skin: Skin is warm and dry.  Psychiatric: She has a normal mood and affect. Her behavior is normal. Judgment and thought content normal.     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  History of decompression and fusion L2-L5.  Plan: Anterolateral  decompression L1-L2 with X-LIF spacer allograft arthrodesis, lateral plate fixation.  Earleen Newport, MD 11/18/2018, 11:08 AM

## 2018-11-18 NOTE — Progress Notes (Signed)
Patient ID: Rachel Chase, female   DOB: Jul 12, 1940, 78 y.o.   MRN: [id number] Patient is awake and alert moving lower extremities well complains of some mid and upper back pain incisions are clean and dry

## 2018-11-18 NOTE — TOC Initial Note (Signed)
Transition of Care [facility]) - Initial/Assessment Note    Patient Details  Name: Rachel Chase MRN: [id number] Date of Birth: 1940-12-23  Transition of Care [facility]) CM/SW Contact:    Benard Halsted, LCSW Phone Number: 11/18/2018, 5:09 PM  Clinical Narrative:                 Patient resides at [facility] and requests to discharge to their rehab side. CSW spoke with Whitney in admissions and she has already spoken with patient and started insurance authorization process. She requests an updated COVID test as well. CSW will follow up on insurance tomorrow after therapy has worked with patient.   Expected Discharge Plan: Skilled Nursing Facility Barriers to Discharge: Continued Medical Work up, Ship broker   Patient Goals and CMS Choice Patient states their goals for this hospitalization and ongoing recovery are:: Rehab CMS Medicare.gov Compare Post Acute Care list provided to:: Patient Choice offered to / list presented to : Patient  Expected Discharge Plan and Services Expected Discharge Plan: [place] In-house Referral: Clinical Social Work Discharge Planning Services: NA Post Acute Care Choice: [place] Living arrangements for the past 2 months: [place]                 DME Arranged: N/A DME Agency: NA       HH Arranged: NA [place] Agency: NA        Prior Living Arrangements/Services Living arrangements for the past 2 months: [place] Lives with:: Self, Facility Resident Patient language and need for interpreter reviewed:: Yes Do you feel safe going back to the place where you live?: Yes      Need for Family Participation in Patient Care: No (Comment) Care giver support system in place?: Yes (comment) Current home services: DME Criminal Activity/Legal Involvement Pertinent to Current Situation/Hospitalization: No - Comment as needed  Activities of Daily Living       Permission Sought/Granted Permission sought to share information with : Facility Art therapist granted to share information with : Yes, Verbal Permission Granted     Permission granted to share info w AGENCY: [facility]        Emotional Assessment Appearance:: Appears stated age Attitude/Demeanor/Rapport: Engaged Affect (typically observed): Accepting, Appropriate Orientation: : Oriented to Self, Oriented to Place, Oriented to  Time, Oriented to Situation Alcohol / Substance Use: Not Applicable Psych Involvement: No (comment)  Admission diagnosis:  Spinal stenosis, Lumbar region with neurogenic claudication Patient Active Problem List   Diagnosis Date Noted  . Lumbar stenosis with neurogenic claudication 11/18/2018  . CAD (coronary artery disease), native coronary artery 10/04/2016  . Lung nodule, solitary 06/12/2016  . Hyperlipemia 11/16/2013  . Primary cancer of lower-inner quadrant of left female breast ([place]) 10/21/2013  . 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

## 2018-11-18 NOTE — Anesthesia Postprocedure Evaluation (Signed)
Anesthesia Post Note  Patient: Rachel Chase  Procedure(s) Performed: Lumbar One-Two Anterolateral lumbar decompression/fusion/lateral plate fixation (N/A Spine Lumbar)     Patient location during evaluation: PACU Anesthesia Type: General Level of consciousness: sedated Pain management: pain level controlled Vital Signs Assessment: post-procedure vital signs reviewed and stable Respiratory status: spontaneous breathing and respiratory function stable Cardiovascular status: stable Postop Assessment: no apparent nausea or vomiting Anesthetic complications: no    Last Vitals:  Vitals:   11/18/18 1410 11/18/18 1424  BP: (!) 142/69 (!) 165/68  Pulse: 81 79  Resp: 15 17  Temp:  36.8 C  SpO2: 95% 95%    Last Pain:  Vitals:   11/18/18 1520  TempSrc:   PainSc: 3                  [name] DANIEL

## 2018-11-18 NOTE — Anesthesia Procedure Notes (Signed)
Procedure Name: Intubation Date/Time: 11/18/2018 11:33 AM Performed by: Candis Shine, CRNA Pre-anesthesia Checklist: Patient identified, Emergency Drugs available, Suction available and Patient being monitored Patient Re-evaluated:Patient Re-evaluated prior to induction Oxygen Delivery Method: Circle System Utilized Preoxygenation: Pre-oxygenation with 100% oxygen Induction Type: IV induction Laryngoscope Size: Miller and 2 Grade View: Grade III Tube type: Oral Tube size: 7.0 mm Number of attempts: 2 Airway Equipment and Method: Bougie stylet Placement Confirmation: ETT inserted through vocal cords under direct vision,  positive ETCO2 and breath sounds checked- equal and bilateral Secured at: 22 cm Tube secured with: Tape Dental Injury: Bloody posterior oropharynx  Difficulty Due To: Difficult Airway- due to anterior larynx Future Recommendations: Recommend- induction with short-acting agent, and alternative techniques readily available Comments: DL by CRNA with Mac 3, grade III view, unable to pass bougie. DL by MDA with Miller 2, grade III view, bougie passed with successful intubation.

## 2018-11-18 NOTE — Transfer of Care (Signed)
Immediate Anesthesia Transfer of Care Note  Patient: Rachel Chase  Procedure(s) Performed: Lumbar One-Two Anterolateral lumbar decompression/fusion/lateral plate fixation (N/A Spine Lumbar)  Patient Location: PACU  Anesthesia Type:General  Level of Consciousness: drowsy  Airway & Oxygen Therapy: Patient Spontanous Breathing and Patient connected to face mask oxygen  Post-op Assessment: Report given to RN and Post -op Vital signs reviewed and stable  Post vital signs: Reviewed and stable  Last Vitals:  Vitals Value Taken Time  BP 158/66 11/18/18 1324  Temp    Pulse 78 11/18/18 1327  Resp 16 11/18/18 1327  SpO2 100 % 11/18/18 1327  Vitals shown include unvalidated device data.  Last Pain:  Vitals:   11/18/18 1000  TempSrc: Oral  PainSc:          Complications: No apparent anesthesia complications

## 2018-11-19 ENCOUNTER — Encounter (HOSPITAL_COMMUNITY): Payer: Self-pay | Admitting: Neurological Surgery

## 2018-11-19 LAB — NOVEL CORONAVIRUS, NAA (HOSP ORDER, SEND-OUT TO REF LAB; TAT 18-24 HRS): SARS-CoV-2, NAA: NOT DETECTED

## 2018-11-19 MED ORDER — OXYCODONE-ACETAMINOPHEN 5-325 MG PO TABS: 1.0000 | ORAL_TABLET | Freq: Four times a day (QID) | ORAL | 0 refills | Status: DC | PRN

## 2018-11-19 MED ORDER — OXYCODONE-ACETAMINOPHEN 5-325 MG PO TABS: 1.0000 | ORAL_TABLET | Freq: Four times a day (QID) | ORAL | Status: DC | PRN

## 2018-11-19 MED ORDER — METHOCARBAMOL 500 MG PO TABS: 500.0000 mg | ORAL_TABLET | Freq: Four times a day (QID) | ORAL | 3 refills | Status: DC | PRN

## 2018-11-19 NOTE — Evaluation (Signed)
Occupational Therapy Evaluation Patient Details Name: Rachel Chase MRN: [id number] DOB: 1940-05-19 Today's Date: 11/19/2018    History of Present Illness Pt is a 78 y/o female s/p L1-2 anterolateral decompression and fixation. PMH: anxiety, pain, prior back surgery, CAD, cataract, HTN, MI.    Clinical Impression   PTA patient independent.  Admitted for above and limited by pain and precaution adherence.  Lives at [facility] independent living, plans to dc to short term rehab initially.  She was educated on precautions, brace mgmt and wear schedule, ADL compensatory techniques, recommendations and safety.  She requires min assist for LB ADLs, supervision for transfers, and min assist for brace mgmt.  Mod cueing to adhere to precautions functionally, during ADls and IADL tasks (bending down to straighten bed).  All needs can be deferred to next venue of care.     Follow Up Recommendations  SNF    Equipment Recommendations  None recommended by OT    Recommendations for Other Services       Precautions / Restrictions Precautions Precautions: Back Precaution Booklet Issued: Yes (comment) Precaution Comments: reviewed back precatuions, requires mod cueing to adhere functionally Required Braces or Orthoses: Spinal Brace Spinal Brace: Lumbar corset;Applied in sitting position Restrictions Weight Bearing Restrictions: No      Mobility Bed Mobility Overal bed mobility: Modified Independent             General bed mobility comments: no assist required, good recall of log roll technique  Transfers Overall transfer level: Needs assistance   Transfers: Sit to/from Stand Sit to Stand: Supervision         General transfer comment: supervision for safety, cueing for posture/precautions     Balance Overall balance assessment: Mild deficits observed, not formally tested                                         ADL either performed or assessed with clinical  judgement   ADL Overall ADL's : Needs assistance/impaired     Grooming: Supervision/safety;Standing   Upper Body Bathing: Set up;Sitting   Lower Body Bathing: Supervison/ safety;Set up;Sit to/from stand;Cueing for compensatory techniques;Cueing for back precautions   Upper Body Dressing : Minimal assistance;Sitting Upper Body Dressing Details (indicate cue type and reason): for brace mgmt  Lower Body Dressing: Minimal assistance;Sit to/from stand;Cueing for back precautions;Cueing for compensatory techniques Lower Body Dressing Details (indicate cue type and reason): able to figure 4, but noted decreased reach to L LE in position due to tightness in R hip Toilet Transfer: Supervision/safety;Ambulation Toilet Transfer Details (indicate cue type and reason): simulated in room         Functional mobility during ADLs: Supervision/safety;Cueing for safety;Cueing for sequencing General ADL Comments: patient requires cueing for safety and precaution adherance functionally, educated on compensatory techniques, back precautions, and brace mgmt/wear schedule     Vision         Perception     Praxis      Pertinent Vitals/Pain Pain Assessment: Faces Faces Pain Scale: Hurts little more Pain Location: R hip/ lower back  Pain Descriptors / Indicators: Discomfort;Operative site guarding Pain Intervention(s): Monitored during session;Repositioned     Hand Dominance Right   Extremity/Trunk Assessment Upper Extremity Assessment Upper Extremity Assessment: Overall WFL for tasks assessed   Lower Extremity Assessment Lower Extremity Assessment: Defer to PT evaluation   Cervical / Trunk Assessment Cervical / Trunk Assessment:  Other exceptions Cervical / Trunk Exceptions: s/p spinal surgery   Communication Communication Communication: No difficulties   Cognition Arousal/Alertness: Awake/alert Behavior During Therapy: WFL for tasks assessed/performed Overall Cognitive Status: Within  Functional Limits for tasks assessed                                 General Comments: decreased recall and adherance to precautions    General Comments       Exercises     Shoulder Instructions      Home Living Family/patient expects to be discharged to:: Skilled nursing facility                                 Additional Comments: pt lives at [facility] independent living and plans to return to SNF for short rehab stay prior to dcing home       Prior Functioning/Environment Level of Independence: Independent                 OT Problem List: Decreased strength;Decreased activity tolerance;Decreased safety awareness;Decreased knowledge of use of DME or AE;Decreased knowledge of precautions;Pain      OT Treatment/Interventions:      OT Goals(Current goals can be found in the care plan section) Acute Rehab OT Goals Patient Stated Goal: to get to rehab OT Goal Formulation: With patient  OT Frequency:     Barriers to D/C:            Co-evaluation              AM-PAC OT "6 Clicks" Daily Activity     Outcome Measure Help from another person eating meals?: None Help from another person taking care of personal grooming?: A Little Help from another person toileting, which includes using toliet, bedpan, or urinal?: A Little Help from another person bathing (including washing, rinsing, drying)?: A Little Help from another person to put on and taking off regular upper body clothing?: A Little Help from another person to put on and taking off regular lower body clothing?: A Little 6 Click Score: 19   End of Session Equipment Utilized During Treatment: Back brace Nurse Communication: Mobility status  Activity Tolerance: Patient tolerated treatment well Patient left: with call bell/phone within reach;Other (comment)(seated EOB)  OT Visit Diagnosis: Pain Pain - Right/Left: Right Pain - part of body: Hip(lower back)                Time:  [id number] OT Time Calculation (min): 12 min Charges:  OT General Charges $OT Visit: 1 Visit OT Evaluation $OT Eval Low Complexity: 1 Low  Delight Stare, OT Acute Rehabilitation Services Pager [phone] Office [phone]  Delight Stare 11/19/2018, 9:19 AM

## 2018-11-19 NOTE — Discharge Summary (Signed)
Physician Discharge Summary  Patient ID: Rachel Chase MRN: [id number] DOB/AGE: 10-17-1940 78 y.o.  Admit date: 11/18/2018 Discharge date: 11/19/2018  Admission Diagnoses: Lumbar stenosis L1-L2 history of fusion L2-L5 lumbar radiculopathy.  Neurogenic claudication  Discharge Diagnoses: Lumbar stenosis L1-L2 history of fusion L2-L5, lumbar radiculopathy.  Neurogenic claudication. Active Problems:   Lumbar stenosis with neurogenic claudication   Discharged Condition: good  Hospital Course: Patient was admitted to undergo anterolateral decompression of L1-L2 with lateral plate fixation which he tolerated well.  She has some residual right hip pain.  Consults: None  Significant Diagnostic Studies: None  Treatments: Anterolateral decompression L1-L2 with lateral plate fixation.  Discharge Exam: Blood pressure (!) 136/54, pulse 88, temperature 98.4 F (36.9 C), temperature source Oral, resp. rate (!) 1, height 5' 6.5" (1.689 m), weight 81.1 kg, last menstrual period 02/27/2008, SpO2 99 %. Incisions clean and dry Station and gait are intact  Disposition: Discharge disposition: 03-Skilled Nursing Facility       Discharge Instructions    Diet - low sodium heart healthy   Complete by: As directed    Incentive spirometry RT   Complete by: As directed    Increase activity slowly   Complete by: As directed      Allergies as of 11/19/2018      Reactions   Dilaudid [hydromorphone Hcl] Nausea And Vomiting   Penicillins Rash      Medication List    STOP taking these medications   HYDROcodone-acetaminophen 5-325 MG tablet Commonly known as: NORCO/VICODIN     TAKE these medications   albuterol 108 (90 Base) MCG/ACT inhaler Commonly known as: VENTOLIN HFA Inhale 2 puffs into the lungs daily as needed for wheezing or shortness of breath.   aspirin EC 81 MG tablet Take 81 mg by mouth daily with supper.   atorvastatin 40 MG tablet Commonly known as: LIPITOR Take 40 mg by  mouth at bedtime.   hydrochlorothiazide 25 MG tablet Commonly known as: HYDRODIURIL Take 25 mg by mouth daily.   lisinopril 40 MG tablet Commonly known as: ZESTRIL Take 40 mg by mouth daily.   LORazepam 1 MG tablet Commonly known as: ATIVAN Take 1 mg by mouth at bedtime.   methocarbamol 500 MG tablet Commonly known as: ROBAXIN Take 1 tablet (500 mg total) by mouth every 6 (six) hours as needed for muscle spasms.   metoprolol tartrate 50 MG tablet Commonly known as: LOPRESSOR Take 50 mg by mouth 2 (two) times daily.   Nitrostat 0.4 MG SL tablet Generic drug: nitroGLYCERIN DISSOLVE ONE TABLET UNDER THE TONGUE AS NEEDED FOR CHEST PAIN AS DIRECTED   NONFORMULARY OR COMPOUNDED ITEM Vit E vaginal suppositories 200mg /ml.  One pv three times weekly.   oxyCODONE-acetaminophen 5-325 MG tablet Commonly known as: PERCOCET/ROXICET Take 1-2 tablets by mouth every 6 (six) hours as needed for moderate pain or severe pain.   PRESERVISION/LUTEIN PO Take 1 tablet by mouth 2 (two) times daily.   SOOTHE HYDRATION OP Place 1 drop into both eyes daily as needed (dry eyes).   SYMBICORT IN Inhale 2 puffs into the lungs 2 (two) times daily.   Vitamin D (Ergocalciferol) 1.25 MG (50000 UT) Caps capsule Commonly known as: DRISDOL TAKE ONE CAPSULE (50 000 UNITS) BY MOUTHEVERY 14 DAYS      Contact information for after-discharge care    Destination    [facility] .   Service: Skilled Chiropodist information: [address] [place] [phone]  Signed: Blanchie Dessert Lema Heinkel 11/19/2018, 9:25 AM

## 2018-11-19 NOTE — Discharge Instructions (Signed)

## 2018-11-19 NOTE — Evaluation (Signed)
Physical Therapy Evaluation Patient Details Name: Rachel Chase MRN: [id number] DOB: 1940-11-25 Today's Date: 11/19/2018   History of Present Illness  Pt is a 78 y/o female s/p L1-2 anterolateral decompression and fixation. PMH: anxiety, pain, prior back surgery, CAD, cataract, HTN, MI.   Clinical Impression  Pt admitted with above diagnosis. At the time of PT eval, pt was able to demonstrate transfers and ambulation with gross min guard assist to supervision for safety without an AD. Pt was educated on precautions, brace application/wearing schedule, appropriate activity progression, and car transfer. Pt reports she has already set up follow-up care at SNF portion of [facility]. Pt currently with functional limitations due to the deficits listed below (see PT Problem List). Pt will benefit from skilled PT to increase their independence and safety with mobility to allow discharge to the venue listed below.      Follow Up Recommendations SNF;Supervision/Assistance - 24 hour    Equipment Recommendations  None recommended by PT    Recommendations for Other Services       Precautions / Restrictions Precautions Precautions: Back Precaution Booklet Issued: Yes (comment) Precaution Comments: reviewed back precatuions, requires mod cueing to adhere functionally Required Braces or Orthoses: Spinal Brace Spinal Brace: Lumbar corset;Applied in sitting position Restrictions Weight Bearing Restrictions: No      Mobility  Bed Mobility Overal bed mobility: Modified Independent             General bed mobility comments: Pt was received standing in room about to go to restroom  Transfers Overall transfer level: Needs assistance Equipment used: None Transfers: Sit to/from Stand Sit to Stand: Supervision         General transfer comment: Light supervision for safety. No assist required but cues provided for posture.  Ambulation/Gait Ambulation/Gait assistance: Supervision Gait  Distance (Feet): 250 Feet Assistive device: None Gait Pattern/deviations: Step-through pattern;Decreased stride length Gait velocity: Decreased Gait velocity interpretation: 1.31 - 2.62 ft/sec, indicative of limited community ambulator General Gait Details: Decreased floor clearance noted. Pt not shuffling her feet however do feel she was limited on heel strike bilaterally. Overall no overt LOB and pt tolerated hallway ambulation well.   Stairs            Wheelchair Mobility    Modified Rankin (Stroke Patients Only)       Balance Overall balance assessment: Mild deficits observed, not formally tested                                           Pertinent Vitals/Pain Pain Assessment: Faces Faces Pain Scale: Hurts a little bit Pain Location: R hip/ lower back  Pain Descriptors / Indicators: Discomfort;Operative site guarding Pain Intervention(s): Limited activity within patient's tolerance;Monitored during session;Repositioned    Home Living Family/patient expects to be discharged to:: Skilled nursing facility                 Additional Comments: pt lives at [facility] independent living and plans to return to SNF for short rehab stay prior to dcing home     Prior Function Level of Independence: Independent               Hand Dominance   Dominant Hand: Right    Extremity/Trunk Assessment   Upper Extremity Assessment Upper Extremity Assessment: Defer to OT evaluation    Lower Extremity Assessment Lower Extremity Assessment: Generalized weakness(consistent with pre-op  diagnosis)    Cervical / Trunk Assessment Cervical / Trunk Assessment: Other exceptions Cervical / Trunk Exceptions: s/p spinal surgery  Communication   Communication: No difficulties  Cognition Arousal/Alertness: Awake/alert Behavior During Therapy: WFL for tasks assessed/performed Overall Cognitive Status: Within Functional Limits for tasks assessed                                  General Comments: decreased recall and adherance to precautions       General Comments      Exercises     Assessment/Plan    PT Assessment Patient needs continued PT services  PT Problem List Decreased strength;Decreased activity tolerance;Decreased balance;Decreased mobility;Decreased knowledge of use of DME;Decreased safety awareness;Decreased knowledge of precautions;Pain       PT Treatment Interventions DME instruction;Gait training;Stair training;Functional mobility training;Therapeutic activities;Therapeutic exercise;Neuromuscular re-education;Patient/family education    PT Goals (Current goals can be found in the Care Plan section)  Acute Rehab PT Goals Patient Stated Goal: to get to rehab PT Goal Formulation: With patient Time For Goal Achievement: 11/26/18 Potential to Achieve Goals: Good    Frequency Min 5X/week   Barriers to discharge        Co-evaluation               AM-PAC PT "6 Clicks" Mobility  Outcome Measure Help needed turning from your back to your side while in a flat bed without using bedrails?: None Help needed moving from lying on your back to sitting on the side of a flat bed without using bedrails?: None Help needed moving to and from a bed to a chair (including a wheelchair)?: None Help needed standing up from a chair using your arms (e.g., wheelchair or bedside chair)?: None Help needed to walk in hospital room?: A Little Help needed climbing 3-5 steps with a railing? : A Little 6 Click Score: 22    End of Session Equipment Utilized During Treatment: Gait belt;Back brace Activity Tolerance: Patient tolerated treatment well Patient left: with call bell/phone within reach(Sitting EOB with brace donned) Nurse Communication: Mobility status PT Visit Diagnosis: Unsteadiness on feet (R26.81);Pain Pain - part of body: (back)    Time: 1010-1027 PT Time Calculation (min) (ACUTE ONLY): 17 min   Charges:    PT Evaluation $PT Eval Low Complexity: 1 Low          Rolinda Roan, PT, DPT Acute Rehabilitation Services Pager: [phone] Office: [phone]   Thelma Comp 11/19/2018, 10:39 AM

## 2018-11-19 NOTE — [place] FL2 (Signed)
[place] MEDICAID FL2 LEVEL OF CARE SCREENING TOOL     IDENTIFICATION  Patient Name: Rachel Chase Birthdate: 1940-03-29 Sex: female Admission Date (Current Location): 11/18/2018  [employer] and [place] Number:  Herbalist and Address:  [facility], [place] [address] [place], [place] [address]      Provider Number: [id number]  Attending Physician Name and Address:  Kristeen Miss, MD  Relative Name and Phone Number:  Judson Roch, sister, [phone]    Current Level of Care: Hospital Recommended Level of Care: [place] Prior Approval Number:    Date Approved/Denied:   PASRR Number: [id number]     (In the system as [name])  Discharge Plan: SNF    Current Diagnoses: Patient Active Problem List   Diagnosis Date Noted  . Lumbar stenosis with neurogenic claudication 11/18/2018  . CAD (coronary artery disease), native coronary artery 10/04/2016  . Lung nodule, solitary 06/12/2016  . Hyperlipemia 11/16/2013  . Primary cancer of lower-inner quadrant of left female breast ([place]) 10/21/2013  . Spinal stenosis of lumbar region L2-5 12/19/2012    Orientation RESPIRATION BLADDER Height & Weight     Self, Time, Situation, Place  Normal Continent Weight: 178 lb 11.2 oz (81.1 kg) Height:  5' 6.5" (168.9 cm)  BEHAVIORAL SYMPTOMS/MOOD NEUROLOGICAL BOWEL NUTRITION STATUS      Continent Diet(Please see DC Summary)  AMBULATORY STATUS COMMUNICATION OF NEEDS Skin   Extensive Assist Verbally Surgical wounds(Closed incision on back and groin)                       Personal Care Assistance Level of Assistance  Bathing, Feeding, Dressing Bathing Assistance: Maximum assistance Feeding assistance: Limited assistance Dressing Assistance: Limited assistance     Functional Limitations Info  Sight, Hearing, Speech Sight Info: Adequate Hearing Info: Adequate Speech Info: Adequate    SPECIAL CARE FACTORS FREQUENCY  PT (By licensed  PT), OT (By licensed OT)     PT Frequency: 5x OT Frequency: 5x            Contractures Contractures Info: Not present    Additional Factors Info  Code Status, Allergies Code Status Info: Full Allergies Info: Dilaudid (Hydromorphone Hcl), Penicillins           Current Medications (11/19/2018):  This is the current hospital active medication list Current Facility-Administered Medications  Medication Dose Route Frequency Provider Last Rate Last Dose  . acetaminophen (TYLENOL) tablet 650 mg  650 mg Oral Q4H PRN Kristeen Miss, MD   650 mg at 11/18/18 2006   Or  . acetaminophen (TYLENOL) suppository 650 mg  650 mg Rectal Q4H PRN Kristeen Miss, MD      . albuterol (PROVENTIL) (2.5 MG/3ML) 0.083% nebulizer solution 2.5 mg  2.5 mg Nebulization Daily PRN Kristeen Miss, MD      . alum & mag hydroxide-simeth (MAALOX/MYLANTA) 200-200-20 MG/5ML suspension 30 mL  30 mL Oral Q6H PRN Kristeen Miss, MD      . atorvastatin (LIPITOR) tablet 40 mg  40 mg Oral Antoine Poche, MD   40 mg at 11/18/18 2007  . bisacodyl (DULCOLAX) suppository 10 mg  10 mg Rectal Daily PRN Kristeen Miss, MD      . docusate sodium (COLACE) capsule 100 mg  100 mg Oral BID Kristeen Miss, MD   100 mg at 11/18/18 2006  . hydrochlorothiazide (HYDRODIURIL) tablet 25 mg  25 mg Oral Daily Kristeen Miss, MD   25 mg at  11/18/18 1547  . HYDROcodone-acetaminophen (NORCO/VICODIN) 5-325 MG per tablet 1-2 tablet  1-2 tablet Oral Q4H PRN Kristeen Miss, MD   2 tablet at 11/18/18 1546  . ketorolac (TORADOL) 15 MG/ML injection 7.5 mg  7.5 mg Intravenous Q6H Kristeen Miss, MD   7.5 mg at 11/19/18 0501  . lactated ringers infusion   Intravenous Continuous Kristeen Miss, MD      . lisinopril (ZESTRIL) tablet 40 mg  40 mg Oral Daily Kristeen Miss, MD   40 mg at 11/18/18 1547  . LORazepam (ATIVAN) tablet 1 mg  1 mg Oral QHS Kristeen Miss, MD   1 mg at 11/18/18 2144  . menthol-cetylpyridinium (CEPACOL) lozenge 3 mg  1 lozenge Oral PRN Kristeen Miss, MD       Or  . phenol (CHLORASEPTIC) mouth spray 1 spray  1 spray Mouth/Throat PRN Kristeen Miss, MD      . methocarbamol (ROBAXIN) tablet 500 mg  500 mg Oral Q6H PRN Kristeen Miss, MD   500 mg at 11/19/18 0501   Or  . methocarbamol (ROBAXIN) 500 mg in dextrose 5 % 50 mL IVPB  500 mg Intravenous Q6H PRN Kristeen Miss, MD      . metoprolol tartrate (LOPRESSOR) tablet 50 mg  50 mg Oral BID Kristeen Miss, MD   50 mg at 11/18/18 2006  . mometasone-formoterol (DULERA) 100-5 MCG/ACT inhaler 2 puff  2 puff Inhalation BID Kristeen Miss, MD   2 puff at 11/19/18 0849  . morphine 2 MG/ML injection 2 mg  2 mg Intravenous Q2H PRN Kristeen Miss, MD      . nitroGLYCERIN (NITROSTAT) SL tablet 0.4 mg  0.4 mg Sublingual Q5 min PRN Kristeen Miss, MD      . ondansetron (ZOFRAN) tablet 4 mg  4 mg Oral Q6H PRN Kristeen Miss, MD       Or  . ondansetron (ZOFRAN) injection 4 mg  4 mg Intravenous Q6H PRN Kristeen Miss, MD      . oxyCODONE (Oxy IR/ROXICODONE) immediate release tablet 10 mg  10 mg Oral Q3H PRN Kristeen Miss, MD   10 mg at 11/18/18 2006  . polyethylene glycol (MIRALAX / GLYCOLAX) packet 17 g  17 g Oral Daily PRN Kristeen Miss, MD      . polyvinyl alcohol (LIQUIFILM TEARS) 1.4 % ophthalmic solution 1 drop  1 drop Both Eyes PRN Kristeen Miss, MD      . senna (SENOKOT) tablet 8.6 mg  1 tablet Oral BID Kristeen Miss, MD   8.6 mg at 11/18/18 2006  . sodium chloride flush (NS) 0.9 % injection 3 mL  3 mL Intravenous Q12H Kristeen Miss, MD      . sodium chloride flush (NS) 0.9 % injection 3 mL  3 mL Intravenous PRN Kristeen Miss, MD      . sodium phosphate (FLEET) 7-19 GM/118ML enema 1 enema  1 enema Rectal Once PRN Kristeen Miss, MD         Discharge Medications: Please see discharge summary for a list of discharge medications.  Relevant Imaging Results:  Relevant Lab Results:   Additional Information SSN: [id number]  Benard Halsted, LCSW

## 2018-11-19 NOTE — Progress Notes (Signed)
Pt doing well. Pt given D/C instructions with verbal understanding. Report was called to [facility]. Pt's Rx's were sent to pharmacy by MD. Pt's incisions are clean and dry with no sign of infection. Pt's IV was removed prior to D/C. Pt D/C'd to SNF per MD order. Pt is stable @ D/C and has no other needs at this time. Holli Humbles, RN

## 2018-11-19 NOTE — TOC Transition Note (Signed)
Transition of Care [facility]) - CM/SW Discharge Note   Patient Details  Name: NECO VISCUSI MRN: [id number] Date of Birth: [birth year]  [facility] CM/SW Contact:  Benard Halsted, LCSW Phone Number: 11/19/2018, 10:35 AM   Clinical Narrative:    Claudina Lick has received insurance approval for patient to discharge today.   Patient will DC to: [facility] Anticipated DC date: 11/19/18 Family notified: Patient alerting family Transport by: Patient's friend by car   Per MD patient ready for DC to [facility]. RN, patient, patient's family, and facility notified of DC. Discharge Summary and FL2 sent to facility. RN to call report prior to discharge [phone] Room 7006).   CSW will sign off for now as social work intervention is no longer needed. Please consult [place] again if new needs arise.  Cedric Fishman, LCSW Clinical Social Worker [phone]    Final next level of care: Skilled Nursing Facility Barriers to Discharge: No Barriers Identified   Patient Goals and CMS Choice Patient states their goals for this hospitalization and ongoing recovery are:: Rehab CMS Medicare.gov Compare Post Acute Care list provided to:: Patient Choice offered to / list presented to : Patient  Discharge Placement   Existing PASRR number confirmed : 11/19/18          Patient chooses bed at: [facility] Patient to be transferred to facility by: Car Name of family member notified: NA Patient and family notified of of transfer: 11/19/18  Discharge Plan and Services In-house Referral: Clinical Social Work Discharge Planning Services: NA Post Acute Care Choice: [place]          DME Arranged: N/A DME Agency: NA       HH Arranged: NA HH Agency: NA        Social Determinants of Health (SDOH) Interventions     Readmission Risk Interventions No flowsheet data found.

## 2019-02-26 ENCOUNTER — Encounter

## 2019-04-09 ENCOUNTER — Other Ambulatory Visit: Payer: Self-pay | Admitting: Hematology and Oncology

## 2019-04-09 DIAGNOSIS — N6310 Unspecified lump in the right breast, unspecified quadrant: Secondary | ICD-10-CM

## 2019-05-04 IMAGING — PT NM PET TUM IMG INITIAL (PI) SKULL BASE T - THIGH
1 of 8 series · 1 of 25 positions shown · non-contrast
Comparison: CT chest dated 05/22/2016.

CLINICAL DATA: Initial treatment strategy for pulmonary nodule.

EXAM:
NUCLEAR MEDICINE PET SKULL BASE TO THIGH
TECHNIQUE: 9.6 mCi F-18 FDG was injected intravenously. Full-ring PET imaging
was performed from the skull base to thigh after the radiotracer. CT
data was obtained and used for attenuation correction and anatomic
localization.
FASTING BLOOD GLUCOSE:  Value: 110 mg/dl

[Series 3: pet sk_thigh ac · axial · 5.0mm · 4.07mm/px · 1 of 213 slices shown]
[im 142/213]
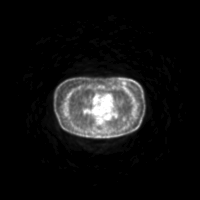

[1 of 25 positions shown; findings below may reference images not displayed]

Virtual colonoscopy dated
03/19/2007. [DATE] x 10 mm on 3116 virtual colonoscopy.
FINDINGS: NECK

No hypermetabolic lymph nodes in the neck.

CHEST

No hypermetabolic mediastinal or hilar nodes.

Macrolobulated 16 x 15 mm left lower lobe pulmonary nodule (series
8/image 48), max SUV 1.8. This previously measured 11 x 10 mm on
3116 virtual colonoscopy. The extremely slow growth rate and
relative lack of hypermetabolism for lesion size is compatible with
a benign nodule such as hamartoma.

No suspicious pulmonary nodules.

The heart is normal in size. Three vessel coronary atherosclerosis.
Atherosclerotic calcifications of the aortic arch.

Postsurgical changes in the medial left breast (series 4/image 32).
Status post left axillary lymph node dissection.

ABDOMEN/PELVIS

No abnormal hypermetabolic activity within the liver, pancreas,
adrenal glands, or spleen.

No hypermetabolic lymph nodes in the abdomen or pelvis.

Atherosclerotic calcifications the abdominal aorta and branch
vessels. Extensive sigmoid diverticulosis, without evidence of
diverticulitis.

SKELETON

No focal hypermetabolic activity to suggest skeletal metastasis.

Degenerative changes of the visualized thoracolumbar spine.
Postsurgical changes involving the lumbar spine.
IMPRESSION: 16 x 15 mm left lower lobe pulmonary nodule, exhibiting only slow
growth when compared 3116, and with relative lack of
hypermetabolism, compatible with a benign etiology such as a
pulmonary hamartoma.

This nodule is not considered suspicious for primary bronchogenic
neoplasm or metastasis. Dedicated follow-up imaging is not required.

## 2019-05-05 ENCOUNTER — Other Ambulatory Visit: Payer: Self-pay

## 2019-05-05 ENCOUNTER — Ambulatory Visit
Admission: RE | Admit: 2019-05-05 | Discharge: 2019-05-05 | Disposition: A | Discharge status: Home / Self Care | Payer: Medicare Other | Source: Ambulatory Visit | Attending: Hematology and Oncology | Admitting: Hematology and Oncology

## 2019-05-05 ENCOUNTER — Other Ambulatory Visit: Payer: Self-pay | Admitting: Hematology and Oncology

## 2019-05-05 DIAGNOSIS — N6310 Unspecified lump in the right breast, unspecified quadrant: Secondary | ICD-10-CM

## 2019-05-27 ENCOUNTER — Encounter (HOSPITAL_BASED_OUTPATIENT_CLINIC_OR_DEPARTMENT_OTHER): Payer: Self-pay | Admitting: *Deleted

## 2019-05-27 ENCOUNTER — Emergency Department (HOSPITAL_BASED_OUTPATIENT_CLINIC_OR_DEPARTMENT_OTHER): Payer: Medicare Other

## 2019-05-27 ENCOUNTER — Inpatient Hospital Stay (HOSPITAL_BASED_OUTPATIENT_CLINIC_OR_DEPARTMENT_OTHER)
Admission: EM | Admit: 2019-05-27 | Discharge: 2019-05-31 | DRG: 419 | Disposition: A | Discharge status: Home / Self Care | Payer: Medicare Other | Attending: Internal Medicine | Admitting: Internal Medicine

## 2019-05-27 ENCOUNTER — Other Ambulatory Visit: Payer: Self-pay

## 2019-05-27 DIAGNOSIS — Z9851 Tubal ligation status: Secondary | ICD-10-CM

## 2019-05-27 DIAGNOSIS — I10 Essential (primary) hypertension: Secondary | ICD-10-CM | POA: Diagnosis present

## 2019-05-27 DIAGNOSIS — Z981 Arthrodesis status: Secondary | ICD-10-CM

## 2019-05-27 DIAGNOSIS — M545 Low back pain: Secondary | ICD-10-CM | POA: Diagnosis present

## 2019-05-27 DIAGNOSIS — I2510 Atherosclerotic heart disease of native coronary artery without angina pectoris: Secondary | ICD-10-CM | POA: Diagnosis present

## 2019-05-27 DIAGNOSIS — K8063 Calculus of gallbladder and bile duct with acute cholecystitis with obstruction: Secondary | ICD-10-CM | POA: Diagnosis present

## 2019-05-27 DIAGNOSIS — Z853 Personal history of malignant neoplasm of breast: Secondary | ICD-10-CM | POA: Diagnosis not present

## 2019-05-27 DIAGNOSIS — M1990 Unspecified osteoarthritis, unspecified site: Secondary | ICD-10-CM | POA: Diagnosis present

## 2019-05-27 DIAGNOSIS — Z955 Presence of coronary angioplasty implant and graft: Secondary | ICD-10-CM | POA: Diagnosis not present

## 2019-05-27 DIAGNOSIS — J449 Chronic obstructive pulmonary disease, unspecified: Secondary | ICD-10-CM | POA: Diagnosis present

## 2019-05-27 DIAGNOSIS — Z823 Family history of stroke: Secondary | ICD-10-CM

## 2019-05-27 DIAGNOSIS — Z20822 Contact with and (suspected) exposure to covid-19: Secondary | ICD-10-CM | POA: Diagnosis present

## 2019-05-27 DIAGNOSIS — L719 Rosacea, unspecified: Secondary | ICD-10-CM | POA: Diagnosis present

## 2019-05-27 DIAGNOSIS — I252 Old myocardial infarction: Secondary | ICD-10-CM | POA: Diagnosis not present

## 2019-05-27 DIAGNOSIS — Z7982 Long term (current) use of aspirin: Secondary | ICD-10-CM

## 2019-05-27 DIAGNOSIS — Z880 Allergy status to penicillin: Secondary | ICD-10-CM | POA: Diagnosis not present

## 2019-05-27 DIAGNOSIS — K810 Acute cholecystitis: Secondary | ICD-10-CM | POA: Diagnosis not present

## 2019-05-27 DIAGNOSIS — Z923 Personal history of irradiation: Secondary | ICD-10-CM

## 2019-05-27 DIAGNOSIS — Z79899 Other long term (current) drug therapy: Secondary | ICD-10-CM

## 2019-05-27 DIAGNOSIS — E7800 Pure hypercholesterolemia, unspecified: Secondary | ICD-10-CM | POA: Diagnosis not present

## 2019-05-27 DIAGNOSIS — Z87891 Personal history of nicotine dependence: Secondary | ICD-10-CM | POA: Diagnosis not present

## 2019-05-27 DIAGNOSIS — F419 Anxiety disorder, unspecified: Secondary | ICD-10-CM | POA: Diagnosis present

## 2019-05-27 DIAGNOSIS — R911 Solitary pulmonary nodule: Secondary | ICD-10-CM | POA: Diagnosis present

## 2019-05-27 DIAGNOSIS — R112 Nausea with vomiting, unspecified: Secondary | ICD-10-CM | POA: Diagnosis not present

## 2019-05-27 DIAGNOSIS — R109 Unspecified abdominal pain: Secondary | ICD-10-CM | POA: Diagnosis present

## 2019-05-27 DIAGNOSIS — Z8674 Personal history of sudden cardiac arrest: Secondary | ICD-10-CM | POA: Diagnosis not present

## 2019-05-27 DIAGNOSIS — K8050 Calculus of bile duct without cholangitis or cholecystitis without obstruction: Secondary | ICD-10-CM

## 2019-05-27 DIAGNOSIS — Z803 Family history of malignant neoplasm of breast: Secondary | ICD-10-CM | POA: Diagnosis not present

## 2019-05-27 DIAGNOSIS — K8020 Calculus of gallbladder without cholecystitis without obstruction: Secondary | ICD-10-CM

## 2019-05-27 DIAGNOSIS — Z888 Allergy status to other drugs, medicaments and biological substances status: Secondary | ICD-10-CM

## 2019-05-27 DIAGNOSIS — K8001 Calculus of gallbladder with acute cholecystitis with obstruction: Secondary | ICD-10-CM

## 2019-05-27 DIAGNOSIS — E785 Hyperlipidemia, unspecified: Secondary | ICD-10-CM | POA: Diagnosis present

## 2019-05-27 DIAGNOSIS — Z8249 Family history of ischemic heart disease and other diseases of the circulatory system: Secondary | ICD-10-CM | POA: Diagnosis not present

## 2019-05-27 DIAGNOSIS — Z800 Family history of malignant neoplasm of digestive organs: Secondary | ICD-10-CM

## 2019-05-27 DIAGNOSIS — R1011 Right upper quadrant pain: Secondary | ICD-10-CM

## 2019-05-27 LAB — CBC
HCT: 42.9 % (ref 36.0–46.0)
Hemoglobin: 14.2 g/dL (ref 12.0–15.0)
MCH: 29.3 pg (ref 26.0–34.0)
MCHC: 33.1 g/dL (ref 30.0–36.0)
MCV: 88.5 fL (ref 80.0–100.0)
Platelets: 277 10*3/uL (ref 150–400)
RBC: 4.85 MIL/uL (ref 3.87–5.11)
RDW: 13.2 % (ref 11.5–15.5)
WBC: 13.7 10*3/uL — ABNORMAL HIGH (ref 4.0–10.5)
nRBC: 0 % (ref 0.0–0.2)

## 2019-05-27 LAB — COMPREHENSIVE METABOLIC PANEL
ALT: 540 U/L — ABNORMAL HIGH (ref 0–44)
AST: 866 U/L — ABNORMAL HIGH (ref 15–41)
Albumin: 4 g/dL (ref 3.5–5.0)
Alkaline Phosphatase: 190 U/L — ABNORMAL HIGH (ref 38–126)
Anion gap: 11 (ref 5–15)
BUN: 23 mg/dL (ref 8–23)
CO2: 28 mmol/L (ref 22–32)
Calcium: 9.2 mg/dL (ref 8.9–10.3)
Chloride: 98 mmol/L (ref 98–111)
Creatinine, Ser: 0.78 mg/dL (ref 0.44–1.00)
GFR calc Af Amer: >60 mL/min (ref >60)
GFR calc non Af Amer: >60 mL/min (ref >60)
Glucose, Bld: 155 mg/dL — ABNORMAL HIGH (ref 70–99)
Potassium: 3.5 mmol/L (ref 3.5–5.1)
Sodium: 137 mmol/L (ref 135–145)
Total Bilirubin: 3 mg/dL — ABNORMAL HIGH (ref 0.3–1.2)
Total Protein: 6.8 g/dL (ref 6.5–8.1)

## 2019-05-27 LAB — URINALYSIS, ROUTINE W REFLEX MICROSCOPIC
Bilirubin Urine: NEGATIVE
Glucose, UA: NEGATIVE mg/dL
Hgb urine dipstick: NEGATIVE
Ketones, ur: NEGATIVE mg/dL
Nitrite: POSITIVE — AB
Protein, ur: NEGATIVE mg/dL
Specific Gravity, Urine: 1.02 (ref 1.005–1.030)
pH: 8 (ref 5.0–8.0)

## 2019-05-27 LAB — URINALYSIS, MICROSCOPIC (REFLEX)

## 2019-05-27 LAB — RESPIRATORY PANEL BY RT PCR (FLU A&B, COVID)
Influenza A by PCR: NEGATIVE
Influenza B by PCR: NEGATIVE
SARS Coronavirus 2 by RT PCR: NEGATIVE

## 2019-05-27 LAB — LIPASE, BLOOD: Lipase: 69 U/L — ABNORMAL HIGH (ref 11–51)

## 2019-05-27 MED ORDER — ONDANSETRON HCL 4 MG/2ML IJ SOLN: 4.0000 mg | Freq: Once | INTRAMUSCULAR | Status: AC | PRN

## 2019-05-27 MED ORDER — PROMETHAZINE HCL 25 MG/ML IJ SOLN
12.5000 mg | Freq: Once | INTRAMUSCULAR | Status: AC
  Administered 2019-05-27: 17:00:00 12.5 mg via INTRAVENOUS

## 2019-05-27 MED ORDER — SODIUM CHLORIDE 0.9 % IV SOLN
INTRAVENOUS | Status: DC
  Administered 2019-05-27: 75 mL/h via INTRAVENOUS

## 2019-05-27 MED ORDER — SODIUM CHLORIDE 0.9% FLUSH
3.0000 mL | Freq: Two times a day (BID) | INTRAVENOUS | Status: DC
  Administered 2019-05-27 – 2019-05-29 (×2): 3 mL via INTRAVENOUS

## 2019-05-27 MED ORDER — ONDANSETRON HCL 4 MG/2ML IJ SOLN
INTRAMUSCULAR | Status: AC
  Administered 2019-05-27: 17:00:00 4 mg via INTRAVENOUS
  Filled 2019-05-27: qty 2

## 2019-05-27 MED ORDER — CIPROFLOXACIN IN D5W 400 MG/200ML IV SOLN
400.0000 mg | Freq: Once | INTRAVENOUS | Status: AC
  Administered 2019-05-27: 18:00:00 400 mg via INTRAVENOUS
  Filled 2019-05-27: qty 200

## 2019-05-27 MED ORDER — LORAZEPAM 2 MG/ML IJ SOLN
0.5000 mg | Freq: Every evening | INTRAMUSCULAR | Status: DC | PRN
  Administered 2019-05-27 – 2019-05-30 (×4): 0.5 mg via INTRAVENOUS
  Filled 2019-05-27 (×4): qty 1

## 2019-05-27 MED ORDER — BUDESONIDE-FORMOTEROL FUMARATE 80-4.5 MCG/ACT IN AERO
2.0000 | INHALATION_SPRAY | Freq: Two times a day (BID) | RESPIRATORY_TRACT | Status: DC
  Administered 2019-05-28 – 2019-05-31 (×7): 2 via RESPIRATORY_TRACT
  Filled 2019-05-27: qty 6.9

## 2019-05-27 MED ORDER — METRONIDAZOLE IN NACL 5-0.79 MG/ML-% IV SOLN
500.0000 mg | Freq: Once | INTRAVENOUS | Status: AC
  Administered 2019-05-27: 19:00:00 500 mg via INTRAVENOUS
  Filled 2019-05-27: qty 100

## 2019-05-27 MED ORDER — SODIUM CHLORIDE 0.9 % IV SOLN: INTRAVENOUS | Status: DC

## 2019-05-27 MED ORDER — PROMETHAZINE HCL 25 MG/ML IJ SOLN
INTRAMUSCULAR | Status: AC
  Filled 2019-05-27: qty 1

## 2019-05-27 MED ORDER — FAMOTIDINE IN NACL 20-0.9 MG/50ML-% IV SOLN
20.0000 mg | Freq: Once | INTRAVENOUS | Status: AC
  Administered 2019-05-27: 17:00:00 20 mg via INTRAVENOUS
  Filled 2019-05-27: qty 50

## 2019-05-27 MED ORDER — SODIUM CHLORIDE 0.9 % IV SOLN: INTRAVENOUS | Status: DC | PRN

## 2019-05-27 NOTE — ED Provider Notes (Signed)
[facility] EMERGENCY DEPARTMENT Provider Note   CSN: [id number] Arrival date & time: 05/27/19  1549     History Chief Complaint  Patient presents with  . Abdominal Pain    Rachel Chase is a 79 y.o. female.  HPI   79 year old female with abdominal pain.  Epigastric.  Intermittent over the past 6 to 8 months.  She feels like it is progressively worsened in the past few days to weeks.  It is consistently worse postprandially.  Symptoms associated with nausea and vomiting.  Today she has also noticed pain straight through to her back.  No fevers or chills.  No urinary complaints.  No diarrhea.  Denies any past history abdominal surgery.  Past Medical History:  Diagnosis Date  . Anxiety   . Arthritis   . Back pain    Lower Back pain - takes Cortisone shots  . Bone spur 2009   lower back  . Breast cancer ([place]) 2015   finished radiation 12/15  . Bronchitis   . CAD (coronary artery disease)    a. cath 12/2010 for cardiac arrest showed   . Cataract   . Complication of anesthesia    throat irrition from past intubations, intubation granulomas  . Coronary artery disease   . Dyspnea on exertion   . History of cardiac arrest 12/2010  . Hyperlipemia   . Hypertension   . MI (myocardial infarction) ([place]) 12/2010   Was followed by Dr. Pernell Dupre  . Personal history of radiation therapy   . 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 Radiation 01/06/14-02/04/14   left breast   . Rosacea 2008   both eyes    Patient Active Problem List   Diagnosis Date Noted  . Lumbar stenosis with neurogenic claudication 11/18/2018  . CAD (coronary artery disease), native coronary artery 10/04/2016  . Lung nodule, solitary 06/12/2016  . Hyperlipemia 11/16/2013  . Primary cancer of lower-inner quadrant of left female breast ([place]) 10/21/2013  . 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 ANTERIOR LAT LUMBAR FUSION N/A 11/18/2018   Procedure: Lumbar One-Two Anterolateral lumbar decompression/fusion/lateral plate fixation;  Surgeon: Kristeen Miss, MD;  Location: [facility];  Service: Neurosurgery;  Laterality: N/A;  Lumbar One-Two Anterolateral lumbar decompression/fusion/lateral plate fixation  . BACK SURGERY    . BREAST BIOPSY  8/10   fibrocystic changes  . BREAST LUMPECTOMY Left 2015  . BREAST LUMPECTOMY WITH NEEDLE LOCALIZATION AND AXILLARY SENTINEL LYMPH NODE BX Left 11/13/2013   Procedure: NEEDLE LOCALIZATION LEFT BREAST LUMPECTOMY WITH LEFT  AXILLARY SENTINEL LYMPH NODE BX;  Surgeon: Excell Seltzer, MD;  Location: [facility];  Service: General;  Laterality: Left;  . CATARACT EXTRACTION     both  . [facility] approx 2007  . heart stent     failed-no stents  . HYSTEROSCOPY  9/10   D&C (polyp)  . LEFT HEART CATHETERIZATION WITH CORONARY ANGIOGRAM N/A 01/18/2011   Procedure: LEFT HEART CATHETERIZATION WITH CORONARY ANGIOGRAM;  Surgeon: Clent Demark, MD;  Location: [facility] CATH LAB;  Service: Cardiovascular;  Laterality: N/A;  . PERCUTANEOUS CORONARY STENT INTERVENTION (PCI-S) N/A 01/18/2011   Procedure: PERCUTANEOUS CORONARY STENT INTERVENTION (PCI-S);  Surgeon: Clent Demark, MD;  Location: [facility] CATH LAB;  Service: Cardiovascular;  Laterality: N/A;  . TONSILLECTOMY AND ADENOIDECTOMY    .  TUBAL LIGATION       OB History    Gravida  2   Para  2   Term  2   Preterm      AB      Living  2     SAB      TAB      Ectopic      Multiple      Live Births              Family History  Problem Relation Age of Onset  . Congestive Heart Failure Father   . CVA Father        MI, CAD  . Coronary artery disease Father   . Valvular heart disease Mother   . Hypertension Mother   . Rectal cancer Sister   . Breast cancer Sister   . Breast cancer Maternal Grandmother 90  .  Breast cancer Maternal Aunt   . Cancer Sister     Social History   Tobacco Use  . Smoking status: Former Smoker    Packs/day: 1.50    Years: 45.00    Pack years: 67.50    Types: Cigarettes    Quit date: 04/17/1998    Years since quitting: 21.1  . Smokeless tobacco: Never Used  . Tobacco comment: nicorette gum  Substance Use Topics  . Alcohol use: Yes    Alcohol/week: 1.0 - 2.0 standard drinks    Types: 1 - 2 Standard drinks or equivalent per week    Comment: occ  . Drug use: No    Home Medications Prior to Admission medications   Medication Sig Start Date End Date Taking? Authorizing Provider  albuterol (PROVENTIL HFA;VENTOLIN HFA) 108 (90 BASE) MCG/ACT inhaler Inhale 2 puffs into the lungs daily as needed for wheezing or shortness of breath.   Yes [provider]  aspirin EC 81 MG tablet Take 81 mg by mouth daily with supper.   Yes [provider]  atorvastatin (LIPITOR) 40 MG tablet Take 40 mg by mouth at bedtime.  10/08/12  Yes [provider]  Budesonide-Formoterol Fumarate (SYMBICORT IN) Inhale 2 puffs into the lungs 2 (two) times daily.   Yes [provider]  hydrochlorothiazide (HYDRODIURIL) 25 MG tablet Take 25 mg by mouth daily.  10/05/13  Yes [provider]  lisinopril (PRINIVIL,ZESTRIL) 40 MG tablet Take 40 mg by mouth daily.  10/20/13  Yes [provider]  LORazepam (ATIVAN) 1 MG tablet Take 1 mg by mouth at bedtime.   Yes [provider]  metoprolol (LOPRESSOR) 50 MG tablet Take 50 mg by mouth 2 (two) times daily.   Yes [provider]  Artificial Tear Solution (SOOTHE HYDRATION OP) Place 1 drop into both eyes daily as needed (dry eyes).    [provider]  NITROSTAT 0.4 MG SL tablet DISSOLVE ONE TABLET UNDER THE TONGUE AS NEEDED FOR CHEST PAIN AS DIRECTED 03/15/17   Belva Crome, MD  NONFORMULARY OR COMPOUNDED ITEM Vit E vaginal suppositories 200mg /ml.  One pv three times weekly. 09/05/18    Megan Salon, MD  Vitamin D, Ergocalciferol, (DRISDOL) 1.25 MG (50000 UT) CAPS capsule TAKE ONE CAPSULE (50 000 UNITS) BY MOUTHEVERY 14 DAYS Patient taking differently: every 7 (seven) days. TAKE ONE CAPSULE (50 000 UNITS) BY MOUTHEVERY 14 DAYS 09/05/18   Megan Salon, MD    Allergies    Dilaudid [hydromorphone hcl] and Penicillins  Review of Systems   Review of Systems All systems reviewed  and negative, other than as noted in HPI.  Physical Exam Updated Vital Signs BP (!) 119/49 (BP Location: Right Arm) Comment: second BP taken and RN Marva informed  Pulse 85   Temp 97.7 F (36.5 C) (Oral)   Resp 20   Ht 5\' 8"  (1.727 m)   Wt 81.2 kg   LMP 02/27/2008   SpO2 98%   BMI 27.23 kg/m   Physical Exam Vitals and nursing note reviewed.  Constitutional:      Appearance: She is well-developed.     Comments: Lying in the room she was leaning on her side actively retching.  Small amount of watery emesis.  HENT:     Head: Normocephalic and atraumatic.  Eyes:     General:        Right eye: No discharge.        Left eye: No discharge.     Conjunctiva/sclera: Conjunctivae normal.  Cardiovascular:     Rate and Rhythm: Normal rate and regular rhythm.     Heart sounds: Normal heart sounds. No murmur. No friction rub. No gallop.   Pulmonary:     Effort: Pulmonary effort is normal. No respiratory distress.     Breath sounds: Normal breath sounds.  Abdominal:     General: There is no distension.     Palpations: Abdomen is soft.     Tenderness: There is abdominal tenderness in the epigastric area.     Comments: Tenderness in epigastric region without rebound or guarding.  No distention.  Musculoskeletal:        General: No tenderness.     Cervical back: Neck supple.  Skin:    General: Skin is warm and dry.  Neurological:     Mental Status: She is alert.  Psychiatric:        Behavior: Behavior normal.        Thought Content: Thought content normal.     ED Results / Procedures  / Treatments   Labs (all labs ordered are listed, but only abnormal results are displayed) Labs Reviewed  LIPASE, BLOOD - Abnormal; Notable for the following components:      Result Value   Lipase 69 (*)    All other components within normal limits  COMPREHENSIVE METABOLIC PANEL - Abnormal; Notable for the following components:   Glucose, Bld 155 (*)    AST 866 (*)    ALT 540 (*)    Alkaline Phosphatase 190 (*)    Total Bilirubin 3.0 (*)    All other components within normal limits  CBC - Abnormal; Notable for the following components:   WBC 13.7 (*)    All other components within normal limits  URINALYSIS, ROUTINE W REFLEX MICROSCOPIC - Abnormal; Notable for the following components:   Nitrite POSITIVE (*)    Leukocytes,Ua TRACE (*)    All other components within normal limits  URINALYSIS, MICROSCOPIC (REFLEX) - Abnormal; Notable for the following components:   Bacteria, UA MANY (*)    All other components within normal limits  URINE CULTURE  RESPIRATORY PANEL BY RT PCR (FLU A&B, COVID)    EKG None  Radiology US Abdomen Limited RUQ  Result Date: 05/27/2019 CLINICAL DATA:  Elevated LFTs, total bilirubin, and lipase. Elevated white blood cell count EXAM: ULTRASOUND ABDOMEN LIMITED RIGHT UPPER QUADRANT COMPARISON:  None. FINDINGS: Gallbladder: Multiple shadowing gallstones are identified measuring up to 5 mm in size. Gallbladder wall thickening measuring up to 7 mm. There is a negative sonographic Murphy sign. Common bile  duct: Diameter: 12 mm Liver: No focal lesion identified. Within normal limits in parenchymal echogenicity. Portal vein is patent on color Doppler imaging with normal direction of blood flow towards the liver. Other: None. IMPRESSION: 1. Multiple small gallstones, with gallbladder wall thickening compatible with acute cholecystitis. 2. Dilated common bile duct.  No evidence of choledocholithiasis. Electronically Signed   By: Randa Ngo M.D.   On: 05/27/2019 18:30     Procedures Procedures (including critical care time)  Medications Ordered in ED Medications  0.9 %  sodium chloride infusion ( Intravenous New Bag/Given 05/27/19 1636)  0.9 %  sodium chloride infusion (has no administration in time range)  famotidine (PEPCID) IVPB 20 mg premix (has no administration in time range)  promethazine (PHENERGAN) injection 12.5 mg (has no administration in time range)  ondansetron (ZOFRAN) injection 4 mg (4 mg Intravenous Given 05/27/19 1636)    ED Course  I have reviewed the triage vital signs and the nursing notes.  Pertinent labs & imaging results that were available during my care of the patient were reviewed by me and considered in my medical decision making (see chart for details).    MDM Rules/Calculators/A&P                      79 year old female with what I suspect is symptomatic cholelithiasis.  Consider peptic ulcer disease, pancreatitis but I feel less likely.  Much less likely atypical ACS, AAA etc.  We will treat her symptoms.  Labs, urinalysis we will start of an ultrasound.  May end up getting CT if nondiagnostic.  Imaging significant for calculus cholecystitis.  LFTs are up a little bit with a T bili of three.  Discussed with Dr. Redmond Pulling, general surgery.  Requesting medical admission.  We will see her in consultation.  We will need to trend LFTs and possible GI consultation if they increase.  Antibiotics already ordered.  She had an incidentally appears to also have a UTI.  Patient updated on plan.  Routine Covid testing.  Final Clinical Impression(s) / ED Diagnoses Final diagnoses:  Calculus of gallbladder with acute cholecystitis and obstruction    Rx / DC Orders ED Discharge Orders    None       Virgel Manifold, MD 05/27/19 1918

## 2019-05-27 NOTE — ED Triage Notes (Signed)
Gastrointestinal problem for 6 months and the pain is getting worst.  C/o nausea and vomiting x 3.  Denies fever and diarrhea.

## 2019-05-27 NOTE — ED Notes (Signed)
ED Provider at bedside. PT with large emesis. Vss.

## 2019-05-27 NOTE — Progress Notes (Signed)
Patient arrived from [place] and oriented X4, ambulatory, admitted with cholecystitis and UTI.

## 2019-05-27 NOTE — ED Notes (Signed)
ED Provider at bedside. 

## 2019-05-27 NOTE — H&P (Signed)
History and Physical    PLEASE NOTE THAT DRAGON DICTATION SOFTWARE WAS USED IN THE CONSTRUCTION OF THIS NOTE.   Rachel Chase:[id number] DOB: 02/25/1941 DOA: 05/27/2019  PCP: Burnard Bunting, MD Patient coming from: home   I have personally briefly reviewed patient's old medical records in [place]  Chief Complaint: Nausea vomiting  HPI: Rachel Chase is a 79 y.o. female with medical history significant for coronary disease status post PCI stent placement in 2012, hypertension, hyperlipidemia, anxiety, who is admitted to [facility] on 05/27/2019 as transfer from [place] ED for further evaluation and management of acute cholecystitis after presenting from home to the latter facility complaining of nausea and vomiting.   The patient reports that she has been experiencing approximately 6 months of intermittent right upper quadrant discomfort associated with exacerbation in a postprandial timeframe.  She acknowledges the potential slight increase in the associated intensity of the discomfort, but conveys that it was the development of new onset nausea/vomiting over the last 3 to 4 days that prompted her to present to [place] emergency department for further evaluation.  Specifically, she reports approximately 6-8 episodes of nonbloody, nonbilious emesis over the last 3 to 4 days, with most recent such episode of emesis occurring just prior to presentation in [facility] ED. denies any recent trauma or travel.  Denies any associated diarrhea, melena, or hematochezia.  Denies any subjective fever, chills, rigors, or generalized myalgias. Denies any recent headache, neck stiffness, rhinitis, rhinorrhea, sore throat, sob, cough, or rash. No recent traveling or known COVID-19 exposures. Denies dysuria, gross hematuria, or change in urinary urgency/frequency.  Denies any recent chest pain, diaphoresis, or palpitations.  Vital signs in [facility] ED were notable  for the following: Tmax 98.3; initial heart rate 106, which decreased to 85 following initiation of IV fluids, as further described below; blood pressure 112/85 - 134/99; respiratory rate 14-20; oxygen saturation 95 to 98% on room air  Labs were notable for CMP notable for alkaline phosphatase 190, AST 866, ALT 540, total bilirubin 3.0.  CBC notable for with a sodium of 13,700, hemoglobin 14.2, platelets 277.  Nasopharyngeal COVID-19 PCR obtained in [facility] emergency department, and found to be negative.   Imaging performed at [facility] ED was notable for right upper quadrant ultrasound, which showed multiple small gallstones within the gallbladder with gallbladder wall thickening consistent with acute cholecystitis in the absence of any associated pericholecystic fluid; ultrasound also showed common bile duct dilated to 12 mm in the absence of any evidence of choledocholithiasis.  [facility] ED physician, Dr. Wilson Singer, discussed the patient's case and imaging with the on-call general surgeon, Dr. Redmond Pulling, who recommended admission to the hospitalist service at [facility] long, with Dr. Redmond Pulling planning to formally consult on patient in the morning (05/28/19). Dr. Redmond Pulling did not feel that urgent overnight surgical intervention or GI involvement was indicated at this time, but rather recommended repeating liver enzymes in the morning, with consideration to consult gastroenterology for [facility] vs ESRP in the morning if liver enzymes show interval increase.  Medications administered prior to transfer included the following: Ciprofloxacin 400 mg IV x1, metronidazole 500 mg IV x1, and continuous normal saline 150 cc/hr. Subsequently, the patient was transferred to [facility] for further work-up and management of acute cholecystitis, as above.     Review of Systems: As per HPI otherwise 10 point review of systems negative.   Past Medical History:  Diagnosis Date  .  Anxiety   . Arthritis   . Back pain     Lower Back pain - takes Cortisone shots  . Bone spur 2009   lower back  . Breast cancer ([place]) 2015   finished radiation 12/15  . Bronchitis   . CAD (coronary artery disease)    a. cath 12/2010 for cardiac arrest showed   . Cataract   . Complication of anesthesia    throat irrition from past intubations, intubation granulomas  . Coronary artery disease   . Dyspnea on exertion   . History of cardiac arrest 12/2010  . Hyperlipemia   . Hypertension   . MI (myocardial infarction) ([place]) 12/2010   Was followed by Dr. Pernell Dupre  . Personal history of radiation therapy   . 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 Radiation 01/06/14-02/04/14   left breast   . Rosacea 2008   both eyes    Past Surgical History:  Procedure Laterality Date  . ANTERIOR LAT LUMBAR FUSION N/A 11/18/2018   Procedure: Lumbar One-Two Anterolateral lumbar decompression/fusion/lateral plate fixation;  Surgeon: Kristeen Miss, MD;  Location: [place];  Service: Neurosurgery;  Laterality: N/A;  Lumbar One-Two Anterolateral lumbar decompression/fusion/lateral plate fixation  . BACK SURGERY    . BREAST BIOPSY  8/10   fibrocystic changes  . BREAST LUMPECTOMY Left 2015  . BREAST LUMPECTOMY WITH NEEDLE LOCALIZATION AND AXILLARY SENTINEL LYMPH NODE BX Left 11/13/2013   Procedure: NEEDLE LOCALIZATION LEFT BREAST LUMPECTOMY WITH LEFT  AXILLARY SENTINEL LYMPH NODE BX;  Surgeon: Excell Seltzer, MD;  Location: [place];  Service: General;  Laterality: Left;  . CATARACT EXTRACTION     both  . [facility] approx 2007  . heart stent     failed-no stents  . HYSTEROSCOPY  9/10   D&C (polyp)  . LEFT HEART CATHETERIZATION WITH CORONARY ANGIOGRAM N/A 01/18/2011   Procedure: LEFT HEART CATHETERIZATION WITH CORONARY ANGIOGRAM;  Surgeon: Clent Demark, MD;  Location: [facility] CATH LAB;  Service: Cardiovascular;  Laterality:  N/A;  . PERCUTANEOUS CORONARY STENT INTERVENTION (PCI-S) N/A 01/18/2011   Procedure: PERCUTANEOUS CORONARY STENT INTERVENTION (PCI-S);  Surgeon: Clent Demark, MD;  Location: [facility] CATH LAB;  Service: Cardiovascular;  Laterality: N/A;  . TONSILLECTOMY AND ADENOIDECTOMY    . TUBAL LIGATION      Social History:  reports that she quit smoking about 21 years ago. Her smoking use included cigarettes. She has a 67.50 pack-year smoking history. She has never used smokeless tobacco. She reports current alcohol use of about 1.0 - 2.0 standard drinks of alcohol per week. She reports that she does not use drugs.   Allergies  Allergen Reactions  . Dilaudid [Hydromorphone Hcl] Nausea And Vomiting  . Penicillins Rash    Family History  Problem Relation Age of Onset  . Congestive Heart Failure Father   . CVA Father        MI, CAD  . Coronary artery disease Father   . Valvular heart disease Mother   . Hypertension Mother   . Rectal cancer Sister   . Breast cancer Sister   . Breast cancer Maternal Grandmother 90  . Breast cancer Maternal Aunt   . Cancer Sister     Prior to Admission medications   Medication Sig Start Date End Date Taking? Authorizing Provider  albuterol (PROVENTIL HFA;VENTOLIN HFA) 108 (90 BASE) MCG/ACT inhaler Inhale  2 puffs into the lungs daily as needed for wheezing or shortness of breath.   Yes [provider]  aspirin EC 81 MG tablet Take 81 mg by mouth daily with supper.   Yes [provider]  atorvastatin (LIPITOR) 40 MG tablet Take 40 mg by mouth at bedtime.  10/08/12  Yes [provider]  Budesonide-Formoterol Fumarate (SYMBICORT IN) Inhale 2 puffs into the lungs 2 (two) times daily.   Yes [provider]  hydrochlorothiazide (HYDRODIURIL) 25 MG tablet Take 25 mg by mouth daily.  10/05/13  Yes [provider]  lisinopril (PRINIVIL,ZESTRIL) 40 MG tablet Take 40 mg by mouth daily.  10/20/13  Yes [provider]    LORazepam (ATIVAN) 1 MG tablet Take 1 mg by mouth at bedtime.   Yes [provider]  metoprolol (LOPRESSOR) 50 MG tablet Take 50 mg by mouth 2 (two) times daily.   Yes [provider]  Artificial Tear Solution (SOOTHE HYDRATION OP) Place 1 drop into both eyes daily as needed (dry eyes).    [provider]  NITROSTAT 0.4 MG SL tablet DISSOLVE ONE TABLET UNDER THE TONGUE AS NEEDED FOR CHEST PAIN AS DIRECTED 03/15/17   Belva Crome, MD  NONFORMULARY OR COMPOUNDED ITEM Vit E vaginal suppositories '200mg'$ /ml.  One pv three times weekly. 09/05/18   Megan Salon, MD  Vitamin D, Ergocalciferol, (DRISDOL) 1.25 MG (50000 UT) CAPS capsule TAKE ONE CAPSULE (50 000 UNITS) BY MOUTHEVERY 14 DAYS Patient taking differently: every 7 (seven) days. TAKE ONE CAPSULE (50 000 UNITS) BY MOUTHEVERY 14 DAYS 09/05/18   Megan Salon, MD     Objective    Physical Exam: Vitals:   05/27/19 1800 05/27/19 1830 05/27/19 1900 05/27/19 2025  BP: (!) 127/58 105/74 127/83 137/65  Pulse:  96 97 (!) 102  Resp: (!) '24 16 16 16  '$ Temp:   98.3 F (36.8 C)   TempSrc:      SpO2: 98% 98% 99% 97%  Weight:      Height:        General: appears to be stated age; alert, oriented Skin: warm, dry, no rash Head:  AT/[place] Eyes:  PEARL b/l, EOMI Mouth:  Oral mucosa membranes appear moist, normal dentition Neck: supple; trachea midline Heart:  RRR; did not appreciate any M/R/G Lungs: CTAB, did not appreciate any wheezes, rales, or rhonchi Abdomen: + BS; soft, ND; mild tenderness with palpation over RUQ; negative Murphy sign; not associated with any guarding, rigidity, or rebound tenderness. Vascular: 2+ pedal pulses b/l; 2+ radial pulses b/l Extremities: no peripheral edema, no muscle wasting Neuro: strength and sensation intact in upper and lower extremities b/l   Labs on Admission: I have personally reviewed following labs and imaging studies  CBC: Recent Labs  Lab 05/27/19 1622  WBC 13.7*  HGB  14.2  HCT 42.9  MCV 88.5  PLT 742   Basic Metabolic Panel: Recent Labs  Lab 05/27/19 1622  NA 137  K 3.5  CL 98  CO2 28  GLUCOSE 155*  BUN 23  CREATININE 0.78  CALCIUM 9.2   GFR: Estimated Creatinine Clearance: 63.7 mL/min (by C-G formula based on SCr of 0.78 mg/dL). Liver Function Tests: Recent Labs  Lab 05/27/19 1622  AST 866*  ALT 540*  ALKPHOS 190*  BILITOT 3.0*  PROT 6.8  ALBUMIN 4.0   Recent Labs  Lab 05/27/19 1622  LIPASE 69*   No results for input(s): AMMONIA in the last 168 hours. Coagulation  Profile: No results for input(s): INR, PROTIME in the last 168 hours. Cardiac Enzymes: No results for input(s): CKTOTAL, CKMB, CKMBINDEX, TROPONINI in the last 168 hours. BNP (last 3 results) No results for input(s): PROBNP in the last 8760 hours. HbA1C: No results for input(s): HGBA1C in the last 72 hours. CBG: No results for input(s): GLUCAP in the last 168 hours. Lipid Profile: No results for input(s): CHOL, HDL, LDLCALC, TRIG, CHOLHDL, LDLDIRECT in the last 72 hours. Thyroid Function Tests: No results for input(s): TSH, T4TOTAL, FREET4, T3FREE, THYROIDAB in the last 72 hours. Anemia Panel: No results for input(s): VITAMINB12, FOLATE, FERRITIN, TIBC, IRON, RETICCTPCT in the last 72 hours. Urine analysis:    Component Value Date/Time   COLORURINE YELLOW 05/27/2019 1600   APPEARANCEUR CLEAR 05/27/2019 1600   LABSPEC 1.020 05/27/2019 1600   PHURINE 8.0 05/27/2019 1600   GLUCOSEU NEGATIVE 05/27/2019 1600   HGBUR NEGATIVE 05/27/2019 1600   BILIRUBINUR NEGATIVE 05/27/2019 1600   BILIRUBINUR negatvie 01/25/2014 1726   KETONESUR NEGATIVE 05/27/2019 1600   PROTEINUR NEGATIVE 05/27/2019 1600   UROBILINOGEN 1.0 01/25/2014 1726   UROBILINOGEN 0.2 01/18/2011 2219   NITRITE POSITIVE (A) 05/27/2019 1600   LEUKOCYTESUR TRACE (A) 05/27/2019 1600    Radiological Exams on Admission: US Abdomen Limited RUQ  Result Date: 05/27/2019 CLINICAL DATA:  Elevated LFTs,  total bilirubin, and lipase. Elevated white blood cell count EXAM: ULTRASOUND ABDOMEN LIMITED RIGHT UPPER QUADRANT COMPARISON:  None. FINDINGS: Gallbladder: Multiple shadowing gallstones are identified measuring up to 5 mm in size. Gallbladder wall thickening measuring up to 7 mm. There is a negative sonographic Murphy sign. Common bile duct: Diameter: 12 mm Liver: No focal lesion identified. Within normal limits in parenchymal echogenicity. Portal vein is patent on color Doppler imaging with normal direction of blood flow towards the liver. Other: None. IMPRESSION: 1. Multiple small gallstones, with gallbladder wall thickening compatible with acute cholecystitis. 2. Dilated common bile duct.  No evidence of choledocholithiasis. Electronically Signed   By: Randa Ngo M.D.   On: 05/27/2019 18:30     Assessment/Plan   Rachel Chase is a 79 y.o. female with medical history significant for coronary disease status post PCI stent placement in 2012, hypertension, hyperlipidemia, anxiety, who is admitted to [facility] on 05/27/2019 as [facility] for further evaluation and management of acute cholecystitis after presenting from home to the latter facility complaining of nausea and vomiting.   Principal Problem:   Acute cholecystitis Active Problems:   Hyperlipemia   Abdominal pain   Nausea & vomiting   Hypertension   #) Acute cholecystitis: Diagnosis on the basis of 6 months of right upper quadrant discomfort with postprandial exacerbation and recent increase in the associated intensity of this discomfort, with new onset development of nausea/vomiting over the last 3 to 4 days, with evidence of transaminitis and a borderline cholestatic pattern, and right upper quadrant ultrasound showing gallbladder wall thickening consistent with acute cholecystitis as well as dilated common bile duct of 12 mm without evidence of radiopaque choledocholithiasis, raising the  possibility of a radiolucent stone in the common bile duct versus recently passed gallstone through the common bile duct versus a calculus cholecystitis.  Case and imaging were discussed with the on-call general surgeon, Dr. Redmond Pulling, who recommended admission to the hospitalist service at [facility] long, with Dr. Redmond Pulling planning to formally consult on patient in the morning (05/28/19). Dr. Redmond Pulling did not feel that urgent overnight surgical intervention or GI involvement  was indicated at this time, but rather recommended repeating liver enzymes in the morning, with consideration to consult gastroenterology for [facility] vs ESRP in the morning if liver enzymes show interval increase.  Of note, while presenting labs reflect mild leukocytosis which likely confers at least a degree of hemoconcentration due to recent nausea/vomiting, no additional SIRS criteria are met at this time for patient to be considered septic.  Specifically initial borderline tachycardia has improved with administration of IV fluids, suggestive of predominant contribution from dehydration in the setting of recent nausea/vomiting as opposed to representing a sequelae of physiologic stress from underlying infection.  Clinically, does not criteria for ascending cholangitis at this time.  Doses of IV ciprofloxacin and Flagyl administered at [facility] ED prior to transfer.  Plan: NPO.  Repeat CMP in the morning for trending of transaminases as well as evaluation for cholestatic pattern.  As such, will also check GGT as well as direct bilirubin.  Formal general surgery consultation to occur in the morning, with Dr. Redmond Pulling to evaluate patient at that time, as above.  If these labs indicate interval worsening of transaminitis in setting of a cholestatic pattern, would then likely consult gastroenterology, as above.  Repeat CBC with differential in the morning.  Check serum magnesium level.  As needed IV fentanyl.  As needed IV Zofran.  Check INR.   Reduce rate of IV fluids administered at [facility] to normal saline running at 75 cc/h.  Check blood cultures x2 now, following which will continue IV ciprofloxacin and Flagyl. SCD's.     #) Quantitatively insignificant pyuria: Presenting urinalysis reflects 11-20 white blood cells, which for a female, does not exceed threshold for consideration of significant pyuria.  Patient denies any urinary symptoms, and presenting abdominal discomfort and nausea/vomiting appears to be on the basis of suspected aforementioned acute cholecystitis.  Consequently, insufficient clinical and quantitative evidence for diagnosis of urinary tract infection at this time.  Plan: Repeat CBC with differential in the morning.      #) Essential hypertension: Outpatient and hypertensive regimen includes lisinopril, HCTZ, and Lopressor.  Systolic blood pressures have been noted to be normotensive thus far.  Plan: Given potential ensuing surgical intervention, will hold home antihypertensive medications for now, with plan for resumption of home Lopressor following any subsequent procedures in order to capitalize on mortality benefit associated with very operative beta-blocker resumption.  Close monitoring of ensuing blood pressure via routine vital signs.     #) Hyperlipidemia: On atorvastatin as an outpatient.  Plan: Given potential ensuing surgery in context of suspected acute cholecystitis, will hold home statin for now.      #) Generalized anxiety disorder: On scheduled nightly Ativan at home.  Of note, does not appear to be on a scheduled SSRI or SNRI as an outpatient.  Plan: In the setting of current n.p.o. status, I have placed an order for prn IV Ativan on a QHS basis for anxiety or insomnia.    DVT prophylaxis: SCDs Code Status: Full code Family Communication: none Disposition Plan: Per Rounding Team Consults called: Patient's case and imaging were discussed with the on-call general  surgeon, Dr. Redmond Pulling, as further described above. Admission status: Inpatient; MedSurg.    PLEASE NOTE THAT DRAGON DICTATION SOFTWARE WAS USED IN THE CONSTRUCTION OF THIS NOTE.   [facility] Pager [phone] From [place].   Otherwise, please contact night-coverage  [email or web address] Password [facility]  05/27/2019, 9:34 PM

## 2019-05-28 ENCOUNTER — Encounter (HOSPITAL_COMMUNITY): Payer: Self-pay | Admitting: Internal Medicine

## 2019-05-28 DIAGNOSIS — E7800 Pure hypercholesterolemia, unspecified: Secondary | ICD-10-CM

## 2019-05-28 DIAGNOSIS — I10 Essential (primary) hypertension: Secondary | ICD-10-CM

## 2019-05-28 DIAGNOSIS — R112 Nausea with vomiting, unspecified: Secondary | ICD-10-CM | POA: Diagnosis present

## 2019-05-28 DIAGNOSIS — K810 Acute cholecystitis: Secondary | ICD-10-CM

## 2019-05-28 DIAGNOSIS — R109 Unspecified abdominal pain: Secondary | ICD-10-CM

## 2019-05-28 LAB — CBC WITH DIFFERENTIAL/PLATELET
Abs Immature Granulocytes: 0.04 10*3/uL (ref 0.00–0.07)
Basophils Absolute: 0 10*3/uL (ref 0.0–0.1)
Basophils Relative: 0 %
Eosinophils Absolute: 0 10*3/uL (ref 0.0–0.5)
Eosinophils Relative: 0 %
HCT: 40.1 % (ref 36.0–46.0)
Hemoglobin: 13.1 g/dL (ref 12.0–15.0)
Immature Granulocytes: 0 %
Lymphocytes Relative: 8 %
Lymphs Abs: 0.9 10*3/uL (ref 0.7–4.0)
MCH: 29.1 pg (ref 26.0–34.0)
MCHC: 32.7 g/dL (ref 30.0–36.0)
MCV: 89.1 fL (ref 80.0–100.0)
Monocytes Absolute: 0.7 10*3/uL (ref 0.1–1.0)
Monocytes Relative: 7 %
Neutro Abs: 8.6 10*3/uL — ABNORMAL HIGH (ref 1.7–7.7)
Neutrophils Relative %: 85 %
Platelets: 227 10*3/uL (ref 150–400)
RBC: 4.5 MIL/uL (ref 3.87–5.11)
RDW: 13.3 % (ref 11.5–15.5)
WBC: 10.2 10*3/uL (ref 4.0–10.5)
nRBC: 0 % (ref 0.0–0.2)

## 2019-05-28 LAB — COMPREHENSIVE METABOLIC PANEL
ALT: 1279 U/L — ABNORMAL HIGH (ref 0–44)
AST: 1223 U/L — ABNORMAL HIGH (ref 15–41)
Albumin: 3.6 g/dL (ref 3.5–5.0)
Alkaline Phosphatase: 199 U/L — ABNORMAL HIGH (ref 38–126)
Anion gap: 10 (ref 5–15)
BUN: 21 mg/dL (ref 8–23)
CO2: 26 mmol/L (ref 22–32)
Calcium: 8.5 mg/dL — ABNORMAL LOW (ref 8.9–10.3)
Chloride: 101 mmol/L (ref 98–111)
Creatinine, Ser: 0.86 mg/dL (ref 0.44–1.00)
GFR calc Af Amer: >60 mL/min (ref >60)
GFR calc non Af Amer: >60 mL/min (ref >60)
Glucose, Bld: 107 mg/dL — ABNORMAL HIGH (ref 70–99)
Potassium: 3.3 mmol/L — ABNORMAL LOW (ref 3.5–5.1)
Sodium: 137 mmol/L (ref 135–145)
Total Bilirubin: 4.7 mg/dL — ABNORMAL HIGH (ref 0.3–1.2)
Total Protein: 6.2 g/dL — ABNORMAL LOW (ref 6.5–8.1)

## 2019-05-28 LAB — GAMMA GT: GGT: 320 U/L — ABNORMAL HIGH (ref 7–50)

## 2019-05-28 LAB — MAGNESIUM: Magnesium: 1.9 mg/dL (ref 1.7–2.4)

## 2019-05-28 LAB — PROTIME-INR
INR: 1 (ref 0.8–1.2)
Prothrombin Time: 13.5 seconds (ref 11.4–15.2)

## 2019-05-28 LAB — BILIRUBIN, DIRECT: Bilirubin, Direct: 2.6 mg/dL — ABNORMAL HIGH (ref 0.0–0.2)

## 2019-05-28 MED ORDER — ALBUTEROL SULFATE (2.5 MG/3ML) 0.083% IN NEBU: 3.0000 mL | INHALATION_SOLUTION | RESPIRATORY_TRACT | Status: DC | PRN

## 2019-05-28 MED ORDER — CIPROFLOXACIN IN D5W 400 MG/200ML IV SOLN
400.0000 mg | Freq: Two times a day (BID) | INTRAVENOUS | Status: DC
  Administered 2019-05-28 – 2019-05-31 (×7): 400 mg via INTRAVENOUS
  Filled 2019-05-28 (×7): qty 200

## 2019-05-28 MED ORDER — POTASSIUM CHLORIDE 10 MEQ/100ML IV SOLN
10.0000 meq | INTRAVENOUS | Status: AC
  Administered 2019-05-28 (×4): 10 meq via INTRAVENOUS
  Filled 2019-05-28: qty 100

## 2019-05-28 MED ORDER — MAGNESIUM SULFATE 2 GM/50ML IV SOLN
2.0000 g | Freq: Once | INTRAVENOUS | Status: AC
  Administered 2019-05-28: 2 g via INTRAVENOUS
  Filled 2019-05-28: qty 50

## 2019-05-28 MED ORDER — METRONIDAZOLE IN NACL 5-0.79 MG/ML-% IV SOLN
500.0000 mg | Freq: Three times a day (TID) | INTRAVENOUS | Status: DC
  Administered 2019-05-28 – 2019-05-31 (×10): 500 mg via INTRAVENOUS
  Filled 2019-05-28 (×10): qty 100

## 2019-05-28 MED ORDER — FENTANYL CITRATE (PF) 100 MCG/2ML IJ SOLN: 12.5000 ug | INTRAMUSCULAR | Status: DC | PRN

## 2019-05-28 MED ORDER — ONDANSETRON HCL 4 MG/2ML IJ SOLN: 4.0000 mg | Freq: Four times a day (QID) | INTRAMUSCULAR | Status: DC | PRN

## 2019-05-28 NOTE — Progress Notes (Signed)
PROGRESS NOTE    Rachel Chase  [id number] DOB: October 15, 1940 DOA: 05/27/2019 PCP: Burnard Bunting, MD    Brief Narrative:  Patient was admitted to the hospital with working diagnosis of acute cholecystitis to rule out choledocholithiasis  79 year old female who presented with nausea and vomiting.  She does have significant past medical history for coronary artery disease status post PCI, 2012, hypertension, dyslipidemia and anxiety.  She was transferred from [facility] ED for further management of acute cholecystitis.  Patient reported postpandrial right upper quadrant abdominal discomfort for about 6 months, worsening over the last 3 to 4 days, associated with nausea and vomiting.  On his initial physical examination blood pressure 127/58, heart rate 102, respiratory rate 24, temperature 98.3, oxygen saturation 97%, her lungs are clear to auscultation bilaterally, heart S1-S2 present rhythmic, her abdomen was soft, positive tenderness to palpation right upper quadrant, negative Murphy sign, no guarding or rigidity, no lower extremity edema. Sodium 137, potassium 3.5, chloride 98, bicarb 28, glucose 155, BUN 33, creatinine 0.78, alkaline phosphatase 190, lipase 69, AST 866, ALT 540, total bilirubins 3.0, white count 13.7, hemoglobin 14.2, hematocrit 42.9, platelets 277.  SARS COVID-19 negative, urinalysis 0-5 white cells.  Abdominal ultrasound with multiple small gallstones with gallbladder wall thickening, dilated common bile duct, no evidence of choledocholithiasis.  EKG 80 bpm, normal axis, normal intervals, sinus rhythm, no ST segment T wave changes.  Assessment & Plan:   Principal Problem:   Acute cholecystitis Active Problems:   Hyperlipemia   Abdominal pain   Nausea & vomiting   Hypertension   1. Acute cholecystitis to rule out choledocholithiasis. Patient with improvement in her pain, no nausea or vomiting, but not yet back to baseline.  AST up to 1,223 and ALT  1,279, T Bil up to 4,7. T max 100.7 F  Will need to rule out choledocholithiasis, will order MRCP. Continue supportive medical therapy with IV antibiotic therapy, IV fluids, as needed antiemetics and analgesics. Will keep npo for now.   Case discussed with surgery and GI  2. HTN. Continue blood pressure monitoring, hold on antihypertensive medications.  3. Dyslipidemia. Continue with statin therapy once po established.   4. COPD. No signs of exacerbation, continue with inhaled corticosteroids and bronchodilators.   DVT prophylaxis: enoxaparin   Code Status:  full Family Communication: no family at the bedside  Disposition Plan/ discharge barriers: patient from home, barrier for dc need for IV therapies and likely surgical intervention.    Consultants:   Surgery   GI    Antimicrobials:    Ciprofloxacin   Metronidazole.    Subjective: Patient's abdominal pain has improved, patient has been NPO, no nausea or vomiting, no chest pain or dyspnea.   Objective: Vitals:   05/28/19 0152 05/28/19 0500 05/28/19 0600 05/28/19 0821  BP: (!) 126/57  (!) 110/46   Pulse: 99  98   Resp: 14  16   Temp: (!) 101 F (38.3 C)  (!) 100.7 F (38.2 C)   TempSrc: Oral  Oral   SpO2: 97%  94% 94%  Weight:  80.1 kg    Height:        Intake/Output Summary (Last 24 hours) at 05/28/2019 0903 Last data filed at 05/28/2019 0649 Gross per 24 hour  Intake 1220.24 ml  Output 650 ml  Net 570.24 ml   Filed Weights   05/27/19 1600 05/27/19 2201 05/28/19 0500  Weight: 81.2 kg 78.4 kg 80.1 kg    Examination:   General:  Not in pain or dyspnea, deconditioned  Neurology: Awake and alert, non focal  E ENT: mild pallor, no icterus, oral mucosa moist Cardiovascular: No JVD. S1-S2 present, rhythmic, no gallops, rubs, or murmurs. No lower extremity edema. Pulmonary: positive breath sounds bilaterally, adequate air movement, no wheezing, rhonchi or rales. Gastrointestinal. Abdomen with no organomegaly,  mild tender, no rebound or guarding Skin. No rashes Musculoskeletal: no joint deformities     Data Reviewed: I have personally reviewed following labs and imaging studies  CBC: Recent Labs  Lab 05/27/19 1622 05/28/19 0432  WBC 13.7* 10.2  NEUTROABS  --  8.6*  HGB 14.2 13.1  HCT 42.9 40.1  MCV 88.5 89.1  PLT 277 [id number]   Basic Metabolic Panel: Recent Labs  Lab 05/27/19 1622 05/28/19 0432  NA 137 137  K 3.5 3.3*  CL 98 101  CO2 28 26  GLUCOSE 155* 107*  BUN 23 21  CREATININE 0.78 0.86  CALCIUM 9.2 8.5*  MG  --  1.9   GFR: Estimated Creatinine Clearance: 59 mL/min (by C-G formula based on SCr of 0.86 mg/dL). Liver Function Tests: Recent Labs  Lab 05/27/19 1622 05/28/19 0432  AST 866* 1,223*  ALT 540* 1,279*  ALKPHOS 190* 199*  BILITOT 3.0* 4.7*  PROT 6.8 6.2*  ALBUMIN 4.0 3.6   Recent Labs  Lab 05/27/19 1622  LIPASE 69*   No results for input(s): AMMONIA in the last 168 hours. Coagulation Profile: Recent Labs  Lab 05/28/19 0432  INR 1.0   Cardiac Enzymes: No results for input(s): CKTOTAL, CKMB, CKMBINDEX, TROPONINI in the last 168 hours. BNP (last 3 results) No results for input(s): PROBNP in the last 8760 hours. HbA1C: No results for input(s): HGBA1C in the last 72 hours. CBG: No results for input(s): GLUCAP in the last 168 hours. Lipid Profile: No results for input(s): CHOL, HDL, LDLCALC, TRIG, CHOLHDL, LDLDIRECT in the last 72 hours. Thyroid Function Tests: No results for input(s): TSH, T4TOTAL, FREET4, T3FREE, THYROIDAB in the last 72 hours. Anemia Panel: No results for input(s): VITAMINB12, FOLATE, FERRITIN, TIBC, IRON, RETICCTPCT in the last 72 hours.    Radiology Studies: I have reviewed all of the imaging during this hospital visit personally     Scheduled Meds: . budesonide-formoterol  2 puff Inhalation BID  . sodium chloride flush  3 mL Intravenous Q12H   Continuous Infusions: . sodium chloride 75 mL/hr (05/27/19 2256)  .  ciprofloxacin 400 mg (05/28/19 0649)  . metronidazole 500 mg (05/28/19 0501)     LOS: 1 day        Virgil Slinger Gerome Apley, MD

## 2019-05-28 NOTE — Consult Note (Addendum)
[facility] Surgery Consult Note  Rachel Chase [facility] [birth year]  [id number].    Requesting MD: Arrien  Chief Complaint/Reason for Consult: cholecystitis   HPI:  Patient is a 79 year old female who presented to [facility] yesterday with abdominal pain for 3-4 days. Pain is epigastric and radiates to back. This has been intermittent over the last year but progressively worsened over the last few days. Exacerbated by eating. Associated nausea and vomiting and subjective fever. Patient also reports some dark urine but denies other urinary symptoms. Currently pain and nausea are better. Denies chest pain, worsened SOB, diarrhea or constipation. PMH significant for CAD s/p stent in 2012, HTN, HLD, COPD, anxiety, Hx of breast cancer s/p lumpectomy and radiation. Past abdominal surgery includes laparoscopic tubal ligation. No blood thinning medications. Allergic to PCNs and intolerant of dilaudid. Patient is a previous smoker and denies alcohol or illicit drug use.   ROS: Review of Systems  Constitutional: Positive for chills and fever.  Respiratory: Positive for shortness of breath (chronic ).   Cardiovascular: Negative for chest pain.  Gastrointestinal: Positive for abdominal pain, nausea and vomiting.  Genitourinary: Negative for dysuria, frequency and urgency.       Darkened urine   All other systems reviewed and are negative.   Family History  Problem Relation Age of Onset  . Congestive Heart Failure Father   . CVA Father        MI, CAD  . Coronary artery disease Father   . Valvular heart disease Mother   . Hypertension Mother   . Rectal cancer Sister   . Breast cancer Sister   . Breast cancer Maternal Grandmother 90  . Breast cancer Maternal Aunt   . Cancer Sister     Past Medical History:  Diagnosis Date  . Anxiety   . Arthritis   . Back pain    Lower Back pain - takes Cortisone shots  . Bone spur 2009   lower back  . Breast cancer ([place]) 2015   finished radiation 12/15  .  Bronchitis   . CAD (coronary artery disease)    a. cath 12/2010 for cardiac arrest showed   . Cataract   . Complication of anesthesia    throat irrition from past intubations, intubation granulomas  . Coronary artery disease   . Dyspnea on exertion   . History of cardiac arrest 12/2010  . Hyperlipemia   . Hypertension   . MI (myocardial infarction) ([place]) 12/2010   Was followed by Dr. Pernell Dupre  . Personal history of radiation therapy   . 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 Radiation 01/06/14-02/04/14   left breast   . Rosacea 2008   both eyes    Past Surgical History:  Procedure Laterality Date  . ANTERIOR LAT LUMBAR FUSION N/A 11/18/2018   Procedure: Lumbar One-Two Anterolateral lumbar decompression/fusion/lateral plate fixation;  Surgeon: Kristeen Miss, MD;  Location: [facility];  Service: Neurosurgery;  Laterality: N/A;  Lumbar One-Two Anterolateral lumbar decompression/fusion/lateral plate fixation  . BACK SURGERY    . BREAST BIOPSY  8/10   fibrocystic changes  . BREAST LUMPECTOMY Left 2015  . BREAST LUMPECTOMY WITH NEEDLE LOCALIZATION AND AXILLARY SENTINEL LYMPH NODE BX Left 11/13/2013   Procedure: NEEDLE LOCALIZATION LEFT BREAST LUMPECTOMY WITH LEFT  AXILLARY SENTINEL LYMPH NODE BX;  Surgeon: Excell Seltzer, MD;  Location: [facility];  Service: General;  Laterality: Left;  .  CATARACT EXTRACTION     both  . [facility] approx 2007  . heart stent     failed-no stents  . HYSTEROSCOPY  9/10   D&C (polyp)  . LEFT HEART CATHETERIZATION WITH CORONARY ANGIOGRAM N/A 01/18/2011   Procedure: LEFT HEART CATHETERIZATION WITH CORONARY ANGIOGRAM;  Surgeon: Clent Demark, MD;  Location: [facility] CATH LAB;  Service: Cardiovascular;  Laterality: N/A;  . PERCUTANEOUS CORONARY STENT INTERVENTION (PCI-S) N/A 01/18/2011   Procedure: PERCUTANEOUS CORONARY STENT INTERVENTION  (PCI-S);  Surgeon: Clent Demark, MD;  Location: [facility] CATH LAB;  Service: Cardiovascular;  Laterality: N/A;  . TONSILLECTOMY AND ADENOIDECTOMY    . TUBAL LIGATION      Social History:  reports that she quit smoking about 21 years ago. Her smoking use included cigarettes. She has a 67.50 pack-year smoking history. She has never used smokeless tobacco. She reports current alcohol use of about 1.0 - 2.0 standard drinks of alcohol per week. She reports that she does not use drugs.  Allergies:  Allergies  Allergen Reactions  . Dilaudid [Hydromorphone Hcl] Nausea And Vomiting  . Penicillins Rash    Medications Prior to Admission  Medication Sig Dispense Refill  . albuterol (PROVENTIL HFA;VENTOLIN HFA) 108 (90 BASE) MCG/ACT inhaler Inhale 2 puffs into the lungs daily as needed for wheezing or shortness of breath.    Marland Kitchen aspirin EC 81 MG tablet Take 81 mg by mouth daily with supper.    Marland Kitchen atorvastatin (LIPITOR) 40 MG tablet Take 40 mg by mouth at bedtime.     . Budesonide-Formoterol Fumarate (SYMBICORT IN) Inhale 2 puffs into the lungs 2 (two) times daily.    . hydrochlorothiazide (HYDRODIURIL) 25 MG tablet Take 25 mg by mouth daily.     Marland Kitchen lisinopril (PRINIVIL,ZESTRIL) 40 MG tablet Take 40 mg by mouth daily.     Marland Kitchen LORazepam (ATIVAN) 1 MG tablet Take 1 mg by mouth at bedtime.    . metoprolol (LOPRESSOR) 50 MG tablet Take 50 mg by mouth 2 (two) times daily.    Marland Kitchen NITROSTAT 0.4 MG SL tablet DISSOLVE ONE TABLET UNDER THE TONGUE AS NEEDED FOR CHEST PAIN AS DIRECTED (Patient taking differently: Place 0.4 mg under the tongue every 5 (five) minutes as needed for chest pain. ) 25 tablet 2  . Vitamin D, Ergocalciferol, (DRISDOL) 1.25 MG (50000 UT) CAPS capsule TAKE ONE CAPSULE (50 000 UNITS) BY MOUTHEVERY 14 DAYS (Patient taking differently: every 7 (seven) days. TAKE ONE CAPSULE (50 000 UNITS) BY MOUTHEVERY 14 DAYS) 12 capsule 1  . Artificial Tear Solution (SOOTHE HYDRATION OP) Place 1 drop into both eyes daily  as needed (dry eyes).    . NONFORMULARY OR COMPOUNDED ITEM Vit E vaginal suppositories 200mg /ml.  One pv three times weekly. (Patient not taking: Reported on 05/27/2019) 36 each 4    Blood pressure 114/60, pulse 94, temperature 98.7 F (37.1 C), resp. rate 16, height 5\' 8"  (1.727 m), weight 80.1 kg, last menstrual period 02/27/2008, SpO2 96 %. Physical Exam:  General: pleasant, WD, overweight female who is laying in bed in NAD HEENT:  Sclera are anicteric.  PERRL.  Ears and nose without any masses or lesions.  Mouth is pink and moist Heart: regular, rate, and rhythm.  Normal s1,s2. No obvious murmurs, gallops, or rubs noted.  Palpable radial and pedal pulses bilaterally Lungs: CTAB, no wheezes, rhonchi, or rales noted.  Respiratory effort nonlabored Abd: soft, NT, ND, +BS, no masses, hernias, or  organomegaly MS: all 4 extremities are symmetrical with no cyanosis, clubbing, or edema. Skin: warm and dry with no masses, lesions, or rashes Neuro: Cranial nerves 2-12 grossly intact, sensation grossly intact throughout Psych: A&Ox3 with an appropriate affect.   Results for orders placed or performed during the hospital encounter of 05/27/19 (from the past 48 hour(s))  Urinalysis, Routine w reflex microscopic     Status: Abnormal   Collection Time: 05/27/19  4:00 PM  Result Value Ref Range   Color, Urine YELLOW YELLOW   APPearance CLEAR CLEAR   Specific Gravity, Urine 1.020 1.005 - 1.030   pH 8.0 5.0 - 8.0   Glucose, UA NEGATIVE NEGATIVE mg/dL   Hgb urine dipstick NEGATIVE NEGATIVE   Bilirubin Urine NEGATIVE NEGATIVE   Ketones, ur NEGATIVE NEGATIVE mg/dL   Protein, ur NEGATIVE NEGATIVE mg/dL   Nitrite POSITIVE (A) NEGATIVE   Leukocytes,Ua TRACE (A) NEGATIVE    Comment: Performed at [facility], [address] [place], [place] [address]  Urinalysis, Microscopic (reflex)     Status: Abnormal   Collection Time: 05/27/19  4:00 PM  Result Value Ref Range   RBC / HPF 0-5 0 - 5  RBC/hpf   WBC, UA 11-20 0 - 5 WBC/hpf   Bacteria, UA MANY (A) NONE SEEN   Squamous Epithelial / LPF 0-5 0 - 5    Comment: Performed at [facility], [address] [place], [place] [address]  Lipase, blood     Status: Abnormal   Collection Time: 05/27/19  4:22 PM  Result Value Ref Range   Lipase 69 (H) 11 - 51 U/L    Comment: Performed at [facility], [address] [place], [place] [address]  Comprehensive metabolic panel     Status: Abnormal   Collection Time: 05/27/19  4:22 PM  Result Value Ref Range   Sodium 137 135 - 145 mmol/L   Potassium 3.5 3.5 - 5.1 mmol/L   Chloride 98 98 - 111 mmol/L   CO2 28 22 - 32 mmol/L   Glucose, Bld 155 (H) 70 - 99 mg/dL    Comment: Glucose reference range applies only to samples taken after fasting for at least 8 hours.   BUN 23 8 - 23 mg/dL   Creatinine, Ser 0.78 0.44 - 1.00 mg/dL   Calcium 9.2 8.9 - 10.3 mg/dL   Total Protein 6.8 6.5 - 8.1 g/dL   Albumin 4.0 3.5 - 5.0 g/dL   AST 866 (H) 15 - 41 U/L   ALT 540 (H) 0 - 44 U/L   Alkaline Phosphatase 190 (H) 38 - 126 U/L   Total Bilirubin 3.0 (H) 0.3 - 1.2 mg/dL   GFR calc non Af Amer >60 >60 mL/min   GFR calc Af Amer >60 >60 mL/min   Anion gap 11 5 - 15    Comment: Performed at [facility], [address] [place], [place] [address]  CBC     Status: Abnormal   Collection Time: 05/27/19  4:22 PM  Result Value Ref Range   WBC 13.7 (H) 4.0 - 10.5 K/uL   RBC 4.85 3.87 - 5.11 MIL/uL   Hemoglobin 14.2 12.0 - 15.0 g/dL   HCT 42.9 36.0 - 46.0 %   MCV 88.5 80.0 - 100.0 fL   MCH 29.3 26.0 - 34.0 pg   MCHC 33.1 30.0 - 36.0 g/dL   RDW 13.2 11.5 - 15.5 %   Platelets  277 150 - 400 K/uL   nRBC 0.0 0.0 - 0.2 %    Comment: Performed at [facility], [address] [place], [place] [address]  Respiratory Panel by RT PCR (Flu A&B, Covid) - Nasopharyngeal Swab     Status: None   Collection Time: 05/27/19  6:52 PM   Specimen: Nasopharyngeal Swab  Result  Value Ref Range   SARS Coronavirus 2 by RT PCR NEGATIVE NEGATIVE    Comment: (NOTE) SARS-CoV-2 target nucleic acids are NOT DETECTED. The SARS-CoV-2 RNA is generally detectable in upper respiratoy specimens during the acute phase of infection. The lowest concentration of SARS-CoV-2 viral copies this assay can detect is 131 copies/mL. A negative result does not preclude SARS-Cov-2 infection and should not be used as the sole basis for treatment or other patient management decisions. A negative result may occur with  improper specimen collection/handling, submission of specimen other than nasopharyngeal swab, presence of viral mutation(s) within the areas targeted by this assay, and inadequate number of viral copies (<131 copies/mL). A negative result must be combined with clinical observations, patient history, and epidemiological information. The expected result is Negative. Fact Sheet for Patients:  PinkCheek.be Fact Sheet for Healthcare Providers:  GravelBags.it This test is not yet ap proved or cleared by the [place] FDA and  has been authorized for detection and/or diagnosis of SARS-CoV-2 by FDA under an Emergency Use Authorization (EUA). This EUA will remain  in effect (meaning this test can be used) for the duration of the COVID-19 declaration under Section 564(b)(1) of the Act, 21 U.S.C. section 360bbb-3(b)(1), unless the authorization is terminated or revoked sooner.    Influenza A by PCR NEGATIVE NEGATIVE   Influenza B by PCR NEGATIVE NEGATIVE    Comment: (NOTE) The Xpert Xpress SARS-CoV-2/FLU/RSV assay is intended as an aid in  the diagnosis of influenza from Nasopharyngeal swab specimens and  should not be used as a sole basis for treatment. Nasal washings and  aspirates are unacceptable for Xpert Xpress SARS-CoV-2/FLU/RSV  testing. Fact Sheet for Patients: PinkCheek.be Fact  Sheet for Healthcare Providers: GravelBags.it This test is not yet approved or cleared by the [place] FDA and  has been authorized for detection and/or diagnosis of SARS-CoV-2 by  FDA under an Emergency Use Authorization (EUA). This EUA will remain  in effect (meaning this test can be used) for the duration of the  Covid-19 declaration under Section 564(b)(1) of the Act, 21  U.S.C. section 360bbb-3(b)(1), unless the authorization is  terminated or revoked. Performed at [facility], [address] [place], [place] [address]   Magnesium     Status: None   Collection Time: 05/28/19  4:32 AM  Result Value Ref Range   Magnesium 1.9 1.7 - 2.4 mg/dL    Comment: Performed at [facility], [place] [address], [place], [place] [address]  Comprehensive metabolic panel     Status: Abnormal   Collection Time: 05/28/19  4:32 AM  Result Value Ref Range   Sodium 137 135 - 145 mmol/L   Potassium 3.3 (L) 3.5 - 5.1 mmol/L   Chloride 101 98 - 111 mmol/L   CO2 26 22 - 32 mmol/L   Glucose, Bld 107 (H) 70 - 99 mg/dL    Comment: Glucose reference range applies only to samples taken after fasting for at least 8 hours.   BUN 21 8 - 23 mg/dL   Creatinine, Ser 0.86 0.44 - 1.00 mg/dL  Calcium 8.5 (L) 8.9 - 10.3 mg/dL   Total Protein 6.2 (L) 6.5 - 8.1 g/dL   Albumin 3.6 3.5 - 5.0 g/dL   AST 1,223 (H) 15 - 41 U/L   ALT 1,279 (H) 0 - 44 U/L   Alkaline Phosphatase 199 (H) 38 - 126 U/L   Total Bilirubin 4.7 (H) 0.3 - 1.2 mg/dL   GFR calc non Af Amer >60 >60 mL/min   GFR calc Af Amer >60 >60 mL/min   Anion gap 10 5 - 15    Comment: Performed at [facility], [place] [address] [place], [place] [address]  Protime-INR     Status: None   Collection Time: 05/28/19  4:32 AM  Result Value Ref Range   Prothrombin Time 13.5 11.4 - 15.2 seconds   INR 1.0 0.8 - 1.2    Comment: (NOTE) INR goal varies based on device and disease  states. Performed at [facility], [place] [address], [place], [place] [address]   CBC WITH DIFFERENTIAL     Status: Abnormal   Collection Time: 05/28/19  4:32 AM  Result Value Ref Range   WBC 10.2 4.0 - 10.5 K/uL   RBC 4.50 3.87 - 5.11 MIL/uL   Hemoglobin 13.1 12.0 - 15.0 g/dL   HCT 40.1 36.0 - 46.0 %   MCV 89.1 80.0 - 100.0 fL   MCH 29.1 26.0 - 34.0 pg   MCHC 32.7 30.0 - 36.0 g/dL   RDW 13.3 11.5 - 15.5 %   Platelets 227 150 - 400 K/uL   nRBC 0.0 0.0 - 0.2 %   Neutrophils Relative % 85 %   Neutro Abs 8.6 (H) 1.7 - 7.7 K/uL   Lymphocytes Relative 8 %   Lymphs Abs 0.9 0.7 - 4.0 K/uL   Monocytes Relative 7 %   Monocytes Absolute 0.7 0.1 - 1.0 K/uL   Eosinophils Relative 0 %   Eosinophils Absolute 0.0 0.0 - 0.5 K/uL   Basophils Relative 0 %   Basophils Absolute 0.0 0.0 - 0.1 K/uL   Immature Granulocytes 0 %   Abs Immature Granulocytes 0.04 0.00 - 0.07 K/uL    Comment: Performed at [facility], [place] [address], [place], [place] [address]  Gamma GT     Status: Abnormal   Collection Time: 05/28/19  4:32 AM  Result Value Ref Range   GGT 320 (H) 7 - 50 U/L    Comment: Performed at [facility], [place] [address], [place], [place] [address]  Bilirubin, direct     Status: Abnormal   Collection Time: 05/28/19  4:32 AM  Result Value Ref Range   Bilirubin, Direct 2.6 (H) 0.0 - 0.2 mg/dL    Comment: Performed at [facility], [place] [address], [place], [place] [address]   US Abdomen Limited RUQ  Result Date: 05/27/2019 CLINICAL DATA:  Elevated LFTs, total bilirubin, and lipase. Elevated white blood cell count EXAM: ULTRASOUND ABDOMEN LIMITED RIGHT UPPER QUADRANT COMPARISON:  None. FINDINGS: Gallbladder: Multiple shadowing gallstones are identified measuring up to 5 mm in size. Gallbladder wall thickening measuring up to 7 mm. There is a negative sonographic Murphy sign. Common bile duct: Diameter: 12 mm Liver: No focal  lesion identified. Within normal limits in parenchymal echogenicity. Portal vein is patent on color Doppler imaging with normal direction of blood flow towards the liver. Other: None. IMPRESSION: 1. Multiple small gallstones, with gallbladder wall thickening compatible with acute cholecystitis. 2. Dilated common bile  duct.  No evidence of choledocholithiasis. Electronically Signed   By: Randa Ngo M.D.   On: 05/27/2019 18:30      Assessment/Plan CAD s/p stent in 2012 HTN HLD Anxiety Hx of breast cancer s/p lumpectomy and radiation COPD  Acute cholecystitis Possible choledocholithiasis - RUQ [place] yesterday showed cholelithiasis and gallbladder wall thickening - Tbili 3.0 on admission and now up to 4.7; lipase mildly elevated to 69 on admission, would recommend repeating today - AST/ALT 866/540 on admit and up to 1223/1279 today - recommend GI consult and agree with MRCP - we will follow for readiness for cholecystectomy   FEN: NPO, IVF VTE: SCDs, ok to have chemical prophylaxis from a surgery standpoint ID: [id number] 3/31>>  Brigid Re, [facility] Surgery 05/28/2019, 10:40 AM Please see Amion for pager number during day hours 7:00am-4:30pm

## 2019-05-28 NOTE — Consult Note (Signed)
Referring Provider: Dr. Sander Radon Primary Care Physician:  Burnard Bunting, MD Primary Gastroenterologist:  Althia Forts  Reason for Consultation:  Cholelithiasis and cholecystitis with possible choledocholithiasis  HPI: Rachel Chase is a 79 y.o. female with past medical history noted below presenting with cholecystitis and cholelithiasis with concern for choledocholithiasis.  Patient reports she has been having intermittent epigastric discomfort with nausea and vomiting for the past 9 to 12 months.  Initially, she thought she may have gastritis or dyspepsia, so she started taking Prilosec OTC.  However, yesterday, her symptoms acutely worsened and she presented to the ED.  Patient denies hematemesis, dysphagia, heartburn, changes in bowel habits, diarrhea, hematochezia, melena.    She takes 81 mg aspirin daily but denies any NSAID usage.  She denies taking blood thinners.  Her last colonoscopy was over 10 years ago and she does not remember who the provider was.  She has never had an EGD.    She denies any family history of colon or gastrointestinal cancer.  She had a laparoscopic tubal ligation many years ago but denies other abdominal surgeries.  Yesterday, right upper quadrant ultrasound showed multiple small gallstones, gallbladder wall thickening (cholecystitis), and dilated CBD but no evidence of choledocholithiasis.   Past Medical History:  Diagnosis Date  . Anxiety   . Arthritis   . Back pain    Lower Back pain - takes Cortisone shots  . Bone spur 2009   lower back  . Breast cancer ([place]) 2015   finished radiation 12/15  . Bronchitis   . CAD (coronary artery disease)    a. cath 12/2010 for cardiac arrest showed   . Cataract   . Complication of anesthesia    throat irrition from past intubations, intubation granulomas  . Coronary artery disease   . Dyspnea on exertion   . History of cardiac arrest 12/2010  . Hyperlipemia   . Hypertension   . MI (myocardial  infarction) ([place]) 12/2010   Was followed by Dr. Pernell Dupre  . Personal history of radiation therapy   . 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 Radiation 01/06/14-02/04/14   left breast   . Rosacea 2008   both eyes    Past Surgical History:  Procedure Laterality Date  . ANTERIOR LAT LUMBAR FUSION N/A 11/18/2018   Procedure: Lumbar One-Two Anterolateral lumbar decompression/fusion/lateral plate fixation;  Surgeon: Kristeen Miss, MD;  Location: [facility];  Service: Neurosurgery;  Laterality: N/A;  Lumbar One-Two Anterolateral lumbar decompression/fusion/lateral plate fixation  . BACK SURGERY    . BREAST BIOPSY  8/10   fibrocystic changes  . BREAST LUMPECTOMY Left 2015  . BREAST LUMPECTOMY WITH NEEDLE LOCALIZATION AND AXILLARY SENTINEL LYMPH NODE BX Left 11/13/2013   Procedure: NEEDLE LOCALIZATION LEFT BREAST LUMPECTOMY WITH LEFT  AXILLARY SENTINEL LYMPH NODE BX;  Surgeon: Excell Seltzer, MD;  Location: [facility];  Service: General;  Laterality: Left;  . CATARACT EXTRACTION     both  . [facility] approx 2007  . heart stent     failed-no stents  . HYSTEROSCOPY  9/10   D&C (polyp)  . LEFT HEART CATHETERIZATION WITH CORONARY ANGIOGRAM N/A 01/18/2011   Procedure: LEFT HEART CATHETERIZATION WITH CORONARY ANGIOGRAM;  Surgeon: Clent Demark, MD;  Location: [facility] CATH LAB;  Service: Cardiovascular;  Laterality: N/A;  . PERCUTANEOUS CORONARY STENT INTERVENTION (PCI-S) N/A 01/18/2011   Procedure: PERCUTANEOUS  CORONARY STENT INTERVENTION (PCI-S);  Surgeon: Clent Demark, MD;  Location: [facility] CATH LAB;  Service: Cardiovascular;  Laterality: N/A;  . TONSILLECTOMY AND ADENOIDECTOMY    . TUBAL LIGATION      Prior to Admission medications   Medication Sig Start Date End Date Taking? Authorizing Provider  albuterol (PROVENTIL HFA;VENTOLIN HFA) 108 (90 BASE) MCG/ACT inhaler Inhale 2 puffs  into the lungs daily as needed for wheezing or shortness of breath.   Yes [provider]  aspirin EC 81 MG tablet Take 81 mg by mouth daily with supper.   Yes [provider]  atorvastatin (LIPITOR) 40 MG tablet Take 40 mg by mouth at bedtime.  10/08/12  Yes [provider]  Budesonide-Formoterol Fumarate (SYMBICORT IN) Inhale 2 puffs into the lungs 2 (two) times daily.   Yes [provider]  hydrochlorothiazide (HYDRODIURIL) 25 MG tablet Take 25 mg by mouth daily.  10/05/13  Yes [provider]  lisinopril (PRINIVIL,ZESTRIL) 40 MG tablet Take 40 mg by mouth daily.  10/20/13  Yes [provider]  LORazepam (ATIVAN) 1 MG tablet Take 1 mg by mouth at bedtime.   Yes [provider]  metoprolol (LOPRESSOR) 50 MG tablet Take 50 mg by mouth 2 (two) times daily.   Yes [provider]  NITROSTAT 0.4 MG SL tablet DISSOLVE ONE TABLET UNDER THE TONGUE AS NEEDED FOR CHEST PAIN AS DIRECTED Patient taking differently: Place 0.4 mg under the tongue every 5 (five) minutes as needed for chest pain.  03/15/17  Yes Belva Crome, MD  Vitamin D, Ergocalciferol, (DRISDOL) 1.25 MG (50000 UT) CAPS capsule TAKE ONE CAPSULE (50 000 UNITS) BY MOUTHEVERY 14 DAYS Patient taking differently: every 7 (seven) days. TAKE ONE CAPSULE (50 000 UNITS) BY MOUTHEVERY 14 DAYS 09/05/18  Yes Megan Salon, MD  Artificial Tear Solution (SOOTHE HYDRATION OP) Place 1 drop into both eyes daily as needed (dry eyes).    [provider]  NONFORMULARY OR COMPOUNDED ITEM Vit E vaginal suppositories 215m/ml.  One pv three times weekly. Patient not taking: Reported on 05/27/2019 09/05/18   MMegan Salon MD    Scheduled Meds: . budesonide-formoterol  2 puff Inhalation BID  . sodium chloride flush  3 mL Intravenous Q12H   Continuous Infusions: . sodium chloride 75 mL/hr at 05/28/19 1140  . ciprofloxacin 400 mg (05/28/19 0649)  . metronidazole 500 mg (05/28/19 1452)    PRN Meds:.albuterol, fentaNYL (SUBLIMAZE) injection, LORazepam, ondansetron (ZOFRAN) IV  Allergies as of 05/27/2019 - Review Complete 05/27/2019  Allergen Reaction Noted  . Dilaudid [hydromorphone hcl] Nausea And Vomiting 10/09/2012  . Penicillins Rash 01/27/2011    Family History  Problem Relation Age of Onset  . Congestive Heart Failure Father   . CVA Father        MI, CAD  . Coronary artery disease Father   . Valvular heart disease Mother   . Hypertension Mother   . Rectal cancer Sister   . Breast cancer Sister   . Breast cancer Maternal Grandmother 90  . Breast cancer Maternal Aunt   . Cancer Sister     Social History   Socioeconomic History  . Marital status: Widowed    Spouse name: Not on file  . Number of children: 3  . Years of education: Not on file  . Highest education level: Not on file  Occupational History  . Not on file  Tobacco Use  . Smoking status: Former Smoker    Packs/day: 1.50  Years: 45.00    Pack years: 67.50    Types: Cigarettes    Quit date: 04/17/1998    Years since quitting: 21.1  . Smokeless tobacco: Never Used  . Tobacco comment: nicorette gum  Substance and Sexual Activity  . Alcohol use: Yes    Alcohol/week: 1.0 - 2.0 standard drinks    Types: 1 - 2 Standard drinks or equivalent per week    Comment: occ  . Drug use: No  . Sexual activity: Never    Partners: Male    Birth control/protection: Abstinence  Other Topics Concern  . Not on file  Social History Narrative  . Not on file   Social Determinants of Health   Financial Resource Strain:   . Difficulty of Paying Living Expenses:   Food Insecurity:   . Worried About Charity fundraiser in the Last Year:   . Arboriculturist in the Last Year:   Transportation Needs:   . Film/video editor (Medical):   Marland Kitchen Lack of Transportation (Non-Medical):   Physical Activity:   . Days of Exercise per Week:   . Minutes of Exercise per Session:   Stress:   . Feeling of Stress  :   Social Connections:   . Frequency of Communication with Friends and Family:   . Frequency of Social Gatherings with Friends and Family:   . Attends Religious Services:   . Active Member of Clubs or Organizations:   . Attends Archivist Meetings:   Marland Kitchen Marital Status:   Intimate Partner Violence:   . Fear of Current or Ex-Partner:   . Emotionally Abused:   Marland Kitchen Physically Abused:   . Sexually Abused:     Review of Systems: All negative except as stated above in HPI.  Physical Exam: Vital signs: Vitals:   05/28/19 0935 05/28/19 1351  BP: 114/60 132/80  Pulse: 94 94  Resp: 16 16  Temp: 98.7 F (37.1 C) 97.6 F (36.4 C)  SpO2: 96% 99%   Last BM Date: 05/27/19 General:   Alert,  Well-developed, well-nourished, pleasant and cooperative in NAD Head: normocephalic, atraumatic Eyes: anicteric sclera ENT: oropharynx clear; scleral icterus Neck: supple, nontender Lungs:  Clear throughout to auscultation.   No wheezes, crackles, or rhonchi. No acute distress.  Breathing comfortably on room air Heart:  Regular rate and rhythm; no murmurs, clicks, rubs,  or gallops. Abdomen: Right upper quadrant tenderness to palpation.  Soft, nondistended.  No guarding or peritoneal signs.  Normoactive bowel sounds. Rectal:  Deferred Ext: no edema  GI:  Lab Results: Recent Labs    05/27/19 1622 05/28/19 0432  WBC 13.7* 10.2  HGB 14.2 13.1  HCT 42.9 40.1  PLT 277 227   BMET Recent Labs    05/27/19 1622 05/28/19 0432  NA 137 137  K 3.5 3.3*  CL 98 101  CO2 28 26  GLUCOSE 155* 107*  BUN 23 21  CREATININE 0.78 0.86  CALCIUM 9.2 8.5*   LFT Recent Labs    05/28/19 0432  PROT 6.2*  ALBUMIN 3.6  AST 1,223*  ALT 1,279*  ALKPHOS 199*  BILITOT 4.7*  BILIDIR 2.6*   PT/INR Recent Labs    05/28/19 0432  LABPROT 13.5  INR 1.0     Studies/Results: US Abdomen Limited RUQ  Result Date: 05/27/2019 CLINICAL DATA:  Elevated LFTs, total bilirubin, and lipase. Elevated  white blood cell count EXAM: ULTRASOUND ABDOMEN LIMITED RIGHT UPPER QUADRANT COMPARISON:  None. FINDINGS: Gallbladder: Multiple shadowing  gallstones are identified measuring up to 5 mm in size. Gallbladder wall thickening measuring up to 7 mm. There is a negative sonographic Murphy sign. Common bile duct: Diameter: 12 mm Liver: No focal lesion identified. Within normal limits in parenchymal echogenicity. Portal vein is patent on color Doppler imaging with normal direction of blood flow towards the liver. Other: None. IMPRESSION: 1. Multiple small gallstones, with gallbladder wall thickening compatible with acute cholecystitis. 2. Dilated common bile duct.  No evidence of choledocholithiasis. Electronically Signed   By: Randa Ngo M.D.   On: 05/27/2019 18:30    Impression/Plan: Cholelithiasis and cholecystitis with concern for choledocholithiasis.  LFTs continue to rise.  Today, AST 1223/ ALT 1279/ alk phos 199/ T bili 4.7 compared to AST 866/ ALT 540/ alk phos 190/ T bili 3.0 yesterday.  CBC remains normal with the exception of mildly elevated neutrophils (8.6); WBCs remain within normal limits (10.2).  Case discussed with Dr. Watt Climes who will see the patient in the morning.  Plan for MRCP today.   -If MRCP is positive, we will proceed with ERCP followed by laparoscopic cholecystectomy.   -If MRCP is negative, recommend laparoscopic cholecystectomy with intraoperative cholangiogram.  I fully discussed ERCP, as well as benefits and risks, to include pancreatitis, bleeding, perforation, and infection.  Patient was given the opportunity ask questions.  Patient in agreement to proceed with ERCP if MRCP is positive for CBD stones.    [facility] will follow.   LOS: 1 day   Salley Slaughter  05/28/2019, 4:20 PM  Questions please call [phone]

## 2019-05-28 NOTE — Progress Notes (Signed)
Patient IV got infiltrated during shift change, IV team was consulted. MRI called and is ready for the patient but unfortunately since patient doesn't have any IV access they could not continue , MRI then agreed to let the patient eat for now and keep her NPO after midnight and move the scan for tomorrow. We will continue to monitor.

## 2019-05-29 ENCOUNTER — Inpatient Hospital Stay (HOSPITAL_COMMUNITY): Payer: Medicare Other | Admitting: Anesthesiology

## 2019-05-29 ENCOUNTER — Inpatient Hospital Stay (HOSPITAL_COMMUNITY): Payer: Medicare Other

## 2019-05-29 ENCOUNTER — Encounter (HOSPITAL_COMMUNITY): Payer: Self-pay | Admitting: Internal Medicine

## 2019-05-29 ENCOUNTER — Encounter (HOSPITAL_COMMUNITY)
Admission: EM | Disposition: A | Discharge status: Home / Self Care | Payer: Self-pay | Source: Home / Self Care | Attending: Internal Medicine

## 2019-05-29 HISTORY — PX: ERCP: SHX5425

## 2019-05-29 HISTORY — PX: BILIARY DILATION: SHX6850

## 2019-05-29 HISTORY — PX: REMOVAL OF STONES: SHX5545

## 2019-05-29 HISTORY — PX: SPHINCTEROTOMY: SHX5544

## 2019-05-29 LAB — URINE CULTURE: Culture: >=100000 — AB

## 2019-05-29 LAB — COMPREHENSIVE METABOLIC PANEL
ALT: 675 U/L — ABNORMAL HIGH (ref 0–44)
AST: 374 U/L — ABNORMAL HIGH (ref 15–41)
Albumin: 3.4 g/dL — ABNORMAL LOW (ref 3.5–5.0)
Alkaline Phosphatase: 169 U/L — ABNORMAL HIGH (ref 38–126)
Anion gap: 10 (ref 5–15)
BUN: 17 mg/dL (ref 8–23)
CO2: 25 mmol/L (ref 22–32)
Calcium: 8.4 mg/dL — ABNORMAL LOW (ref 8.9–10.3)
Chloride: 105 mmol/L (ref 98–111)
Creatinine, Ser: 0.83 mg/dL (ref 0.44–1.00)
GFR calc Af Amer: >60 mL/min (ref >60)
GFR calc non Af Amer: >60 mL/min (ref >60)
Glucose, Bld: 119 mg/dL — ABNORMAL HIGH (ref 70–99)
Potassium: 3.6 mmol/L (ref 3.5–5.1)
Sodium: 140 mmol/L (ref 135–145)
Total Bilirubin: 3.4 mg/dL — ABNORMAL HIGH (ref 0.3–1.2)
Total Protein: 6 g/dL — ABNORMAL LOW (ref 6.5–8.1)

## 2019-05-29 LAB — CBC WITH DIFFERENTIAL/PLATELET
Abs Immature Granulocytes: 0.03 10*3/uL (ref 0.00–0.07)
Basophils Absolute: 0.1 10*3/uL (ref 0.0–0.1)
Basophils Relative: 1 %
Eosinophils Absolute: 0.2 10*3/uL (ref 0.0–0.5)
Eosinophils Relative: 2 %
HCT: 38.5 % (ref 36.0–46.0)
Hemoglobin: 12.6 g/dL (ref 12.0–15.0)
Immature Granulocytes: 0 %
Lymphocytes Relative: 13 %
Lymphs Abs: 1 10*3/uL (ref 0.7–4.0)
MCH: 29.4 pg (ref 26.0–34.0)
MCHC: 32.7 g/dL (ref 30.0–36.0)
MCV: 90 fL (ref 80.0–100.0)
Monocytes Absolute: 0.7 10*3/uL (ref 0.1–1.0)
Monocytes Relative: 9 %
Neutro Abs: 5.8 10*3/uL (ref 1.7–7.7)
Neutrophils Relative %: 75 %
Platelets: 185 10*3/uL (ref 150–400)
RBC: 4.28 MIL/uL (ref 3.87–5.11)
RDW: 13.8 % (ref 11.5–15.5)
WBC: 7.8 10*3/uL (ref 4.0–10.5)
nRBC: 0 % (ref 0.0–0.2)

## 2019-05-29 SURGERY — ERCP, WITH INTERVENTION IF INDICATED
Anesthesia: General

## 2019-05-29 MED ORDER — LACTATED RINGERS IV SOLN: INTRAVENOUS | Status: DC | PRN

## 2019-05-29 MED ORDER — PHENYLEPHRINE 40 MCG/ML (10ML) SYRINGE FOR IV PUSH (FOR BLOOD PRESSURE SUPPORT)
PREFILLED_SYRINGE | INTRAVENOUS | Status: DC | PRN
  Administered 2019-05-29: 100 ug via INTRAVENOUS
  Administered 2019-05-29 (×2): 80 ug via INTRAVENOUS

## 2019-05-29 MED ORDER — ROCURONIUM BROMIDE 10 MG/ML (PF) SYRINGE
PREFILLED_SYRINGE | INTRAVENOUS | Status: DC | PRN
  Administered 2019-05-29: 60 mg via INTRAVENOUS

## 2019-05-29 MED ORDER — INDOMETHACIN 50 MG RE SUPP
RECTAL | Status: DC | PRN
  Administered 2019-05-29: 100 mg via RECTAL

## 2019-05-29 MED ORDER — PROPOFOL 10 MG/ML IV BOLUS
INTRAVENOUS | Status: AC
  Filled 2019-05-29: qty 20

## 2019-05-29 MED ORDER — ONDANSETRON HCL 4 MG/2ML IJ SOLN
INTRAMUSCULAR | Status: DC | PRN
  Administered 2019-05-29: 4 mg via INTRAVENOUS

## 2019-05-29 MED ORDER — SUGAMMADEX SODIUM 200 MG/2ML IV SOLN
INTRAVENOUS | Status: DC | PRN
  Administered 2019-05-29: 158.8 mg via INTRAVENOUS

## 2019-05-29 MED ORDER — PROPOFOL 10 MG/ML IV BOLUS
INTRAVENOUS | Status: DC | PRN
  Administered 2019-05-29: 150 mg via INTRAVENOUS

## 2019-05-29 MED ORDER — SODIUM CHLORIDE 0.9 % IV SOLN
INTRAVENOUS | Status: DC | PRN
  Administered 2019-05-29: 16:00:00 40 mL

## 2019-05-29 MED ORDER — FENTANYL CITRATE (PF) 250 MCG/5ML IJ SOLN
INTRAMUSCULAR | Status: DC | PRN
  Administered 2019-05-29: 100 ug via INTRAVENOUS

## 2019-05-29 MED ORDER — SODIUM CHLORIDE 0.9 % IV SOLN: INTRAVENOUS | Status: DC

## 2019-05-29 MED ORDER — GADOBUTROL 1 MMOL/ML IV SOLN
8.0000 mL | Freq: Once | INTRAVENOUS | Status: AC | PRN
  Administered 2019-05-29: 8 mL via INTRAVENOUS

## 2019-05-29 MED ORDER — INDOMETHACIN 50 MG RE SUPP
RECTAL | Status: AC
  Filled 2019-05-29: qty 1

## 2019-05-29 MED ORDER — FENTANYL CITRATE (PF) 250 MCG/5ML IJ SOLN
INTRAMUSCULAR | Status: AC
  Filled 2019-05-29: qty 5

## 2019-05-29 MED ORDER — GLUCAGON HCL RDNA (DIAGNOSTIC) 1 MG IJ SOLR
INTRAMUSCULAR | Status: AC
  Filled 2019-05-29: qty 1

## 2019-05-29 MED ORDER — LIDOCAINE 2% (20 MG/ML) 5 ML SYRINGE
INTRAMUSCULAR | Status: DC | PRN
  Administered 2019-05-29: 50 mg via INTRAVENOUS

## 2019-05-29 MED ORDER — EPINEPHRINE 1 MG/10ML IJ SOSY
PREFILLED_SYRINGE | INTRAMUSCULAR | Status: AC
  Filled 2019-05-29: qty 10

## 2019-05-29 MED ORDER — PHENYLEPHRINE HCL-NACL 10-0.9 MG/250ML-% IV SOLN
INTRAVENOUS | Status: DC | PRN
  Administered 2019-05-29: 40 ug/min via INTRAVENOUS

## 2019-05-29 NOTE — Anesthesia Procedure Notes (Signed)
Procedure Name: Intubation Date/Time: 05/29/2019 2:51 PM Performed by: Anne Fu, CRNA Pre-anesthesia Checklist: Patient identified, Emergency Drugs available, Suction available, Patient being monitored and Timeout performed Patient Re-evaluated:Patient Re-evaluated prior to induction Oxygen Delivery Method: Circle system utilized Preoxygenation: Pre-oxygenation with 100% oxygen Induction Type: IV induction Ventilation: Mask ventilation without difficulty Laryngoscope Size: Mac and 4 Grade View: Grade III Tube type: Oral Tube size: 7.5 mm Number of attempts: 1 Airway Equipment and Method: Stylet Placement Confirmation: ETT inserted through vocal cords under direct vision,  positive ETCO2 and breath sounds checked- equal and bilateral Secured at: 22 cm Tube secured with: Tape Dental Injury: Teeth and Oropharynx as per pre-operative assessment

## 2019-05-29 NOTE — H&P (View-Only) (Signed)
   Subjective/Chief Complaint: no pain Pt denies abdominal pain Awaiting MRCP    Objective: Vital signs in last 24 hours: Temp:  [97.6 F (36.4 C)-98.7 F (37.1 C)] 98.3 F (36.8 C) (04/02 0530) Pulse Rate:  [94-105] 105 (04/02 0530) Resp:  [16-18] 17 (04/02 0530) BP: (114-136)/(60-113) 134/61 (04/02 0530) SpO2:  [95 %-99 %] 96 % (04/02 0801) Last BM Date: 05/26/19  Intake/Output from previous day: 04/01 0701 - 04/02 0700 In: 2312.5 [I.V.:1148.2; IV Piggyback:1164.3] Out: 0  Intake/Output this shift: No intake/output data recorded.  General appearance: alert and cooperative Resp: clear to auscultation bilaterally Cardio: regular rate and rhythm, S1, S2 normal, no murmur, click, rub or gallop GI: soft, non-tender; bowel sounds normal; no masses,  no organomegaly  Lab Results:  Recent Labs    05/28/19 0432 05/29/19 0436  WBC 10.2 7.8  HGB 13.1 12.6  HCT 40.1 38.5  PLT 227 185   BMET Recent Labs    05/28/19 0432 05/29/19 0436  NA 137 140  K 3.3* 3.6  CL 101 105  CO2 26 25  GLUCOSE 107* 119*  BUN 21 17  CREATININE 0.86 0.83  CALCIUM 8.5* 8.4*   PT/INR Recent Labs    05/28/19 0432  LABPROT 13.5  INR 1.0   ABG No results for input(s): PHART, HCO3 in the last 72 hours.  Invalid input(s): PCO2, PO2  Studies/Results: US Abdomen Limited RUQ  Result Date: 05/27/2019 CLINICAL DATA:  Elevated LFTs, total bilirubin, and lipase. Elevated white blood cell count EXAM: ULTRASOUND ABDOMEN LIMITED RIGHT UPPER QUADRANT COMPARISON:  None. FINDINGS: Gallbladder: Multiple shadowing gallstones are identified measuring up to 5 mm in size. Gallbladder wall thickening measuring up to 7 mm. There is a negative sonographic Murphy sign. Common bile duct: Diameter: 12 mm Liver: No focal lesion identified. Within normal limits in parenchymal echogenicity. Portal vein is patent on color Doppler imaging with normal direction of blood flow towards the liver. Other: None.  IMPRESSION: 1. Multiple small gallstones, with gallbladder wall thickening compatible with acute cholecystitis. 2. Dilated common bile duct.  No evidence of choledocholithiasis. Electronically Signed   By: Randa Ngo M.D.   On: 05/27/2019 18:30    Anti-infectives: Anti-infectives (From admission, onward)   Start     Dose/Rate Route Frequency Ordered Stop   05/28/19 0600  ciprofloxacin (CIPRO) IVPB 400 mg     400 mg 200 mL/hr over 60 Minutes Intravenous Every 12 hours 05/28/19 0420     05/28/19 0430  metroNIDAZOLE (FLAGYL) IVPB 500 mg     500 mg 100 mL/hr over 60 Minutes Intravenous Every 8 hours 05/28/19 0420     05/27/19 1730  metroNIDAZOLE (FLAGYL) IVPB 500 mg     500 mg 100 mL/hr over 60 Minutes Intravenous  Once 05/27/19 1719 05/27/19 1933   05/27/19 1700  ciprofloxacin (CIPRO) IVPB 400 mg     400 mg 200 mL/hr over 60 Minutes Intravenous  Once 05/27/19 1659 05/27/19 1829      Assessment/Plan: Acute cholecystitis Possible choledocholithiasis - RUQ [place] yesterday showed cholelithiasis and gallbladder wall thickening -await MRCP If no evidence of choledocolithiasis, plan LGB Saturday If she needs ERCP, plan surgery for post ERCP Ok to advance diet if procedure not done today   - we will follow for readiness for cholecystectomy   FEN: NPO, IVF VTE: SCDs, ok to have chemical prophylaxis from a surgery standpoint ID: cipro/flagyl 3/31>>   LOS: 2 days    Rachel Chase Rachel Chase 05/29/2019

## 2019-05-29 NOTE — Anesthesia Preprocedure Evaluation (Addendum)
Anesthesia Evaluation   Patient awake    Reviewed: Allergy & Precautions, NPO status , Patient's Chart, lab work & pertinent test results  Airway Mallampati: I  TM Distance: <3 FB Neck ROM: Limited    Dental  (+) Teeth Intact, Dental Advisory Given   Pulmonary former smoker,    breath sounds clear to auscultation       Cardiovascular hypertension, Pt. on medications and Pt. on home beta blockers + Past MI and + Cardiac Stents   Rhythm:Regular Rate:Normal     Neuro/Psych Anxiety    GI/Hepatic negative GI ROS, Neg liver ROS,   Endo/Other  negative endocrine ROS  Renal/GU negative Renal ROS     Musculoskeletal  (+) Arthritis ,   Abdominal Normal abdominal exam  (+)   Peds  Hematology negative hematology ROS (+)   Anesthesia Other Findings   Reproductive/Obstetrics                           Anesthesia Physical Anesthesia Plan  ASA: III  Anesthesia Plan: General   Post-op Pain Management:    Induction: Intravenous  PONV Risk Score and Plan: 1 and Ondansetron and Treatment may vary due to age or medical condition  Airway Management Planned: Oral ETT  Additional Equipment: None  Intra-op Plan:   Post-operative Plan: Extubation in OR  Informed Consent: I have reviewed the patients History and Physical, chart, labs and discussed the procedure including the risks, benefits and alternatives for the proposed anesthesia with the patient or authorized representative who has indicated his/her understanding and acceptance.     Dental advisory given  Plan Discussed with: CRNA  Anesthesia Plan Comments:        Anesthesia Quick Evaluation

## 2019-05-29 NOTE — Anesthesia Postprocedure Evaluation (Signed)
Anesthesia Post Note  Patient: Shondreka Kasinger Figueira  Procedure(s) Performed: ENDOSCOPIC RETROGRADE CHOLANGIOPANCREATOGRAPHY (ERCP) (N/A ) SPHINCTEROTOMY BILIARY DILATION REMOVAL OF STONES     Patient location during evaluation: PACU Anesthesia Type: General Level of consciousness: awake and alert Pain management: pain level controlled Vital Signs Assessment: post-procedure vital signs reviewed and stable Respiratory status: spontaneous breathing, nonlabored ventilation, respiratory function stable and patient connected to nasal cannula oxygen Cardiovascular status: blood pressure returned to baseline and stable Postop Assessment: no apparent nausea or vomiting Anesthetic complications: no    Last Vitals:  Vitals:   05/29/19 1701 05/29/19 1950  BP: 135/75   Pulse: 95   Resp: 18   Temp: 36.9 C   SpO2: 98% 97%    Last Pain:  Vitals:   05/29/19 1701  TempSrc: Oral  PainSc: 0-No pain                 Effie Berkshire

## 2019-05-29 NOTE — Progress Notes (Signed)
   Subjective/Chief Complaint: no pain Pt denies abdominal pain Awaiting MRCP    Objective: Vital signs in last 24 hours: Temp:  [97.6 F (36.4 C)-98.7 F (37.1 C)] 98.3 F (36.8 C) (04/02 0530) Pulse Rate:  [94-105] 105 (04/02 0530) Resp:  [16-18] 17 (04/02 0530) BP: (114-136)/(60-113) 134/61 (04/02 0530) SpO2:  [95 %-99 %] 96 % (04/02 0801) Last BM Date: 05/26/19  Intake/Output from previous day: 04/01 0701 - 04/02 0700 In: 2312.5 [I.V.:1148.2; IV Piggyback:1164.3] Out: 0  Intake/Output this shift: No intake/output data recorded.  General appearance: alert and cooperative Resp: clear to auscultation bilaterally Cardio: regular rate and rhythm, S1, S2 normal, no murmur, click, rub or gallop GI: soft, non-tender; bowel sounds normal; no masses,  no organomegaly  Lab Results:  Recent Labs    05/28/19 0432 05/29/19 0436  WBC 10.2 7.8  HGB 13.1 12.6  HCT 40.1 38.5  PLT 227 185   BMET Recent Labs    05/28/19 0432 05/29/19 0436  NA 137 140  K 3.3* 3.6  CL 101 105  CO2 26 25  GLUCOSE 107* 119*  BUN 21 17  CREATININE 0.86 0.83  CALCIUM 8.5* 8.4*   PT/INR Recent Labs    05/28/19 0432  LABPROT 13.5  INR 1.0   ABG No results for input(s): PHART, HCO3 in the last 72 hours.  Invalid input(s): PCO2, PO2  Studies/Results: US Abdomen Limited RUQ  Result Date: 05/27/2019 CLINICAL DATA:  Elevated LFTs, total bilirubin, and lipase. Elevated white blood cell count EXAM: ULTRASOUND ABDOMEN LIMITED RIGHT UPPER QUADRANT COMPARISON:  None. FINDINGS: Gallbladder: Multiple shadowing gallstones are identified measuring up to 5 mm in size. Gallbladder wall thickening measuring up to 7 mm. There is a negative sonographic Murphy sign. Common bile duct: Diameter: 12 mm Liver: No focal lesion identified. Within normal limits in parenchymal echogenicity. Portal vein is patent on color Doppler imaging with normal direction of blood flow towards the liver. Other: None.  IMPRESSION: 1. Multiple small gallstones, with gallbladder wall thickening compatible with acute cholecystitis. 2. Dilated common bile duct.  No evidence of choledocholithiasis. Electronically Signed   By: Randa Ngo M.D.   On: 05/27/2019 18:30    Anti-infectives: Anti-infectives (From admission, onward)   Start     Dose/Rate Route Frequency Ordered Stop   05/28/19 0600  ciprofloxacin (CIPRO) IVPB 400 mg     400 mg 200 mL/hr over 60 Minutes Intravenous Every 12 hours 05/28/19 0420     05/28/19 0430  metroNIDAZOLE (FLAGYL) IVPB 500 mg     500 mg 100 mL/hr over 60 Minutes Intravenous Every 8 hours 05/28/19 0420     05/27/19 1730  metroNIDAZOLE (FLAGYL) IVPB 500 mg     500 mg 100 mL/hr over 60 Minutes Intravenous  Once 05/27/19 1719 05/27/19 1933   05/27/19 1700  ciprofloxacin (CIPRO) IVPB 400 mg     400 mg 200 mL/hr over 60 Minutes Intravenous  Once 05/27/19 1659 05/27/19 1829      Assessment/Plan: Acute cholecystitis Possible choledocholithiasis - RUQ [place] yesterday showed cholelithiasis and gallbladder wall thickening -await MRCP If no evidence of choledocolithiasis, plan LGB Saturday If she needs ERCP, plan surgery for post ERCP Ok to advance diet if procedure not done today   - we will follow for readiness for cholecystectomy   FEN: NPO, IVF VTE: SCDs, ok to have chemical prophylaxis from a surgery standpoint ID: cipro/flagyl 3/31>>   LOS: 2 days    Rachel Chase Rachel Chase 05/29/2019

## 2019-05-29 NOTE — Progress Notes (Signed)
Rachel Chase 2:37 PM  Subjective: Patient seen and examined and discussed yesterday with our PA and she has been feeling better for 3 days but has had GI issues for the last 9 months off and on and her hospital computer chart was reviewed including her MRI and surgical notes and she has no other complaints   Objective: Vital signs stable afebrile no acute distress exam please see preassessment evaluation labs continue to improve CBC okay INR okay MRI positive for both gallstones and CBD stones  Assessment: Gallstones and CBD stones  Plan: The risks benefits methods and success rate of ERCP was discussed with the patient and will proceed today with anesthesia assistance and probable lap chole tomorrow if no delayed complications  [facility]  office [phone] After 5PM or if no answer call [phone]

## 2019-05-29 NOTE — Transfer of Care (Signed)
Immediate Anesthesia Transfer of Care Note  Patient: Rachel Chase  Procedure(s) Performed: Procedure(s): ENDOSCOPIC RETROGRADE CHOLANGIOPANCREATOGRAPHY (ERCP) (N/A) SPHINCTEROTOMY BILIARY DILATION REMOVAL OF STONES  Patient Location: PACU  Anesthesia Type:General  Level of Consciousness:  sedated, patient cooperative and responds to stimulation  Airway & Oxygen Therapy:Patient Spontanous Breathing and Patient connected to face mask oxgen  Post-op Assessment:  Report given to PACU RN and Post -op Vital signs reviewed and stable  Post vital signs:  Reviewed and stable  Last Vitals:  Vitals:   05/29/19 0801 05/29/19 1352  BP:  (!) 161/59  Pulse:    Resp:  (!) 22  Temp:  37.1 C  SpO2: 0000000     Complications: No apparent anesthesia complications

## 2019-05-29 NOTE — Progress Notes (Signed)
PROGRESS NOTE    Rachel Chase  [id number] DOB: 1940/08/08 DOA: 05/27/2019 PCP: Burnard Bunting, MD    Brief Narrative:  Patient was admitted to the hospital with working diagnosis of acute cholecystitis to rule out choledocholithiasis  79 year old female who presented with nausea and vomiting.  She does have significant past medical history for coronary artery disease status post PCI, 2012, hypertension, dyslipidemia and anxiety.  She was transferred from [facility] ED for further management of acute cholecystitis.  Patient reported postpandrial right upper quadrant abdominal discomfort for about 6 months, worsening over the last 3 to 4 days, associated with nausea and vomiting.  On his initial physical examination blood pressure 127/58, heart rate 102, respiratory rate 24, temperature 98.3, oxygen saturation 97%, her lungs are clear to auscultation bilaterally, heart S1-S2 present rhythmic, her abdomen was soft, positive tenderness to palpation right upper quadrant, negative Murphy sign, no guarding or rigidity, no lower extremity edema. Sodium 137, potassium 3.5, chloride 98, bicarb 28, glucose 155, BUN 33, creatinine 0.78, alkaline phosphatase 190, lipase 69, AST 866, ALT 540, total bilirubins 3.0, white count 13.7, hemoglobin 14.2, hematocrit 42.9, platelets 277.  SARS COVID-19 negative, urinalysis 0-5 white cells.  Abdominal ultrasound with multiple small gallstones with gallbladder wall thickening, dilated common bile duct, no evidence of choledocholithiasis.  EKG 80 bpm, normal axis, normal intervals, sinus rhythm, no ST segment T wave changes.  Further work up with MRCP, pending report, will need ERCP or cholecystectomy with intraoperative cholangiogram.    Assessment & Plan:   Principal Problem:   Acute cholecystitis Active Problems:   Hyperlipemia   Abdominal pain   Nausea & vomiting   Hypertension   1. Acute cholecystitis to rule out  choledocholithiasis. Patient continue to have improvement in her symptoms.  Improvement in liver profile with AST from 1,223 to 374, ALT from 1,279 to 675, T Bil down to 3,4. Patient has remained NPO with good toleration.   MRCP this am with mild common bile duct dilatation, but no frank choledocholithiasis. For now continue with antibiotic therapy.   Patient will have ERCP and cholecystectomy per GI and surgery recommendations.   2. HTN. Blood pressure hold 161/59 will continue to hold on antihypertensive medications, for now. Continue blood pressure monitoring in the post op to resume home antihypertensive medications.  3. Dyslipidemia. Resume statin post op.   4. COPD. No clinical signs of exacerbation,on inhaled corticosteroids and bronchodilators.   DVT prophylaxis: enoxaparin   Code Status:  full Family Communication: no family at the bedside  Disposition Plan/ discharge barriers: patient from home, barrier for dc need for IV therapies and likely surgical intervention.    Consultants:   Surgery   GI    Antimicrobials:    Ciprofloxacin   Metronidazole.      Subjective: Patient continue to fee better, no nausea or vomiting, no chest pain or dyspnea, resolving coliuria. She has been NPO since admission. Had MRCP today.    Objective: Vitals:   05/28/19 2008 05/28/19 2233 05/29/19 0530 05/29/19 0801  BP:  130/60 134/61   Pulse:  (!) 105 (!) 105   Resp:  18 17   Temp:  97.8 F (36.6 C) 98.3 F (36.8 C)   TempSrc:  Oral Oral   SpO2: 98% 95% 95% 96%  Weight:      Height:        Intake/Output Summary (Last 24 hours) at 05/29/2019 1114 Last data filed at 05/29/2019 1000 Gross per 24 hour  Intake 2420.36 ml  Output 0 ml  Net 2420.36 ml   Filed Weights   05/27/19 1600 05/27/19 2201 05/28/19 0500  Weight: 81.2 kg 78.4 kg 80.1 kg    Examination:   General: Not in pain or dyspnea, deconditioned  Neurology: Awake and alert, non focal  E ENT: mild  pallor, no icterus, oral mucosa moist Cardiovascular: No JVD. S1-S2 present, rhythmic, no gallops, rubs, or murmurs. No lower extremity edema. Pulmonary: positive  breath sounds bilaterally, adequate air movement, no wheezing, rhonchi or rales. Gastrointestinal. Abdomen mild distended but no organomegaly, non tender, no rebound or guarding, soft.  Skin. No rashes Musculoskeletal: no joint deformities     Data Reviewed: I have personally reviewed following labs and imaging studies  CBC: Recent Labs  Lab 05/27/19 1622 05/28/19 0432 05/29/19 0436  WBC 13.7* 10.2 7.8  NEUTROABS  --  8.6* 5.8  HGB 14.2 13.1 12.6  HCT 42.9 40.1 38.5  MCV 88.5 89.1 90.0  PLT 277 227 [id number]   Basic Metabolic Panel: Recent Labs  Lab 05/27/19 1622 05/28/19 0432 05/29/19 0436  NA 137 137 140  K 3.5 3.3* 3.6  CL 98 101 105  CO2 28 26 25   GLUCOSE 155* 107* 119*  BUN 23 21 17   CREATININE 0.78 0.86 0.83  CALCIUM 9.2 8.5* 8.4*  MG  --  1.9  --    GFR: Estimated Creatinine Clearance: 61.1 mL/min (by C-G formula based on SCr of 0.83 mg/dL). Liver Function Tests: Recent Labs  Lab 05/27/19 1622 05/28/19 0432 05/29/19 0436  AST 866* 1,223* 374*  ALT 540* 1,279* 675*  ALKPHOS 190* 199* 169*  BILITOT 3.0* 4.7* 3.4*  PROT 6.8 6.2* 6.0*  ALBUMIN 4.0 3.6 3.4*   Recent Labs  Lab 05/27/19 1622  LIPASE 69*   No results for input(s): AMMONIA in the last 168 hours. Coagulation Profile: Recent Labs  Lab 05/28/19 0432  INR 1.0   Cardiac Enzymes: No results for input(s): CKTOTAL, CKMB, CKMBINDEX, TROPONINI in the last 168 hours. BNP (last 3 results) No results for input(s): PROBNP in the last 8760 hours. HbA1C: No results for input(s): HGBA1C in the last 72 hours. CBG: No results for input(s): GLUCAP in the last 168 hours. Lipid Profile: No results for input(s): CHOL, HDL, LDLCALC, TRIG, CHOLHDL, LDLDIRECT in the last 72 hours. Thyroid Function Tests: No results for input(s): TSH, T4TOTAL,  FREET4, T3FREE, THYROIDAB in the last 72 hours. Anemia Panel: No results for input(s): VITAMINB12, FOLATE, FERRITIN, TIBC, IRON, RETICCTPCT in the last 72 hours.    Radiology Studies: I have reviewed all of the imaging during this hospital visit personally     Scheduled Meds: . budesonide-formoterol  2 puff Inhalation BID  . sodium chloride flush  3 mL Intravenous Q12H   Continuous Infusions: . sodium chloride 75 mL/hr at 05/29/19 0536  . ciprofloxacin 400 mg (05/29/19 0539)  . metronidazole 500 mg (05/29/19 0538)     LOS: 2 days        Tylea Hise Gerome Apley, MD

## 2019-05-29 NOTE — Op Note (Signed)
[facility] Patient Name: Rachel Chase Procedure Date: 05/29/2019 MRN: [id number] Attending MD: Clarene Essex , MD Date of Birth: [birth year] CSN: [id number] Age: 79 Admit Type: Inpatient Procedure:                ERCP Indications:              Bile duct stone(s), For therapy of bile duct                            stone(s) Providers:                Clarene Essex, MD, Carlyn Reichert, RN, Elspeth Cho                            Tech., Technician, Anne Fu CRNA, CRNA Referring MD:              Medicines:                General Anesthesia Complications:            No immediate complications. Estimated blood loss:                            Minimal. Estimated Blood Loss:     Estimated blood loss was minimal. Procedure:                Pre-Anesthesia Assessment:                           - Prior to the procedure, a History and Physical                            was performed, and patient medications and                            allergies were reviewed. The patient's tolerance of                            previous anesthesia was also reviewed. The risks                            and benefits of the procedure and the sedation                            options and risks were discussed with the patient.                            All questions were answered, and informed consent                            was obtained. Prior Anticoagulants: The patient has                            taken no previous anticoagulant or antiplatelet                            agents. ASA  Grade Assessment: II - A patient with                            mild systemic disease. After reviewing the risks                            and benefits, the patient was deemed in                            satisfactory condition to undergo the procedure.                           After obtaining informed consent, the scope was                            passed under direct vision. Throughout the                        procedure, the patient's blood pressure, pulse, and                            oxygen saturations were monitored continuously. The                            [id number]) Olympus was introduced through                            the mouth, and used to inject contrast into and                            used to cannulate the bile duct. The ERCP was                            technically difficult and complex due to                            challenging cannulation because of abnormal anatomy                            it was very difficult to find the papilla and it                            was about at 5 or 6:00 a short ways from the rim of                            the diverticulum. The patient tolerated the                            procedure well. Scope In: Scope Out: Findings:      The major papilla was adjacent to a diverticulum. The major papilla was       flat. After a prolonged effort deep selective cannulation was obtained       and there was no pancreatic duct injection or wire  advancement and       biliary sphincterotomy was made with a Hydratome sphincterotome using       ERBE electrocautery. There was self limited oozing from the       sphincterotomy which did not require treatment. We stopped the       sphincterotomy when we saw the using and we could still get the fully       bowed sphincterotome easily in and out of the duct and initially to       discover objects, the biliary tree was swept with a 12-15 mm adjustable       balloon starting at the bifurcation. Sludge was swept from the duct. But       we could not pull the 12 mm balloon easily through the patent       sphincterotomy site so we proceeded with. Dilation of the common bile       duct and ampulla with a 4 cm by 8 mm balloon dilator which was       successful. The biliary tree was swept with a 9 mm -12 millimeters       adjustable balloon starting at the bifurcation. Sludge was  swept from       the duct. All stones were removed. The 9 mm balloon on the first       pull-through delivered all of the stones and we proceeded with multiple       9 and 12 mm pull-through's and both balloons passed readily through the       patent sphincterotomy site and we proceeded with an occlusion       cholangiogram in the customary fashion which was normal and again on the       last pull-through no residual stones were found. 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                           - The major papilla appeared to be flat.                           - Choledocholithiasis was found. Complete removal                            was accomplished by biliary sphincterotomy and                            balloon extraction.                           - A biliary sphincterotomy was performed.                           -  The biliary tree was swept.                           - Common bile duct was successfully dilated.                           - The biliary tree was swept at the end of the                            procedure and nothing was found. And there was no                            pancreatic duct injection or wire advancement                            throughout the procedure Moderate Sedation:      Not Applicable - Patient had care per Anesthesia. Recommendation:           - Clear liquid diet today.                           - NPO After midnight for possible lap chole                            tomorrow.                           - Continue present  medications.                           - Return to GI clinic PRN.                           - Telephone GI clinic if symptomatic PRN.                           - Check liver enzymes (AST, ALT, alkaline                            phosphatase, bilirubin) and hemogram with white                            blood cell count and platelets in the morning.                            Follow liver tests back to normal as an outpatient Procedure Code(s):        --- Professional ---                           [phone], 59, Endoscopic retrograde                            cholangiopancreatography (ERCP); with                            trans-endoscopic  balloon dilation of                            biliary/pancreatic duct(s) or of ampulla                            (sphincteroplasty), including sphincterotomy, when                            performed, each duct                           [id number], Endoscopic retrograde                            cholangiopancreatography (ERCP); with removal of                            calculi/debris from biliary/pancreatic duct(s) Diagnosis Code(s):        --- Professional ---                           K80.50, Calculus of bile duct without cholangitis                            or cholecystitis without obstruction                           K83.8, Other specified diseases of biliary tract CPT copyright 2019 [employer]. All rights reserved. The codes documented in this report are preliminary and upon coder review may  be revised to meet current compliance requirements. Clarene Essex, MD 05/29/2019 4:34:14 PM This report has been signed electronically. Number of Addenda: 0

## 2019-05-29 NOTE — Care Management Important Message (Signed)
Important Message  Patient Details IM Letter given to Kathrin Greathouse SW Case Manager to present to the Patient Name: DELMARIE TRABER MRN: [id number] Date of Birth: [birth year]   Medicare Important Message Given:  Yes     Kerin Salen 05/29/2019, 10:22 AM

## 2019-05-30 ENCOUNTER — Inpatient Hospital Stay (HOSPITAL_COMMUNITY): Payer: Medicare Other | Admitting: Certified Registered"

## 2019-05-30 ENCOUNTER — Encounter (HOSPITAL_COMMUNITY)
Admission: EM | Disposition: A | Discharge status: Home / Self Care | Payer: Self-pay | Source: Home / Self Care | Attending: Internal Medicine

## 2019-05-30 ENCOUNTER — Encounter (HOSPITAL_COMMUNITY): Payer: Self-pay | Admitting: Internal Medicine

## 2019-05-30 HISTORY — PX: CHOLECYSTECTOMY: SHX55

## 2019-05-30 LAB — CBC WITH DIFFERENTIAL/PLATELET
Abs Immature Granulocytes: 0.02 10*3/uL (ref 0.00–0.07)
Basophils Absolute: 0 10*3/uL (ref 0.0–0.1)
Basophils Relative: 1 %
Eosinophils Absolute: 0.2 10*3/uL (ref 0.0–0.5)
Eosinophils Relative: 3 %
HCT: 34.1 % — ABNORMAL LOW (ref 36.0–46.0)
Hemoglobin: 10.7 g/dL — ABNORMAL LOW (ref 12.0–15.0)
Immature Granulocytes: 0 %
Lymphocytes Relative: 16 %
Lymphs Abs: 0.9 10*3/uL (ref 0.7–4.0)
MCH: 28.8 pg (ref 26.0–34.0)
MCHC: 31.4 g/dL (ref 30.0–36.0)
MCV: 91.7 fL (ref 80.0–100.0)
Monocytes Absolute: 0.6 10*3/uL (ref 0.1–1.0)
Monocytes Relative: 10 %
Neutro Abs: 4 10*3/uL (ref 1.7–7.7)
Neutrophils Relative %: 70 %
Platelets: 169 10*3/uL (ref 150–400)
RBC: 3.72 MIL/uL — ABNORMAL LOW (ref 3.87–5.11)
RDW: 13.8 % (ref 11.5–15.5)
WBC: 5.7 10*3/uL (ref 4.0–10.5)
nRBC: 0 % (ref 0.0–0.2)

## 2019-05-30 LAB — COMPREHENSIVE METABOLIC PANEL
ALT: 366 U/L — ABNORMAL HIGH (ref 0–44)
AST: 131 U/L — ABNORMAL HIGH (ref 15–41)
Albumin: 2.8 g/dL — ABNORMAL LOW (ref 3.5–5.0)
Alkaline Phosphatase: 133 U/L — ABNORMAL HIGH (ref 38–126)
Anion gap: 6 (ref 5–15)
BUN: 11 mg/dL (ref 8–23)
CO2: 26 mmol/L (ref 22–32)
Calcium: 7.9 mg/dL — ABNORMAL LOW (ref 8.9–10.3)
Chloride: 110 mmol/L (ref 98–111)
Creatinine, Ser: 0.73 mg/dL (ref 0.44–1.00)
GFR calc Af Amer: >60 mL/min (ref >60)
GFR calc non Af Amer: >60 mL/min (ref >60)
Glucose, Bld: 105 mg/dL — ABNORMAL HIGH (ref 70–99)
Potassium: 3.4 mmol/L — ABNORMAL LOW (ref 3.5–5.1)
Sodium: 142 mmol/L (ref 135–145)
Total Bilirubin: 1.6 mg/dL — ABNORMAL HIGH (ref 0.3–1.2)
Total Protein: 5 g/dL — ABNORMAL LOW (ref 6.5–8.1)

## 2019-05-30 LAB — MRSA PCR SCREENING: MRSA by PCR: NEGATIVE

## 2019-05-30 SURGERY — LAPAROSCOPIC CHOLECYSTECTOMY WITH INTRAOPERATIVE CHOLANGIOGRAM
Anesthesia: General | Site: Abdomen

## 2019-05-30 MED ORDER — ROCURONIUM BROMIDE 10 MG/ML (PF) SYRINGE
PREFILLED_SYRINGE | INTRAVENOUS | Status: DC | PRN
  Administered 2019-05-30: 30 mg via INTRAVENOUS

## 2019-05-30 MED ORDER — PROPOFOL 10 MG/ML IV BOLUS
INTRAVENOUS | Status: AC
  Filled 2019-05-30: qty 20

## 2019-05-30 MED ORDER — CHLORHEXIDINE GLUCONATE CLOTH 2 % EX PADS: 6.0000 | MEDICATED_PAD | Freq: Once | CUTANEOUS | Status: DC

## 2019-05-30 MED ORDER — DEXAMETHASONE SODIUM PHOSPHATE 10 MG/ML IJ SOLN
INTRAMUSCULAR | Status: DC | PRN
  Administered 2019-05-30: 5 mg via INTRAVENOUS

## 2019-05-30 MED ORDER — FENTANYL CITRATE (PF) 250 MCG/5ML IJ SOLN
INTRAMUSCULAR | Status: AC
  Filled 2019-05-30: qty 5

## 2019-05-30 MED ORDER — SUGAMMADEX SODIUM 500 MG/5ML IV SOLN
INTRAVENOUS | Status: DC | PRN
  Administered 2019-05-30: 250 mg via INTRAVENOUS

## 2019-05-30 MED ORDER — PROPOFOL 10 MG/ML IV BOLUS
INTRAVENOUS | Status: DC | PRN
  Administered 2019-05-30: 100 mg via INTRAVENOUS

## 2019-05-30 MED ORDER — LIDOCAINE 2% (20 MG/ML) 5 ML SYRINGE
INTRAMUSCULAR | Status: DC | PRN
  Administered 2019-05-30: 60 mg via INTRAVENOUS

## 2019-05-30 MED ORDER — BUPIVACAINE HCL 0.25 % IJ SOLN
INTRAMUSCULAR | Status: AC
  Filled 2019-05-30: qty 1

## 2019-05-30 MED ORDER — SUCCINYLCHOLINE CHLORIDE 200 MG/10ML IV SOSY
PREFILLED_SYRINGE | INTRAVENOUS | Status: DC | PRN
  Administered 2019-05-30: 120 mg via INTRAVENOUS

## 2019-05-30 MED ORDER — LACTATED RINGERS IV SOLN: INTRAVENOUS | Status: DC | PRN

## 2019-05-30 MED ORDER — ONDANSETRON HCL 4 MG/2ML IJ SOLN
INTRAMUSCULAR | Status: DC | PRN
  Administered 2019-05-30: 4 mg via INTRAVENOUS

## 2019-05-30 MED ORDER — LABETALOL HCL 5 MG/ML IV SOLN
INTRAVENOUS | Status: DC | PRN
  Administered 2019-05-30: 5 mg via INTRAVENOUS

## 2019-05-30 MED ORDER — SUGAMMADEX SODIUM 500 MG/5ML IV SOLN: INTRAVENOUS | Status: DC | PRN

## 2019-05-30 MED ORDER — PROMETHAZINE HCL 25 MG/ML IJ SOLN
INTRAMUSCULAR | Status: AC
  Filled 2019-05-30: qty 1

## 2019-05-30 MED ORDER — OXYCODONE-ACETAMINOPHEN 5-325 MG PO TABS
1.0000 | ORAL_TABLET | ORAL | Status: DC | PRN
  Administered 2019-05-30: 1 via ORAL
  Filled 2019-05-30: qty 1

## 2019-05-30 MED ORDER — STERILE WATER FOR IRRIGATION IR SOLN
Status: DC | PRN
  Administered 2019-05-30: 2000 mL

## 2019-05-30 MED ORDER — CLINDAMYCIN PHOSPHATE 900 MG/50ML IV SOLN
INTRAVENOUS | Status: AC
  Filled 2019-05-30: qty 50

## 2019-05-30 MED ORDER — BUPIVACAINE HCL 0.25 % IJ SOLN
INTRAMUSCULAR | Status: DC | PRN
  Administered 2019-05-30: 10 mL

## 2019-05-30 MED ORDER — 0.9 % SODIUM CHLORIDE (POUR BTL) OPTIME
TOPICAL | Status: DC | PRN
  Administered 2019-05-30: 09:00:00 1000 mL

## 2019-05-30 MED ORDER — PROMETHAZINE HCL 25 MG/ML IJ SOLN
6.2500 mg | Freq: Once | INTRAMUSCULAR | Status: AC
  Administered 2019-05-30: 6.25 mg via INTRAVENOUS

## 2019-05-30 MED ORDER — LACTATED RINGERS IV SOLN
INTRAVENOUS | Status: AC | PRN
  Administered 2019-05-30: 2000 mL

## 2019-05-30 MED ORDER — FENTANYL CITRATE (PF) 100 MCG/2ML IJ SOLN: 25.0000 ug | INTRAMUSCULAR | Status: DC | PRN

## 2019-05-30 MED ORDER — ACETAMINOPHEN 325 MG PO TABS
650.0000 mg | ORAL_TABLET | Freq: Four times a day (QID) | ORAL | Status: DC | PRN
  Administered 2019-05-30 – 2019-05-31 (×2): 650 mg via ORAL
  Filled 2019-05-30 (×2): qty 2

## 2019-05-30 MED ORDER — CLINDAMYCIN PHOSPHATE 900 MG/50ML IV SOLN
900.0000 mg | INTRAVENOUS | Status: AC
  Administered 2019-05-30: 09:00:00 900 mg via INTRAVENOUS
  Filled 2019-05-30: qty 50

## 2019-05-30 MED ORDER — FENTANYL CITRATE (PF) 250 MCG/5ML IJ SOLN
INTRAMUSCULAR | Status: DC | PRN
  Administered 2019-05-30 (×5): 50 ug via INTRAVENOUS

## 2019-05-30 SURGICAL SUPPLY — 40 items
ADH SKN CLS APL DERMABOND .7 (GAUZE/BANDAGES/DRESSINGS)
APL PRP STRL LF DISP 70% ISPRP (MISCELLANEOUS) ×1
APPLIER CLIP ROT 10 11.4 M/L (STAPLE) ×3
APR CLP MED LRG 11.4X10 (STAPLE) ×1
BAG SPEC RTRVL LRG 6X4 10 (ENDOMECHANICALS)
CHLORAPREP W/TINT 26 (MISCELLANEOUS) ×3 IMPLANT
CLIP APPLIE ROT 10 11.4 M/L (STAPLE) ×1 IMPLANT
COVER MAYO STAND STRL (DRAPES) ×3 IMPLANT
COVER WAND RF STERILE (DRAPES) IMPLANT
DECANTER SPIKE VIAL GLASS SM (MISCELLANEOUS) ×3 IMPLANT
DERMABOND ADVANCED (GAUZE/BANDAGES/DRESSINGS)
DERMABOND ADVANCED .7 DNX12 (GAUZE/BANDAGES/DRESSINGS) IMPLANT
DRAPE C-ARM 42X120 X-RAY (DRAPES) ×1 IMPLANT
DRAPE UTILITY XL STRL (DRAPES) ×3 IMPLANT
DRAPE WARM FLUID 44X44 (DRAPES) ×3 IMPLANT
ELECT REM PT RETURN 15FT ADLT (MISCELLANEOUS) ×3 IMPLANT
GLOVE INDICATOR 8.0 STRL GRN (GLOVE) ×3 IMPLANT
GLOVE SS BIOGEL STRL SZ 7.5 (GLOVE) ×1 IMPLANT
GLOVE SUPERSENSE BIOGEL SZ 7.5 (GLOVE) ×2
GOWN STRL REUS W/TWL XL LVL3 (GOWN DISPOSABLE) ×6 IMPLANT
HEMOSTAT SNOW SURGICEL 2X4 (HEMOSTASIS) ×4 IMPLANT
HEMOSTAT SURGICEL 4X8 (HEMOSTASIS) IMPLANT
KIT BASIN OR (CUSTOM PROCEDURE TRAY) ×1 IMPLANT
KIT TURNOVER KIT A (KITS) IMPLANT
PENCIL SMOKE EVACUATOR (MISCELLANEOUS) IMPLANT
POUCH SPECIMEN RETRIEVAL 10MM (ENDOMECHANICALS) ×1 IMPLANT
PROTECTOR NERVE ULNAR (MISCELLANEOUS) IMPLANT
SCISSORS LAP 5X35 DISP (ENDOMECHANICALS) IMPLANT
SET CHOLANGIOGRAPH MIX (MISCELLANEOUS) ×1 IMPLANT
SET IRRIG TUBING LAPAROSCOPIC (IRRIGATION / IRRIGATOR) ×3 IMPLANT
SET TUBE SMOKE EVAC HIGH FLOW (TUBING) IMPLANT
SLEEVE XCEL OPT CAN 5 100 (ENDOMECHANICALS) ×3 IMPLANT
SUT MNCRL AB 4-0 PS2 18 (SUTURE) ×3 IMPLANT
TAPE CLOTH 4X10 WHT NS (GAUZE/BANDAGES/DRESSINGS) IMPLANT
TOWEL OR 17X26 10 PK STRL BLUE (TOWEL DISPOSABLE) ×3 IMPLANT
TOWEL OR NON WOVEN STRL DISP B (DISPOSABLE) ×3 IMPLANT
TRAY LAPAROSCOPIC (CUSTOM PROCEDURE TRAY) ×3 IMPLANT
TROCAR BLADELESS OPT 5 100 (ENDOMECHANICALS) ×3 IMPLANT
TROCAR XCEL BLUNT TIP 100MML (ENDOMECHANICALS) ×3 IMPLANT
TROCAR XCEL NON-BLD 11X100MML (ENDOMECHANICALS) ×3 IMPLANT

## 2019-05-30 NOTE — Anesthesia Procedure Notes (Signed)
Date/Time: 05/30/2019 9:23 AM Performed by: Cynda Familia, CRNA Oxygen Delivery Method: Simple face mask Placement Confirmation: positive ETCO2 and breath sounds checked- equal and bilateral Dental Injury: Teeth and Oropharynx as per pre-operative assessment

## 2019-05-30 NOTE — Progress Notes (Signed)
Richardo Hanks Gargis 11:30 AM  Subjective: Patient doing well status post ERCP and lap chole without any obvious problems from either and her case discussed with the hospital team and no new complaints  Objective: Vital signs stable afebrile patient looks well not examined today liver test improved  Assessment: Status post ERCP sphincterotomy stone extraction and lap chole  Plan: Probably will go home tomorrow please call me if I can be of any further assistance with this hospital stay otherwise we will plan on rechecking liver test end of next week and make sure they return to normal  [facility]  office [phone] After 5PM or if no answer call [phone]

## 2019-05-30 NOTE — Anesthesia Procedure Notes (Signed)
Procedure Name: Intubation Date/Time: 05/30/2019 8:29 AM Performed by: Cynda Familia, CRNA Pre-anesthesia Checklist: Patient identified, Emergency Drugs available, Suction available and Patient being monitored Patient Re-evaluated:Patient Re-evaluated prior to induction Oxygen Delivery Method: Circle System Utilized Preoxygenation: Pre-oxygenation with 100% oxygen Induction Type: IV induction Ventilation: Mask ventilation without difficulty Laryngoscope Size: Glidescope Grade View: Grade I Tube type: Oral Tube size: 7.0 mm Number of attempts: 1 Airway Equipment and Method: Stylet Placement Confirmation: ETT inserted through vocal cords under direct vision,  positive ETCO2 and breath sounds checked- equal and bilateral Secured at: 21 cm Tube secured with: Tape Dental Injury: Teeth and Oropharynx as per pre-operative assessment  Comments: Smooth RSI per Glennon Mac-- intubation AM CRNA atraumatic -- teeth and mouth as preop  bilat BS Jackson-- Glidescope used as pt complains of sore throat from yesterdays procedure-- intubation note reports Grade 3 view

## 2019-05-30 NOTE — Progress Notes (Addendum)
PROGRESS NOTE    Rachel Chase  [id number] DOB: 15-Oct-1940 DOA: 05/27/2019 PCP: Burnard Bunting, MD    Brief Narrative:  Patient was admitted to the hospital with working diagnosis of acute cholecystitis complicated with choledocholithiasis, now sp ERCP and cholecystectomy.   79 year old female who presented with nausea and vomiting. She does have significant past medical history for coronary artery disease status post PCI, 2012, hypertension, dyslipidemia and anxiety. She was transferred from [facility] ED for further management of acute cholecystitis. Patient reported postpandrialright upper quadrant abdominal discomfort for about 6 months, worsening over the last 3 to 4 days, associated with nausea and vomiting. On his initial physical examination blood pressure 127/58, heart rate 102, respiratory rate 24, temperature 98.3, oxygen saturation 97%, her lungs are clear to auscultation bilaterally, heart S1-S2 present rhythmic, her abdomen was soft, positive tenderness to palpation right upper quadrant, negative Murphy sign, no guarding or rigidity, no lower extremity edema. Sodium 137, potassium 3.5, chloride 98, bicarb 28, glucose 155, BUN 33, creatinine 0.78, alkaline phosphatase 190, lipase 69, AST 866, ALT 540, total bilirubins 3.0, white count 13.7, hemoglobin 14.2, hematocrit 42.9, platelets 277. SARS COVID-19 negative, urinalysis 0-5 white cells. Abdominal ultrasound with multiple small gallstones with gallbladder wall thickening, dilated common bile duct, no evidence of choledocholithiasis. EKG 80 bpm, normal axis, normal intervals, sinus rhythm, no ST segment T wave changes.  Further work up with MRCP, positive for cholelithiasis with choledocholithiasis, common bile duct dilatation at 7 mm. Patient underwenr ERCP and stone was removed, with good toleration.   Today patient underwent cholecystectomy.    Assessment & Plan:   Principal Problem:   Acute  cholecystitis Active Problems:   Hyperlipemia   Abdominal pain   Nausea & vomiting   Hypertension   1. Acute cholecystitis to rule out choledocholithiasis/ sp ERCP and cholecyestectomy. Lliver profile with liver enzymes trending down: AST 131, ALT from 366, T Bil down to 1,6.  Patient tolerated well ERCP with stone extraction, this am underwent cholecystectomy with no mayor complications. Continue pain control and advance diet per surgery recommendations. Will discontinue IV fluids and will continue with antibiotic therapy for now with ciprofloxacin and metronidazole.   Out of bed to chair.   2. HTN. Stable blood pressure and will continue to hold on antihypertensive medications, for now.  3. Dyslipidemia.  Continue with statin therapy.    4. COPD. On inhaled corticosteroids and bronchodilators.No clinical signs of exacerbation  DVT prophylaxis:enoxaparin Code Status:full Family Communication:no family at the bedside Disposition Plan/ discharge barriers:patient from home, barrier post operative, plan for home in am.   Consultants:  Surgery   GI   Subjective: Patient is feeling better, mild abdominal distention and abdominal pain post op, denies any dyspnea or chest pain, no nausea or vomiting.    Objective: Vitals:   05/30/19 0945 05/30/19 1000 05/30/19 1006 05/30/19 1018  BP: (!) 152/73 (!) 145/76 (!) 149/77 (!) 146/71  Pulse: 88 85 84   Resp: (!) 21 17 19 18   Temp:   97.6 F (36.4 C) (!) 97.4 F (36.3 C)  TempSrc:    Oral  SpO2: 99% 98% 97% 98%  Weight:      Height:        Intake/Output Summary (Last 24 hours) at 05/30/2019 1033 Last data filed at 05/30/2019 0925 Gross per 24 hour  Intake 3802.55 ml  Output 60 ml  Net 3742.55 ml   Filed Weights   05/28/19 0500 05/29/19 1352 05/30/19 0500  Weight: 80.1 kg 79.4 kg 83.8 kg    Examination:   General: Not in pain or dyspnea Neurology: Awake and alert, non focal  E ENT: mild pallor, no  icterus, oral mucosa moist Cardiovascular: No JVD. S1-S2 present, rhythmic, no gallops, rubs, or murmurs. No lower extremity edema. Pulmonary: positive breath sounds bilaterally, adequate air movement, no wheezing, rhonchi or rales. Gastrointestinal. Abdomen with no organomegaly, non tender, no rebound or guarding. Mild distended but soft. Skin. No rashes Musculoskeletal: no joint deformities     Data Reviewed: I have personally reviewed following labs and imaging studies  CBC: Recent Labs  Lab 05/27/19 1622 05/28/19 0432 05/29/19 0436 05/30/19 0441  WBC 13.7* 10.2 7.8 5.7  NEUTROABS  --  8.6* 5.8 4.0  HGB 14.2 13.1 12.6 10.7*  HCT 42.9 40.1 38.5 34.1*  MCV 88.5 89.1 90.0 91.7  PLT 277 227 185 [id number]   Basic Metabolic Panel: Recent Labs  Lab 05/27/19 1622 05/28/19 0432 05/29/19 0436 05/30/19 0441  NA 137 137 140 142  K 3.5 3.3* 3.6 3.4*  CL 98 101 105 110  CO2 28 26 25 26   GLUCOSE 155* 107* 119* 105*  BUN 23 21 17 11   CREATININE 0.78 0.86 0.83 0.73  CALCIUM 9.2 8.5* 8.4* 7.9*  MG  --  1.9  --   --    GFR: Estimated Creatinine Clearance: 64.7 mL/min (by C-G formula based on SCr of 0.73 mg/dL). Liver Function Tests: Recent Labs  Lab 05/27/19 1622 05/28/19 0432 05/29/19 0436 05/30/19 0441  AST 866* 1,223* 374* 131*  ALT 540* 1,279* 675* 366*  ALKPHOS 190* 199* 169* 133*  BILITOT 3.0* 4.7* 3.4* 1.6*  PROT 6.8 6.2* 6.0* 5.0*  ALBUMIN 4.0 3.6 3.4* 2.8*   Recent Labs  Lab 05/27/19 1622  LIPASE 69*   No results for input(s): AMMONIA in the last 168 hours. Coagulation Profile: Recent Labs  Lab 05/28/19 0432  INR 1.0   Cardiac Enzymes: No results for input(s): CKTOTAL, CKMB, CKMBINDEX, TROPONINI in the last 168 hours. BNP (last 3 results) No results for input(s): PROBNP in the last 8760 hours. HbA1C: No results for input(s): HGBA1C in the last 72 hours. CBG: No results for input(s): GLUCAP in the last 168 hours. Lipid Profile: No results for  input(s): CHOL, HDL, LDLCALC, TRIG, CHOLHDL, LDLDIRECT in the last 72 hours. Thyroid Function Tests: No results for input(s): TSH, T4TOTAL, FREET4, T3FREE, THYROIDAB in the last 72 hours. Anemia Panel: No results for input(s): VITAMINB12, FOLATE, FERRITIN, TIBC, IRON, RETICCTPCT in the last 72 hours.    Radiology Studies: I have reviewed all of the imaging during this hospital visit personally     Scheduled Meds: . budesonide-formoterol  2 puff Inhalation BID  . promethazine      . sodium chloride flush  3 mL Intravenous Q12H   Continuous Infusions: . sodium chloride 75 mL/hr at 05/30/19 1020  . ciprofloxacin 400 mg (05/30/19 0513)  . metronidazole 500 mg (05/30/19 0512)     LOS: 3 days         Gerome Apley, MD

## 2019-05-30 NOTE — Interval H&P Note (Signed)
History and Physical Interval Note:  05/30/2019 8:20 AM  Rachel Chase  has presented today for surgery, with the diagnosis of cholecystitis.  The various methods of treatment have been discussed with the patient and family. After consideration of risks, benefits and other options for treatment, the patient has consented to  Procedure(s): LAPAROSCOPIC CHOLECYSTECTOMY WITH INTRAOPERATIVE CHOLANGIOGRAM (N/A) as a surgical intervention.  The patient's history has been reviewed, patient examined, no change in status, stable for surgery.  I have reviewed the patient's chart and labs.  Questions were answered to the patient's satisfaction.     [place]

## 2019-05-30 NOTE — Transfer of Care (Signed)
Immediate Anesthesia Transfer of Care Note  Patient: Rachel Chase  Procedure(s) Performed: LAPAROSCOPIC CHOLECYSTECTOMY (N/A Abdomen)  Patient Location: PACU  Anesthesia Type:General  Level of Consciousness: awake and alert   Airway & Oxygen Therapy: Patient Spontanous Breathing and Patient connected to face mask oxygen  Post-op Assessment: Report given to RN and Post -op Vital signs reviewed and stable  Post vital signs: Reviewed and stable  Last Vitals:  Vitals Value Taken Time  BP 162/90 05/30/19 0930  Temp    Pulse 89 05/30/19 0930  Resp 17 05/30/19 0930  SpO2 100 % 05/30/19 0930  Vitals shown include unvalidated device data.  Last Pain:  Vitals:   05/29/19 2106  TempSrc: Oral  PainSc:          Complications: No apparent anesthesia complications

## 2019-05-30 NOTE — Op Note (Signed)
Laparoscopic Cholecystectomy  Procedure Note  Indications: This patient presents with symptomatic gallbladder disease and will undergo laparoscopic cholecystectomy.  Patient found to have common duct stones and underwent ERCP yesterday with clearance of her common bile duct and sphincterotomy.  She presents today for laparoscopic cholecystectomy for cholelithiasis and choledocholithiasis.The procedure has been discussed with the patient. Operative and non operative treatments have been discussed. Risks of surgery include bleeding, infection,  Common bile duct injury,  Injury to the stomach,liver, colon,small intestine, abdominal wall,  Diaphragm,  Major blood vessels,  And the need for an open procedure.  Other risks include worsening of medical problems, death,  DVT and pulmonary embolism, and cardiovascular events.   Medical options have also been discussed. The patient has been informed of long term expectations of surgery and non surgical options,  The patient agrees to proceed.    Pre-operative Diagnosis: Calculus of gallbladder and bile duct without cholecystitis, with obstruction  Post-operative Diagnosis: Same  Surgeon: Turner Daniels MD  Assistants: Rosendo Gros MD   Anesthesia: General endotracheal anesthesia and Local anesthesia 0.25.% bupivacaine, with epinephrine  ASA Class: 3  Procedure Details  The patient was seen again in the Holding Room. The risks, benefits, complications, treatment options, and expected outcomes were discussed with the patient. The possibilities of reaction to medication, pulmonary aspiration, perforation of viscus, bleeding, recurrent infection, finding a normal gallbladder, the need for additional procedures, failure to diagnose a condition, the possible need to convert to an open procedure, and creating a complication requiring transfusion or operation were discussed with the patient. The patient and/or family concurred with the proposed plan, giving informed  consent. The site of surgery properly noted/marked. The patient was taken to Operating Room, identified as Rachel Chase and the procedure verified as Laparoscopic Cholecystectomy with Intraoperative Cholangiograms. A Time Out was held and the above information confirmed.  Prior to the induction of general anesthesia, antibiotic prophylaxis was administered. General endotracheal anesthesia was then administered and tolerated well. After the induction, the abdomen was prepped in the usual sterile fashion. The patient was positioned in the supine position with the left arm comfortably tucked, along with some reverse Trendelenburg.  Local anesthetic agent was injected into the skin near the umbilicus and an incision made. The midline fascia was incised and the Hasson technique was used to introduce a 12 mm port under direct vision. It was secured with a figure of eight Vicryl suture placed in the usual fashion. Pneumoperitoneum was then created with CO2 and tolerated well without any adverse changes in the patient's vital signs. Additional trocars were introduced under direct vision with an 11 mm trocar in the epigastrium and 2 5 mm trocars in the right upper quadrant. All skin incisions were infiltrated with a local anesthetic agent before making the incision and placing the trocars.   The gallbladder was identified, the fundus grasped and retracted cephalad. Adhesions were lysed bluntly and with the electrocautery where indicated, taking care not to injure any adjacent organs or viscus. The infundibulum was grasped and retracted laterally, exposing the peritoneum overlying the triangle of Calot. This was then divided and exposed in a blunt fashion. The cystic duct was clearly identified and bluntly dissected circumferentially. The junctions of the gallbladder, cystic duct and common bile duct were clearly identified prior to the division of any linear structure.   The critical view was obtained.  All  structures were defined.  The cystic duct was quite small.  Given that she had previous ERCP,  sphincterotomy and clearance of stones, and cholangiogram was not repeated today.  The cystic duct was then  ligated with surgical clips  on the patient side and  clipped on the gallbladder side and divided. The cystic artery was identified, dissected free, ligated with clips and divided as well. Posterior cystic artery clipped and divided.  The gallbladder was dissected from the liver bed in retrograde fashion with the electrocautery. The gallbladder was removed. The liver bed was irrigated and inspected. Hemostasis was achieved with the electrocautery.  Surgicel snow placed due to fragile condition of liver.  No signs of cirrhosis or fatty infiltration but very thin capsule noted.  Copious irrigation was utilized and was repeatedly aspirated until clear all particulate matter. Hemostasis was achieved with no signs of bleeding or bile leakage.  Pneumoperitoneum was completely reduced after viewing removal of the trocars under direct vision. The wound was thoroughly irrigated and the fascia was then closed with a figure of eight suture; the skin was then closed with 4 O MONOCRYL  and a sterile dressing was applied.  Instrument, sponge, and needle counts were correct at closure and at the conclusion of the case.   Findings:  Cholelithiasis  Estimated Blood Loss: less than 50 mL         Drains: None         Total IV Fluids: Per anesthesia record         Specimens: Gallbladder           Complications: None; patient tolerated the procedure well.         Disposition: PACU - hemodynamically stable.         Condition: stable

## 2019-05-30 NOTE — Anesthesia Postprocedure Evaluation (Signed)
Anesthesia Post Note  Patient: Rachel Chase  Procedure(s) Performed: LAPAROSCOPIC CHOLECYSTECTOMY (N/A Abdomen)     Patient location during evaluation: PACU Anesthesia Type: General Level of consciousness: awake and alert, oriented and patient cooperative Pain management: pain level controlled Vital Signs Assessment: post-procedure vital signs reviewed and stable Respiratory status: spontaneous breathing, nonlabored ventilation and respiratory function stable Cardiovascular status: blood pressure returned to baseline and stable Postop Assessment: no apparent nausea or vomiting Anesthetic complications: no    Last Vitals:  Vitals:   05/30/19 1103 05/30/19 1223  BP: 130/81 (!) 157/81  Pulse: 85 100  Resp:  16  Temp: (!) 36.3 C 36.7 C  SpO2: 96% 95%    Last Pain:  Vitals:   05/30/19 1146  TempSrc:   PainSc: 3                  Bobak Oguinn,E. Kelen Laura

## 2019-05-30 NOTE — Anesthesia Preprocedure Evaluation (Signed)
Anesthesia Evaluation  Patient identified by MRN, date of birth, ID band Patient awake    Reviewed: Allergy & Precautions, NPO status , Patient's Chart, lab work & pertinent test results  History of Anesthesia Complications Negative for: history of anesthetic complications  Airway Mallampati: II  TM Distance: >3 FB Neck ROM: Full    Dental  (+) Caps, Dental Advisory Given   Pulmonary COPD,  COPD inhaler, former smoker,  05/27/2019 SARS coronavirus NEG   breath sounds clear to auscultation       Cardiovascular hypertension, Pt. on medications and Pt. on home beta blockers + CAD and + Past MI   Rhythm:Regular Rate:Normal  '18 stress: Normal Lexiscan nuclear stress test with no evidence for prior infarct or ischemia, EF 71% '18 ECHO: EF 55-60%, mild MR   Neuro/Psych Anxiety negative neurological ROS     GI/Hepatic Elevated LFTs: acute chole   Endo/Other    Renal/GU      Musculoskeletal  (+) Arthritis ,   Abdominal   Peds  Hematology   Anesthesia Other Findings Breast cancer: XRT  Reproductive/Obstetrics                             Anesthesia Physical Anesthesia Plan  ASA: III  Anesthesia Plan: General   Post-op Pain Management:    Induction: Intravenous  PONV Risk Score and Plan: 4 or greater and Dexamethasone, Ondansetron and Treatment may vary due to age or medical condition  Airway Management Planned: Oral ETT  Additional Equipment:   Intra-op Plan:   Post-operative Plan: Extubation in OR  Informed Consent: I have reviewed the patients History and Physical, chart, labs and discussed the procedure including the risks, benefits and alternatives for the proposed anesthesia with the patient or authorized representative who has indicated his/her understanding and acceptance.     Dental advisory given  Plan Discussed with: CRNA and Surgeon  Anesthesia Plan Comments:          Anesthesia Quick Evaluation

## 2019-05-31 LAB — CBC WITH DIFFERENTIAL/PLATELET
Abs Immature Granulocytes: 0.04 10*3/uL (ref 0.00–0.07)
Basophils Absolute: 0 10*3/uL (ref 0.0–0.1)
Basophils Relative: 0 %
Eosinophils Absolute: 0 10*3/uL (ref 0.0–0.5)
Eosinophils Relative: 0 %
HCT: 35.8 % — ABNORMAL LOW (ref 36.0–46.0)
Hemoglobin: 11.3 g/dL — ABNORMAL LOW (ref 12.0–15.0)
Immature Granulocytes: 0 %
Lymphocytes Relative: 8 %
Lymphs Abs: 0.8 10*3/uL (ref 0.7–4.0)
MCH: 29.1 pg (ref 26.0–34.0)
MCHC: 31.6 g/dL (ref 30.0–36.0)
MCV: 92.3 fL (ref 80.0–100.0)
Monocytes Absolute: 0.8 10*3/uL (ref 0.1–1.0)
Monocytes Relative: 8 %
Neutro Abs: 8.1 10*3/uL — ABNORMAL HIGH (ref 1.7–7.7)
Neutrophils Relative %: 84 %
Platelets: 189 10*3/uL (ref 150–400)
RBC: 3.88 MIL/uL (ref 3.87–5.11)
RDW: 13.7 % (ref 11.5–15.5)
WBC: 9.7 10*3/uL (ref 4.0–10.5)
nRBC: 0 % (ref 0.0–0.2)

## 2019-05-31 LAB — COMPREHENSIVE METABOLIC PANEL
ALT: 291 U/L — ABNORMAL HIGH (ref 0–44)
AST: 103 U/L — ABNORMAL HIGH (ref 15–41)
Albumin: 3.1 g/dL — ABNORMAL LOW (ref 3.5–5.0)
Alkaline Phosphatase: 125 U/L (ref 38–126)
Anion gap: 7 (ref 5–15)
BUN: 8 mg/dL (ref 8–23)
CO2: 25 mmol/L (ref 22–32)
Calcium: 8 mg/dL — ABNORMAL LOW (ref 8.9–10.3)
Chloride: 108 mmol/L (ref 98–111)
Creatinine, Ser: 0.64 mg/dL (ref 0.44–1.00)
GFR calc Af Amer: >60 mL/min (ref >60)
GFR calc non Af Amer: >60 mL/min (ref >60)
Glucose, Bld: 111 mg/dL — ABNORMAL HIGH (ref 70–99)
Potassium: 3.6 mmol/L (ref 3.5–5.1)
Sodium: 140 mmol/L (ref 135–145)
Total Bilirubin: 1.2 mg/dL (ref 0.3–1.2)
Total Protein: 5.5 g/dL — ABNORMAL LOW (ref 6.5–8.1)

## 2019-05-31 MED ORDER — ACETAMINOPHEN 500 MG PO TABS: 500.0000 mg | ORAL_TABLET | Freq: Four times a day (QID) | ORAL | 0 refills | Status: DC | PRN

## 2019-05-31 NOTE — Progress Notes (Signed)
1 Day Post-Op   Subjective/Chief Complaint: Pt doing well with no pain post op   Objective: Vital signs in last 24 hours: Temp:  [97.3 F (36.3 C)-98.1 F (36.7 C)] 97.7 F (36.5 C) (04/04 0537) Pulse Rate:  [84-107] 102 (04/04 0537) Resp:  [15-21] 16 (04/04 0537) BP: (130-169)/(71-90) 165/76 (04/04 0537) SpO2:  [95 %-100 %] 97 % (04/04 0537) Last BM Date: 05/27/19  Intake/Output from previous day: 04/03 0701 - 04/04 0700 In: 3306.1 [P.O.:960; I.V.:1588.8; IV Piggyback:757.3] Out: 450 [Urine:400; Blood:50] Intake/Output this shift: No intake/output data recorded.  Constitutional: No acute distress, conversant, appears states age. Eyes: Anicteric sclerae, moist conjunctiva, no lid lag Lungs: Clear to auscultation bilaterally, normal respiratory effort CV: regular rate and rhythm, no murmurs, no peripheral edema, pedal pulses 2+ GI: Soft, no masses or hepatosplenomegaly, non-tender to palpation, inc c/d/i Skin: No rashes, palpation reveals normal turgor Psychiatric: appropriate judgment and insight, oriented to person, place, and time    Lab Results:  Recent Labs    05/30/19 0441 05/31/19 0455  WBC 5.7 9.7  HGB 10.7* 11.3*  HCT 34.1* 35.8*  PLT 169 189   BMET Recent Labs    05/30/19 0441 05/31/19 0455  NA 142 140  K 3.4* 3.6  CL 110 108  CO2 26 25  GLUCOSE 105* 111*  BUN 11 8  CREATININE 0.73 0.64  CALCIUM 7.9* 8.0*   PT/INR No results for input(s): LABPROT, INR in the last 72 hours. ABG No results for input(s): PHART, HCO3 in the last 72 hours.  Invalid input(s): PCO2, PO2  Studies/Results: MR 3D Recon At Scanner  Result Date: 05/29/2019 CLINICAL DATA:  79 year old female with history of cholelithiasis. Evaluate for potential choledocholithiasis. EXAM: MRI ABDOMEN WITHOUT AND WITH CONTRAST (INCLUDING MRCP) TECHNIQUE: Multiplanar multisequence MR imaging of the abdomen was performed both before and after the administration of intravenous contrast.  Heavily T2-weighted images of the biliary and pancreatic ducts were obtained, and three-dimensional MRCP images were rendered by post processing. CONTRAST:  57mL GADAVIST GADOBUTROL 1 MMOL/ML IV SOLN COMPARISON:  No prior abdominal MRI. Abdominal ultrasound 05/27/2019. 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 Hepatobiliary: Mild diffuse loss of signal intensity throughout the hepatic parenchyma on out of phase dual echo images, indicative of mild hepatic steatosis. 6 mm T1 hypointense, T2 hyperintense, nonenhancing lesion in segment 5 of the liver, compatible with a tiny simple cyst. No other suspicious cystic or solid hepatic lesions. Several tiny filling defects are noted lying dependently in the gallbladder, compatible with tiny gallstones. Gallbladder is only moderately distended. Gallbladder wall appears thickened and edematous, but there is no frank pericholecystic fluid. Multiple tiny filling defects are noted within the common bile duct, compatible with choledocholithiasis. Common bile duct is minimally dilated measuring 8 mm in the porta hepatis. Pancreas: No pancreatic mass. No pancreatic ductal dilatation. No pancreatic or peripancreatic fluid collections or inflammatory changes. Spleen:  Unremarkable. Adrenals/Urinary Tract: No suspicious renal lesions. No hydroureteronephrosis in the visualized portions of the abdomen. Bilateral adrenal glands are normal in appearance. Stomach/Bowel: Large periampullary duodenal diverticulum again noted. Otherwise, unremarkable. Vascular/Lymphatic: Aortic atherosclerosis, without evidence of aneurysm in the abdominal vasculature. No lymphadenopathy noted in the abdomen. Other: No significant volume of ascites noted in the visualized portions of the  peritoneal cavity. Musculoskeletal: Orthopedic fixation hardware  creating susceptibility artifact in the lumbar spine. Otherwise, unremarkable. IMPRESSION: 1. Cholelithiasis and choledocholithiasis. Mild common bile duct dilatation (7 mm). No intrahepatic biliary ductal dilatation. Gallbladder is only moderately distended, however, there is thickening and edema in the gallbladder wall, without frank pericholecystic fluid. Clinical correlation for signs and symptoms of acute cholecystitis is recommended. 2. Mild hepatic steatosis. 3. Additional incidental findings, as above. Electronically Signed   By: Vinnie Langton M.D.   On: 05/29/2019 12:11   DG ERCP BILIARY & PANCREATIC DUCTS  Result Date: 05/29/2019 CLINICAL DATA:  Choledocholithiasis. EXAM: ERCP TECHNIQUE: Multiple spot images obtained with the fluoroscopic device and submitted for interpretation post-procedure. FLUOROSCOPY TIME:  Fluoroscopy Time:  4 minutes and 48 seconds Number of Acquired Spot Images: 4 COMPARISON:  MRCP 05/29/2019 FINDINGS: Retrograde cholangiogram demonstrates filling defects in the common bile duct compatible with stones and/or sludge. Wire was advanced into the intrahepatic biliary system. Evidence for a balloon sweep. IMPRESSION: Choledocholithiasis and stone removal. These images were submitted for radiologic interpretation only. Please see the procedural report for the amount of contrast and the fluoroscopy time utilized. Electronically Signed   By: Markus Daft M.D.   On: 05/29/2019 16:42   MR ABDOMEN MRCP W WO CONTAST  Result Date: 05/29/2019 CLINICAL DATA:  79 year old female with history of cholelithiasis. Evaluate for potential choledocholithiasis. EXAM: MRI ABDOMEN WITHOUT AND WITH CONTRAST (INCLUDING MRCP) TECHNIQUE: Multiplanar multisequence MR imaging of the abdomen was performed both before and after the administration of intravenous contrast. Heavily T2-weighted images of the biliary and pancreatic ducts were  obtained, and three-dimensional MRCP images were rendered by post processing. CONTRAST:  38mL GADAVIST GADOBUTROL 1 MMOL/ML IV SOLN COMPARISON:  No prior abdominal MRI. Abdominal ultrasound 05/27/2019. 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 Hepatobiliary: Mild diffuse loss of signal intensity throughout the hepatic parenchyma on out of phase dual echo images, indicative of mild hepatic steatosis. 6 mm T1 hypointense, T2 hyperintense, nonenhancing lesion in segment 5 of the liver, compatible with a tiny simple cyst. No other suspicious cystic or solid hepatic lesions. Several tiny filling defects are noted lying dependently in the gallbladder, compatible with tiny gallstones. Gallbladder is only moderately distended. Gallbladder wall appears thickened and edematous, but there is no frank pericholecystic fluid. Multiple tiny filling defects are noted within the common bile duct, compatible with choledocholithiasis. Common bile duct is minimally dilated measuring 8 mm in the porta hepatis. Pancreas: No pancreatic mass. No pancreatic ductal dilatation. No pancreatic or peripancreatic fluid collections or inflammatory changes. Spleen:  Unremarkable. Adrenals/Urinary Tract: No suspicious renal lesions. No hydroureteronephrosis in the visualized portions of the abdomen. Bilateral adrenal glands are normal in appearance. Stomach/Bowel: Large periampullary duodenal diverticulum again noted. Otherwise, unremarkable. Vascular/Lymphatic: Aortic atherosclerosis, without evidence of aneurysm in the abdominal vasculature. No lymphadenopathy noted in the abdomen. Other: No significant volume of ascites noted in the visualized portions of the peritoneal cavity. Musculoskeletal: Orthopedic fixation hardware  creating susceptibility artifact in the lumbar spine. Otherwise, unremarkable. IMPRESSION: 1. Cholelithiasis and choledocholithiasis. Mild common bile duct dilatation (7 mm). No intrahepatic biliary ductal dilatation. Gallbladder is only moderately distended, however, there is thickening and edema in the gallbladder wall, without frank pericholecystic fluid. Clinical correlation for signs and symptoms of acute cholecystitis is recommended. 2. Mild hepatic steatosis. 3. Additional incidental findings, as  above. Electronically Signed   By: Vinnie Langton M.D.   On: 05/29/2019 12:11    Anti-infectives: Anti-infectives (From admission, onward)   Start     Dose/Rate Route Frequency Ordered Stop   05/30/19 0815  clindamycin (CLEOCIN) IVPB 900 mg     900 mg 100 mL/hr over 30 Minutes Intravenous On call to O.R. 05/30/19 [id number] 05/30/19 0838   05/30/19 0729  clindamycin (CLEOCIN) 900 MG/50ML IVPB    Note to Pharmacy: Marquis Buggy   : cabinet override      05/30/19 0729 05/30/19 0851   05/28/19 0600  ciprofloxacin (CIPRO) IVPB 400 mg     400 mg 200 mL/hr over 60 Minutes Intravenous Every 12 hours 05/28/19 0420     05/28/19 0430  metroNIDAZOLE (FLAGYL) IVPB 500 mg     500 mg 100 mL/hr over 60 Minutes Intravenous Every 8 hours 05/28/19 0420     05/27/19 1730  metroNIDAZOLE (FLAGYL) IVPB 500 mg     500 mg 100 mL/hr over 60 Minutes Intravenous  Once 05/27/19 1719 05/27/19 1933   05/27/19 1700  ciprofloxacin (CIPRO) IVPB 400 mg     400 mg 200 mL/hr over 60 Minutes Intravenous  Once 05/27/19 1659 05/27/19 1829      Assessment/Plan: s/p Procedure(s): LAPAROSCOPIC CHOLECYSTECTOMY (N/A) -OK for soft diet, if tolerated ok for DC from Surgery standpoint -f/u in chart  LOS: 4 days    Ralene Ok 05/31/2019

## 2019-05-31 NOTE — Discharge Summary (Signed)
Physician Discharge Summary  Rachel Chase [id number] DOB: 10-27-1940 DOA: 05/27/2019  PCP: Burnard Bunting, MD  Admit date: 05/27/2019 Discharge date: 05/31/2019  Admitted From: Home  Disposition:  Home   Recommendations for Outpatient Follow-up and new medication changes:  1. Follow up with Dr. Reynaldo Minium in 7 days.  2. Advance diet as tolerated.  3. Follow up with surgery in 2 weeks [facility])    Home Health: no   Equipment/Devices: no    Discharge Condition: stable  CODE STATUS: full  Diet recommendation: heart healthy   Brief/Interim Summary: Patient was admitted to the hospital with working diagnosis of acute cholecystitis complicated with choledocholithiasis, now sp ERCP and laparoscopic cholecystectomy.   79 year old female who presented with nausea and vomiting. She does have significant past medical history for coronary artery disease status post PCI, in 2012, hypertension, dyslipidemia and anxiety. She was transferred from [facility] ED for further management of acute cholecystitis.  Patient reported postpandrialright upper quadrant abdominal discomfort for about 6 months, worsening over the last 3 to 4 days, associated with nausea and vomiting. On her initial physical examination blood pressure 127/58, heart rate 102, respiratory rate 24, temperature 98.3, oxygen saturation 97%, her lungs were clear to auscultation bilaterally, heart S1-S2 present rhythmic, her abdomen was soft, positive tenderness to palpation right upper quadrant, negative Murphy sign, no guarding or rigidity, no lower extremity edema. Sodium 137, potassium 3.5, chloride 98, bicarb 28, glucose 155, BUN 33, creatinine 0.78, alkaline phosphatase 190, lipase 69, AST 866, ALT 540, total bilirubins 3.0, white count 13.7, hemoglobin 14.2, hematocrit 42.9, platelets 277. SARS COVID-19 negative, urinalysis 0-5 white cells. Abdominal ultrasound with multiple small gallstones with  gallbladder wall thickening, dilated common bile duct, no evidence of choledocholithiasis. EKG 80 bpm, normal axis, normal intervals, sinus rhythm, no ST segment T wave changes.  Further work up with MRCP, positive for cholelithiasis with choledocholithiasis, common bile duct dilatation at 7 mm. Patient underwenr ERCP and stone was removed, with good toleration.   The following day patient underwent laparoscopic cholecystectomy with no major complications.   1.  Acute cholecystitis, complicated by choledocholithiasis, status post ERCP and laparoscopic cholecystectomy.  Patient was admitted to the medical ward, she received supportive medical therapy with intravenous fluids, broad-spectrum antibiotic therapy, as needed analgesics and antiemetics. Follow-up liver panel showed worsening bilirubins and elevation of AST, ALT and alkaline phosphatase.  Further work-up with MRCP showed choledocholithiasis with common bile duct dilatation 7 mm.  She underwent ERCP, choledocholithiasis was found, complete removal was accomplished with biliary sphincterotomy and balloon extraction.  The biliary tree was swept.  Common bile duct was successfully dilated.  The following day patient underwent laparoscopic cholecystectomy with no major complications. Symptoms remarkably improved, her diet has been advanced, she will be discharged, follow-up as an outpatient.  2.  Hypertension.  Her blood pressure remained well controlled, she will resume antihypertensive at discharge, hctz, lisinopril, and metoprolol.   3.  Dyslipidemia.  Patient will resume statin therapy.  4.  COPD.  No signs of acute exacerbation, continue bronchodilators and inhaled corticosteroids.  Discharge Diagnoses:  Principal Problem:   Acute cholecystitis Active Problems:   Hyperlipemia   Abdominal pain   Nausea & vomiting   Hypertension    Discharge Instructions   Allergies as of 05/31/2019      Reactions   Dilaudid [hydromorphone Hcl]  Nausea And Vomiting   Penicillins Rash      Medication List    STOP taking  these medications   NONFORMULARY OR COMPOUNDED ITEM     TAKE these medications   acetaminophen 500 MG tablet Commonly known as: TYLENOL Take 1 tablet (500 mg total) by mouth every 6 (six) hours as needed for moderate pain.   albuterol 108 (90 Base) MCG/ACT inhaler Commonly known as: VENTOLIN HFA Inhale 2 puffs into the lungs daily as needed for wheezing or shortness of breath.   aspirin EC 81 MG tablet Take 81 mg by mouth daily with supper.   atorvastatin 40 MG tablet Commonly known as: LIPITOR Take 40 mg by mouth at bedtime.   hydrochlorothiazide 25 MG tablet Commonly known as: HYDRODIURIL Take 25 mg by mouth daily.   lisinopril 40 MG tablet Commonly known as: ZESTRIL Take 40 mg by mouth daily.   LORazepam 1 MG tablet Commonly known as: ATIVAN Take 1 mg by mouth at bedtime.   metoprolol tartrate 50 MG tablet Commonly known as: LOPRESSOR Take 50 mg by mouth 2 (two) times daily.   Nitrostat 0.4 MG SL tablet Generic drug: nitroGLYCERIN DISSOLVE ONE TABLET UNDER THE TONGUE AS NEEDED FOR CHEST PAIN AS DIRECTED What changed: See the new instructions.   SOOTHE HYDRATION OP Place 1 drop into both eyes daily as needed (dry eyes).   SYMBICORT IN Inhale 2 puffs into the lungs 2 (two) times daily.   Vitamin D (Ergocalciferol) 1.25 MG (50000 UNIT) Caps capsule Commonly known as: DRISDOL TAKE ONE CAPSULE (50 000 UNITS) BY MOUTHEVERY 14 DAYS What changed: when to take this      [facility] Schedule an appointment as soon as possible for a visit in 2 week(s).   Specialty: General Surgery Contact information: [place] [phone] [phone]         Allergies  Allergen Reactions  . Dilaudid [Hydromorphone Hcl] Nausea And Vomiting  . Penicillins Rash    Consultations:  Surgery   GI     Procedures/Studies: MR 3D Recon At Scanner  Result Date: 05/29/2019 CLINICAL DATA:  79 year old female with history of cholelithiasis. Evaluate for potential choledocholithiasis. EXAM: MRI ABDOMEN WITHOUT AND WITH CONTRAST (INCLUDING MRCP) TECHNIQUE: Multiplanar multisequence MR imaging of the abdomen was performed both before and after the administration of intravenous contrast. Heavily T2-weighted images of the biliary and pancreatic ducts were obtained, and three-dimensional MRCP images were rendered by post processing. CONTRAST:  57mL GADAVIST GADOBUTROL 1 MMOL/ML IV SOLN COMPARISON:  No prior abdominal MRI. Abdominal ultrasound 05/27/2019. 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 Hepatobiliary: Mild diffuse loss of signal intensity throughout the hepatic parenchyma on out of phase dual echo images, indicative of mild hepatic steatosis. 6 mm T1 hypointense, T2 hyperintense, nonenhancing lesion in segment 5 of the liver, compatible with a tiny simple cyst. No other suspicious cystic or solid hepatic lesions. Several tiny filling defects are noted lying dependently in the gallbladder, compatible with tiny gallstones. Gallbladder is only moderately distended. Gallbladder wall appears thickened and edematous, but there is no frank pericholecystic fluid. Multiple tiny filling defects are noted within the common bile duct, compatible with choledocholithiasis. Common bile duct is minimally dilated measuring 8 mm in the porta hepatis. Pancreas: No pancreatic mass. No pancreatic ductal dilatation. No pancreatic or peripancreatic fluid collections or inflammatory changes.  Spleen:  Unremarkable. Adrenals/Urinary Tract: No suspicious renal lesions. No hydroureteronephrosis in the visualized  portions of the abdomen. Bilateral adrenal glands are normal in appearance. Stomach/Bowel: Large periampullary duodenal diverticulum again noted. Otherwise, unremarkable. Vascular/Lymphatic: Aortic atherosclerosis, without evidence of aneurysm in the abdominal vasculature. No lymphadenopathy noted in the abdomen. Other: No significant volume of ascites noted in the visualized portions of the peritoneal cavity. Musculoskeletal: Orthopedic fixation hardware creating susceptibility artifact in the lumbar spine. Otherwise, unremarkable. IMPRESSION: 1. Cholelithiasis and choledocholithiasis. Mild common bile duct dilatation (7 mm). No intrahepatic biliary ductal dilatation. Gallbladder is only moderately distended, however, there is thickening and edema in the gallbladder wall, without frank pericholecystic fluid. Clinical correlation for signs and symptoms of acute cholecystitis is recommended. 2. Mild hepatic steatosis. 3. Additional incidental findings, as above. Electronically Signed   By: Vinnie Langton M.D.   On: 05/29/2019 12:11   DG ERCP BILIARY & PANCREATIC DUCTS  Result Date: 05/29/2019 CLINICAL DATA:  Choledocholithiasis. EXAM: ERCP TECHNIQUE: Multiple spot images obtained with the fluoroscopic device and submitted for interpretation post-procedure. FLUOROSCOPY TIME:  Fluoroscopy Time:  4 minutes and 48 seconds Number of Acquired Spot Images: 4 COMPARISON:  MRCP 05/29/2019 FINDINGS: Retrograde cholangiogram demonstrates filling defects in the common bile duct compatible with stones and/or sludge. Wire was advanced into the intrahepatic biliary system. Evidence for a balloon sweep. IMPRESSION: Choledocholithiasis and stone removal. These images were submitted for radiologic interpretation only. Please see the procedural report for the amount of contrast and the fluoroscopy time utilized. Electronically Signed   By: Markus Daft M.D.   On: 05/29/2019 16:42   MR ABDOMEN MRCP W WO CONTAST  Result Date:  05/29/2019 CLINICAL DATA:  79 year old female with history of cholelithiasis. Evaluate for potential choledocholithiasis. EXAM: MRI ABDOMEN WITHOUT AND WITH CONTRAST (INCLUDING MRCP) TECHNIQUE: Multiplanar multisequence MR imaging of the abdomen was performed both before and after the administration of intravenous contrast. Heavily T2-weighted images of the biliary and pancreatic ducts were obtained, and three-dimensional MRCP images were rendered by post processing. CONTRAST:  9mL GADAVIST GADOBUTROL 1 MMOL/ML IV SOLN COMPARISON:  No prior abdominal MRI. Abdominal ultrasound 05/27/2019. 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 Hepatobiliary: Mild diffuse loss of signal intensity throughout the hepatic parenchyma on out of phase dual echo images, indicative of mild hepatic steatosis. 6 mm T1 hypointense, T2 hyperintense, nonenhancing lesion in segment 5 of the liver, compatible with a tiny simple cyst. No other suspicious cystic or solid hepatic lesions. Several tiny filling defects are noted lying dependently in the gallbladder, compatible with tiny gallstones. Gallbladder is only moderately distended. Gallbladder wall appears thickened and edematous, but there is no frank pericholecystic fluid. Multiple tiny filling defects are noted within the common bile duct, compatible with choledocholithiasis. Common bile duct is minimally dilated measuring 8 mm in the porta hepatis. Pancreas: No pancreatic mass. No pancreatic ductal dilatation. No pancreatic or peripancreatic fluid collections or inflammatory changes. Spleen:  Unremarkable. Adrenals/Urinary Tract: No suspicious renal lesions. No hydroureteronephrosis in the visualized portions of the abdomen. Bilateral adrenal glands are normal in  appearance. Stomach/Bowel: Large periampullary duodenal diverticulum again noted. Otherwise, unremarkable. Vascular/Lymphatic: Aortic atherosclerosis, without evidence of aneurysm in the abdominal vasculature. No lymphadenopathy noted in the abdomen. Other: No significant volume of ascites noted in the visualized portions of the  peritoneal cavity. Musculoskeletal: Orthopedic fixation hardware creating susceptibility artifact in the lumbar spine. Otherwise, unremarkable. IMPRESSION: 1. Cholelithiasis and choledocholithiasis. Mild common bile duct dilatation (7 mm). No intrahepatic biliary ductal dilatation. Gallbladder is only moderately distended, however, there is thickening and edema in the gallbladder wall, without frank pericholecystic fluid. Clinical correlation for signs and symptoms of acute cholecystitis is recommended. 2. Mild hepatic steatosis. 3. Additional incidental findings, as above. Electronically Signed   By: Vinnie Langton M.D.   On: 05/29/2019 12:11   US BREAST LTD UNI RIGHT INC AXILLA  Result Date: 05/05/2019 CLINICAL DATA:  79 year old female for six-month follow-up of benign RIGHT breast biopsy. EXAM: DIGITAL DIAGNOSTIC RIGHT MAMMOGRAM WITH CAD AND TOMO ULTRASOUND RIGHT BREAST COMPARISON:  Previous exam(s). ACR Breast Density Category b: There are scattered areas of fibroglandular density. FINDINGS: 2D/3D full field views of the RIGHT breast demonstrate a RIBBON clip within the [facility] RIGHT breast with minimal adjacent post biopsy changes. The biopsied mass is now difficult to visualize. No other new or suspicious abnormalities noted Mammographic images were processed with CAD. Targeted ultrasound is performed, showing the biopsied mass at the 2 o'clock position of the RIGHT breast 5 cm from the nipple now measuring 1 x 2 mm, previously 3 x 3 mm. IMPRESSION: 1. Decreased size of biopsied benign mass within the UPPER INNER RIGHT breast. No further imaging follow-up recommended.  RECOMMENDATION: Bilateral screening mammogram in 5 months to resume annual mammogram schedule. I have discussed the findings and recommendations with the patient. If applicable, a reminder letter will be sent to the patient regarding the next appointment. BI-RADS CATEGORY  2: Benign. Electronically Signed   By: Margarette Canada M.D.   On: 05/05/2019 11:31   MM DIAG BREAST TOMO UNI RIGHT  Result Date: 05/05/2019 CLINICAL DATA:  79 year old female for six-month follow-up of benign RIGHT breast biopsy. EXAM: DIGITAL DIAGNOSTIC RIGHT MAMMOGRAM WITH CAD AND TOMO ULTRASOUND RIGHT BREAST COMPARISON:  Previous exam(s). ACR Breast Density Category b: There are scattered areas of fibroglandular density. FINDINGS: 2D/3D full field views of the RIGHT breast demonstrate a RIBBON clip within the [facility] RIGHT breast with minimal adjacent post biopsy changes. The biopsied mass is now difficult to visualize. No other new or suspicious abnormalities noted Mammographic images were processed with CAD. Targeted ultrasound is performed, showing the biopsied mass at the 2 o'clock position of the RIGHT breast 5 cm from the nipple now measuring 1 x 2 mm, previously 3 x 3 mm. IMPRESSION: 1. Decreased size of biopsied benign mass within the UPPER INNER RIGHT breast. No further imaging follow-up recommended. RECOMMENDATION: Bilateral screening mammogram in 5 months to resume annual mammogram schedule. I have discussed the findings and recommendations with the patient. If applicable, a reminder letter will be sent to the patient regarding the next appointment. BI-RADS CATEGORY  2: Benign. Electronically Signed   By: Margarette Canada M.D.   On: 05/05/2019 11:31   US Abdomen Limited RUQ  Result Date: 05/27/2019 CLINICAL DATA:  Elevated LFTs, total bilirubin, and lipase. Elevated white blood cell count EXAM: ULTRASOUND ABDOMEN LIMITED RIGHT UPPER QUADRANT COMPARISON:  None. FINDINGS: Gallbladder: Multiple shadowing gallstones are identified measuring  up to 5 mm in size. Gallbladder wall thickening measuring up to 7 mm. There is a negative sonographic Murphy sign. Common bile duct: Diameter: 12 mm Liver: No focal lesion identified. Within normal limits in parenchymal echogenicity. Portal vein is patent on color Doppler imaging with normal direction of blood flow towards the liver. Other: None.  IMPRESSION: 1. Multiple small gallstones, with gallbladder wall thickening compatible with acute cholecystitis. 2. Dilated common bile duct.  No evidence of choledocholithiasis. Electronically Signed   By: Randa Ngo M.D.   On: 05/27/2019 18:30      Procedures: ERCP, laparoscopic cholecystectomy.  Subjective: Patient is feeling better, no nausea or vomiting, no abdominal pain.   Discharge Exam: Vitals:   05/31/19 0537 05/31/19 0924  BP: (!) 165/76 (!) 158/79  Pulse: (!) 102 93  Resp: 16   Temp: 97.7 F (36.5 C) 97.6 F (36.4 C)  SpO2: 97% 97%   Vitals:   05/30/19 2227 05/31/19 0216 05/31/19 0537 05/31/19 0924  BP: (!) 157/79 (!) 156/79 (!) 165/76 (!) 158/79  Pulse: 88 94 (!) 102 93  Resp: 16 16 16    Temp: 98 F (36.7 C) 98 F (36.7 C) 97.7 F (36.5 C) 97.6 F (36.4 C)  TempSrc: Oral Oral Oral   SpO2: 98% 98% 97% 97%  Weight:      Height:        General: Not in pain or dyspnea.  Neurology: Awake and alert, non focal  E ENT: no pallor, no icterus, oral mucosa moist Cardiovascular: No JVD. S1-S2 present, rhythmic, no gallops, rubs, or murmurs. No lower extremity edema. Pulmonary: positive breath sounds bilaterally, adequate air movement, no wheezing, rhonchi or rales. Gastrointestinal. Abdomen with no organomegaly, non tender, no rebound or guarding Skin. No rashes Musculoskeletal: no joint deformities   The results of significant diagnostics from this hospitalization (including imaging, microbiology, ancillary and laboratory) are listed below for reference.     Microbiology: Recent Results (from the past 240 hour(s))   Urine culture     Status: Abnormal   Collection Time: 05/27/19  4:00 PM   Specimen: Urine, Random  Result Value Ref Range Status   Specimen Description   Final    URINE, RANDOM Performed at [facility], [address] [place], [place] [address]    Special Requests   Final    NONE Performed at [facility], [address] [place], [place] [address]    Culture >=100,000 COLONIES/mL ESCHERICHIA COLI (A)  Final   Report Status 05/29/2019 FINAL  Final   Organism ID, Bacteria ESCHERICHIA COLI (A)  Final      Susceptibility   Escherichia coli - MIC*    AMPICILLIN <=2 SENSITIVE Sensitive     CEFAZOLIN <=4 SENSITIVE Sensitive     CEFTRIAXONE <=0.25 SENSITIVE Sensitive     CIPROFLOXACIN <=0.25 SENSITIVE Sensitive     GENTAMICIN <=1 SENSITIVE Sensitive     IMIPENEM <=0.25 SENSITIVE Sensitive     NITROFURANTOIN <=16 SENSITIVE Sensitive     TRIMETH/SULFA <=20 SENSITIVE Sensitive     AMPICILLIN/SULBACTAM <=2 SENSITIVE Sensitive     PIP/TAZO <=4 SENSITIVE Sensitive     * >=100,000 COLONIES/mL ESCHERICHIA COLI  Respiratory Panel by RT PCR (Flu A&B, Covid) - Nasopharyngeal Swab     Status: None   Collection Time: 05/27/19  6:52 PM   Specimen: Nasopharyngeal Swab  Result Value Ref Range Status   SARS Coronavirus 2 by RT PCR NEGATIVE NEGATIVE Final    Comment: (NOTE) SARS-CoV-2 target nucleic acids are NOT DETECTED. The SARS-CoV-2 RNA is generally detectable in upper respiratoy specimens during the acute phase of infection. The lowest concentration of SARS-CoV-2 viral copies this assay can detect is 131 copies/mL. A negative result does not preclude SARS-Cov-2 infection and should not be used as the sole basis for  treatment or other patient management decisions. A negative result may occur with  improper specimen collection/handling, submission of specimen other than nasopharyngeal swab, presence of viral mutation(s) within the areas targeted by this assay, and  inadequate number of viral copies (<131 copies/mL). A negative result must be combined with clinical observations, patient history, and epidemiological information. The expected result is Negative. Fact Sheet for Patients:  PinkCheek.be Fact Sheet for Healthcare Providers:  GravelBags.it This test is not yet ap proved or cleared by the [place] FDA and  has been authorized for detection and/or diagnosis of SARS-CoV-2 by FDA under an Emergency Use Authorization (EUA). This EUA will remain  in effect (meaning this test can be used) for the duration of the COVID-19 declaration under Section 564(b)(1) of the Act, 21 U.S.C. section 360bbb-3(b)(1), unless the authorization is terminated or revoked sooner.    Influenza A by PCR NEGATIVE NEGATIVE Final   Influenza B by PCR NEGATIVE NEGATIVE Final    Comment: (NOTE) The Xpert Xpress SARS-CoV-2/FLU/RSV assay is intended as an aid in  the diagnosis of influenza from Nasopharyngeal swab specimens and  should not be used as a sole basis for treatment. Nasal washings and  aspirates are unacceptable for Xpert Xpress SARS-CoV-2/FLU/RSV  testing. Fact Sheet for Patients: PinkCheek.be Fact Sheet for Healthcare Providers: GravelBags.it This test is not yet approved or cleared by the [place] FDA and  has been authorized for detection and/or diagnosis of SARS-CoV-2 by  FDA under an Emergency Use Authorization (EUA). This EUA will remain  in effect (meaning this test can be used) for the duration of the  Covid-19 declaration under Section 564(b)(1) of the Act, 21  U.S.C. section 360bbb-3(b)(1), unless the authorization is  terminated or revoked. Performed at [facility], [address] [place], [place] [address]   Culture, blood (Routine X 2) w Reflex to ID Panel     Status: None (Preliminary result)   Collection  Time: 05/28/19  4:32 AM   Specimen: BLOOD  Result Value Ref Range Status   Specimen Description   Final    BLOOD LEFT ANTECUBITAL Performed at [facility] [address] [place], [place] [address]    Special Requests   Final    BOTTLES DRAWN AEROBIC ONLY Blood Culture results may not be optimal due to an inadequate volume of blood received in culture bottles Performed at [facility] [address], [place], [place] [address]    Culture   Final    NO GROWTH 3 DAYS Performed at [facility], [place] [address] [place], [place] [address]    Report Status PENDING  Incomplete  Culture, blood (Routine X 2) w Reflex to ID Panel     Status: None (Preliminary result)   Collection Time: 05/28/19  4:39 AM   Specimen: BLOOD RIGHT HAND  Result Value Ref Range Status   Specimen Description   Final    BLOOD RIGHT HAND Performed at [facility] [address], [place], [place] [address]    Special Requests   Final    BOTTLES DRAWN AEROBIC ONLY Blood Culture results may not be optimal due to an inadequate volume of blood received in culture bottles Performed at [facility] [address], [place], [place] [address]    Culture   Final    NO GROWTH 3 DAYS Performed at [facility], [place] [address] [place], [place] [address]  Report Status PENDING  Incomplete  MRSA PCR Screening     Status: None   Collection Time: 05/30/19  7:26 AM   Specimen: Nasopharyngeal  Result Value Ref Range Status   MRSA by PCR NEGATIVE NEGATIVE Final    Comment:        The GeneXpert MRSA Assay (FDA approved for NASAL specimens only), is one component of a comprehensive MRSA colonization surveillance program. It is not intended to diagnose MRSA infection nor to guide or monitor treatment for MRSA infections. Performed at [facility], [place] [address], [place], [place] [address]      Labs: BNP (last  3 results) No results for input(s): BNP in the last 8760 hours. Basic Metabolic Panel: Recent Labs  Lab 05/27/19 1622 05/28/19 0432 05/29/19 0436 05/30/19 0441 05/31/19 0455  NA 137 137 140 142 140  K 3.5 3.3* 3.6 3.4* 3.6  CL 98 101 105 110 108  CO2 28 26 25 26 25   GLUCOSE 155* 107* 119* 105* 111*  BUN 23 21 17 11 8   CREATININE 0.78 0.86 0.83 0.73 0.64  CALCIUM 9.2 8.5* 8.4* 7.9* 8.0*  MG  --  1.9  --   --   --    Liver Function Tests: Recent Labs  Lab 05/27/19 1622 05/28/19 0432 05/29/19 0436 05/30/19 0441 05/31/19 0455  AST 866* 1,223* 374* 131* 103*  ALT 540* 1,279* 675* 366* 291*  ALKPHOS 190* 199* 169* 133* 125  BILITOT 3.0* 4.7* 3.4* 1.6* 1.2  PROT 6.8 6.2* 6.0* 5.0* 5.5*  ALBUMIN 4.0 3.6 3.4* 2.8* 3.1*   Recent Labs  Lab 05/27/19 1622  LIPASE 69*   No results for input(s): AMMONIA in the last 168 hours. CBC: Recent Labs  Lab 05/27/19 1622 05/28/19 0432 05/29/19 0436 05/30/19 0441 05/31/19 0455  WBC 13.7* 10.2 7.8 5.7 9.7  NEUTROABS  --  8.6* 5.8 4.0 8.1*  HGB 14.2 13.1 12.6 10.7* 11.3*  HCT 42.9 40.1 38.5 34.1* 35.8*  MCV 88.5 89.1 90.0 91.7 92.3  PLT 277 227 185 169 189   Cardiac Enzymes: No results for input(s): CKTOTAL, CKMB, CKMBINDEX, TROPONINI in the last 168 hours. BNP: Invalid input(s): POCBNP CBG: No results for input(s): GLUCAP in the last 168 hours. D-Dimer No results for input(s): DDIMER in the last 72 hours. Hgb A1c No results for input(s): HGBA1C in the last 72 hours. Lipid Profile No results for input(s): CHOL, HDL, LDLCALC, TRIG, CHOLHDL, LDLDIRECT in the last 72 hours. Thyroid function studies No results for input(s): TSH, T4TOTAL, T3FREE, THYROIDAB in the last 72 hours.  Invalid input(s): FREET3 Anemia work up No results for input(s): VITAMINB12, FOLATE, FERRITIN, TIBC, IRON, RETICCTPCT in the last 72 hours. Urinalysis    Component Value Date/Time   COLORURINE YELLOW 05/27/2019 1600   APPEARANCEUR CLEAR 05/27/2019  1600   LABSPEC 1.020 05/27/2019 1600   PHURINE 8.0 05/27/2019 1600   GLUCOSEU NEGATIVE 05/27/2019 1600   HGBUR NEGATIVE 05/27/2019 1600   BILIRUBINUR NEGATIVE 05/27/2019 1600   BILIRUBINUR negatvie 01/25/2014 1726   KETONESUR NEGATIVE 05/27/2019 1600   PROTEINUR NEGATIVE 05/27/2019 1600   UROBILINOGEN 1.0 01/25/2014 1726   UROBILINOGEN 0.2 01/18/2011 2219   NITRITE POSITIVE (A) 05/27/2019 1600   LEUKOCYTESUR TRACE (A) 05/27/2019 1600   Sepsis Labs Invalid input(s): PROCALCITONIN,  WBC,  LACTICIDVEN Microbiology Recent Results (from the past 240 hour(s))  Urine culture     Status: Abnormal   Collection Time: 05/27/19  4:00 PM   Specimen: Urine, Random  Result Value Ref Range Status   Specimen Description   Final    URINE, RANDOM Performed at [facility], [address] [place], [place] [address]    Special Requests   Final    NONE Performed at [facility], [address] [place], [place] [address]    Culture >=100,000 COLONIES/mL ESCHERICHIA COLI (A)  Final   Report Status 05/29/2019 FINAL  Final   Organism ID, Bacteria ESCHERICHIA COLI (A)  Final      Susceptibility   Escherichia coli - MIC*    AMPICILLIN <=2 SENSITIVE Sensitive     CEFAZOLIN <=4 SENSITIVE Sensitive     CEFTRIAXONE <=0.25 SENSITIVE Sensitive     CIPROFLOXACIN <=0.25 SENSITIVE Sensitive     GENTAMICIN <=1 SENSITIVE Sensitive     IMIPENEM <=0.25 SENSITIVE Sensitive     NITROFURANTOIN <=16 SENSITIVE Sensitive     TRIMETH/SULFA <=20 SENSITIVE Sensitive     AMPICILLIN/SULBACTAM <=2 SENSITIVE Sensitive     PIP/TAZO <=4 SENSITIVE Sensitive     * >=100,000 COLONIES/mL ESCHERICHIA COLI  Respiratory Panel by RT PCR (Flu A&B, Covid) - Nasopharyngeal Swab     Status: None   Collection Time: 05/27/19  6:52 PM   Specimen: Nasopharyngeal Swab  Result Value Ref Range Status   SARS Coronavirus 2 by RT PCR NEGATIVE NEGATIVE Final    Comment: (NOTE) SARS-CoV-2 target nucleic acids are NOT  DETECTED. The SARS-CoV-2 RNA is generally detectable in upper respiratoy specimens during the acute phase of infection. The lowest concentration of SARS-CoV-2 viral copies this assay can detect is 131 copies/mL. A negative result does not preclude SARS-Cov-2 infection and should not be used as the sole basis for treatment or other patient management decisions. A negative result may occur with  improper specimen collection/handling, submission of specimen other than nasopharyngeal swab, presence of viral mutation(s) within the areas targeted by this assay, and inadequate number of viral copies (<131 copies/mL). A negative result must be combined with clinical observations, patient history, and epidemiological information. The expected result is Negative. Fact Sheet for Patients:  PinkCheek.be Fact Sheet for Healthcare Providers:  GravelBags.it This test is not yet ap proved or cleared by the [place] FDA and  has been authorized for detection and/or diagnosis of SARS-CoV-2 by FDA under an Emergency Use Authorization (EUA). This EUA will remain  in effect (meaning this test can be used) for the duration of the COVID-19 declaration under Section 564(b)(1) of the Act, 21 U.S.C. section 360bbb-3(b)(1), unless the authorization is terminated or revoked sooner.    Influenza A by PCR NEGATIVE NEGATIVE Final   Influenza B by PCR NEGATIVE NEGATIVE Final    Comment: (NOTE) The Xpert Xpress SARS-CoV-2/FLU/RSV assay is intended as an aid in  the diagnosis of influenza from Nasopharyngeal swab specimens and  should not be used as a sole basis for treatment. Nasal washings and  aspirates are unacceptable for Xpert Xpress SARS-CoV-2/FLU/RSV  testing. Fact Sheet for Patients: PinkCheek.be Fact Sheet for Healthcare Providers: GravelBags.it This test is not yet approved or cleared  by the [place] FDA and  has been authorized for detection and/or diagnosis of SARS-CoV-2 by  FDA under an Emergency Use Authorization (EUA). This EUA will remain  in effect (meaning this test can be used) for the duration of the  Covid-19 declaration under Section 564(b)(1) of the Act, 21  U.S.C. section 360bbb-3(b)(1), unless the authorization is  terminated or revoked. Performed at [facility],  [place], [place] [address]   Culture, blood (Routine X 2) w Reflex to ID Panel     Status: None (Preliminary result)   Collection Time: 05/28/19  4:32 AM   Specimen: BLOOD  Result Value Ref Range Status   Specimen Description   Final    BLOOD LEFT ANTECUBITAL Performed at [facility] [address] [place], [place] [address]    Special Requests   Final    BOTTLES DRAWN AEROBIC ONLY Blood Culture results may not be optimal due to an inadequate volume of blood received in culture bottles Performed at [facility] [address], [place], [place] [address]    Culture   Final    NO GROWTH 3 DAYS Performed at [facility], [place] [address], [place], [place] [address]    Report Status PENDING  Incomplete  Culture, blood (Routine X 2) w Reflex to ID Panel     Status: None (Preliminary result)   Collection Time: 05/28/19  4:39 AM   Specimen: BLOOD RIGHT HAND  Result Value Ref Range Status   Specimen Description   Final    BLOOD RIGHT HAND Performed at [facility] [address], [place], [place] [address]    Special Requests   Final    BOTTLES DRAWN AEROBIC ONLY Blood Culture results may not be optimal due to an inadequate volume of blood received in culture bottles Performed at [facility] [address], [place], [place] [address]    Culture   Final    NO GROWTH 3 DAYS Performed at [facility], [place] [address] [place], [place] [address]    Report Status PENDING   Incomplete  MRSA PCR Screening     Status: None   Collection Time: 05/30/19  7:26 AM   Specimen: Nasopharyngeal  Result Value Ref Range Status   MRSA by PCR NEGATIVE NEGATIVE Final    Comment:        The GeneXpert MRSA Assay (FDA approved for NASAL specimens only), is one component of a comprehensive MRSA colonization surveillance program. It is not intended to diagnose MRSA infection nor to guide or monitor treatment for MRSA infections. Performed at [facility], [place] [address] [place],  [address]      Time coordinating discharge: 45 minutes  SIGNED:   Tawni Millers, MD  [facility] 05/31/2019, 9:52 AM

## 2019-05-31 NOTE — Discharge Instructions (Signed)
[facility] ______[facility] °LAPAROSCOPIC SURGERY: POST OP INSTRUCTIONS °Always review your discharge instruction sheet given to you by the facility where your surgery was performed. °IF YOU HAVE DISABILITY OR FAMILY LEAVE FORMS, YOU MUST BRING THEM TO THE OFFICE FOR PROCESSING.   °DO NOT GIVE THEM TO YOUR DOCTOR. ° °1. A prescription for pain medication may be given to you upon discharge.  Take your pain medication as prescribed, if needed.  If narcotic pain medicine is not needed, then you may take acetaminophen (Tylenol) or ibuprofen (Advil) as needed. °2. Take your usually prescribed medications unless otherwise directed. °3. If you need a refill on your pain medication, please contact your pharmacy.  They will contact our office to request authorization. Prescriptions will not be filled after 5pm or on week-ends. °4. You should follow a light diet the first few days after arrival home, such as soup and crackers, etc.  Be sure to include lots of fluids daily. °5. Most patients will experience some swelling and bruising in the area of the incisions.  Ice packs will help.  Swelling and bruising can take several days to resolve.  °6. It is common to experience some constipation if taking pain medication after surgery.  Increasing fluid intake and taking a stool softener (such as Colace) will usually help or prevent this problem from occurring.  A mild laxative (Milk of Magnesia or Miralax) should be taken according to package instructions if there are no bowel movements after 48 hours. °7. Unless discharge instructions indicate otherwise, you may remove your bandages 24-48 hours after surgery, and you may shower at that time.  You may have steri-strips (small skin tapes) in place directly over the incision.  These strips should be left on the skin for 7-10 days.  If your surgeon used skin glue on the incision, you may shower in 24 hours.  The glue will flake off over the next 2-3 weeks.  Any sutures or  staples will be removed at the office during your follow-up visit. °8. ACTIVITIES:  You may resume regular (light) daily activities beginning the next day--such as daily self-care, walking, climbing stairs--gradually increasing activities as tolerated.  You may have sexual intercourse when it is comfortable.  Refrain from any heavy lifting or straining until approved by your doctor. °a. You may drive when you are no longer taking prescription pain medication, you can comfortably wear a seatbelt, and you can safely maneuver your car and apply brakes. °b. RETURN TO WORK:  __________________________________________________________ °9. You should see your doctor in the office for a follow-up appointment approximately 2-3 weeks after your surgery.  Make sure that you call for this appointment within a day or two after you arrive home to insure a convenient appointment time. °10. OTHER INSTRUCTIONS: __________________________________________________________________________________________________________________________ __________________________________________________________________________________________________________________________ °WHEN TO CALL YOUR DOCTOR: °1. Fever over 101.0 °2. Inability to urinate °3. Continued bleeding from incision. °4. Increased pain, redness, or drainage from the incision. °5. Increasing abdominal pain ° °The clinic staff is available to answer your questions during regular business hours.  Please don’t hesitate to call and ask to speak to one of the nurses for clinical concerns.  If you have a medical emergency, go to the nearest emergency room or call 911.  A surgeon from [facility] is always on call at the hospital. °[address], [address], [place], [place]  [address] ? [address] [address], [place], [place]   [address] °[phone] ? [phone] ? FAX [phone] °Web site:   [email or web address] °

## 2019-05-31 NOTE — Plan of Care (Signed)
Patient discharged home in stable condition 

## 2019-06-02 LAB — CULTURE, BLOOD (ROUTINE X 2)
Culture: NO GROWTH
Culture: NO GROWTH

## 2019-06-02 LAB — SURGICAL PATHOLOGY

## 2019-08-19 ENCOUNTER — Other Ambulatory Visit (HOSPITAL_COMMUNITY): Payer: Self-pay | Admitting: Internal Medicine

## 2019-11-03 ENCOUNTER — Other Ambulatory Visit: Payer: Self-pay | Admitting: Obstetrics & Gynecology

## 2019-11-03 DIAGNOSIS — E559 Vitamin D deficiency, unspecified: Secondary | ICD-10-CM

## 2019-11-03 NOTE — Telephone Encounter (Signed)
Medication refill request: Vit D 50000IU Last AEX:  09/05/18 Next AEX: 11/10/19 Last MMG (if hormonal medication request): NA Refill authorized: 6/1

## 2019-11-09 NOTE — Progress Notes (Signed)
79 y.o. G11P2002 Widowed White or Caucasian female here for breast and pelvic exam.  I am also following her for history of breast cancer,  PMP vaginal atrophic changes, hx of OAB and hx of breast biopsy in 2020 and low Vit D.  Was on anastrozole and had a lot of vaginal symptoms.  She took it for about 4 1/2 years.  She feels her symptoms are much better not that she stopped.      H/o pulmonary nodule.  Has been see by Dr. Servando Snare.  Repeat CT scan was recommended last year.  She declined doing it last year.  Had planned to do this in 2021.  Had three surgeries since I saw her last.  She has decided to have this done again.  Is being followed with CXR with Dr. Joya Salm.  I don't have these test results.    Denies vaginal bleeding.  She did have Covid vaccination and has follow up booster scheduled for October. Lives at [place].    Patient's last menstrual period was 02/27/2008.          Sexually active: No.  H/O STD:  no  Health Maintenance: PCP: Burnard Bunting  Last wellness appt was this year  Did blood work at that appt: yes  Vaccines are up to date:  yes Colonoscopy:  2009 f/u 90yrs.  Did a stool test for blood in 2020.  This was negative.   MMG:  Bilateral & rt breast u/s 09/2018, 04/2019 breast u/s, had breast biopsy, see reports.  Knows follow up due.  She has this on her to do list.   BMD:  With pcp Last pap smear:  06-22-14 neg, 04-30-17 neg.   H/o abnormal pap smear: no    reports that she quit smoking about 21 years ago. Her smoking use included cigarettes. She has a 67.50 pack-year smoking history. She has never used smokeless tobacco. She reports current alcohol use. She reports that she does not use drugs.  Past Medical History:  Diagnosis Date  . Anxiety   . Arthritis   . Back pain    Lower Back pain - takes Cortisone shots  . Bone spur 2009   lower back  . Breast cancer ([place]) 2015   finished radiation 12/15  . Bronchitis   . CAD (coronary artery disease)    a. cath  12/2010 for cardiac arrest showed   . Cataract   . Complication of anesthesia    throat irrition from past intubations, intubation granulomas  . Coronary artery disease   . Dyspnea on exertion   . History of cardiac arrest 12/2010  . Hyperlipemia   . Hypertension   . MI (myocardial infarction) ([place]) 12/2010   Was followed by Dr. Pernell Dupre  . Personal history of radiation therapy   . 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 Radiation 01/06/14-02/04/14   left breast   . Rosacea 2008   both eyes    Past Surgical History:  Procedure Laterality Date  . ANTERIOR LAT LUMBAR FUSION N/A 11/18/2018   Procedure: Lumbar One-Two Anterolateral lumbar decompression/fusion/lateral plate fixation;  Surgeon: Kristeen Miss, MD;  Location: [facility];  Service: Neurosurgery;  Laterality: N/A;  Lumbar One-Two Anterolateral lumbar decompression/fusion/lateral plate fixation  . BACK SURGERY    . BILIARY DILATION  05/29/2019   Procedure: BILIARY DILATION;  Surgeon: Clarene Essex, MD;  Location: [facility];  Service: Endoscopy;;  .  BREAST BIOPSY  8/10   fibrocystic changes  . BREAST LUMPECTOMY Left 2015  . BREAST LUMPECTOMY WITH NEEDLE LOCALIZATION AND AXILLARY SENTINEL LYMPH NODE BX Left 11/13/2013   Procedure: NEEDLE LOCALIZATION LEFT BREAST LUMPECTOMY WITH LEFT  AXILLARY SENTINEL LYMPH NODE BX;  Surgeon: Excell Seltzer, MD;  Location: [facility];  Service: General;  Laterality: Left;  . CATARACT EXTRACTION     both  . CHOLECYSTECTOMY N/A 05/30/2019   Procedure: LAPAROSCOPIC CHOLECYSTECTOMY;  Surgeon: Erroll Luna, MD;  Location: [facility];  Service: General;  Laterality: N/A;  . CHOLECYSTECTOMY    . dental implant    . ERCP N/A 05/29/2019   Procedure: ENDOSCOPIC RETROGRADE CHOLANGIOPANCREATOGRAPHY (ERCP);  Surgeon: Clarene Essex, MD;  Location: [facility];  Service: Endoscopy;  Laterality: N/A;  . EYE SURGERY      Laser Eye Surgery approx 2007  . heart stent     failed-no stents  . HYSTEROSCOPY  9/10   D&C (polyp)  . LEFT HEART CATHETERIZATION WITH CORONARY ANGIOGRAM N/A 01/18/2011   Procedure: LEFT HEART CATHETERIZATION WITH CORONARY ANGIOGRAM;  Surgeon: Clent Demark, MD;  Location: [facility] C[facility];  Service: Cardiovascular;  Laterality: N/A;  . PERCUTANEOUS CORONARY STENT INTERVENTION (PCI-S) N/A 01/18/2011   Procedure: PERCUTANEOUS CORONARY STENT INTERVENTION (PCI-S);  Surgeon: Clent Demark, MD;  Location: [facility] CATH LAB;  Service: Cardiovascular;  Laterality: N/A;  . REMOVAL OF STONES  05/29/2019   Procedure: REMOVAL OF STONES;  Surgeon: Clarene Essex, MD;  Location: [facility];  Service: Endoscopy;;  . [name]  05/29/2019   Procedure: [name];  Surgeon: Clarene Essex, MD;  Location: [facility];  Service: Endoscopy;;  . SPINAL FUSION    . TONSILLECTOMY AND ADENOIDECTOMY    . TUBAL LIGATION      Current Outpatient Medications  Medication Sig Dispense Refill  . acetaminophen (TYLENOL) 500 MG tablet Take 1 tablet (500 mg total) by mouth every 6 (six) hours as needed for moderate pain. 20 tablet 0  . Artificial Tear Solution (SOOTHE HYDRATION OP) Place 1 drop into both eyes daily as needed (dry eyes).    Marland Kitchen aspirin EC 81 MG tablet Take 81 mg by mouth daily with supper.    Marland Kitchen atorvastatin (LIPITOR) 40 MG tablet Take 40 mg by mouth at bedtime.     . Budesonide-Formoterol Fumarate (SYMBICORT IN) Inhale 2 puffs into the lungs 2 (two) times daily.    . hydrochlorothiazide (HYDRODIURIL) 25 MG tablet Take 25 mg by mouth daily.     Marland Kitchen lisinopril (PRINIVIL,ZESTRIL) 40 MG tablet Take 40 mg by mouth daily.     Marland Kitchen LORazepam (ATIVAN) 1 MG tablet Take 1 mg by mouth at bedtime.    . metoprolol (LOPRESSOR) 50 MG tablet Take 50 mg by mouth 2 (two) times daily.    Marland Kitchen UNABLE TO FIND 5 mg. Hydrocodone 1/2 in the am & 1/2 in the pm    . Vitamin D, Ergocalciferol, (DRISDOL) 1.25 MG (50000 UNIT) CAPS capsule Take 1  capsule (50,000 Units total) by mouth every 7 (seven) days. TAKE ONE CAPSULE (50,000 UNITS) BY MOUTH EVERY 14 DAYS 12 capsule 4  . NITROSTAT 0.4 MG SL tablet DISSOLVE ONE TABLET UNDER THE TONGUE AS NEEDED FOR CHEST PAIN AS DIRECTED (Patient not taking: Reported on 11/10/2019) 25 tablet 2   No current facility-administered medications for this visit.    Family History  Problem Relation Age of Onset  . Congestive Heart Failure Father   . CVA Father  MI, CAD  . Coronary artery disease Father   . Valvular heart disease Mother   . Hypertension Mother   . Rectal cancer Sister   . Breast cancer Sister   . Breast cancer Maternal Grandmother 90  . Breast cancer Maternal Aunt   . Cancer Sister     Review of Systems  Constitutional: Negative.   HENT: Negative.   Eyes: Negative.   Respiratory: Negative.   Cardiovascular: Negative.   Gastrointestinal: Negative.   Endocrine: Negative.   Genitourinary: Negative.   Musculoskeletal: Negative.   Skin: Negative.   Allergic/Immunologic: Negative.   Neurological: Negative.   Hematological: Negative.   Psychiatric/Behavioral: Negative.     Exam:   BP 120/70   Pulse 70   Resp 16   Ht 5' 6.5" (1.689 m)   Wt 174 lb (78.9 kg)   LMP 02/27/2008   BMI 27.66 kg/m   Height: 5' 6.5" (168.9 cm)  General appearance: alert, cooperative and appears stated age Breasts: left breast with lumpectomy scar and radidation changes, stable, no new masses, LAD, nipple discharge; right breast without masses, skin chnages, LAD, nipple discharge Abdomen: soft, non-tender; bowel sounds normal; no masses,  no organomegaly Lymph nodes: Cervical, supraclavicular, and axillary nodes normal.  No abnormal inguinal nodes palpated Neurologic: Grossly normal  Pelvic: External genitalia:  no lesions              Urethra:  normal appearing urethra with no masses, tenderness or lesions              Bartholins and Skenes: normal                 Vagina: normal  appearing vagina with normal color and discharge, no lesions              Cervix: no lesions              Pap taken: No. Bimanual Exam:  Uterus:  normal size, contour, position, consistency, mobility, non-tender              Adnexa: normal adnexa and no mass, fullness, tenderness               Rectovaginal: Confirms               Anus:  normal sphincter tone, no lesions  Chaperone, Terence Lux, CMA, was present for exam.  A:  Breast and Pelvic exam PMP, no HRT H/o invasive ductal breast cancer ER/PR positive, Stage IA, s/p lumpectomy and radiation, was on anastrozole but stopped after 4 1/2 years due to side effects Breast biopsy 2020, had follow up 04/2019 Family hx of breast cancer in [place] at age 16 and sister COPD, lung nodule, hypertension, elevated lipids, h/o CAD Vit D deficiency (37 last year on prescription)  P:   Mammogram is due.  Pt aware.  Does not want help scheduling and states she will get this done. pap smears are not indicated for screening Colon cancer screening discussed.  Doing stool tests for blood. Will not obtain Vit D level today.  Plan to repeat next year.  Rx for 50K weekly to pharmacy.   Vaccines UTD except possibly Tdap, she is going to check records Dr. Joya Salm and Dr. Servando Snare are managing other medical concerns Return annually for breast and pelvic or prn new issues/concerns  In additional to breast and pelvic exam, 25 minutes spent with pt regarding above issues/concerns.

## 2019-11-10 ENCOUNTER — Encounter: Payer: Self-pay | Admitting: Obstetrics & Gynecology

## 2019-11-10 ENCOUNTER — Other Ambulatory Visit: Payer: Self-pay

## 2019-11-10 ENCOUNTER — Ambulatory Visit (INDEPENDENT_AMBULATORY_CARE_PROVIDER_SITE_OTHER): Payer: Medicare Other | Admitting: Obstetrics & Gynecology

## 2019-11-10 VITALS — BP 120/70 | HR 70 | Resp 16 | Ht 66.5 in | Wt 174.0 lb

## 2019-11-10 DIAGNOSIS — Z9189 Other specified personal risk factors, not elsewhere classified: Secondary | ICD-10-CM | POA: Diagnosis not present

## 2019-11-10 DIAGNOSIS — E559 Vitamin D deficiency, unspecified: Secondary | ICD-10-CM | POA: Diagnosis not present

## 2019-11-10 DIAGNOSIS — Z01419 Encounter for gynecological examination (general) (routine) without abnormal findings: Secondary | ICD-10-CM | POA: Diagnosis not present

## 2019-11-10 MED ORDER — VITAMIN D (ERGOCALCIFEROL) 1.25 MG (50000 UNIT) PO CAPS: 50000.0000 [IU] | ORAL_CAPSULE | ORAL | 4 refills | Status: DC

## 2019-11-10 NOTE — Patient Instructions (Addendum)
Please check your records to see if your tetanus is up to date.  The last one was in 2006.

## 2019-11-12 ENCOUNTER — Telehealth: Payer: Self-pay | Admitting: Obstetrics & Gynecology

## 2019-11-12 NOTE — Telephone Encounter (Signed)
Rep calling from [place] to verify the directions for Vit D. -Please return call to [phone].

## 2019-11-12 NOTE — Telephone Encounter (Signed)
Called pharmacy and advised that script should be taken every other week per Dr Sanjuan Dame notes  [place]

## 2019-11-13 ENCOUNTER — Telehealth: Payer: Self-pay | Admitting: *Deleted

## 2019-11-13 NOTE — Telephone Encounter (Signed)
Patient in recall for 09-2019 for 5 month follow up breast imaging. Per review of records, patient was advised at [facility] on 11-10-19 that MMG was overdue. Patient declined needing office help to get scheduled.   Dr. Sabra Heck okay to extend recall to allow patient time to get this scheduled?

## 2019-11-13 NOTE — Telephone Encounter (Signed)
Yes.  Ok to extend recall for 2 more months.

## 2019-11-13 NOTE — Telephone Encounter (Signed)
Recall extended to 12-2019 per Dr. Sabra Heck.   Encounter closed.

## 2019-11-16 ENCOUNTER — Other Ambulatory Visit (HOSPITAL_COMMUNITY): Payer: Self-pay | Admitting: Internal Medicine

## 2019-11-27 ENCOUNTER — Other Ambulatory Visit: Payer: Self-pay | Admitting: Hematology and Oncology

## 2019-11-27 DIAGNOSIS — Z1231 Encounter for screening mammogram for malignant neoplasm of breast: Secondary | ICD-10-CM

## 2019-12-03 NOTE — Progress Notes (Signed)
Cardiology Office Note:    Date:  12/07/2019   ID:  Rachel Chase, DOB 1941-01-03, MRN [id number]  PCP:  Burnard Bunting, MD  Cardiologist:  Sinclair Grooms, MD   Referring MD: Burnard Bunting, MD   Chief Complaint  Patient presents with   Coronary Artery Disease    History of Present Illness:    Rachel Chase is a 79 y.o. 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   Since last being seen, the patient had emergency cholecystectomy and ERCP for choledocholithiasis.  She did not have any significant cardiac complications.  She has done well since that time.  Physical activity is limited by L5-S1 lumbar disc disease.  She has also had lumbar fusion procedures at higher levels.  This does limit activity.  She has not had chest pain, orthopnea, PND, palpitations, or syncope.  She discontinued smoking in 1999.  Past Medical History:  Diagnosis Date   Anxiety    Arthritis    Back pain    Lower Back pain - takes Cortisone shots   Bone spur 2009   lower back   Breast cancer ([place]) 2015   finished radiation 12/15   Bronchitis    CAD (coronary artery disease)    a. cath 12/2010 for cardiac arrest showed    Cataract    Complication of anesthesia    throat irrition from past intubations, intubation granulomas   Coronary artery disease    Dyspnea on exertion    History of cardiac arrest 12/2010   Hyperlipemia    Hypertension    MI (myocardial infarction) ([place]) 12/2010   Was followed by Dr. 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 (followed by Lanelle Bal, MD)   Radiation 01/06/14-02/04/14   left breast    Rosacea 2008   both eyes    Past Surgical History:  Procedure Laterality Date   ANTERIOR LAT LUMBAR  FUSION N/A 11/18/2018   Procedure: Lumbar One-Two Anterolateral lumbar decompression/fusion/lateral plate fixation;  Surgeon: Kristeen Miss, MD;  Location: [place] ;  Service: Neurosurgery;  Laterality: N/A;  Lumbar One-Two Anterolateral lumbar decompression/fusion/lateral plate fixation   BACK SURGERY     BILIARY DILATION  05/29/2019   Procedure: BILIARY DILATION;  Surgeon: Clarene Essex, MD;  Location: [facility];  Service: Endoscopy;;   BREAST BIOPSY  8/10   fibrocystic changes   BREAST LUMPECTOMY Left 2015   BREAST LUMPECTOMY WITH NEEDLE LOCALIZATION AND AXILLARY SENTINEL LYMPH NODE BX Left 11/13/2013   Procedure: NEEDLE LOCALIZATION LEFT BREAST LUMPECTOMY WITH LEFT  AXILLARY SENTINEL LYMPH NODE BX;  Surgeon: Excell Seltzer, MD;  Location: [place];  Service: General;  Laterality: Left;   CATARACT EXTRACTION     both   CHOLECYSTECTOMY N/A 05/30/2019   Procedure: LAPAROSCOPIC CHOLECYSTECTOMY;  Surgeon: Erroll Luna, MD;  Location: [facility];  Service: General;  Laterality: N/A;   CHOLECYSTECTOMY     dental implant     ERCP N/A 05/29/2019   Procedure: ENDOSCOPIC RETROGRADE CHOLANGIOPANCREATOGRAPHY (ERCP);  Surgeon: Clarene Essex, MD;  Location: [facility];  Service: Endoscopy;  Laterality: N/A;   EYE SURGERY     Laser Eye Surgery approx 2007   heart stent     failed-no stents   HYSTEROSCOPY  9/10   D&C (polyp)   LEFT HEART CATHETERIZATION WITH CORONARY ANGIOGRAM N/A 01/18/2011   Procedure: LEFT HEART CATHETERIZATION WITH CORONARY ANGIOGRAM;  Surgeon: Clent Demark, MD;  Location: [place] [facility];  Service: Cardiovascular;  Laterality: N/A;   PERCUTANEOUS CORONARY STENT INTERVENTION (PCI-S) N/A 01/18/2011   Procedure: PERCUTANEOUS CORONARY STENT INTERVENTION (PCI-S);  Surgeon: Clent Demark, MD;  Location: [facility];  Service: Cardiovascular;  Laterality: N/A;   REMOVAL OF STONES  05/29/2019   Procedure: REMOVAL OF STONES;  Surgeon: Clarene Essex, MD;  Location:  [facility];  Service: Endoscopy;;   SPHINCTEROTOMY  05/29/2019   Procedure: [name];  Surgeon: Clarene Essex, MD;  Location: [facility];  Service: Endoscopy;;   SPINAL FUSION     TONSILLECTOMY AND ADENOIDECTOMY     TUBAL LIGATION      Current Medications: Current Meds  Medication Sig   Artificial Tear Solution (SOOTHE HYDRATION OP) Place 1 drop into both eyes daily as needed (dry eyes).   aspirin EC 81 MG tablet Take 81 mg by mouth daily with supper.   atorvastatin (LIPITOR) 40 MG tablet Take 40 mg by mouth at bedtime.    Budesonide-Formoterol Fumarate (SYMBICORT IN) Inhale 2 puffs into the lungs 2 (two) times daily.   hydrochlorothiazide (HYDRODIURIL) 25 MG tablet Take 25 mg by mouth daily.    HYDROcodone-acetaminophen (NORCO/VICODIN) 5-325 MG tablet Take 0.5 tablets by mouth 2 (two) times daily.   lisinopril (PRINIVIL,ZESTRIL) 40 MG tablet Take 40 mg by mouth daily.    LORazepam (ATIVAN) 1 MG tablet Take 1 mg by mouth at bedtime.   metoprolol (LOPRESSOR) 50 MG tablet Take 50 mg by mouth 2 (two) times daily.   NITROSTAT 0.4 MG SL tablet DISSOLVE ONE TABLET UNDER THE TONGUE AS NEEDED FOR CHEST PAIN AS DIRECTED   Vitamin D, Ergocalciferol, (DRISDOL) 1.25 MG (50000 UNIT) CAPS capsule Take 1 capsule (50,000 Units total) by mouth every 7 (seven) days. TAKE ONE CAPSULE (50,000 UNITS) BY MOUTH EVERY 14 DAYS     Allergies:   Dilaudid [hydromorphone hcl] and Penicillins   Social History   Socioeconomic History   Marital status: Widowed    Spouse name: Not on file   Number of children: 3   Years of education: Not on file   Highest education level: Not on file  Occupational History   Not on file  Tobacco Use   Smoking status: Former Smoker    Packs/day: 1.50    Years: 45.00    Pack years: 67.50    Types: Cigarettes    Quit date: 04/17/1998    Years since quitting: 21.6   Smokeless tobacco: Never Used   Tobacco comment: nicorette gum  Vaping Use    Vaping Use: Never used  Substance and Sexual Activity   Alcohol use: Yes    Comment: 1-2 a month   Drug use: No   Sexual activity: Not Currently    Partners: Male    Birth control/protection: Post-menopausal  Other Topics Concern   Not on file  Social History Narrative   Not on file   Social Determinants of Health   Financial Resource Strain:    Difficulty of Paying Living Expenses: Not on file  Food Insecurity:    Worried About Charity fundraiser in the Last Year: Not on file   [employer] of Food in the Last Year: Not on file  Transportation Needs:    Lack of Transportation (Medical): Not on file   Lack of  Transportation (Non-Medical): Not on file  Physical Activity:    Days of Exercise per Week: Not on file   Minutes of Exercise per Session: Not on file  Stress:    Feeling of Stress : Not on file  Social Connections:    Frequency of Communication with Friends and Family: Not on file   Frequency of Social Gatherings with Friends and Family: Not on file   Attends Religious Services: Not on file   Active Member of Clubs or Organizations: Not on file   Attends Archivist Meetings: Not on file   Marital Status: Not on file     Family History: The patient's family history includes Breast cancer in her maternal aunt and sister; Breast cancer (age of onset: 51) in her maternal grandmother; CVA in her father; Cancer in her sister; Congestive Heart Failure in her father; Coronary artery disease in her father; Hypertension in her mother; Rectal cancer in her sister; Valvular heart disease in her mother.  ROS:   Please see the history of present illness.    Chronic back pain.  Uses hydrocodone 1 tablet in the morning and 1/2 tablet in the evening.  This is controlled by Dr. Reynaldo Minium.  All other systems reviewed and are negative.  EKGs/Labs/Other Studies Reviewed:    The following studies were reviewed today:  2D Doppler echocardiogram 2018 Study  Conclusions   - Left ventricle: The cavity size was normal. Wall thickness was  normal. Systolic function was normal. The estimated ejection  fraction was in the range of 55% to 60%. Wall motion was normal;  there were no regional wall motion abnormalities. Doppler  parameters are consistent with abnormal left ventricular  relaxation (grade 1 diastolic dysfunction).  - Mitral valve: Calcified annulus. There was mild regurgitation.   Impressions:   - Normal LV systolic function; mild diastolic dysfunction; mild MR.    EKG:  EKG performed May 27, 2019 prior to emergency cholecystectomy demonstrated normal sinus rhythm, nonspecific ST abnormality, early QRS transition.  No change from prior tracings.  Recent Labs: 05/28/2019: Magnesium 1.9 05/31/2019: ALT 291; BUN 8; Creatinine, Ser 0.64; Hemoglobin 11.3; Platelets 189; Potassium 3.6; Sodium 140  Recent Lipid Panel    Component Value Date/Time   CHOL 128 01/18/2011 2000   TRIG 103 01/18/2011 2000   HDL 47 01/18/2011 2000   CHOLHDL 2.7 01/18/2011 2000   VLDL 21 01/18/2011 2000   [place] 60 01/18/2011 2000    Physical Exam:    VS:  BP (!) 108/52    Pulse 62    Ht 5\' 8"  (1.727 m)    Wt 173 lb (78.5 kg)    LMP 02/27/2008    SpO2 96%    BMI 26.30 kg/m     Wt Readings from Last 3 Encounters:  12/07/19 173 lb (78.5 kg)  11/10/19 174 lb (78.9 kg)  05/30/19 184 lb 11.9 oz (83.8 kg)     GEN: Compatible with age. No acute distress HEENT: Normal NECK: No JVD. LYMPHATICS: No lymphadenopathy CARDIAC:  RRR without murmur, gallop, or edema. VASCULAR:  Normal Pulses. No bruits. RESPIRATORY:  Clear to auscultation without rales, wheezing or rhonchi  ABDOMEN: Soft, non-tender, non-distended, No pulsatile mass, MUSCULOSKELETAL: No deformity  SKIN: Warm and dry NEUROLOGIC:  Alert and oriented x 3 PSYCHIATRIC:  Normal affect   ASSESSMENT:    1. Coronary artery disease involving native coronary artery of native heart without  angina pectoris   2. Pure hypercholesterolemia   3. Essential (primary)  hypertension   4. Dyspnea on exertion   5. Educated about COVID-19 virus infection    PLAN:    In order of problems listed above:  1. Stable.  Risk mitigation is being practiced with high intensity statin therapy and antihypertensive therapy.  She is falling short of physical activity, mostly because of chronic back pain. 2. Continue atorvastatin 40 mg/day.  Last LDL was 94.  Encouraged her to decrease saturated fat in her diet. 3. Continue HydroDIURIL 25 mg/day, Zestril 40 mg/day, Lopressor 50 mg twice daily. 4. Dyspnea on exertion is no longer an issue.  I did encourage increased physical activity. 5. She is vaccinated and practicing medication.   Medication Adjustments/Labs and Tests Ordered: Current medicines are reviewed at length with the patient today.  Concerns regarding medicines are outlined above.  No orders of the defined types were placed in this encounter.  No orders of the defined types were placed in this encounter.   Patient Instructions  Medication Instructions:  Your physician recommends that you continue on your current medications as directed. Please refer to the Current Medication list given to you today.  *If you need a refill on your cardiac medications before your next appointment, please call your pharmacy*   Lab Work: None If you have labs (blood work) drawn today and your tests are completely normal, you will receive your results only by:  [place] (if you have MyChart) OR  A paper copy in the mail If you have any lab test that is abnormal or we need to change your treatment, we will call you to review the results.   Testing/Procedures: None   Follow-Up: At [facility], you and your health needs are our priority.  As part of our continuing mission to provide you with exceptional heart care, we have created designated Provider Care Teams.  These Care Teams include  your primary Cardiologist (physician) and Advanced Practice Providers (APPs -  Physician Assistants and Nurse Practitioners) who all work together to provide you with the care you need, when you need it.  We recommend signing up for the patient portal called "MyChart".  Sign up information is provided on this After Visit Summary.  MyChart is used to connect with patients for Virtual Visits (Telemedicine).  Patients are able to view lab/test results, encounter notes, upcoming appointments, etc.  Non-urgent messages can be sent to your provider as well.   To learn more about what you can do with MyChart, go to [email or web address].    Your next appointment:   12 month(s)  The format for your next appointment:   In Person  Provider:   You may see Sinclair Grooms, MD or one of the following Advanced Practice Providers on your designated Care Team:    Truitt Merle, NP  Cecilie Kicks, NP  Kathyrn Drown, NP    Other Instructions      Signed, Sinclair Grooms, MD  12/07/2019 11:04 AM    [place]

## 2019-12-07 ENCOUNTER — Other Ambulatory Visit: Payer: Self-pay

## 2019-12-07 ENCOUNTER — Encounter: Payer: Self-pay | Admitting: Interventional Cardiology

## 2019-12-07 ENCOUNTER — Ambulatory Visit: Payer: Medicare Other | Admitting: Interventional Cardiology

## 2019-12-07 VITALS — BP 108/52 | HR 62 | Ht 68.0 in | Wt 173.0 lb

## 2019-12-07 DIAGNOSIS — I10 Essential (primary) hypertension: Secondary | ICD-10-CM | POA: Diagnosis not present

## 2019-12-07 DIAGNOSIS — I2510 Atherosclerotic heart disease of native coronary artery without angina pectoris: Secondary | ICD-10-CM | POA: Diagnosis not present

## 2019-12-07 DIAGNOSIS — E7800 Pure hypercholesterolemia, unspecified: Secondary | ICD-10-CM

## 2019-12-07 DIAGNOSIS — R0600 Dyspnea, unspecified: Secondary | ICD-10-CM

## 2019-12-07 DIAGNOSIS — Z7189 Other specified counseling: Secondary | ICD-10-CM

## 2019-12-07 DIAGNOSIS — R0609 Other forms of dyspnea: Secondary | ICD-10-CM

## 2019-12-07 NOTE — Patient Instructions (Signed)

## 2019-12-24 ENCOUNTER — Ambulatory Visit: Payer: Medicare Other

## 2019-12-28 ENCOUNTER — Other Ambulatory Visit (HOSPITAL_COMMUNITY): Payer: Self-pay | Admitting: Internal Medicine

## 2020-01-07 ENCOUNTER — Other Ambulatory Visit (HOSPITAL_COMMUNITY): Payer: Self-pay | Admitting: Internal Medicine

## 2020-02-03 ENCOUNTER — Ambulatory Visit
Admission: RE | Admit: 2020-02-03 | Discharge: 2020-02-03 | Disposition: A | Discharge status: Home / Self Care | Payer: Medicare Other | Source: Ambulatory Visit | Attending: Hematology and Oncology | Admitting: Hematology and Oncology

## 2020-02-03 ENCOUNTER — Other Ambulatory Visit: Payer: Self-pay

## 2020-02-03 DIAGNOSIS — Z1231 Encounter for screening mammogram for malignant neoplasm of breast: Secondary | ICD-10-CM

## 2020-03-07 ENCOUNTER — Other Ambulatory Visit (HOSPITAL_COMMUNITY): Payer: Self-pay | Admitting: Internal Medicine

## 2020-03-15 ENCOUNTER — Telehealth: Payer: Self-pay | Admitting: *Deleted

## 2020-03-15 ENCOUNTER — Other Ambulatory Visit: Payer: Self-pay | Admitting: Neurological Surgery

## 2020-03-15 NOTE — Telephone Encounter (Signed)
   Primary Cardiologist: Sinclair Grooms, MD  Chart reviewed as part of pre-operative protocol coverage. Miss EVAMAE ROWEN has a hx of CAD with known chronic total occusion of RCA after cardiac arrest in 2012, HTN, HLD, COPD. Nuclear stress test 08/24/16 low risk study, EF 71%. Last seen 12/07/19 by Dr. Tamala Julian with no anginal symptoms and physical activity limited by L5-S1 lumbar disc disease.   Of note, she had L1-2 anterolateral decompression and lumbar interbody fusion 10/2018 which she tolerated well.  Will route to primary cardiologist, Dr. Tamala Julian, for recommendations regarding holding Aspirin.  Once received, will contact patient regarding recommendations and to assess for any anginal symptoms.   Loel Dubonnet, NP

## 2020-03-15 NOTE — Telephone Encounter (Signed)
   [facility] Pre-operative Risk Assessment    HEARTCARE STAFF: - Please ensure there is not already an duplicate clearance open for this procedure. - Under Visit Info/Reason for Call, type in Other and utilize the format Clearance MM/DD/YY or Clearance TBD. Do not use dashes or single digits. - If request is for dental extraction, please clarify the # of teeth to be extracted.  Request for surgical clearance:  1. What type of surgery is being performed?  L5-S1 POSTERIOR LUMBAR INFUSION WITH FIXATION TO PELVIS   2. When is this surgery scheduled? 03/30/2020   3. What type of clearance is required (medical clearance vs. Pharmacy clearance to hold med vs. Both)?  BOTH  4. Are there any medications that need to be held prior to surgery and how long? ASPIRIN   5. Practice name and name of physician performing surgery?  [facility]   6. What is the office phone number?  [phone]   7.   What is the office fax number?  [phone]  8.   Anesthesia type (None, local, MAC, general) ? CHOICE   Jeanann Lewandowsky 03/15/2020, 12:48 PM  _________________________________________________________________   (provider comments below)

## 2020-03-17 NOTE — Telephone Encounter (Signed)
Hold based upon recommendation of neurosurgery. Can hold any length they feel most comfortable with.

## 2020-03-18 NOTE — Telephone Encounter (Signed)
   Primary Cardiologist: Sinclair Grooms, MD  Chart reviewed as part of pre-operative protocol coverage. Given past medical history and time since last visit, based on ACC/AHA guidelines, Rachel Chase would be at acceptable risk for the planned procedure without further cardiovascular testing.   Patient was advised that if she develops new symptoms prior to surgery to contact our office to arrange a follow-up appointment.  He verbalized understanding.  Her aspirin may be held for 5-7 days prior to her surgery.  Please resume as soon as hemostasis is achieved.  I will route this recommendation to the requesting party via Epic fax function and remove from pre-op pool.  Please call with questions.  Jossie Ng. Aveer Bartow NP-C    03/18/2020, 9:14 AM [place] Office [phone] Fax [phone]

## 2020-03-18 NOTE — Telephone Encounter (Signed)
Patient contacted.  She continues to do well and has no cardiac complaints.  She states that she has been contacted and given recommendations on holding her aspirin.

## 2020-04-18 ENCOUNTER — Other Ambulatory Visit (HOSPITAL_COMMUNITY): Payer: Self-pay | Admitting: Internal Medicine

## 2020-04-19 NOTE — Pre-Procedure Instructions (Addendum)
Surgical Instructions    Your procedure is scheduled on Monday, February 28th.  Report to [facility] Main Entrance "A" at 8:30 A.M., then check in with the Admitting office.  Call this number if you have problems the morning of surgery:  [phone]   If you have any questions prior to your surgery date call [phone]: Open Monday-Friday 8am-4pm    Remember:  Do not eat or drink after midnight the night before your surgery    Take these medicines the morning of surgery with A SIP OF WATER  budesonide-formoterol (SYMBICORT)  HYDROcodone-acetaminophen (NORCO/VICODIN) metoprolol (LOPRESSOR)  Albuterol inhaler-use as needed. Please bring with you on the day of surgery. Artificial tears-use as needed. NITROSTAT-as needed. Please let a nurse know if you had to use this.   Per your cardiologist, STOP taking Aspirin 5-7 days prior to surgery.  As of today, Aleve, Naproxen, Ibuprofen, Motrin, Advil, Goody's, BC's, all herbal medications, fish oil, and all vitamins.                     Do not wear jewelry, make up, or nail polish            Do not wear lotions, powders, perfumes, or deodorant.            Do not shave 48 hours prior to surgery.              Do not bring valuables to the hospital.            [facility] is not responsible for any belongings or valuables.  Do NOT Smoke (Tobacco/Vaping) or drink Alcohol 24 hours prior to your procedure If you use a CPAP at night, you may bring all equipment for your overnight stay.   Contacts, glasses, dentures or bridgework may not be worn into surgery, please bring cases for these belongings   For patients admitted to the hospital, discharge time will be determined by your treatment team.   Patients discharged the day of surgery will not be allowed to drive home, and someone needs to stay with them for 24 hours.    Special instructions:   - Preparing For Surgery  Before surgery, you can play an important role. Because  skin is not sterile, your skin needs to be as free of germs as possible. You can reduce the number of germs on your skin by washing with CHG (chlorahexidine gluconate) Soap before surgery.  CHG is an antiseptic cleaner which kills germs and bonds with the skin to continue killing germs even after washing.    Oral Hygiene is also important to reduce your risk of infection.  Remember - BRUSH YOUR TEETH THE MORNING OF SURGERY WITH YOUR REGULAR TOOTHPASTE  Please do not use if you have an allergy to CHG or antibacterial soaps. If your skin becomes reddened/irritated stop using the CHG.  Do not shave (including legs and underarms) for at least 48 hours prior to first CHG shower. It is OK to shave your face.  Please follow these instructions carefully.   1. Shower the NIGHT BEFORE SURGERY and the MORNING OF SURGERY  2. If you chose to wash your hair, wash your hair first as usual with your normal shampoo.  3. After you shampoo, rinse your hair and body thoroughly to remove the shampoo.  4. Use CHG Soap as you would any other liquid soap. You can apply CHG directly to the skin and wash gently with a scrungie or a  clean washcloth.   5. Apply the CHG Soap to your body ONLY FROM THE NECK DOWN.  Do not use on open wounds or open sores. Avoid contact with your eyes, ears, mouth and genitals (private parts). Wash Face and genitals (private parts)  with your normal soap.   6. Wash thoroughly, paying special attention to the area where your surgery will be performed.  7. Thoroughly rinse your body with warm water from the neck down.  8. DO NOT shower/wash with your normal soap after using and rinsing off the CHG Soap.  9. Pat yourself dry with a CLEAN TOWEL.  10. Wear CLEAN PAJAMAS to bed the night before surgery  11. Place CLEAN SHEETS on your bed the night before your surgery  12. DO NOT SLEEP WITH PETS.   Day of Surgery: Wear Clean/Comfortable clothing the morning of surgery Do not apply any  deodorants/lotions.   Remember to brush your teeth WITH YOUR REGULAR TOOTHPASTE.   Please read over the following fact sheets that you were given.

## 2020-04-20 ENCOUNTER — Other Ambulatory Visit: Payer: Self-pay

## 2020-04-20 ENCOUNTER — Encounter (HOSPITAL_COMMUNITY): Payer: Self-pay

## 2020-04-20 ENCOUNTER — Encounter (HOSPITAL_COMMUNITY)
Admission: RE | Admit: 2020-04-20 | Discharge: 2020-04-20 | Disposition: A | Discharge status: Home / Self Care | Payer: Medicare Other | Source: Ambulatory Visit | Attending: Neurological Surgery | Admitting: Neurological Surgery

## 2020-04-20 DIAGNOSIS — I10 Essential (primary) hypertension: Secondary | ICD-10-CM | POA: Insufficient documentation

## 2020-04-20 DIAGNOSIS — Z79899 Other long term (current) drug therapy: Secondary | ICD-10-CM | POA: Diagnosis not present

## 2020-04-20 DIAGNOSIS — I2510 Atherosclerotic heart disease of native coronary artery without angina pectoris: Secondary | ICD-10-CM | POA: Diagnosis not present

## 2020-04-20 DIAGNOSIS — M5416 Radiculopathy, lumbar region: Secondary | ICD-10-CM | POA: Insufficient documentation

## 2020-04-20 DIAGNOSIS — I252 Old myocardial infarction: Secondary | ICD-10-CM | POA: Diagnosis not present

## 2020-04-20 DIAGNOSIS — Z01812 Encounter for preprocedural laboratory examination: Secondary | ICD-10-CM | POA: Diagnosis present

## 2020-04-20 DIAGNOSIS — F419 Anxiety disorder, unspecified: Secondary | ICD-10-CM | POA: Diagnosis not present

## 2020-04-20 DIAGNOSIS — Z7982 Long term (current) use of aspirin: Secondary | ICD-10-CM | POA: Diagnosis not present

## 2020-04-20 DIAGNOSIS — Z87891 Personal history of nicotine dependence: Secondary | ICD-10-CM | POA: Diagnosis not present

## 2020-04-20 DIAGNOSIS — Z7901 Long term (current) use of anticoagulants: Secondary | ICD-10-CM | POA: Diagnosis not present

## 2020-04-20 LAB — BASIC METABOLIC PANEL
Anion gap: 11 (ref 5–15)
BUN: 16 mg/dL (ref 8–23)
CO2: 29 mmol/L (ref 22–32)
Calcium: 9.2 mg/dL (ref 8.9–10.3)
Chloride: 100 mmol/L (ref 98–111)
Creatinine, Ser: 0.78 mg/dL (ref 0.44–1.00)
GFR, Estimated: >60 mL/min (ref >60)
Glucose, Bld: 103 mg/dL — ABNORMAL HIGH (ref 70–99)
Potassium: 4.1 mmol/L (ref 3.5–5.1)
Sodium: 140 mmol/L (ref 135–145)

## 2020-04-20 LAB — CBC
HCT: 43.9 % (ref 36.0–46.0)
Hemoglobin: 14.3 g/dL (ref 12.0–15.0)
MCH: 29.2 pg (ref 26.0–34.0)
MCHC: 32.6 g/dL (ref 30.0–36.0)
MCV: 89.8 fL (ref 80.0–100.0)
Platelets: 295 10*3/uL (ref 150–400)
RBC: 4.89 MIL/uL (ref 3.87–5.11)
RDW: 13.2 % (ref 11.5–15.5)
WBC: 9.1 10*3/uL (ref 4.0–10.5)
nRBC: 0 % (ref 0.0–0.2)

## 2020-04-20 LAB — SURGICAL PCR SCREEN
MRSA, PCR: NEGATIVE
Staphylococcus aureus: NEGATIVE

## 2020-04-20 NOTE — Progress Notes (Signed)
PCP - Dr. Burnard Bunting Cardiologist - Dr. Daneen Schick  PPM/ICD - denies  Chest x-ray - N/A EKG - 05/27/2019 Stress Test - 08/24/16 ECHO - 08/24/16 Cardiac Cath - 2012????  Sleep Study - denies CPAP - N/A  DM: denis  Blood Thinner Instructions: N/A Aspirin Instructions: per patient last dose taken on 04/16/2020  ERAS Protcol - No  COVID TEST- Scheduled for 04/25/2020. Patient verbalized understanding of self-quarantine instructions, appointment time and place.   Anesthesia review: YES, hx of CAD, cardiac clearance  Patient denies shortness of breath, fever, cough and chest pain at PAT appointment  All instructions explained to the patient, with a verbal understanding of the material. Patient agrees to go over the instructions while at home for a better understanding. Patient also instructed to self quarantine after being tested for COVID-19. The opportunity to ask questions was provided.

## 2020-04-21 NOTE — Progress Notes (Signed)
Anesthesia Chart Review:  Case: [id number] Date/Time: 04/25/20 1022   Procedure: Lumbar 5 Sacral 1 Posterior lumbar interbody fusion with fixation to the pelvis (N/A Back) - 3C/RM 21   Anesthesia type: General   Pre-op diagnosis: Radiculopathy, Lumbar region   Location: [facility] OR ROOM 21 / [facility]   Surgeons: Kristeen Miss, MD      DISCUSSION: Patient is a 80 year old female scheduled for the above procedure.  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 She reports she has had throat irritation following previous intubations.   Last visit with cardiologist Dr. Tamala Julian 12/07/19. Preoperative cardiology input as outlined by Coletta Memos, NP on 03/18/20: "Given past medical history and time since last visit, based on ACC/AHA guidelines, Rachel Chase would be at acceptable risk for the planned procedure without further cardiovascular testing.Marland KitchenMarland KitchenHer aspirin may be held for 5-7 days prior to her surgery.  Please resume as soon as hemostasis is achieved." She reported last ASA 04/16/20.    Has received Moderna COVID-19 vaccine. Presurgical COVID-19 test is scheduled for 04/22/20. Anesthesia team to evaluate on the day of surgery.    VS: BP 132/61   Pulse 66   Temp 36.8 C (Oral)   Resp 18   Ht 5\' 8"  (1.727 m)   Wt 80.2 kg   LMP 02/27/2008   SpO2 97%   BMI 26.90 kg/m    PROVIDERS: Burnard Bunting, MD is PCP  - Daneen Schick, MD is cardiologist. Last visit 12/07/19. CAD felt stable. Activity limited by chronic back pain. 1 year follow-up planned. Nicholas Lose, MD is HEM-ONC. Last visit 07/08/18 but screening mammogram 02/03/20.  Servando Snare, Edward,MD is CT surgeon (following LLL lung nodule). Last visit 07/16/17. 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 Given her severe COPD, surgical lung resection would be "risky". He discussed treatment options including biopsy, but she declined any surgical resection. one year follow-up was planned, but on 07/08/18, she told Dr. Lindi Adie that she was cancelling her 2020 visit with Dr. Servando Snare. (11/10/19 notes suggest, she opted for CXR per Dr. Reynaldo Minium instead of chest CT in 2021.) - Hale Bogus, MD is GYN   LABS: Labs reviewed: Acceptable for surgery. (all labs ordered are listed, but only abnormal results are displayed)  Labs Reviewed  BASIC METABOLIC PANEL - Abnormal; Notable for the following components:      Result Value   Glucose, Bld 103 (*)    All other components within normal limits  SURGICAL PCR SCREEN  CBC  TYPE AND SCREEN    PFTs 06/29/16:  FVC 1.70 (51%), post 2.22 (66%), FEV1 0.89 (35%), post 1.15 (45%). 28% improvement with bronchodilators. DLCO 9.17 (30%) Interpretation: The FVC, FEV1, FEV1/FVC ratio and FEF25-75% are reduced indicating airway obstruction. While the TLC is within normal limits, the FRC, RV and RV/TLC ratio are increased. Following administration of bronchodilators, there is a significant response indicated by the increased FVC. The reduced diffusing capacity indicates a severe loss of functional alveolar capillary  surface. However, the diffusing capacity was not corrected for the patient's hemoglobin. Pulmonary Function Diagnosis: Severe Obstructive Airways Disease -Reversible component Severe Diffusion Defect   EKG: 05/27/19: Sinus  rhythm Low voltage, precordial leads Abnormal R-wave progression, early transition Confirmed by Virgel Manifold [phone]) on 05/27/2019 5:31:51 PM   CV: Echo 08/24/16: Study Conclusions - Left ventricle: The cavity size was normal. Wall thickness was normal. Systolic function was normal. The estimated ejection fraction was in the range of 55% to 60%. Wall motion was normal; there were no regional wall motion abnormalities. Doppler parameters are consistent with abnormal left ventricular relaxation (grade 1 diastolic dysfunction). - Mitral valve: Calcified annulus. There was mild regurgitation. Impressions: - Normal LV systolic function; mild diastolic dysfunction; mild MR.  Nuclear stress test 08/24/16: Study Highlights  Nuclear stress EF: 71%.  There was no ST segment deviation noted during stress.  The study is normal.  This is a low risk study.  The left ventricular ejection fraction is hyperdynamic (>65%). Normal Lexiscan nuclear stress test with no evidence for prior infarct or ischemia.  Cardiac cath 01/18/2011:  Her left main was short which was patent. LAD has 20-30% proximal and 25-35% mid sequential stenosis. Left circumflex has proximal 30% stenosis. OM-1 and 2 are very small. OM-3 is large which is patent. 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 Attempts at PCI to RCA were unsuccessful.   Past Medical History:  Diagnosis Date  . Anxiety   . Arthritis   . Back pain    Lower Back pain - takes Cortisone shots  . Bone spur 2009   lower back  . Breast cancer ([place]) 2015   finished radiation 12/15  . Bronchitis   . CAD (coronary artery disease)    a. cath 12/2010 for cardiac arrest showed   . Cataract   . Complication of anesthesia    throat irrition from past intubations, intubation granulomas  . Coronary artery disease   . Dyspnea on exertion   . History of cardiac arrest 12/2010  .  Hyperlipemia   . Hypertension   . MI (myocardial infarction) ([place]) 12/2010   Was followed by Dr. Pernell Dupre  . Personal history of radiation therapy   . 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 Radiation 01/06/14-02/04/14   left breast   . Rosacea 2008   both eyes    Past Surgical History:  Procedure Laterality Date  . ANTERIOR LAT LUMBAR FUSION N/A 11/18/2018   Procedure: Lumbar One-Two Anterolateral lumbar decompression/fusion/lateral plate fixation;  Surgeon: Kristeen Miss, MD;  Location: [place];  Service: Neurosurgery;  Laterality: N/A;  Lumbar One-Two Anterolateral lumbar decompression/fusion/lateral plate fixation  . BACK SURGERY    . BILIARY DILATION  05/29/2019   Procedure: BILIARY DILATION;  Surgeon: Clarene Essex, MD;  Location: [facility];  Service: Endoscopy;;  . BREAST BIOPSY  8/10   fibrocystic changes  . BREAST LUMPECTOMY Left 2015  . BREAST LUMPECTOMY WITH NEEDLE LOCALIZATION AND AXILLARY SENTINEL LYMPH NODE BX Left 11/13/2013   Procedure: NEEDLE LOCALIZATION LEFT BREAST LUMPECTOMY WITH LEFT  AXILLARY SENTINEL LYMPH NODE BX;  Surgeon: Excell Seltzer, MD;  Location: [place];  Service: General;  Laterality: Left;  . CATARACT EXTRACTION     both  . CHOLECYSTECTOMY N/A 05/30/2019   Procedure: LAPAROSCOPIC CHOLECYSTECTOMY;  Surgeon: Erroll Luna, MD;  Location: [facility];  Service: General;  Laterality: N/A;  . CHOLECYSTECTOMY    . dental implant    . ERCP N/A 05/29/2019   Procedure: ENDOSCOPIC RETROGRADE CHOLANGIOPANCREATOGRAPHY (ERCP);  Surgeon: Clarene Essex, MD;  Location: [facility];  Service: Endoscopy;  Laterality: N/A;  . EYE SURGERY     Laser Eye Surgery approx 2007  . heart stent     failed-no stents  . HYSTEROSCOPY  9/10   D&C (polyp)  . LEFT HEART CATHETERIZATION WITH CORONARY ANGIOGRAM N/A 01/18/2011   Procedure: LEFT HEART CATHETERIZATION WITH CORONARY ANGIOGRAM;   Surgeon: Clent Demark, MD;  Location: [facility] CATH LAB;  Service: Cardiovascular;  Laterality: N/A;  . PERCUTANEOUS CORONARY STENT INTERVENTION (PCI-S) N/A 01/18/2011   Procedure: PERCUTANEOUS CORONARY STENT INTERVENTION (PCI-S);  Surgeon: Clent Demark, MD;  Location: [facility] C[facility] LAB;  Service: Cardiovascular;  Laterality: N/A;  . REMOVAL OF STONES  05/29/2019   Procedure: REMOVAL OF STONES;  Surgeon: Clarene Essex, MD;  [facility];  Service: Endoscopy;;  . [name]  05/29/2019   Procedure: [name];  Surgeon: Clarene Essex, MD;  Location: [facility];  Service: Endoscopy;;  . SPINAL FUSION    . TONSILLECTOMY AND ADENOIDECTOMY    . TUBAL LIGATION      MEDICATIONS: . albuterol (VENTOLIN HFA) 108 (90 Base) MCG/ACT inhaler  . Artificial Tear Solution (SOOTHE HYDRATION OP)  . aspirin EC 81 MG tablet  . atorvastatin (LIPITOR) 40 MG tablet  . budesonide-formoterol (SYMBICORT) 160-4.5 MCG/ACT inhaler  . hydrochlorothiazide (HYDRODIURIL) 25 MG tablet  . HYDROcodone-acetaminophen (NORCO/VICODIN) 5-325 MG tablet  . lisinopril (PRINIVIL,ZESTRIL) 40 MG tablet  . LORazepam (ATIVAN) 1 MG tablet  . metoprolol (LOPRESSOR) 50 MG tablet  . Multiple Vitamins-Minerals (PRESERVISION AREDS 2 PO)  . NITROSTAT 0.4 MG SL tablet  . Vitamin D, Ergocalciferol, (DRISDOL) 1.25 MG (50000 UNIT) CAPS capsule   No current facility-administered medications for this encounter.    Myra Gianotti, PA-C Surgical Short Stay/Anesthesiology [facility] Phone [phone] [facility] Phone [phone] 04/21/2020 12:27 PM

## 2020-04-21 NOTE — Anesthesia Preprocedure Evaluation (Addendum)
Anesthesia Evaluation  Patient identified by MRN, date of birth, ID band Patient awake    Reviewed: Allergy & Precautions, NPO status , Patient's Chart, lab work & pertinent test results  Airway Mallampati: II  TM Distance: >3 FB     Dental   Pulmonary COPD, former smoker,  Hx noted Dr. Nyoka Cowden   breath sounds clear to auscultation       Cardiovascular hypertension, + CAD and + Past MI   Rhythm:Regular Rate:Normal     Neuro/Psych Anxiety    GI/Hepatic negative GI ROS, Neg liver ROS,   Endo/Other  negative endocrine ROS  Renal/GU negative Renal ROS     Musculoskeletal  (+) Arthritis ,   Abdominal   Peds  Hematology   Anesthesia Other Findings   Reproductive/Obstetrics                            Anesthesia Physical Anesthesia Plan  ASA: III  Anesthesia Plan: General   Post-op Pain Management:    Induction: Intravenous  PONV Risk Score and Plan: 3 and Ondansetron, Dexamethasone and Midazolam  Airway Management Planned: Oral ETT  Additional Equipment:   Intra-op Plan:   Post-operative Plan: Extubation in OR  Informed Consent: I have reviewed the patients History and Physical, chart, labs and discussed the procedure including the risks, benefits and alternatives for the proposed anesthesia with the patient or authorized representative who has indicated his/her understanding and acceptance.     Dental advisory given  Plan Discussed with: CRNA and Anesthesiologist  Anesthesia Plan Comments: (PAT note written 04/21/2020 by Myra Gianotti, PA-C. )       Anesthesia Quick Evaluation

## 2020-04-22 ENCOUNTER — Other Ambulatory Visit (HOSPITAL_COMMUNITY)
Admission: RE | Admit: 2020-04-22 | Discharge: 2020-04-22 | Disposition: A | Discharge status: Home / Self Care | Payer: Medicare Other | Source: Ambulatory Visit | Attending: Neurological Surgery | Admitting: Neurological Surgery

## 2020-04-22 DIAGNOSIS — Z20822 Contact with and (suspected) exposure to covid-19: Secondary | ICD-10-CM | POA: Insufficient documentation

## 2020-04-22 DIAGNOSIS — Z01812 Encounter for preprocedural laboratory examination: Secondary | ICD-10-CM | POA: Insufficient documentation

## 2020-04-22 LAB — SARS CORONAVIRUS 2 (TAT 6-24 HRS): SARS Coronavirus 2: NEGATIVE

## 2020-04-25 ENCOUNTER — Inpatient Hospital Stay (HOSPITAL_COMMUNITY)
Admission: RE | Disposition: A | Discharge status: Home / Self Care | Payer: Self-pay | Source: Home / Self Care | Attending: Neurological Surgery

## 2020-04-25 ENCOUNTER — Inpatient Hospital Stay (HOSPITAL_COMMUNITY): Payer: Medicare Other | Admitting: Vascular Surgery

## 2020-04-25 ENCOUNTER — Encounter (HOSPITAL_COMMUNITY): Payer: Self-pay | Admitting: Neurological Surgery

## 2020-04-25 ENCOUNTER — Inpatient Hospital Stay (HOSPITAL_COMMUNITY): Payer: Medicare Other | Admitting: Certified Registered"

## 2020-04-25 ENCOUNTER — Other Ambulatory Visit: Payer: Self-pay

## 2020-04-25 ENCOUNTER — Inpatient Hospital Stay (HOSPITAL_COMMUNITY)
Admission: RE | Admit: 2020-04-25 | Discharge: 2020-04-27 | DRG: 454 | Disposition: A | Discharge status: Home / Self Care | Payer: Medicare Other | Attending: Neurological Surgery | Admitting: Neurological Surgery

## 2020-04-25 ENCOUNTER — Inpatient Hospital Stay (HOSPITAL_COMMUNITY): Payer: Medicare Other

## 2020-04-25 DIAGNOSIS — Z419 Encounter for procedure for purposes other than remedying health state, unspecified: Secondary | ICD-10-CM

## 2020-04-25 DIAGNOSIS — I2510 Atherosclerotic heart disease of native coronary artery without angina pectoris: Secondary | ICD-10-CM | POA: Diagnosis present

## 2020-04-25 DIAGNOSIS — Z7951 Long term (current) use of inhaled steroids: Secondary | ICD-10-CM | POA: Diagnosis not present

## 2020-04-25 DIAGNOSIS — F17220 Nicotine dependence, chewing tobacco, uncomplicated: Secondary | ICD-10-CM | POA: Diagnosis present

## 2020-04-25 DIAGNOSIS — E785 Hyperlipidemia, unspecified: Secondary | ICD-10-CM | POA: Diagnosis present

## 2020-04-25 DIAGNOSIS — M4807 Spinal stenosis, lumbosacral region: Secondary | ICD-10-CM | POA: Diagnosis present

## 2020-04-25 DIAGNOSIS — Z981 Arthrodesis status: Secondary | ICD-10-CM | POA: Diagnosis not present

## 2020-04-25 DIAGNOSIS — Z923 Personal history of irradiation: Secondary | ICD-10-CM

## 2020-04-25 DIAGNOSIS — M4317 Spondylolisthesis, lumbosacral region: Secondary | ICD-10-CM | POA: Diagnosis present

## 2020-04-25 DIAGNOSIS — Z803 Family history of malignant neoplasm of breast: Secondary | ICD-10-CM

## 2020-04-25 DIAGNOSIS — I10 Essential (primary) hypertension: Secondary | ICD-10-CM | POA: Diagnosis present

## 2020-04-25 DIAGNOSIS — Z7982 Long term (current) use of aspirin: Secondary | ICD-10-CM | POA: Diagnosis not present

## 2020-04-25 DIAGNOSIS — M48062 Spinal stenosis, lumbar region with neurogenic claudication: Secondary | ICD-10-CM | POA: Diagnosis present

## 2020-04-25 DIAGNOSIS — Z20822 Contact with and (suspected) exposure to covid-19: Secondary | ICD-10-CM | POA: Diagnosis present

## 2020-04-25 DIAGNOSIS — Z853 Personal history of malignant neoplasm of breast: Secondary | ICD-10-CM | POA: Diagnosis not present

## 2020-04-25 DIAGNOSIS — D62 Acute posthemorrhagic anemia: Secondary | ICD-10-CM | POA: Diagnosis not present

## 2020-04-25 DIAGNOSIS — Z79899 Other long term (current) drug therapy: Secondary | ICD-10-CM

## 2020-04-25 DIAGNOSIS — I252 Old myocardial infarction: Secondary | ICD-10-CM | POA: Diagnosis not present

## 2020-04-25 DIAGNOSIS — M4727 Other spondylosis with radiculopathy, lumbosacral region: Principal | ICD-10-CM | POA: Diagnosis present

## 2020-04-25 SURGERY — POSTERIOR LUMBAR FUSION 1 LEVEL
Anesthesia: General | Site: Back

## 2020-04-25 MED ORDER — POLYETHYLENE GLYCOL 3350 17 G PO PACK: 17.0000 g | PACK | Freq: Every day | ORAL | Status: DC | PRN

## 2020-04-25 MED ORDER — ALUM & MAG HYDROXIDE-SIMETH 200-200-20 MG/5ML PO SUSP
30.0000 mL | Freq: Four times a day (QID) | ORAL | Status: DC | PRN
Start: 2020-04-25 — End: 2020-04-27

## 2020-04-25 MED ORDER — PROPOFOL 10 MG/ML IV BOLUS
INTRAVENOUS | Status: DC | PRN
  Administered 2020-04-25: 150 mg via INTRAVENOUS

## 2020-04-25 MED ORDER — PHENOL 1.4 % MT LIQD: 1.0000 | OROMUCOSAL | Status: DC | PRN

## 2020-04-25 MED ORDER — NITROGLYCERIN 0.4 MG SL SUBL: 0.4000 mg | SUBLINGUAL_TABLET | SUBLINGUAL | Status: DC | PRN

## 2020-04-25 MED ORDER — FLEET ENEMA 7-19 GM/118ML RE ENEM: 1.0000 | ENEMA | Freq: Once | RECTAL | Status: DC | PRN

## 2020-04-25 MED ORDER — ONDANSETRON HCL 4 MG PO TABS: 4.0000 mg | ORAL_TABLET | Freq: Four times a day (QID) | ORAL | Status: DC | PRN

## 2020-04-25 MED ORDER — SUGAMMADEX SODIUM 200 MG/2ML IV SOLN
INTRAVENOUS | Status: DC | PRN
  Administered 2020-04-25: 200 mg via INTRAVENOUS

## 2020-04-25 MED ORDER — KETOROLAC TROMETHAMINE 15 MG/ML IJ SOLN
7.5000 mg | Freq: Four times a day (QID) | INTRAMUSCULAR | Status: AC
  Administered 2020-04-25 – 2020-04-26 (×3): 7.5 mg via INTRAVENOUS
  Filled 2020-04-25 (×2): qty 1

## 2020-04-25 MED ORDER — CHLORHEXIDINE GLUCONATE CLOTH 2 % EX PADS: 6.0000 | MEDICATED_PAD | Freq: Once | CUTANEOUS | Status: DC

## 2020-04-25 MED ORDER — MENTHOL 3 MG MT LOZG: 1.0000 | LOZENGE | OROMUCOSAL | Status: DC | PRN

## 2020-04-25 MED ORDER — DOCUSATE SODIUM 100 MG PO CAPS
100.0000 mg | ORAL_CAPSULE | Freq: Two times a day (BID) | ORAL | Status: DC
  Administered 2020-04-25 – 2020-04-27 (×4): 100 mg via ORAL
  Filled 2020-04-25 (×4): qty 1

## 2020-04-25 MED ORDER — KETOROLAC TROMETHAMINE 15 MG/ML IJ SOLN
INTRAMUSCULAR | Status: AC
  Filled 2020-04-25: qty 1

## 2020-04-25 MED ORDER — ONDANSETRON HCL 4 MG/2ML IJ SOLN
INTRAMUSCULAR | Status: DC | PRN
  Administered 2020-04-25: 4 mg via INTRAVENOUS

## 2020-04-25 MED ORDER — LORAZEPAM 0.5 MG PO TABS
1.0000 mg | ORAL_TABLET | Freq: Every day | ORAL | Status: DC
  Administered 2020-04-25 – 2020-04-26 (×2): 1 mg via ORAL
  Filled 2020-04-25 (×2): qty 2

## 2020-04-25 MED ORDER — LACTATED RINGERS IV SOLN: INTRAVENOUS | Status: DC

## 2020-04-25 MED ORDER — METHOCARBAMOL 500 MG PO TABS: 500.0000 mg | ORAL_TABLET | Freq: Four times a day (QID) | ORAL | Status: DC | PRN

## 2020-04-25 MED ORDER — ONDANSETRON HCL 4 MG/2ML IJ SOLN
INTRAMUSCULAR | Status: AC
  Filled 2020-04-25: qty 2

## 2020-04-25 MED ORDER — 0.9 % SODIUM CHLORIDE (POUR BTL) OPTIME
TOPICAL | Status: DC | PRN
  Administered 2020-04-25: 1000 mL

## 2020-04-25 MED ORDER — FENTANYL CITRATE (PF) 250 MCG/5ML IJ SOLN
INTRAMUSCULAR | Status: DC | PRN
  Administered 2020-04-25: 100 ug via INTRAVENOUS
  Administered 2020-04-25 (×3): 50 ug via INTRAVENOUS

## 2020-04-25 MED ORDER — SODIUM CHLORIDE 0.9 % IV SOLN: 250.0000 mL | INTRAVENOUS | Status: DC

## 2020-04-25 MED ORDER — CEFAZOLIN SODIUM-DEXTROSE 2-4 GM/100ML-% IV SOLN
2.0000 g | Freq: Three times a day (TID) | INTRAVENOUS | Status: AC
  Administered 2020-04-25 – 2020-04-26 (×2): 2 g via INTRAVENOUS
  Filled 2020-04-25 (×2): qty 100

## 2020-04-25 MED ORDER — VANCOMYCIN HCL IN DEXTROSE 1-5 GM/200ML-% IV SOLN
INTRAVENOUS | Status: AC
  Administered 2020-04-25: 1000 mg via INTRAVENOUS
  Filled 2020-04-25: qty 200

## 2020-04-25 MED ORDER — THROMBIN 5000 UNITS EX SOLR
CUTANEOUS | Status: AC
  Filled 2020-04-25: qty 5000

## 2020-04-25 MED ORDER — DEXTROSE 5 % IV SOLN
500.0000 mg | Freq: Four times a day (QID) | INTRAVENOUS | Status: DC | PRN
  Filled 2020-04-25: qty 5

## 2020-04-25 MED ORDER — VANCOMYCIN HCL IN DEXTROSE 1-5 GM/200ML-% IV SOLN: 1000.0000 mg | INTRAVENOUS | Status: AC

## 2020-04-25 MED ORDER — HYDROCHLOROTHIAZIDE 25 MG PO TABS: 25.0000 mg | ORAL_TABLET | Freq: Every day | ORAL | Status: DC

## 2020-04-25 MED ORDER — CHLORHEXIDINE GLUCONATE 0.12 % MT SOLN: 15.0000 mL | Freq: Once | OROMUCOSAL | Status: AC

## 2020-04-25 MED ORDER — POLYVINYL ALCOHOL 1.4 % OP SOLN
1.0000 [drp] | Freq: Every day | OPHTHALMIC | Status: DC | PRN
  Filled 2020-04-25: qty 15

## 2020-04-25 MED ORDER — SUCCINYLCHOLINE CHLORIDE 200 MG/10ML IV SOSY
PREFILLED_SYRINGE | INTRAVENOUS | Status: DC | PRN
  Administered 2020-04-25: 140 mg via INTRAVENOUS

## 2020-04-25 MED ORDER — FENTANYL CITRATE (PF) 100 MCG/2ML IJ SOLN
25.0000 ug | INTRAMUSCULAR | Status: DC | PRN
  Administered 2020-04-25: 50 ug via INTRAVENOUS
  Administered 2020-04-25 (×2): 25 ug via INTRAVENOUS

## 2020-04-25 MED ORDER — LIDOCAINE-EPINEPHRINE 1 %-1:100000 IJ SOLN
INTRAMUSCULAR | Status: AC
  Filled 2020-04-25: qty 1

## 2020-04-25 MED ORDER — ATORVASTATIN CALCIUM 40 MG PO TABS
40.0000 mg | ORAL_TABLET | Freq: Every day | ORAL | Status: DC
  Administered 2020-04-25 – 2020-04-26 (×2): 40 mg via ORAL
  Filled 2020-04-25 (×2): qty 1

## 2020-04-25 MED ORDER — BUPIVACAINE HCL (PF) 0.5 % IJ SOLN
INTRAMUSCULAR | Status: AC
  Filled 2020-04-25: qty 30

## 2020-04-25 MED ORDER — BISACODYL 10 MG RE SUPP: 10.0000 mg | Freq: Every day | RECTAL | Status: DC | PRN

## 2020-04-25 MED ORDER — ALBUMIN HUMAN 5 % IV SOLN: INTRAVENOUS | Status: DC | PRN

## 2020-04-25 MED ORDER — ROCURONIUM BROMIDE 10 MG/ML (PF) SYRINGE
PREFILLED_SYRINGE | INTRAVENOUS | Status: DC | PRN
  Administered 2020-04-25 (×2): 30 mg via INTRAVENOUS
  Administered 2020-04-25: 50 mg via INTRAVENOUS
  Administered 2020-04-25: 20 mg via INTRAVENOUS

## 2020-04-25 MED ORDER — METOPROLOL TARTRATE 25 MG PO TABS
50.0000 mg | ORAL_TABLET | Freq: Two times a day (BID) | ORAL | Status: DC
  Administered 2020-04-25 – 2020-04-26 (×2): 50 mg via ORAL
  Filled 2020-04-25 (×2): qty 2

## 2020-04-25 MED ORDER — MOMETASONE FURO-FORMOTEROL FUM 200-5 MCG/ACT IN AERO
2.0000 | INHALATION_SPRAY | Freq: Two times a day (BID) | RESPIRATORY_TRACT | Status: DC
  Filled 2020-04-25: qty 8.8

## 2020-04-25 MED ORDER — SOOTHE HYDRATION 1.25 % OP SOLN: Freq: Every day | OPHTHALMIC | Status: DC | PRN

## 2020-04-25 MED ORDER — PHENYLEPHRINE HCL-NACL 10-0.9 MG/250ML-% IV SOLN
INTRAVENOUS | Status: DC | PRN
  Administered 2020-04-25: 30 ug/min via INTRAVENOUS

## 2020-04-25 MED ORDER — SODIUM CHLORIDE 0.9% FLUSH: 3.0000 mL | Freq: Two times a day (BID) | INTRAVENOUS | Status: DC

## 2020-04-25 MED ORDER — THROMBIN 5000 UNITS EX SOLR
OROMUCOSAL | Status: DC | PRN
  Administered 2020-04-25 (×2): 5 mL via TOPICAL

## 2020-04-25 MED ORDER — FENTANYL CITRATE (PF) 250 MCG/5ML IJ SOLN
INTRAMUSCULAR | Status: AC
  Filled 2020-04-25: qty 5

## 2020-04-25 MED ORDER — ORAL CARE MOUTH RINSE: 15.0000 mL | Freq: Once | OROMUCOSAL | Status: AC

## 2020-04-25 MED ORDER — LIDOCAINE 2% (20 MG/ML) 5 ML SYRINGE
INTRAMUSCULAR | Status: DC | PRN
  Administered 2020-04-25: 60 mg via INTRAVENOUS

## 2020-04-25 MED ORDER — SODIUM CHLORIDE 0.9% FLUSH: 3.0000 mL | INTRAVENOUS | Status: DC | PRN

## 2020-04-25 MED ORDER — FENTANYL CITRATE (PF) 100 MCG/2ML IJ SOLN
INTRAMUSCULAR | Status: AC
  Filled 2020-04-25: qty 2

## 2020-04-25 MED ORDER — ALBUTEROL SULFATE HFA 108 (90 BASE) MCG/ACT IN AERS
1.0000 | INHALATION_SPRAY | Freq: Four times a day (QID) | RESPIRATORY_TRACT | Status: DC | PRN
  Filled 2020-04-25: qty 6.7

## 2020-04-25 MED ORDER — DEXAMETHASONE SODIUM PHOSPHATE 10 MG/ML IJ SOLN
INTRAMUSCULAR | Status: DC | PRN
  Administered 2020-04-25: 10 mg via INTRAVENOUS

## 2020-04-25 MED ORDER — ONDANSETRON HCL 4 MG/2ML IJ SOLN: 4.0000 mg | Freq: Four times a day (QID) | INTRAMUSCULAR | Status: DC | PRN

## 2020-04-25 MED ORDER — LIDOCAINE-EPINEPHRINE 1 %-1:100000 IJ SOLN
INTRAMUSCULAR | Status: DC | PRN
  Administered 2020-04-25: 5 mL

## 2020-04-25 MED ORDER — OXYCODONE-ACETAMINOPHEN 5-325 MG PO TABS
1.0000 | ORAL_TABLET | ORAL | Status: DC | PRN
  Administered 2020-04-25: 1 via ORAL
  Administered 2020-04-26 – 2020-04-27 (×6): 2 via ORAL
  Administered 2020-04-27: 1 via ORAL
  Filled 2020-04-25 (×5): qty 2
  Filled 2020-04-25: qty 1
  Filled 2020-04-25 (×2): qty 2

## 2020-04-25 MED ORDER — CHLORHEXIDINE GLUCONATE 0.12 % MT SOLN
OROMUCOSAL | Status: AC
  Administered 2020-04-25: 15 mL via OROMUCOSAL
  Filled 2020-04-25: qty 15

## 2020-04-25 MED ORDER — LISINOPRIL 20 MG PO TABS: 40.0000 mg | ORAL_TABLET | Freq: Every day | ORAL | Status: DC

## 2020-04-25 MED ORDER — BUPIVACAINE HCL (PF) 0.5 % IJ SOLN
INTRAMUSCULAR | Status: DC | PRN
  Administered 2020-04-25: 5 mL

## 2020-04-25 MED ORDER — PHENYLEPHRINE 40 MCG/ML (10ML) SYRINGE FOR IV PUSH (FOR BLOOD PRESSURE SUPPORT)
PREFILLED_SYRINGE | INTRAVENOUS | Status: DC | PRN
  Administered 2020-04-25: 80 ug via INTRAVENOUS
  Administered 2020-04-25: 120 ug via INTRAVENOUS

## 2020-04-25 MED ORDER — SENNA 8.6 MG PO TABS
1.0000 | ORAL_TABLET | Freq: Two times a day (BID) | ORAL | Status: DC
  Administered 2020-04-25 – 2020-04-26 (×3): 8.6 mg via ORAL
  Filled 2020-04-25 (×3): qty 1

## 2020-04-25 MED ORDER — MORPHINE SULFATE (PF) 2 MG/ML IV SOLN: 2.0000 mg | INTRAVENOUS | Status: DC | PRN

## 2020-04-25 MED ORDER — ONDANSETRON HCL 4 MG/2ML IJ SOLN
4.0000 mg | Freq: Once | INTRAMUSCULAR | Status: AC
  Administered 2020-04-25: 4 mg via INTRAVENOUS

## 2020-04-25 MED ORDER — ACETAMINOPHEN 325 MG PO TABS: 650.0000 mg | ORAL_TABLET | ORAL | Status: DC | PRN

## 2020-04-25 MED ORDER — CYCLOBENZAPRINE HCL 5 MG PO TABS
5.0000 mg | ORAL_TABLET | Freq: Three times a day (TID) | ORAL | Status: DC | PRN
  Administered 2020-04-25 – 2020-04-26 (×2): 5 mg via ORAL
  Filled 2020-04-25 (×2): qty 1

## 2020-04-25 MED ORDER — ACETAMINOPHEN 650 MG RE SUPP: 650.0000 mg | RECTAL | Status: DC | PRN

## 2020-04-25 SURGICAL SUPPLY — 72 items
ADH SKN CLS APL DERMABOND .7 (GAUZE/BANDAGES/DRESSINGS) ×1
APL SRG 60D 8 XTD TIP BNDBL (TIP)
BASKET BONE COLLECTION (BASKET) ×2 IMPLANT
BLADE CLIPPER SURG (BLADE) IMPLANT
BUR MATCHSTICK NEURO 3.0 LAGG (BURR) ×2 IMPLANT
CAGE PLIF 8X9X23-12 LUMBAR (Cage) ×2 IMPLANT
CANISTER SUCT 3000ML PPV (MISCELLANEOUS) ×2 IMPLANT
CNTNR URN SCR LID CUP LEK RST (MISCELLANEOUS) ×1 IMPLANT
CONNECTOR RELINE 20MM OPEN OFF (Connector) ×2 IMPLANT
CONNECTOR RELINE 30MM OPEN OFF (Connector) ×2 IMPLANT
CONT SPEC 4OZ STRL OR WHT (MISCELLANEOUS) ×2
COVER BACK TABLE 60X90IN (DRAPES) ×2 IMPLANT
COVER WAND RF STERILE (DRAPES) ×2 IMPLANT
DECANTER SPIKE VIAL GLASS SM (MISCELLANEOUS) ×2 IMPLANT
DERMABOND ADVANCED (GAUZE/BANDAGES/DRESSINGS) ×1
DERMABOND ADVANCED .7 DNX12 (GAUZE/BANDAGES/DRESSINGS) ×1 IMPLANT
DEVICE DISSECT PLASMABLAD 3.0S (MISCELLANEOUS) ×1 IMPLANT
DRAPE C-ARM 42X72 X-RAY (DRAPES) ×4 IMPLANT
DRAPE C-ARMOR (DRAPES) ×1 IMPLANT
DRAPE HALF SHEET 40X57 (DRAPES) IMPLANT
DRAPE LAPAROTOMY 100X72X124 (DRAPES) ×2 IMPLANT
DRSG OPSITE POSTOP 4X8 (GAUZE/BANDAGES/DRESSINGS) ×1 IMPLANT
DURAPREP 26ML APPLICATOR (WOUND CARE) ×2 IMPLANT
DURASEAL APPLICATOR TIP (TIP) IMPLANT
DURASEAL SPINE SEALANT 3ML (MISCELLANEOUS) IMPLANT
ELECT REM PT RETURN 9FT ADLT (ELECTROSURGICAL) ×2
ELECTRODE REM PT RTRN 9FT ADLT (ELECTROSURGICAL) ×1 IMPLANT
GAUZE 4X4 16PLY RFD (DISPOSABLE) IMPLANT
GAUZE SPONGE 4X4 12PLY STRL (GAUZE/BANDAGES/DRESSINGS) ×2 IMPLANT
GAUZE SPONGE 4X4 16PLY XRAY LF (GAUZE/BANDAGES/DRESSINGS) ×1 IMPLANT
GLOVE BIOGEL PI IND STRL 8.5 (GLOVE) ×2 IMPLANT
GLOVE BIOGEL PI INDICATOR 8.5 (GLOVE) ×2
GLOVE ECLIPSE 8.5 STRL (GLOVE) ×4 IMPLANT
GLOVE ECLIPSE 9.0 STRL (GLOVE) ×1 IMPLANT
GLOVE SURG POLYISO LF SZ7 (GLOVE) ×2 IMPLANT
GLOVE SURG UNDER POLY LF SZ7 (GLOVE) ×2 IMPLANT
GOWN STRL REUS W/ TWL LRG LVL3 (GOWN DISPOSABLE) IMPLANT
GOWN STRL REUS W/ TWL XL LVL3 (GOWN DISPOSABLE) IMPLANT
GOWN STRL REUS W/TWL 2XL LVL3 (GOWN DISPOSABLE) ×4 IMPLANT
GOWN STRL REUS W/TWL LRG LVL3 (GOWN DISPOSABLE)
GOWN STRL REUS W/TWL XL LVL3 (GOWN DISPOSABLE) ×2
GRAFT DURAGEN MATRIX 1WX1L (Tissue) ×1 IMPLANT
HEMOSTAT POWDER KIT SURGIFOAM (HEMOSTASIS) ×2 IMPLANT
KIT BASIN OR (CUSTOM PROCEDURE TRAY) ×2 IMPLANT
KIT INFUSE SMALL (Orthopedic Implant) ×1 IMPLANT
KIT TURNOVER KIT B (KITS) ×2 IMPLANT
MILL MEDIUM DISP (BLADE) ×2 IMPLANT
NEEDLE HYPO 22GX1.5 SAFETY (NEEDLE) ×2 IMPLANT
NS IRRIG 1000ML POUR BTL (IV SOLUTION) ×2 IMPLANT
PACK LAMINECTOMY NEURO (CUSTOM PROCEDURE TRAY) ×2 IMPLANT
PAD ARMBOARD 7.5X6 YLW CONV (MISCELLANEOUS) ×3 IMPLANT
PATTIES SURGICAL .5 X1 (DISPOSABLE) ×2 IMPLANT
PLASMABLADE 3.0S (MISCELLANEOUS) ×2
ROD RELINE 0-0 CON M 5.0/6.0MM (Rod) ×2 IMPLANT
ROD RELINE-O 5.5X100 LORD (Rod) ×2 IMPLANT
SCREW LOCK RELINE 5.5 TULIP (Screw) ×12 IMPLANT
SCREW RELINE 7.5X 80MM 2S POLY (Screw) ×2 IMPLANT
SCREW RELINE-O POLY 6.5X45 (Screw) ×2 IMPLANT
SPONGE LAP 4X18 RFD (DISPOSABLE) IMPLANT
SPONGE SURGIFOAM ABS GEL 100 (HEMOSTASIS) ×1 IMPLANT
SUT PROLENE 6 0 BV (SUTURE) ×1 IMPLANT
SUT VIC AB 1 CT1 18XBRD ANBCTR (SUTURE) ×1 IMPLANT
SUT VIC AB 1 CT1 8-18 (SUTURE) ×2
SUT VIC AB 2-0 CP2 18 (SUTURE) ×2 IMPLANT
SUT VIC AB 3-0 SH 8-18 (SUTURE) ×2 IMPLANT
SUT VIC AB 4-0 RB1 18 (SUTURE) ×2 IMPLANT
SYR 3ML LL SCALE MARK (SYRINGE) ×10 IMPLANT
TOWEL GREEN STERILE (TOWEL DISPOSABLE) ×2 IMPLANT
TOWEL GREEN STERILE FF (TOWEL DISPOSABLE) ×2 IMPLANT
TRAY FOLEY MTR SLVR 14FR STAT (SET/KITS/TRAYS/PACK) ×1 IMPLANT
TRAY FOLEY MTR SLVR 16FR STAT (SET/KITS/TRAYS/PACK) ×1 IMPLANT
WATER STERILE IRR 1000ML POUR (IV SOLUTION) ×2 IMPLANT

## 2020-04-25 NOTE — Progress Notes (Signed)
Orthopedic Tech Progress Note Patient Details:  Rachel Chase [birth year] [id number] Called in order to [facility] for an [place] Patient ID: Rachel Chase, female   DOB: [birth year], 80 y.o.   MRN: [id number]   Janit Pagan 04/25/2020, 4:08 PM

## 2020-04-25 NOTE — Anesthesia Postprocedure Evaluation (Signed)
Anesthesia Post Note  Patient: Rachel Chase  Procedure(s) Performed: Lumbar five Sacral one Posterior lumbar interbody fusion with fixation to the pelvis (N/A Back)     Patient location during evaluation: PACU Anesthesia Type: General Level of consciousness: awake Pain management: pain level controlled Vital Signs Assessment: post-procedure vital signs reviewed and stable Respiratory status: spontaneous breathing Cardiovascular status: stable Postop Assessment: no apparent nausea or vomiting Anesthetic complications: no   No complications documented.  Last Vitals:  Vitals:   04/25/20 0853 04/25/20 1545  BP: (!) 151/81   Pulse: 79   Resp: 18   Temp: 36.4 C 36.5 C  SpO2: 96%     Last Pain:  Vitals:   04/25/20 1545  TempSrc:   PainSc: 0-No pain                 Leveta Wahab

## 2020-04-25 NOTE — Anesthesia Procedure Notes (Signed)
Procedure Name: Intubation Date/Time: 04/25/2020 11:39 AM Performed by: Griffin Dakin, CRNA Pre-anesthesia Checklist: Patient identified, Emergency Drugs available, Suction available and Patient being monitored Patient Re-evaluated:Patient Re-evaluated prior to induction Oxygen Delivery Method: Circle system utilized Preoxygenation: Pre-oxygenation with 100% oxygen Induction Type: IV induction Laryngoscope Size: Glidescope Grade View: Grade I Tube type: Oral Tube size: 7.0 mm Number of attempts: 1 Airway Equipment and Method: Stylet and Oral airway Placement Confirmation: ETT inserted through vocal cords under direct vision,  positive ETCO2 and breath sounds checked- equal and bilateral Secured at: 22 cm Tube secured with: Tape Dental Injury: Teeth and Oropharynx as per pre-operative assessment

## 2020-04-25 NOTE — H&P (Signed)
Rachel Chase is an 80 y.o. female.   Chief Complaint: Back and bilateral lower extremity pain right worse than left.   HPI: Patient is a 80 year old right-handed individual who has had significant lumbar surgical decompression and stabilization at L3-4 and L4-5 secondary to spondylolisthesis with severe stenosis.  She has done well for a number of years but here recently has had increasing buttock and right leg pain.  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  She is now admitted for this procedure.  Past Medical History:  Diagnosis Date  . Anxiety   . Arthritis   . Back pain    Lower Back pain - takes Cortisone shots  . Bone spur 2009   lower back  . Breast cancer ([place]) 2015   finished radiation 12/15  . Bronchitis   . CAD (coronary artery disease)    a. cath 12/2010 for cardiac arrest showed   . Cataract   . Complication of anesthesia    throat irrition from past intubations, intubation granulomas  . Coronary artery disease   . Dyspnea on exertion   . History of cardiac arrest 12/2010  . Hyperlipemia   . Hypertension   . MI (myocardial infarction) ([place]) 12/2010   Was followed by Dr. Pernell Dupre  . Personal history of radiation therapy   . 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 Radiation 01/06/14-02/04/14   left breast   . Rosacea 2008   both eyes    Past Surgical History:  Procedure Laterality Date  . ANTERIOR LAT LUMBAR FUSION N/A 11/18/2018   Procedure: Lumbar One-Two Anterolateral lumbar decompression/fusion/lateral plate fixation;  Surgeon: Kristeen Miss, MD;  Location: [place];  Service: Neurosurgery;  Laterality: N/A;  Lumbar One-Two Anterolateral lumbar decompression/fusion/lateral plate fixation  . BACK SURGERY    . BILIARY DILATION  05/29/2019    Procedure: BILIARY DILATION;  Surgeon: Clarene Essex, MD;  [facility];  Service: Endoscopy;;  . BREAST BIOPSY  8/10   fibrocystic changes  . BREAST LUMPECTOMY Left 2015  . BREAST LUMPECTOMY WITH NEEDLE LOCALIZATION AND AXILLARY SENTINEL LYMPH NODE BX Left 11/13/2013   Procedure: NEEDLE LOCALIZATION LEFT BREAST LUMPECTOMY WITH LEFT  AXILLARY SENTINEL LYMPH NODE BX;  Surgeon: Excell Seltzer, MD;  Location: [place];  Service: General;  Laterality: Left;  . CATARACT EXTRACTION     both  . CHOLECYSTECTOMY N/A 05/30/2019   Procedure: LAPAROSCOPIC CHOLECYSTECTOMY;  Surgeon: Erroll Luna, MD;  [facility];  Service: General;  Laterality: N/A;  . CHOLECYSTECTOMY    . dental implant    . ERCP N/A 05/29/2019   Procedure: ENDOSCOPIC RETROGRADE CHOLANGIOPANCREATOGRAPHY (ERCP);  Surgeon: Clarene Essex, MD;  Location: [facility];  Service: Endoscopy;  Laterality: N/A;  . EYE SURGERY     Laser Eye Surgery approx 2007  . heart stent     failed-no stents  . HYSTEROSCOPY  9/10   D&C (polyp)  . LEFT HEART CATHETERIZATION WITH CORONARY ANGIOGRAM N/A 01/18/2011   Procedure: LEFT HEART CATHETERIZATION WITH CORONARY ANGIOGRAM;  Surgeon: Clent Demark, MD;  Location[facility] CATH LAB;  Service: Cardiovascular;  Laterality: N/A;  . PERCUTANEOUS CORONARY STENT INTERVENTION (PCI-S) N/A 01/18/2011   Procedure: PERCUTANEOUS CORONARY STENT INTERVENTION (PCI-S);  Surgeon: Clent Demark, MD;  Location: [facility] CATH LAB;  Service: Cardiovascular;  Laterality: N/A;  . REMOVAL OF STONES  05/29/2019   Procedure: REMOVAL OF STONES;  Surgeon: Clarene Essex, MD;  [facility];  Service: Endoscopy;;  . Rachel Chase  05/29/2019   Procedure: Rachel Chase;  Surgeon: Clarene Essex, MD;  [facility];  Service: Endoscopy;;  . SPINAL FUSION    . TONSILLECTOMY AND ADENOIDECTOMY    . TUBAL LIGATION      Family History  Problem Relation Age of Onset  . Congestive Heart Failure Father    . CVA Father        MI, CAD  . Coronary artery disease Father   . Valvular heart disease Mother   . Hypertension Mother   . Rectal cancer Sister   . Breast cancer Sister   . Breast cancer Maternal Grandmother 90  . Breast cancer Maternal Aunt   . Cancer Sister    Social History:  reports that she quit smoking about 22 years ago. Her smoking use included cigarettes. She has a 67.50 pack-year smoking history. Her smokeless tobacco use includes chew. She reports current alcohol use. She reports that she does not use drugs.  Allergies:  Allergies  Allergen Reactions  . Dilaudid [Hydromorphone Hcl] Nausea And Vomiting  . Penicillins Rash    Medications Prior to Admission  Medication Sig Dispense Refill  . Artificial Tear Solution (SOOTHE HYDRATION OP) Place 1 drop into both eyes daily as needed (dry eyes).    Marland Kitchen aspirin EC 81 MG tablet Take 81 mg by mouth daily with supper.    Marland Kitchen atorvastatin (LIPITOR) 40 MG tablet Take 40 mg by mouth at bedtime.     . budesonide-formoterol (SYMBICORT) 160-4.5 MCG/ACT inhaler Inhale 2 puffs into the lungs daily.    . hydrochlorothiazide (HYDRODIURIL) 25 MG tablet Take 25 mg by mouth daily.     Marland Kitchen HYDROcodone-acetaminophen (NORCO/VICODIN) 5-325 MG tablet Take 0.5 tablets by mouth in the morning, at noon, in the evening, and at bedtime.    Marland Kitchen lisinopril (PRINIVIL,ZESTRIL) 40 MG tablet Take 40 mg by mouth daily.     Marland Kitchen LORazepam (ATIVAN) 1 MG tablet Take 1 mg by mouth at bedtime.    . metoprolol (LOPRESSOR) 50 MG tablet Take 50 mg by mouth 2 (two) times daily.    . Multiple Vitamins-Minerals (PRESERVISION AREDS 2 PO) Take 1 tablet by mouth in the morning and at bedtime.    . Vitamin D, Ergocalciferol, (DRISDOL) 1.25 MG (50000 UNIT) CAPS capsule Take 1 capsule (50,000 Units total) by mouth every 7 (seven) days. TAKE ONE CAPSULE (50,000 UNITS) BY MOUTH EVERY 14 DAYS (Patient taking differently: Take 50,000 Units by mouth every 14 (fourteen) days.) 12 capsule 4  .  albuterol (VENTOLIN HFA) 108 (90 Base) MCG/ACT inhaler Inhale 1-2 puffs into the lungs every 6 (six) hours as needed for wheezing or shortness of breath.    Marland Kitchen NITROSTAT 0.4 MG SL tablet DISSOLVE ONE TABLET UNDER THE TONGUE AS NEEDED FOR CHEST PAIN AS DIRECTED (Patient taking differently: Place 0.4 mg under the tongue every 5 (five) minutes x 3 doses as needed for chest pain.) 25 tablet 2    No results found for this or any previous visit (from the past 48 hour(s)). No results found.  Review of Systems  Constitutional: Positive for activity change.  HENT: Negative.   Eyes: Negative.   Respiratory: Negative.   Cardiovascular: Negative.   Gastrointestinal: Negative.   Endocrine: Negative.   Genitourinary: Negative.   Musculoskeletal: Positive for back pain, gait problem and myalgias.  Allergic/Immunologic: Negative.   Neurological: Positive for weakness and numbness.  Hematological: Negative.   Psychiatric/Behavioral: Negative.     Blood pressure (!) 151/81, pulse 79, temperature 97.6 F (36.4 C), temperature source Oral, resp. rate 18, height 5\' 8"  (1.727 m), weight 78.5 kg, last menstrual period 02/27/2008, SpO2 96 %. Physical Exam Constitutional:      Appearance: Normal appearance.  HENT:     Head: Normocephalic and atraumatic.     Right Ear: Tympanic membrane normal.     Left Ear: Tympanic membrane normal.     Nose: Nose normal.     Mouth/Throat:     Mouth: Mucous membranes are moist.  Eyes:     Extraocular Movements: Extraocular movements intact.     Pupils: Pupils are equal, round, and reactive to light.  Cardiovascular:     Rate and Rhythm: Normal rate and regular rhythm.     Pulses: Normal pulses.     Heart sounds: Normal heart sounds.  Pulmonary:     Effort: Pulmonary effort is normal.     Breath sounds: Normal breath sounds.  Abdominal:     General: Abdomen is flat.     Palpations: Abdomen is soft.  Musculoskeletal:        General: Normal range of motion.      Cervical back: Normal range of motion and neck supple.  Skin:    General: Skin is warm and dry.     Capillary Refill: Capillary refill takes less than 2 seconds.  Neurological:     General: No focal deficit present.     Mental Status: She is alert and oriented to person, place, and time.     Comments: Mild weakness in tibialis anterior right greater than left gluteal strength appears intact deep tendon reflexes are absent in the patella and the Achilles in the lower extremities Station and gait is intact.  Upper extremity strength and reflexes are normal  Cranial nerve examination is normal cerebellar examination is normal  Psychiatric:        Mood and Affect: Mood normal.        Behavior: Behavior normal.        Thought Content: Thought content normal.        Judgment: Judgment normal.      Assessment/Plan Spondylolisthesis L5-S1 with lumbar stenosis and right greater than left lateral recess stenosis.  History of fusion L3-L5.  Right lumbar radiculopathy.  Plan: Decompression and fusion L5-S1 with fixation to the pelvis from L3 to the pelvis.  Earleen Newport, MD 04/25/2020, 11:05 AM

## 2020-04-25 NOTE — Transfer of Care (Signed)
Immediate Anesthesia Transfer of Care Note  Patient: Rachel Chase  Procedure(s) Performed: Lumbar five Sacral one Posterior lumbar interbody fusion with fixation to the pelvis (N/A Back)  Patient Location: PACU  Anesthesia Type:General  Level of Consciousness: drowsy and patient cooperative  Airway & Oxygen Therapy: Patient Spontanous Breathing  Post-op Assessment: Report given to RN and Post -op Vital signs reviewed and stable  Post vital signs: Reviewed and stable  Last Vitals:  Vitals Value Taken Time  BP 140/50 04/25/20 1544  Temp    Pulse 85 04/25/20 1545  Resp 16 04/25/20 1545  SpO2 95 % 04/25/20 1545  Vitals shown include unvalidated device data.  Last Pain:  Vitals:   04/25/20 0921  TempSrc:   PainSc: 0-No pain         Complications: No complications documented.

## 2020-04-26 LAB — CBC
HCT: 25.6 % — ABNORMAL LOW (ref 36.0–46.0)
Hemoglobin: 8.6 g/dL — ABNORMAL LOW (ref 12.0–15.0)
MCH: 30 pg (ref 26.0–34.0)
MCHC: 33.6 g/dL (ref 30.0–36.0)
MCV: 89.2 fL (ref 80.0–100.0)
Platelets: 197 10*3/uL (ref 150–400)
RBC: 2.87 MIL/uL — ABNORMAL LOW (ref 3.87–5.11)
RDW: 13.6 % (ref 11.5–15.5)
WBC: 12.1 10*3/uL — ABNORMAL HIGH (ref 4.0–10.5)
nRBC: 0 % (ref 0.0–0.2)

## 2020-04-26 LAB — BASIC METABOLIC PANEL
Anion gap: 9 (ref 5–15)
BUN: 24 mg/dL — ABNORMAL HIGH (ref 8–23)
CO2: 26 mmol/L (ref 22–32)
Calcium: 8.3 mg/dL — ABNORMAL LOW (ref 8.9–10.3)
Chloride: 103 mmol/L (ref 98–111)
Creatinine, Ser: 1 mg/dL (ref 0.44–1.00)
GFR, Estimated: 57 mL/min — ABNORMAL LOW (ref >60)
Glucose, Bld: 130 mg/dL — ABNORMAL HIGH (ref 70–99)
Potassium: 4.5 mmol/L (ref 3.5–5.1)
Sodium: 138 mmol/L (ref 135–145)

## 2020-04-26 LAB — PREPARE RBC (CROSSMATCH)

## 2020-04-26 MED ORDER — SODIUM CHLORIDE 0.9% IV SOLUTION: Freq: Once | INTRAVENOUS | Status: AC

## 2020-04-26 NOTE — Evaluation (Signed)
Physical Therapy Evaluation Patient Details Name: Rachel Chase MRN: [id number] DOB: [birth year] Today's Date: 04/26/2020   History of Present Illness  80 y.o. female presenting with L5-S1 degenerative spondylolisthesis and lumbar radiculopathy s/p  decompression and fusion. PMHx significant for L3-4 and L4-5 ALIF 2020, breast cancer s/p lumpectomy and radiation ~2015, CAD, HLD, HTN, & MI.  Clinical Impression  Pt admitted with above diagnosis. At the time of PT eval, pt was able to demonstrate transfers and ambulation with gross supervision for safety to modified independence and no AD. Pt with DOE 3/4 by end of gait training which pt reports is worse than her normal. O2 sats 99% by end of gait training. Pt was educated on precautions, brace application/wearing schedule, appropriate activity progression, and car transfer. Pt currently with functional limitations due to the deficits listed below (see PT Problem List). Pt will benefit from skilled PT to increase their independence and safety with mobility to allow discharge to the venue listed below.      Follow Up Recommendations No PT follow up;Supervision for mobility/OOB    Equipment Recommendations  None recommended by PT    Recommendations for Other Services       Precautions / Restrictions Precautions Precautions: Back Precaution Booklet Issued: Yes (comment) Precaution Comments: Reviewed written handout. Patient able to recall 3/3 back precautions. Required Braces or Orthoses: Spinal Brace Spinal Brace: Lumbar corset Restrictions Weight Bearing Restrictions: No      Mobility  Bed Mobility Overal bed mobility: Modified Independent             General bed mobility comments: VC's for optimal log roll. HOB flat and rails lowered to simulate home environment.    Transfers Overall transfer level: Modified independent Equipment used: None             General transfer comment: Pt demonstrated proper hand placement  on seated surface for safety. VC's for improved posture and hand placement on seated surface for safety.  Ambulation/Gait Ambulation/Gait assistance: Supervision Gait Distance (Feet): 200 Feet Assistive device: None Gait Pattern/deviations: Step-through pattern;Decreased stride length;Trunk flexed Gait velocity: Decreased Gait velocity interpretation: 1.31 - 2.62 ft/sec, indicative of limited community ambulator General Gait Details: VC's for improved posture and energy conservation. Pt SOB throughout ambulation and required a standing rest break to catch her breath before proceeding. 3/4 DOE by end of gait training but O2 sats 99% on RA.  Stairs            Wheelchair Mobility    Modified Rankin (Stroke Patients Only)       Balance Overall balance assessment: Mild deficits observed, not formally tested                                           Pertinent Vitals/Pain Pain Assessment: Faces Faces Pain Scale: Hurts a little bit Pain Location: Low back and R leg Pain Descriptors / Indicators: Aching;Sore Pain Intervention(s): Limited activity within patient's tolerance;Monitored during session;Repositioned    Home Living Family/patient expects to be discharged to:: Other (Comment) ([facility] (independent living apartment)) Living Arrangements: Alone Available Help at Discharge: Friend(s);Available PRN/intermittently Type of Home: Independent living facility Home Access: Level entry     Home Layout: One level Home Equipment: Walker - 2 wheels;Shower seat - built in;Grab bars - toilet;Grab bars - tub/shower;Hand held shower head;Adaptive equipment      Prior Function Level of Independence:  Independent         Comments: Independent with ADLs/IADLs including light meal prep and driving (daytime only). Gets most meals from dining hall.     Hand Dominance   Dominant Hand: Right    Extremity/Trunk Assessment   Upper Extremity Assessment Upper  Extremity Assessment: Defer to OT evaluation    Lower Extremity Assessment Lower Extremity Assessment: Generalized weakness    Cervical / Trunk Assessment Cervical / Trunk Assessment: Other exceptions Cervical / Trunk Exceptions: s/p spinal surgery.  Communication   Communication: No difficulties  Cognition Arousal/Alertness: Awake/alert Behavior During Therapy: WFL for tasks assessed/performed Overall Cognitive Status: Within Functional Limits for tasks assessed                                        General Comments      Exercises     Assessment/Plan    PT Assessment Patient needs continued PT services  PT Problem List Decreased strength;Decreased activity tolerance;Decreased balance;Decreased mobility;Decreased knowledge of use of DME;Decreased safety awareness;Decreased knowledge of precautions;Pain       PT Treatment Interventions DME instruction;Gait training;Stair training;Functional mobility training;Therapeutic activities;Therapeutic exercise;Neuromuscular re-education;Patient/family education    PT Goals (Current goals can be found in the Care Plan section)  Acute Rehab PT Goals Patient Stated Goal: To return to ILF apartment independently. PT Goal Formulation: With patient Time For Goal Achievement: 05/03/20 Potential to Achieve Goals: Good    Frequency Min 5X/week   Barriers to discharge        Co-evaluation               AM-PAC PT "6 Clicks" Mobility  Outcome Measure Help needed turning from your back to your side while in a flat bed without using bedrails?: None Help needed moving from lying on your back to sitting on the side of a flat bed without using bedrails?: None Help needed moving to and from a bed to a chair (including a wheelchair)?: None Help needed standing up from a chair using your arms (e.g., wheelchair or bedside chair)?: None Help needed to walk in hospital room?: A Little Help needed climbing 3-5 steps with a  railing? : A Little 6 Click Score: 22    End of Session Equipment Utilized During Treatment: Gait belt;Back brace Activity Tolerance: Patient tolerated treatment well Patient left: in bed;with call bell/phone within reach Nurse Communication: Mobility status PT Visit Diagnosis: Unsteadiness on feet (R26.81);Pain Pain - part of body:  (back; R LE)    Time: 5427-0623 PT Time Calculation (min) (ACUTE ONLY): 14 min   Charges:   PT Evaluation $PT Eval Low Complexity: 1 Low          Rachel Chase, PT, DPT Acute Rehabilitation Services Pager: [phone] Office: [phone]   Rachel Chase 04/26/2020, 12:13 PM

## 2020-04-26 NOTE — Op Note (Signed)
Date of surgery: 04/25/2020 Preoperative diagnosis: Spondylosis and stenosis L5-S1, history of fusion L to to L5.  Lumbar radiculopathy right greater than left. Postoperative diagnosis: Same Procedure: Bilateral laminotomies and decompression of L5 and S1 nerve roots.  Posterior lumbar interbody arthrodesis with peek spacers local autograft and allograft.  Infuse.  Pedicle fixation of L2-L5 fixation to the sacrum and the pelvis.  Fluoroscopic guidance. Surgeon: Kristeen Miss Anesthesia: General endotracheal Indications: Cavovarus Rachel Chase is a 80 year old individuals had a previous decompression fusion from L2-L5.  She has had significant problems with right greater than left leg pain and gluteal pain and was found to have advanced spondylitic degenerative changes at the L5-S1 level.  She is advised regarding surgical decompression and arthrodesis but this would include fixation to the pelvis.  Procedure: The patient was brought to the operating room supine on the stretcher.  After the smooth induction of general tracheal anesthesia, she was carefully turned prone.  The back was prepped with alcohol DuraPrep and draped in a sterile fashion.  The previously made midline incision was reopened and the dissection was carried down to the lumbodorsal fascia this was opened on either side of midline to expose the spinous process and laminar arch of L5 and the sacrum.  The hardware in the lower aspect of her lumbar spine was also exposed on either lateral gutter.  On the right side it was exposed up to the interval between the L3 and L4 screws where there was the presence of a transverse connector.  On the left side a similar exposure was obtained and here the adequate gap to place a transverse connector was at the area above the L5 screw as there was no L4 screw.  The hardware was loosened slightly on the right side to accommodate placing a transverse side connector to allow attachment of the rod.  Once this was  secured laminectomy was completed and the common dural tube was explored and the region of the L5 nerve root was identified and decompressed out laterally the S1 nerve root was decompressed in a similar fashion.  The disc space was then isolated carefully and once isolated adequately the disc space was opened.  A combination of osteotomes was used to open the disc space slightly and this allowed insertion of the 1st a 6 mm disc cleaner followed by a 7 and then an 8 mm spacer cleaner the disc space was opened gradually and ultimately it was felt that a 9 mm tall by 23 mm long spacer with 8 degrees of lordosis would fit best into this interval.  Once all the discectomy was completed care was taken to make sure that the L5 and the S1 nerve roots were well decompressed and protected.  Total of 6 cc of bone graft which was autograft allograft and infuse was packed into the interval on either side along with the 2 cages.  Then pedicle entry sites were chosen in the sacrum.  Here 6.5 x 45 mm screws were placed into the sacrum.  Attention was then turned to the pelvis where 7.5 x 80 mm screws were placed into either pelvic rim by using a palpatory mass [place].  Side connectors were then used to the pelvic screws and the L5 screws to allow a rod to be passed between the side connector to the upper fixation and the L5 and pelvic fixations.  This was 1st done on the right side with a custom contoured rod that measured 100 mm in length.  On the  left side a similar rod was constructed and inserted.  The insertion was tightened in a neutral construct.  Lateral gutters were then packed with the remainder of the autograft allograft and infuse between L5 and S1.  Hemostasis was then checked in the soft tissues and when verified care was taken to make sure that the L5 and S1 nerve roots remained decompressed.  The lumbodorsal fascia was then closed with #1 Vicryl in interrupted fashion 2-0 Vicryl was used in the subcutaneous tissues  and 3-0 Vicryl subcuticularly.  A dry sterile dressing was applied to the skin blood loss is estimated at 600 cc no Cell Saver was used.Marland Kitchen

## 2020-04-26 NOTE — Progress Notes (Signed)
Patient ID: Rachel Chase, female   DOB: [birth year], 80 y.o.   MRN: [id number] BP 102/48 with resting heart rate of 102. Though she is feeling fair, I believe that HCT of 25.6 is indicative of Acute blood loss anemia from surgery. I will order transfusion of 2 units prbc.

## 2020-04-26 NOTE — Evaluation (Signed)
Occupational Therapy Evaluation Patient Details Name: Rachel Chase MRN: [id number] DOB: Jul 25, 1940 Today's Date: 04/26/2020    History of Present Illness 80 y.o. female presenting with L5-S1 degenerative spondylolisthesis and lumbar radiculopathy s/p  decompression and fusion. PMHx significant for L3-4 and L4-5 ALIF 2020, breast cancer s/p lumpectomy and radiation ~2015, CAD, HLD, HTN, & MI.   Clinical Impression   PTA patient was living at [facility] in an independent living apartment and was independent with ADLs/IADLs without AD. Patient reports driving (day-time only) and preparing small meals for herself. Patient reports getting lunch and dinner from the dining hall. Zigmund Daniel is A&Ox4 and follows multi-step verbal commands appropriately. Patient currently presents near baseline level of function demonstrating observed ADLs including UB/LB dressing, ADL transfers, and grooming standing at sink level with Mod I without AD. OT provided education on spinal precautions, home set-up to maximize safety and independence with self-care tasks, and education on activity pacing 2/2 SOB with light to moderate activity. Patient expressed verbal understanding. Patient also able to don/doff lumbar corset independently. Patient does not require continued acute occupational therapy services with OT to sign off at this time. Recommendation for return to ILF apartment with PRN support from family/friends.        Follow Up Recommendations  No OT follow up    Equipment Recommendations  None recommended by OT    Recommendations for Other Services       Precautions / Restrictions Precautions Precautions: Back Precaution Booklet Issued: Yes (comment) Precaution Comments: Reviewed written handout. Patient able to recall 3/3 back precautions. Required Braces or Orthoses: Spinal Brace Spinal Brace: Lumbar corset Restrictions Weight Bearing Restrictions: No      Mobility Bed Mobility Overal bed mobility:  Modified Independent             General bed mobility comments: Supine to EOB with good return demo of log rolling technique. No use of bed rail.    Transfers Overall transfer level: Modified independent Equipment used: None             General transfer comment: Sit to stand x several trials with Mod I for slightly increased time. No use of AD.    Balance Overall balance assessment: Mild deficits observed, not formally tested                                         ADL either performed or assessed with clinical judgement   ADL Overall ADL's : At baseline                                       General ADL Comments: Patient demonstrates observed ADLs with Mod I without AD.     Vision Baseline Vision/History: Wears glasses Wears Glasses:  (Bifocals) Patient Visual Report: No change from baseline       Perception     Praxis      Pertinent Vitals/Pain Pain Assessment: 0-10 Pain Score: 4  Pain Location: Low back and R leg Pain Descriptors / Indicators: Aching;Sore Pain Intervention(s): Limited activity within patient's tolerance;Monitored during session;Repositioned;Patient requesting pain meds-RN notified     Hand Dominance Right   Extremity/Trunk Assessment Upper Extremity Assessment Upper Extremity Assessment: Overall WFL for tasks assessed   Lower Extremity Assessment Lower Extremity Assessment: Defer to PT evaluation  Cervical / Trunk Assessment Cervical / Trunk Assessment: Other exceptions Cervical / Trunk Exceptions: s/p spinal surgery.   Communication Communication Communication: No difficulties   Cognition Arousal/Alertness: Awake/alert Behavior During Therapy: WFL for tasks assessed/performed Overall Cognitive Status: Within Functional Limits for tasks assessed                                     General Comments  Clean, dry dressing at incision. Patient with SOB with light to moderate  activity. Education on energy conservation techniques and importance of taking rest breaks as needed.    Exercises     Shoulder Instructions      Home Living Family/patient expects to be discharged to:: Other (Comment) ([facility] (independent living apartment)) Living Arrangements: Alone Available Help at Discharge: Friend(s);Available PRN/intermittently Type of Home: Independent living facility Home Access: Level entry     Home Layout: One level     Bathroom Shower/Tub: Occupational psychologist: Handicapped height     Home Equipment: Environmental consultant - 2 wheels;Shower seat - built in;Grab bars - toilet;Grab bars - tub/shower;Hand held shower head;Adaptive equipment Adaptive Equipment: Reacher        Prior Functioning/Environment Level of Independence: Independent        Comments: Independent with ADLs/IADLs including light meal prep and driving (daytime only). Gets most meals from dining hall.        OT Problem List: Pain      OT Treatment/Interventions:      OT Goals(Current goals can be found in the care plan section) Acute Rehab OT Goals Patient Stated Goal: To return to ILF apartment independently. OT Goal Formulation: With patient  OT Frequency:     Barriers to D/C:            Co-evaluation              AM-PAC OT "6 Clicks" Daily Activity     Outcome Measure Help from another person eating meals?: None Help from another person taking care of personal grooming?: None Help from another person toileting, which includes using toliet, bedpan, or urinal?: None Help from another person bathing (including washing, rinsing, drying)?: None Help from another person to put on and taking off regular upper body clothing?: None Help from another person to put on and taking off regular lower body clothing?: None 6 Click Score: 24   End of Session Equipment Utilized During Treatment: Back brace Nurse Communication: Mobility status;Patient requests pain  meds  Activity Tolerance: Patient tolerated treatment well Patient left: in chair;with call bell/phone within reach  OT Visit Diagnosis: Muscle weakness (generalized) (M62.81)                Time: 7741-2878 OT Time Calculation (min): 22 min Charges:  OT General Charges $OT Visit: 1 Visit OT Evaluation $OT Eval Low Complexity: 1 Low  Chariti Havel H. OTR/L Supplemental OT, Department of rehab services [phone]  Sharisa Toves R H. 04/26/2020, 9:08 AM

## 2020-04-26 NOTE — Progress Notes (Signed)
Pt refused inhaler. Pt stated she brought her inhaler from home.

## 2020-04-27 ENCOUNTER — Other Ambulatory Visit (HOSPITAL_COMMUNITY): Payer: Self-pay | Admitting: Neurological Surgery

## 2020-04-27 LAB — CBC
HCT: 31.8 % — ABNORMAL LOW (ref 36.0–46.0)
Hemoglobin: 11 g/dL — ABNORMAL LOW (ref 12.0–15.0)
MCH: 30.1 pg (ref 26.0–34.0)
MCHC: 34.6 g/dL (ref 30.0–36.0)
MCV: 86.9 fL (ref 80.0–100.0)
Platelets: 194 10*3/uL (ref 150–400)
RBC: 3.66 MIL/uL — ABNORMAL LOW (ref 3.87–5.11)
RDW: 14.6 % (ref 11.5–15.5)
WBC: 11.1 10*3/uL — ABNORMAL HIGH (ref 4.0–10.5)
nRBC: 0 % (ref 0.0–0.2)

## 2020-04-27 MED ORDER — OXYCODONE-ACETAMINOPHEN 5-325 MG PO TABS: 1.0000 | ORAL_TABLET | ORAL | 0 refills | Status: DC | PRN

## 2020-04-27 MED ORDER — CYCLOBENZAPRINE HCL 5 MG PO TABS: 5.0000 mg | ORAL_TABLET | Freq: Three times a day (TID) | ORAL | 0 refills | Status: DC | PRN

## 2020-04-27 MED FILL — Thrombin For Soln 5000 Unit: CUTANEOUS | Qty: 5000 | Status: AC

## 2020-04-27 NOTE — Progress Notes (Signed)
Physical Therapy Treatment Patient Details Name: Rachel Chase MRN: [id number] DOB: 09/13/1940 Today's Date: 04/27/2020    History of Present Illness 80 y.o. female presenting with L5-S1 degenerative spondylolisthesis and lumbar radiculopathy s/p  decompression and fusion. PMHx significant for L3-4 and L4-5 ALIF 2020, breast cancer s/p lumpectomy and radiation ~2015, CAD, HLD, HTN, & MI.    PT Comments    Pt progressing well with post-op mobility. She was able to demonstrate transfers and ambulation with gross supervision for safety and no AD. Pt continues to experience 3/4-4/4 DOE. After ambulation O2 sats decreased to 90% however recovered to 95% within 2 minutes on RA. Pt was educated on precautions, brace application/wearing schedule, appropriate activity progression, and car transfer. Will continue to follow.      Follow Up Recommendations  No PT follow up;Supervision for mobility/OOB     Equipment Recommendations  None recommended by PT    Recommendations for Other Services       Precautions / Restrictions Precautions Precautions: Back Precaution Booklet Issued: Yes (comment) Precaution Comments: Reviewed written handout. Patient able to recall 3/3 back precautions. Required Braces or Orthoses: Spinal Brace Spinal Brace: Lumbar corset Restrictions Weight Bearing Restrictions: No    Mobility  Bed Mobility Overal bed mobility: Modified Independent             General bed mobility comments: VC's for optimal log roll. HOB flat and rails lowered to simulate home environment.    Transfers Overall transfer level: Modified independent Equipment used: None             General transfer comment: Pt demonstrated proper hand placement on seated surface for safety. VC's for improved posture and hand placement on seated surface for safety.  Ambulation/Gait Ambulation/Gait assistance: Supervision Gait Distance (Feet): 250 Feet Assistive device: None Gait  Pattern/deviations: Step-through pattern;Decreased stride length;Trunk flexed Gait velocity: Decreased Gait velocity interpretation: 1.31 - 2.62 ft/sec, indicative of limited community ambulator General Gait Details: VC's for improved posture and energy conservation. Pt SOB throughout ambulation and required a standing rest break to catch her breath before proceeding. 3/4 DOE by end of gait training but O2 sats 99% on RA.   Stairs             Wheelchair Mobility    Modified Rankin (Stroke Patients Only)       Balance Overall balance assessment: Mild deficits observed, not formally tested                                          Cognition Arousal/Alertness: Awake/alert Behavior During Therapy: WFL for tasks assessed/performed Overall Cognitive Status: Within Functional Limits for tasks assessed                                        Exercises      General Comments        Pertinent Vitals/Pain Pain Assessment: Faces Faces Pain Scale: Hurts a little bit Pain Location: Low back and R leg Pain Descriptors / Indicators: Aching;Sore Pain Intervention(s): Limited activity within patient's tolerance;Monitored during session;Repositioned    Home Living                      Prior Function            PT  Goals (current goals can now be found in the care plan section) Acute Rehab PT Goals Patient Stated Goal: To return to ILF apartment independently. PT Goal Formulation: With patient Time For Goal Achievement: 05/03/20 Potential to Achieve Goals: Good Progress towards PT goals: Progressing toward goals    Frequency    Min 5X/week      PT Plan Current plan remains appropriate    Co-evaluation              AM-PAC PT "6 Clicks" Mobility   Outcome Measure  Help needed turning from your back to your side while in a flat bed without using bedrails?: None Help needed moving from lying on your back to sitting on  the side of a flat bed without using bedrails?: None Help needed moving to and from a bed to a chair (including a wheelchair)?: None Help needed standing up from a chair using your arms (e.g., wheelchair or bedside chair)?: None Help needed to walk in hospital room?: A Little Help needed climbing 3-5 steps with a railing? : A Little 6 Click Score: 22    End of Session Equipment Utilized During Treatment: Gait belt;Back brace Activity Tolerance: Patient tolerated treatment well Patient left: in bed;with call bell/phone within reach Nurse Communication: Mobility status PT Visit Diagnosis: Unsteadiness on feet (R26.81);Pain Pain - part of body:  (back; R LE)     Time: 0812-0826 PT Time Calculation (min) (ACUTE ONLY): 14 min  Charges:  $Gait Training: 8-22 mins                     Rolinda Roan, PT, DPT Acute Rehabilitation Services Pager: [phone] Office: [phone]    Thelma Comp 04/27/2020, 8:31 AM

## 2020-04-27 NOTE — Discharge Instructions (Signed)
Wound Care Remove outer dressing in 2-3 days Leave incision open to air. You may shower. Do not scrub directly on incision.  Do not put any creams, lotions, or ointments on incision. Activity Walk each and every day, increasing distance each day. No lifting greater than 5 lbs.  Avoid bending, arching, and twisting. No driving for 2 weeks; may ride as a passenger locally. If provided with back brace, wear when out of bed.  It is not necessary to wear in bed. Diet Resume your normal diet.   Call Your Doctor If Any of These Occur Redness, drainage, or swelling at the wound.  Temperature greater than 101 degrees. Severe pain not relieved by pain medication. Incision starts to come apart. Follow Up Appt Call today for appointment in 3 weeks [phone]) or for problems.  If you have any hardware placed in your spine, you will need an x-ray before your appointment.  

## 2020-04-27 NOTE — Discharge Summary (Signed)
Physician Discharge Summary  Patient ID: Rachel Chase MRN: [id number] DOB/AGE: 80-25-1942 80 y.o.  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  Discharge Diagnoses: Spondylosis and stenosis L5-S1.  Status post fusion L2-L5.  Lumbar radiculopathy.  Acute blood loss anemia. Active Problems:   Lumbar stenosis with neurogenic claudication   Discharged Condition: good  Hospital Course: Patient was admitted to undergo surgical decompression at L5-S1 with revision of hardware for fixation to the pelvis.  She tolerated surgery well.  However postoperatively was noted that she had greater than 3 g hemoglobin drop and had some mild symptoms of tachycardia and decreased blood pressure with ambulation.  She was transfused 2 units of packed red cells for acute blood loss anemia.  She recovered well her radicular pain is improved.  Consults: None  Significant Diagnostic Studies: None  Treatments: surgery: Decompression L5-S1 with fixation from L2 to the pelvis.  Discharge Exam: Blood pressure (!) 118/47, pulse 96, temperature 98.4 F (36.9 C), temperature source Oral, resp. rate 17, height 5\' 8"  (1.727 m), weight 78.5 kg, last menstrual period 02/27/2008, SpO2 96 %. Incision is clean and dry Station and gait are intact.  Disposition: Discharge disposition: 01-Home or Self Care       Discharge Instructions    Call MD for:  redness, tenderness, or signs of infection (pain, swelling, redness, odor or green/yellow discharge around incision site)   Complete by: As directed    Call MD for:  severe uncontrolled pain   Complete by: As directed    Call MD for:  temperature >100.4   Complete by: As directed    Diet - low sodium heart healthy   Complete by: As directed    Discharge wound care:   Complete by: As directed    Okay to shower. Do not apply salves or appointments to incision. No heavy lifting with  the upper extremities greater than 10 pounds. May resume driving when not requiring pain medication and patient feels comfortable with doing so.   Incentive spirometry RT   Complete by: As directed    Increase activity slowly   Complete by: As directed      Allergies as of 04/27/2020      Reactions   Dilaudid [hydromorphone Hcl] Nausea And Vomiting   Penicillins Rash      Medication List    TAKE these medications   albuterol 108 (90 Base) MCG/ACT inhaler Commonly known as: VENTOLIN HFA Inhale 1-2 puffs into the lungs every 6 (six) hours as needed for wheezing or shortness of breath.   aspirin EC 81 MG tablet Take 81 mg by mouth daily with supper.   atorvastatin 40 MG tablet Commonly known as: LIPITOR Take 40 mg by mouth at bedtime.   budesonide-formoterol 160-4.5 MCG/ACT inhaler Commonly known as: SYMBICORT Inhale 2 puffs into the lungs daily.   cyclobenzaprine 5 MG tablet Commonly known as: FLEXERIL Take 1 tablet (5 mg total) by mouth 3 (three) times daily as needed for muscle spasms.   hydrochlorothiazide 25 MG tablet Commonly known as: HYDRODIURIL Take 25 mg by mouth daily.   HYDROcodone-acetaminophen 5-325 MG tablet Commonly known as: NORCO/VICODIN Take 0.5 tablets by mouth in the morning, at noon, in the evening, and at bedtime.   lisinopril 40 MG tablet Commonly known as: ZESTRIL Take 40 mg by mouth daily.   LORazepam 1 MG tablet Commonly known as: ATIVAN Take 1 mg by mouth at bedtime.  metoprolol tartrate 50 MG tablet Commonly known as: LOPRESSOR Take 50 mg by mouth 2 (two) times daily.   Nitrostat 0.4 MG SL tablet Generic drug: nitroGLYCERIN DISSOLVE ONE TABLET UNDER THE TONGUE AS NEEDED FOR CHEST PAIN AS DIRECTED What changed: See the new instructions.   oxyCODONE-acetaminophen 5-325 MG tablet Commonly known as: PERCOCET/ROXICET Take 1-2 tablets by mouth every 4 (four) hours as needed for moderate pain or severe pain.   PRESERVISION AREDS 2  PO Take 1 tablet by mouth in the morning and at bedtime.   SOOTHE HYDRATION OP Place 1 drop into both eyes daily as needed (dry eyes).   Vitamin D (Ergocalciferol) 1.25 MG (50000 UNIT) Caps capsule Commonly known as: DRISDOL Take 1 capsule (50,000 Units total) by mouth every 7 (seven) days. TAKE ONE CAPSULE (50,000 UNITS) BY MOUTH EVERY 14 DAYS What changed:   when to take this  additional instructions            Discharge Care Instructions  (From admission, onward)         Start     Ordered   04/27/20 0000  Discharge wound care:       Comments: Okay to shower. Do not apply salves or appointments to incision. No heavy lifting with the upper extremities greater than 10 pounds. May resume driving when not requiring pain medication and patient feels comfortable with doing so.   04/27/20 0907           Signed: Blanchie Dessert Rodel Glaspy 04/27/2020, 9:07 AM

## 2020-04-27 NOTE — Plan of Care (Signed)
Patient is discharged from room 3C05 at this time. Alert and in stable condition. IV site d/c'd and instructions read to patient with understanding verbalized and all questions answered. Left unit via wheelchair with all belongings at side. Patient has an appointment with Dr Ellene Route in 3 weeks

## 2020-04-28 LAB — TYPE AND SCREEN
ABO/RH(D): A POS
Antibody Screen: NEGATIVE
Unit division: 0
Unit division: 0

## 2020-04-28 LAB — BPAM RBC
Blood Product Expiration Date: 202203242359
Blood Product Expiration Date: 202203242359
ISSUE DATE / TIME: 202203012055
ISSUE DATE / TIME: 202203020001
Unit Type and Rh: 6200
Unit Type and Rh: 6200

## 2020-05-30 ENCOUNTER — Other Ambulatory Visit (HOSPITAL_COMMUNITY): Payer: Self-pay

## 2020-06-15 ENCOUNTER — Other Ambulatory Visit (HOSPITAL_COMMUNITY): Payer: Self-pay

## 2020-06-16 ENCOUNTER — Other Ambulatory Visit (HOSPITAL_COMMUNITY): Payer: Self-pay

## 2020-06-17 ENCOUNTER — Other Ambulatory Visit (HOSPITAL_COMMUNITY): Payer: Self-pay

## 2020-06-20 ENCOUNTER — Other Ambulatory Visit (HOSPITAL_COMMUNITY): Payer: Self-pay

## 2020-06-22 ENCOUNTER — Other Ambulatory Visit (HOSPITAL_COMMUNITY): Payer: Self-pay

## 2020-06-22 MED ORDER — LORAZEPAM 1 MG PO TABS
ORAL_TABLET | ORAL | 0 refills | Status: DC
  Filled 2020-06-22: qty 30, 30d supply, fill #0

## 2020-06-22 MED ORDER — TRAMADOL HCL 50 MG PO TABS
ORAL_TABLET | ORAL | 0 refills | Status: DC
  Filled 2020-06-22: qty 40, 10d supply, fill #0

## 2020-06-24 ENCOUNTER — Other Ambulatory Visit (HOSPITAL_COMMUNITY): Payer: Self-pay

## 2020-06-24 MED ORDER — HYDROCODONE-ACETAMINOPHEN 5-325 MG PO TABS
ORAL_TABLET | ORAL | 0 refills | Status: DC
  Filled 2020-06-24: qty 40, 13d supply, fill #0

## 2020-07-13 ENCOUNTER — Other Ambulatory Visit (HOSPITAL_COMMUNITY): Payer: Self-pay

## 2020-07-13 MED FILL — Budesonide-Formoterol Fumarate Dihyd Aerosol 160-4.5 MCG/ACT: RESPIRATORY_TRACT | 30 days supply | Qty: 10.2 | Fill #0 | Status: AC

## 2020-07-13 MED FILL — Atorvastatin Calcium Tab 40 MG (Base Equivalent): ORAL | 90 days supply | Qty: 90 | Fill #0 | Status: AC

## 2020-07-27 ENCOUNTER — Other Ambulatory Visit (HOSPITAL_COMMUNITY): Payer: Self-pay

## 2020-07-27 MED ORDER — HYDROCODONE-ACETAMINOPHEN 5-325 MG PO TABS
ORAL_TABLET | ORAL | 0 refills | Status: DC
  Filled 2020-07-27: qty 40, 20d supply, fill #0

## 2020-07-27 MED ORDER — LORAZEPAM 1 MG PO TABS
ORAL_TABLET | ORAL | 5 refills | Status: DC
  Filled 2020-07-27: qty 30, 30d supply, fill #0
  Filled 2020-08-21 – 2020-08-25 (×2): qty 30, 30d supply, fill #1
  Filled 2020-09-23: qty 30, 30d supply, fill #0
  Filled 2020-09-23: qty 30, 30d supply, fill #2
  Filled 2020-10-19 – 2020-10-24 (×2): qty 30, 30d supply, fill #1
  Filled 2020-11-18 – 2020-11-22 (×2): qty 30, 30d supply, fill #2
  Filled 2020-12-20: qty 30, 30d supply, fill #3

## 2020-08-16 ENCOUNTER — Other Ambulatory Visit (HOSPITAL_COMMUNITY): Payer: Self-pay

## 2020-08-16 MED ORDER — HYDROCHLOROTHIAZIDE 25 MG PO TABS
ORAL_TABLET | ORAL | 3 refills | Status: DC
  Filled 2020-08-16: qty 90, 90d supply, fill #0
  Filled 2020-11-05: qty 90, 90d supply, fill #1
  Filled 2020-11-08: qty 90, 90d supply, fill #0
  Filled 2021-02-09: qty 90, 90d supply, fill #1
  Filled 2021-05-14: qty 90, 90d supply, fill #2

## 2020-08-16 MED ORDER — LISINOPRIL 40 MG PO TABS
ORAL_TABLET | Freq: Every day | ORAL | 3 refills | Status: DC
  Filled 2020-08-16: qty 90, 90d supply, fill #0
  Filled 2020-11-18: qty 90, 90d supply, fill #1
  Filled 2020-11-18: qty 90, 90d supply, fill #0
  Filled 2021-02-09: qty 90, 90d supply, fill #1
  Filled 2021-05-14: qty 90, 90d supply, fill #2

## 2020-08-16 MED ORDER — METOPROLOL TARTRATE 50 MG PO TABS
ORAL_TABLET | ORAL | 3 refills | Status: DC
  Filled 2020-08-16: qty 180, 90d supply, fill #0
  Filled 2020-11-18: qty 180, 90d supply, fill #1
  Filled 2020-11-18: qty 180, 90d supply, fill #0
  Filled 2021-02-09: qty 180, 90d supply, fill #1
  Filled 2021-05-20: qty 180, 90d supply, fill #2

## 2020-08-17 DIAGNOSIS — I10 Essential (primary) hypertension: Secondary | ICD-10-CM | POA: Diagnosis not present

## 2020-08-17 DIAGNOSIS — M4180 Other forms of scoliosis, site unspecified: Secondary | ICD-10-CM | POA: Diagnosis not present

## 2020-08-22 ENCOUNTER — Other Ambulatory Visit (HOSPITAL_COMMUNITY): Payer: Self-pay

## 2020-08-23 ENCOUNTER — Other Ambulatory Visit (HOSPITAL_COMMUNITY): Payer: Self-pay

## 2020-08-25 ENCOUNTER — Other Ambulatory Visit (HOSPITAL_COMMUNITY): Payer: Self-pay

## 2020-08-30 ENCOUNTER — Other Ambulatory Visit (HOSPITAL_COMMUNITY): Payer: Self-pay

## 2020-08-30 ENCOUNTER — Other Ambulatory Visit: Payer: Self-pay | Admitting: Obstetrics & Gynecology

## 2020-08-30 DIAGNOSIS — E559 Vitamin D deficiency, unspecified: Secondary | ICD-10-CM

## 2020-08-30 MED ORDER — VITAMIN D (ERGOCALCIFEROL) 1.25 MG (50000 UNIT) PO CAPS
ORAL_CAPSULE | ORAL | 1 refills | Status: DC
  Filled 2020-08-30: qty 6, 84d supply, fill #0

## 2020-09-05 ENCOUNTER — Other Ambulatory Visit (HOSPITAL_COMMUNITY): Payer: Self-pay

## 2020-09-05 MED ORDER — BUDESONIDE-FORMOTEROL FUMARATE 160-4.5 MCG/ACT IN AERO
INHALATION_SPRAY | RESPIRATORY_TRACT | 5 refills | Status: DC
  Filled 2020-09-05: qty 10.2, 30d supply, fill #0
  Filled 2020-11-05: qty 10.2, 30d supply, fill #1
  Filled 2020-11-08: qty 10.2, 30d supply, fill #0
  Filled 2020-12-12: qty 10.2, 30d supply, fill #1
  Filled 2021-01-10 – 2021-02-17 (×2): qty 10.2, 30d supply, fill #2

## 2020-09-06 ENCOUNTER — Other Ambulatory Visit (HOSPITAL_COMMUNITY): Payer: Self-pay

## 2020-09-06 MED ORDER — TRAMADOL HCL 50 MG PO TABS
ORAL_TABLET | ORAL | 0 refills | Status: DC
  Filled 2020-09-06: qty 30, 30d supply, fill #0

## 2020-09-09 ENCOUNTER — Telehealth: Payer: Self-pay | Admitting: Interventional Cardiology

## 2020-09-09 NOTE — Telephone Encounter (Signed)
Patient states she has a new PCP and would like it added to her chart. She states she sees Dr. Lajean Manes at [facility] [place]

## 2020-09-09 NOTE — Telephone Encounter (Signed)
PCP updated.

## 2020-09-13 ENCOUNTER — Other Ambulatory Visit (HOSPITAL_COMMUNITY): Payer: Self-pay

## 2020-09-13 MED ORDER — HYDROCODONE-ACETAMINOPHEN 5-325 MG PO TABS
1.0000 | ORAL_TABLET | Freq: Three times a day (TID) | ORAL | 0 refills | Status: DC
  Filled 2020-09-13: qty 40, 14d supply, fill #0

## 2020-09-14 ENCOUNTER — Other Ambulatory Visit (HOSPITAL_COMMUNITY): Payer: Self-pay

## 2020-09-16 ENCOUNTER — Other Ambulatory Visit (HOSPITAL_COMMUNITY): Payer: Self-pay

## 2020-09-23 ENCOUNTER — Other Ambulatory Visit (HOSPITAL_COMMUNITY): Payer: Self-pay

## 2020-10-03 ENCOUNTER — Other Ambulatory Visit (HOSPITAL_COMMUNITY): Payer: Self-pay

## 2020-10-03 MED ORDER — ATORVASTATIN CALCIUM 40 MG PO TABS
40.0000 mg | ORAL_TABLET | Freq: Every day | ORAL | 3 refills | Status: DC
  Filled 2020-10-03: qty 90, 90d supply, fill #0
  Filled 2021-01-09: qty 90, 90d supply, fill #1
  Filled 2021-04-17: qty 90, 90d supply, fill #2
  Filled 2021-08-10: qty 90, 90d supply, fill #3

## 2020-10-11 ENCOUNTER — Other Ambulatory Visit (HOSPITAL_COMMUNITY): Payer: Self-pay

## 2020-10-12 ENCOUNTER — Other Ambulatory Visit (HOSPITAL_COMMUNITY): Payer: Self-pay

## 2020-10-12 MED ORDER — TRAMADOL HCL 50 MG PO TABS
50.0000 mg | ORAL_TABLET | Freq: Every day | ORAL | 0 refills | Status: DC | PRN
  Filled 2020-10-12: qty 30, 30d supply, fill #0

## 2020-10-13 ENCOUNTER — Other Ambulatory Visit (HOSPITAL_COMMUNITY): Payer: Self-pay

## 2020-10-20 ENCOUNTER — Other Ambulatory Visit (HOSPITAL_COMMUNITY): Payer: Self-pay

## 2020-10-24 ENCOUNTER — Other Ambulatory Visit (HOSPITAL_COMMUNITY): Payer: Self-pay

## 2020-11-08 ENCOUNTER — Other Ambulatory Visit (HOSPITAL_COMMUNITY): Payer: Self-pay

## 2020-11-18 ENCOUNTER — Other Ambulatory Visit (HOSPITAL_COMMUNITY): Payer: Self-pay

## 2020-11-18 MED ORDER — HYDROCODONE-ACETAMINOPHEN 5-325 MG PO TABS
1.0000 | ORAL_TABLET | Freq: Two times a day (BID) | ORAL | 0 refills | Status: DC
  Filled 2020-11-18: qty 40, 20d supply, fill #0

## 2020-11-21 ENCOUNTER — Other Ambulatory Visit (HOSPITAL_COMMUNITY): Payer: Self-pay

## 2020-11-22 ENCOUNTER — Other Ambulatory Visit (HOSPITAL_COMMUNITY): Payer: Self-pay

## 2020-11-29 ENCOUNTER — Ambulatory Visit (HOSPITAL_BASED_OUTPATIENT_CLINIC_OR_DEPARTMENT_OTHER): Payer: Medicare Other | Admitting: Obstetrics & Gynecology

## 2020-11-30 ENCOUNTER — Ambulatory Visit (INDEPENDENT_AMBULATORY_CARE_PROVIDER_SITE_OTHER): Payer: Medicare Other | Admitting: Obstetrics & Gynecology

## 2020-11-30 ENCOUNTER — Other Ambulatory Visit (HOSPITAL_COMMUNITY): Payer: Self-pay

## 2020-11-30 ENCOUNTER — Encounter (HOSPITAL_BASED_OUTPATIENT_CLINIC_OR_DEPARTMENT_OTHER): Payer: Self-pay | Admitting: Obstetrics & Gynecology

## 2020-11-30 ENCOUNTER — Other Ambulatory Visit: Payer: Self-pay

## 2020-11-30 VITALS — BP 136/65 | HR 73 | Ht 66.0 in | Wt 175.2 lb

## 2020-11-30 DIAGNOSIS — Z853 Personal history of malignant neoplasm of breast: Secondary | ICD-10-CM

## 2020-11-30 DIAGNOSIS — E559 Vitamin D deficiency, unspecified: Secondary | ICD-10-CM

## 2020-11-30 DIAGNOSIS — Z9189 Other specified personal risk factors, not elsewhere classified: Secondary | ICD-10-CM

## 2020-11-30 DIAGNOSIS — C50312 Malignant neoplasm of lower-inner quadrant of left female breast: Secondary | ICD-10-CM

## 2020-11-30 DIAGNOSIS — I2510 Atherosclerotic heart disease of native coronary artery without angina pectoris: Secondary | ICD-10-CM

## 2020-11-30 MED ORDER — VITAMIN D (ERGOCALCIFEROL) 1.25 MG (50000 UNIT) PO CAPS
50000.0000 [IU] | ORAL_CAPSULE | ORAL | 3 refills | Status: DC
  Filled 2020-11-30 – 2020-12-12 (×2): qty 6, 84d supply, fill #0
  Filled 2021-04-24: qty 6, 84d supply, fill #1
  Filled 2021-07-25: qty 6, 84d supply, fill #2
  Filled 2021-10-29: qty 6, 84d supply, fill #3

## 2020-11-30 NOTE — Patient Instructions (Signed)
[employer]

## 2020-11-30 NOTE — Progress Notes (Signed)
79 y.o. [id number] Widowed White or Caucasian female here for breast and pelvic exam.  I am also following her for history of breast cancer.  Has no complaints.  Last MMG was 01/2020.  Is planning on doing this again this year.  Did take anastrozole for almost 5 years.  Denies vaginal bleeding.  On Vit D.  Admits she doesn't take this regularly and sometimes forgets.  Lab hasn't been tested in 2 years.    Patient's last menstrual period was 02/27/2008.          Sexually active: No.  H/O STD:  no  Health Maintenance: PCP:  Dr. Felipa Eth.  Last wellness appt was earlier this year.  Did blood work at that appt:  yes Vaccines are up to date:  reviewed with pt Colonoscopy:  2009.  Has aged out of this. MMG:  02/03/2020 Negative BMD:  will plan with Dr. Felipa Eth Last pap smear:  04/30/2017 Negative.   H/o abnormal pap smear:  no    reports that she quit smoking about 22 years ago. Her smoking use included cigarettes. She has a 67.50 pack-year smoking history. She has never used smokeless tobacco. She reports current alcohol use. She reports that she does not use drugs.  Past Medical History:  Diagnosis Date   Anxiety    Arthritis    Back pain    Lower Back pain - takes Cortisone shots   Bone spur 2009   lower back   Breast cancer ([place]) 2015   finished radiation 12/15   Bronchitis    CAD (coronary artery disease)    a. cath 12/2010 for cardiac arrest showed    Cataract    Complication of anesthesia    throat irrition from past intubations, intubation granulomas   Coronary artery disease    Dyspnea on exertion    History of cardiac arrest 12/2010   Hyperlipemia    Hypertension    MI (myocardial infarction) ([place]) 12/2010   Was followed by Dr. 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 (followed by Lanelle Bal, MD)   Radiation 01/06/14-02/04/14   left breast    Rosacea 2008   both  eyes    Past Surgical History:  Procedure Laterality Date   ANTERIOR LAT LUMBAR FUSION N/A 11/18/2018   Procedure: Lumbar One-Two Anterolateral lumbar decompression/fusion/lateral plate fixation;  Surgeon: Kristeen Miss, MD;  Location: [facility];  Service: Neurosurgery;  Laterality: N/A;  Lumbar One-Two Anterolateral lumbar decompression/fusion/lateral plate fixation   BACK SURGERY     BILIARY DILATION  05/29/2019   Procedure: BILIARY DILATION;  Surgeon: Clarene Essex, MD;  Location: [facility];  Service: Endoscopy;;   BREAST BIOPSY  8/10   fibrocystic changes   BREAST LUMPECTOMY Left 2015   BREAST LUMPECTOMY WITH NEEDLE LOCALIZATION AND AXILLARY SENTINEL LYMPH NODE BX Left 11/13/2013   Procedure: NEEDLE LOCALIZATION LEFT BREAST LUMPECTOMY WITH LEFT  AXILLARY SENTINEL LYMPH NODE BX;  Surgeon: Excell Seltzer, MD;  Location: [facility];  Service: General;  Laterality: Left;   CATARACT EXTRACTION     both   CHOLECYSTECTOMY N/A 05/30/2019   Procedure: LAPAROSCOPIC CHOLECYSTECTOMY;  Surgeon: Erroll Luna, MD;  Location: [facility];  Service: General;  Laterality: N/A;   CHOLECYSTECTOMY     dental implant     ERCP N/A 05/29/2019   Procedure: ENDOSCOPIC RETROGRADE CHOLANGIOPANCREATOGRAPHY (ERCP);  Surgeon: Clarene Essex, MD;  Location: [facility];  Service: Endoscopy;  Laterality: N/A;   EYE SURGERY     Laser Eye Surgery approx 2007   heart stent     failed-no stents   HYSTEROSCOPY  9/10   D&C (polyp)   LEFT HEART CATHETERIZATION WITH CORONARY ANGIOGRAM N/A 01/18/2011   Procedure: LEFT HEART CATHETERIZATION WITH CORONARY ANGIOGRAM;  Surgeon: Clent Demark, MD;  Location: [facility];  Service: Cardiovascular;  Laterality: N/A;   PERCUTANEOUS CORONARY STENT INTERVENTION (PCI-S) N/A 01/18/2011   Procedure: PERCUTANEOUS CORONARY STENT INTERVENTION (PCI-S);  Surgeon: Clent Demark, MD;  Location: [facility];  Service: Cardiovascular;  Laterality: N/A;   REMOVAL OF STONES  05/29/2019    Procedure: REMOVAL OF STONES;  Surgeon: Clarene Essex, MD;  Location: [facility];  Service: Endoscopy;;   SPHINCTEROTOMY  05/29/2019   Procedure: [name];  Surgeon: Clarene Essex, MD;  Location: [facility];  Service: Endoscopy;;   SPINAL FUSION     TONSILLECTOMY AND ADENOIDECTOMY     TUBAL LIGATION      Current Outpatient Medications  Medication Sig Dispense Refill   albuterol (VENTOLIN HFA) 108 (90 Base) MCG/ACT inhaler Inhale 1-2 puffs into the lungs every 6 (six) hours as needed for wheezing or shortness of breath.     Artificial Tear Solution (SOOTHE HYDRATION OP) Place 1 drop into both eyes daily as needed (dry eyes).     aspirin EC 81 MG tablet Take 81 mg by mouth daily with supper.     atorvastatin (LIPITOR) 40 MG tablet Take 1 tablet (40 mg total) by mouth daily. 90 tablet 3   budesonide-formoterol (SYMBICORT) 160-4.5 MCG/ACT inhaler INHALE 2 PUFFS IN THE LUNGS TWICE DAILY AS DIRECTED, RINSE AFTER USE 10.2 g 5   cyclobenzaprine (FLEXERIL) 5 MG tablet TAKE 1 TABLET (5 MG TOTAL) BY MOUTH 3 (THREE) TIMES DAILY AS NEEDED FOR MUSCLE SPASMS. 30 tablet 0   hydrochlorothiazide (HYDRODIURIL) 25 MG tablet Take 1 tablet by mouth in the morning Once a day 90 tablet 3   HYDROcodone-acetaminophen (NORCO/VICODIN) 5-325 MG tablet Take 1 tablet by mouth 2 (two) times daily every 8-12 hours as needed for pain. 40 tablet 0   lisinopril (ZESTRIL) 40 MG tablet TAKE 1 TABLET BY MOUTH EVERY DAY. 90 tablet 3   LORazepam (ATIVAN) 1 MG tablet Take 1 tablet by mouth at bedtime 30 tablet 5   metoprolol tartrate (LOPRESSOR) 50 MG tablet TAKE 1 TABLET BY MOUTH 2 TIMES DAILY 180 tablet 3   Multiple Vitamins-Minerals (PRESERVISION AREDS 2 PO) Take 1 tablet by mouth in the morning and at bedtime.     NITROSTAT 0.4 MG SL tablet DISSOLVE ONE TABLET UNDER THE TONGUE AS NEEDED FOR CHEST PAIN AS DIRECTED (Patient taking differently: Place 0.4 mg under the tongue every 5 (five) minutes x 3 doses as needed for chest  pain.) 25 tablet 2   traMADol (ULTRAM) 50 MG tablet Take 1 tablet (50 mg total) by mouth daily as needed. 30 tablet 0   Vitamin D, Ergocalciferol, (DRISDOL) 1.25 MG (50000 UNIT) CAPS capsule TAKE ONE CAPSULE (50,000 UNITS) BY MOUTH EVERY 14 DAYS 6 capsule 1   No current facility-administered medications for this visit.    Family History  Problem Relation Age of Onset   Congestive Heart Failure Father    CVA Father        MI, CAD   Coronary artery disease Father    Valvular heart disease Mother    Hypertension Mother    Rectal cancer Sister  Breast cancer Sister    Breast cancer Maternal Grandmother 88   Breast cancer Maternal Aunt    Cancer Sister     Review of Systems  All other systems reviewed and are negative.  Exam:   BP 136/65 (BP Location: Left Arm, Patient Position: Sitting, Cuff Size: Normal)   Pulse 73   Ht 5\' 6"  (1.676 m)   Wt 175 lb 3.2 oz (79.5 kg)   LMP 02/27/2008 Comment: tubal ligation  BMI 28.28 kg/m   Height: 5\' 6"  (167.6 cm)  General appearance: alert, cooperative and appears stated age Breasts:  Abdomen: soft, non-tender; bowel sounds normal; no masses,  no organomegaly Lymph nodes: Cervical, supraclavicular, and axillary nodes normal.  No abnormal inguinal nodes palpated Neurologic: Grossly normal  Pelvic: External genitalia:  no lesions              Urethra:  normal appearing urethra with no masses, tenderness or lesions              Bartholins and Skenes: normal                 Vagina: normal appearing vagina with atrophic changes and no discharge, no lesions              Cervix: no lesions              Pap taken: No. Bimanual Exam:  Uterus:  normal size, contour, position, consistency, mobility, non-tender              Adnexa: normal adnexa and no mass, fullness, tenderness               Rectovaginal: Confirms               Anus:  normal sphincter tone, no lesions  Chaperone, Octaviano Batty, CMA, was present for exam.  Assessment/Plan: 1.  GYN exam for high-risk Medicare patient - pap smear no longer indicated - MMG 01/2020 - Pt is going to discuss BMD with Dr. Felipa Eth - has aged out of colon cancer screening - Care Gaps were reviewed/updated.  2. 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 Coronary artery disease involving native coronary artery of native heart without angina pectoris  4. Vitamin D deficiency - VIt D level will be obtained today - Continue 50K every 14 days.

## 2020-12-01 LAB — VITAMIN D 25 HYDROXY (VIT D DEFICIENCY, FRACTURES): Vit D, 25-Hydroxy: 38.7 ng/mL (ref 30.0–100.0)

## 2020-12-08 ENCOUNTER — Other Ambulatory Visit (HOSPITAL_COMMUNITY): Payer: Self-pay

## 2020-12-12 ENCOUNTER — Other Ambulatory Visit (HOSPITAL_COMMUNITY): Payer: Self-pay

## 2020-12-13 ENCOUNTER — Other Ambulatory Visit: Payer: Self-pay

## 2020-12-13 ENCOUNTER — Encounter: Payer: Self-pay | Admitting: Physician Assistant

## 2020-12-13 ENCOUNTER — Ambulatory Visit: Payer: Medicare Other | Admitting: Physician Assistant

## 2020-12-13 VITALS — BP 110/40 | HR 76 | Ht 68.0 in | Wt 176.2 lb

## 2020-12-13 DIAGNOSIS — I10 Essential (primary) hypertension: Secondary | ICD-10-CM

## 2020-12-13 DIAGNOSIS — I2510 Atherosclerotic heart disease of native coronary artery without angina pectoris: Secondary | ICD-10-CM

## 2020-12-13 DIAGNOSIS — E7800 Pure hypercholesterolemia, unspecified: Secondary | ICD-10-CM | POA: Diagnosis not present

## 2020-12-13 NOTE — Progress Notes (Signed)
Cardiology Office Note:    Date:  12/13/2020   ID:  Rachel Chase, DOB Dec 31, 1940, MRN [id number]  PCP:  Rachel Manes, MD   [facility] Providers Cardiologist:  Rachel Grooms, MD     Referring MD: Rachel Bunting, MD   Chief Complaint:  F/u for CAD    Patient Profile:   Rachel Chase is a 80 y.o. female with:  Coronary artery disease  Known CTO of RCA S/p cardiac arrest in 2012 COPD Hypertension  Hyperlipidemia  LLL lung nodules/mass Breast CA Lumbar disc dz S/p cholecystectomy  Ex-smoker   Prior CV studies: GATED SPECT MYO PERF W/LEXISCAN STRESS 1D 08/24/2016 Normal Lexiscan nuclear stress test with no evidence for prior infarct or ischemia.  EF 71   ECHO COMPLETE WO IMAGING ENHANCING AGENT 08/24/2016 EF 55-60, no RWMA, GR 1 DD, mild MR    Cardiac catheterization 01/18/11 Her left main was short which was patent.  LAD has 20-30% proximal and 25-35% mid sequential stenosis.  Left circumflex has proximal 30% stenosis.  OM-1 and 2 are very small.  OM-3 is large which is patent.  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  History of Present Illness: Rachel Chase was last seen by Dr. Tamala Chase in 10/21.  She returns for f/u.  She is here alone.  Since last seen, she has had another back surgery.  This did take her little bit longer to recover from.  Dr. Ellene Chase did her surgery.  She has been doing well without chest pain.  She has chronic shortness of breath related to COPD.  Her breathing has been stable.  She has not had orthopnea, leg edema or syncope        Past Medical History:  Diagnosis Date   Anxiety    Arthritis    Back pain    Lower Back pain - takes Cortisone shots   Bone spur 2009   lower back   Breast cancer ([place]) 2015   finished radiation 12/15   Bronchitis    CAD (coronary artery disease)    a. 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   Artificial Tear Solution (SOOTHE HYDRATION OP) Place 1 drop into both eyes daily as needed (dry eyes).   aspirin EC 81 MG tablet Take 81 mg by mouth daily with supper.   atorvastatin (LIPITOR) 40 MG tablet Take 1 tablet (40 mg total) by mouth daily.   budesonide-formoterol (SYMBICORT) 160-4.5 MCG/ACT inhaler INHALE 2 PUFFS IN THE LUNGS TWICE DAILY AS DIRECTED, RINSE AFTER USE   hydrochlorothiazide (HYDRODIURIL) 25 MG tablet Take 1 tablet by mouth  in the morning Once a day   HYDROcodone-acetaminophen (NORCO/VICODIN) 5-325 MG tablet Take 1 tablet by mouth 2 (two) times daily every 8-12 hours as needed for pain.   lisinopril (ZESTRIL) 40 MG tablet TAKE 1 TABLET BY MOUTH EVERY DAY.   LORazepam (ATIVAN) 1 MG tablet Take 1 tablet by mouth at bedtime   metoprolol tartrate (LOPRESSOR) 50 MG tablet TAKE 1 TABLET BY MOUTH 2 TIMES DAILY   Multiple Vitamins-Minerals (PRESERVISION AREDS 2 PO) Take 1 tablet by mouth in the morning and at bedtime.   NITROSTAT 0.4 MG SL tablet DISSOLVE ONE TABLET UNDER THE TONGUE AS NEEDED FOR CHEST PAIN AS DIRECTED   traMADol (ULTRAM) 50 MG tablet Take 1 tablet (50 mg total) by mouth daily as needed.   Vitamin D, Ergocalciferol, (DRISDOL) 1.25 MG (50000 UNIT) CAPS capsule Take 1  capsule (50,000 Units total) by mouth every 14 (fourteen) days.    Allergies:   Dilaudid [hydromorphone hcl] and Penicillins   Social History   Tobacco Use   Smoking status: Former    Packs/day: 1.50    Years: 45.00    Pack years: 67.50    Types: Cigarettes    Quit date: 04/17/1998    Years since quitting: 22.6   Smokeless tobacco: Never   Tobacco comments:    nicorette gum  Vaping Use   Vaping Use: Never used  Substance Use Topics   Alcohol use: Yes    Comment: 1-2 a month   Drug use: No    Family Hx: The patient's family history includes Breast cancer in her maternal aunt and sister; Breast cancer (age of onset: 63) in her maternal grandmother; CVA in her father; Cancer in her sister; Congestive Heart Failure in her father; Coronary artery disease in her father; Hypertension in her mother; Rectal cancer in her sister; Valvular heart disease in her mother.  Review of Systems  Hematologic/Lymphatic: Bruises/bleeds easily.  Gastrointestinal:  Negative for hematochezia.  Genitourinary:  Negative for hematuria.    EKGs/Labs/Other Test Reviewed:    EKG:  EKG is   ordered today.  The ekg ordered today demonstrates NSR, HR 76, normal axis, nonspecific ST-T wave changes, QTC 434, similar to prior tracing  Recent Labs: 04/26/2020: BUN 24; Creatinine, Ser 1.00; Potassium 4.5; Sodium 138 04/27/2020: Hemoglobin 11.0; Platelets 194   Recent Lipid Panel Lab Results  Component Value Date/Time   CHOL 128 01/18/2011 08:00 PM   TRIG 103 01/18/2011 08:00 PM   HDL 47 01/18/2011 08:00 PM   LDLCALC 60 01/18/2011 08:00 PM   Labs obtained through Rachel Chase - personally reviewed and interpreted: 06/01/2020: Total cholesterol 142, HDL 57, LDL 70, triglycerides 75, K+ 4.3, ALT 13, TSH 2.98 04/27/2020: Hgb 11, creatinine 1.0   Risk Assessment/Calculations:          Physical Exam:    VS:  BP (!) 110/40   Pulse 76   Ht 5\' 8"  (1.727 m)   Wt 176 lb 3.2 oz (79.9 kg)   LMP 02/27/2008 Comment:  tubal ligation  SpO2 90%   BMI 26.79 kg/m     Wt Readings from Last 3 Encounters:  12/13/20 176 lb 3.2 oz (79.9 kg)  11/30/20 175 lb 3.2 oz (79.5 kg)  04/25/20 173 lb (78.5 kg)    Constitutional:      Appearance: Healthy appearance. Not in distress.  Neck:     Vascular: No carotid bruit. JVD normal.  Pulmonary:     Effort: Pulmonary effort is normal.  Breath sounds: No wheezing. No rales.  Cardiovascular:     Normal rate. Regular rhythm. Normal S1. Normal S2.      Murmurs: There is no murmur.  Edema:    Peripheral edema absent.  Abdominal:     Palpations: Abdomen is soft.  Skin:    General: Skin is warm and dry.  Neurological:     Mental Status: Alert and oriented to person, place and time.     Cranial Nerves: Cranial nerves are intact.       ASSESSMENT & PLAN:   1. Coronary artery disease involving native coronary artery of native heart without angina pectoris History of cardiac arrest in 2012.  Cardiac catheterization demonstrated totally occluded RCA.  PCI was unsuccessful.  She had mild nonobstructive disease elsewhere.  She has been managed medically.  Stress test in 2018 was low risk.  She is doing well without angina.  Continue aspirin 81 mg daily, atorvastatin 40 mg daily, metoprolol tartrate 50 mg twice daily  2. Essential hypertension Blood pressure is well controlled.  Continue HCTZ 25 mg daily, lisinopril 40 mg daily, metoprolol tartrate 50 mg twice daily.  3. Pure hypercholesterolemia LDL optimal.  Continue atorvastatin 40 mg daily.       Dispo:  Return in about 1 year (around 12/13/2021) for Routine follow up in 1 year with Dr. Tamala Chase. .   Medication Adjustments/Labs and Tests Ordered: Current medicines are reviewed at length with the patient today.  Concerns regarding medicines are outlined above.  Tests Ordered: Orders Placed This Encounter  Procedures   EKG 12-Lead   Medication Changes: No orders of the defined types were placed in this  encounter.  Signed, Richardson Dopp, PA-C  12/13/2020 2:26 PM    [facility] [address], [place], [place]  [address] Phone: [phone]; Fax: [phone]

## 2020-12-13 NOTE — Patient Instructions (Signed)
Medication Instructions:   Your physician recommends that you continue on your current medications as directed. Please refer to the Current Medication list given to you today.  *If you need a refill on your cardiac medications before your next appointment, please call your pharmacy*  Lab Work:  -NONE  If you have labs (blood work) drawn today and your tests are completely normal, you will receive your results only by: [place] (if you have MyChart) OR A paper copy in the mail If you have any lab test that is abnormal or we need to change your treatment, we will call you to review the results.   Testing/Procedures:  -NONE  Follow-Up: At [facility], you and your health needs are our priority.  As part of our continuing mission to provide you with exceptional heart care, we have created designated Provider Care Teams.  These Care Teams include your primary Cardiologist (physician) and Advanced Practice Providers (APPs -  Physician Assistants and Nurse Practitioners) who all work together to provide you with the care you need, when you need it.  We recommend signing up for the patient portal called "MyChart".  Sign up information is provided on this After Visit Summary.  MyChart is used to connect with patients for Virtual Visits (Telemedicine).  Patients are able to view lab/test results, encounter notes, upcoming appointments, etc.  Non-urgent messages can be sent to your provider as well.   To learn more about what you can do with MyChart, go to [email or web address].    Your next appointment:   1 year(s)  The format for your next appointment:   In Person  Provider:   Daneen Schick, MD   Other Instructions  Your physician wants you to follow-up in: 1 year with Dr. Tamala Julian.  You will receive a reminder letter in the mail two months in advance. If you don't receive a letter, please call our office to schedule the follow-up appointment.

## 2020-12-20 ENCOUNTER — Other Ambulatory Visit (HOSPITAL_COMMUNITY): Payer: Self-pay

## 2020-12-21 ENCOUNTER — Other Ambulatory Visit (HOSPITAL_COMMUNITY): Payer: Self-pay

## 2020-12-23 ENCOUNTER — Other Ambulatory Visit (HOSPITAL_COMMUNITY): Payer: Self-pay

## 2020-12-23 ENCOUNTER — Other Ambulatory Visit: Payer: Self-pay | Admitting: *Deleted

## 2020-12-23 MED ORDER — NITROGLYCERIN 0.4 MG SL SUBL
SUBLINGUAL_TABLET | SUBLINGUAL | 2 refills | Status: DC
  Filled 2020-12-23: qty 25, 30d supply, fill #0

## 2020-12-29 ENCOUNTER — Other Ambulatory Visit (HOSPITAL_COMMUNITY): Payer: Self-pay

## 2021-01-10 ENCOUNTER — Other Ambulatory Visit (HOSPITAL_COMMUNITY): Payer: Self-pay

## 2021-01-11 ENCOUNTER — Other Ambulatory Visit (HOSPITAL_COMMUNITY): Payer: Self-pay

## 2021-01-12 ENCOUNTER — Other Ambulatory Visit (HOSPITAL_COMMUNITY): Payer: Self-pay

## 2021-01-13 ENCOUNTER — Other Ambulatory Visit (HOSPITAL_COMMUNITY): Payer: Self-pay

## 2021-01-13 MED ORDER — HYDROCODONE-ACETAMINOPHEN 5-325 MG PO TABS
1.0000 | ORAL_TABLET | Freq: Three times a day (TID) | ORAL | 0 refills | Status: DC | PRN
  Filled 2021-01-13: qty 40, 20d supply, fill #0

## 2021-01-17 DIAGNOSIS — D692 Other nonthrombocytopenic purpura: Secondary | ICD-10-CM | POA: Diagnosis not present

## 2021-01-17 DIAGNOSIS — J449 Chronic obstructive pulmonary disease, unspecified: Secondary | ICD-10-CM | POA: Diagnosis not present

## 2021-01-17 DIAGNOSIS — I10 Essential (primary) hypertension: Secondary | ICD-10-CM | POA: Diagnosis not present

## 2021-01-20 ENCOUNTER — Other Ambulatory Visit (HOSPITAL_COMMUNITY): Payer: Self-pay

## 2021-01-23 ENCOUNTER — Other Ambulatory Visit (HOSPITAL_COMMUNITY): Payer: Self-pay

## 2021-01-23 MED ORDER — LORAZEPAM 1 MG PO TABS
1.0000 mg | ORAL_TABLET | Freq: Every evening | ORAL | 5 refills | Status: DC
  Filled 2021-01-23: qty 30, 30d supply, fill #0
  Filled 2021-02-15 – 2021-02-21 (×2): qty 30, 30d supply, fill #1
  Filled 2021-03-18: qty 30, 30d supply, fill #2
  Filled 2021-04-18: qty 30, 30d supply, fill #3
  Filled 2021-05-14: qty 30, 30d supply, fill #4
  Filled 2021-06-14: qty 30, 30d supply, fill #5

## 2021-02-09 ENCOUNTER — Other Ambulatory Visit (HOSPITAL_COMMUNITY): Payer: Self-pay

## 2021-02-16 ENCOUNTER — Other Ambulatory Visit (HOSPITAL_COMMUNITY): Payer: Self-pay

## 2021-02-16 MED ORDER — HYDROCODONE-ACETAMINOPHEN 5-325 MG PO TABS
1.0000 | ORAL_TABLET | Freq: Two times a day (BID) | ORAL | 0 refills | Status: DC | PRN
  Filled 2021-02-16: qty 20, 10d supply, fill #0

## 2021-02-17 ENCOUNTER — Other Ambulatory Visit (HOSPITAL_COMMUNITY): Payer: Self-pay

## 2021-02-21 ENCOUNTER — Other Ambulatory Visit (HOSPITAL_COMMUNITY): Payer: Self-pay

## 2021-03-01 ENCOUNTER — Ambulatory Visit: Payer: Medicare Other | Admitting: Pulmonary Disease

## 2021-03-01 ENCOUNTER — Ambulatory Visit (INDEPENDENT_AMBULATORY_CARE_PROVIDER_SITE_OTHER): Payer: Medicare Other

## 2021-03-01 ENCOUNTER — Encounter: Payer: Self-pay | Admitting: Pulmonary Disease

## 2021-03-01 ENCOUNTER — Other Ambulatory Visit: Payer: Self-pay

## 2021-03-01 ENCOUNTER — Other Ambulatory Visit (HOSPITAL_COMMUNITY): Payer: Self-pay

## 2021-03-01 VITALS — BP 120/68 | HR 95 | Temp 97.9°F | Ht 68.0 in | Wt 175.2 lb

## 2021-03-01 DIAGNOSIS — R911 Solitary pulmonary nodule: Secondary | ICD-10-CM

## 2021-03-01 DIAGNOSIS — R0602 Shortness of breath: Secondary | ICD-10-CM | POA: Diagnosis not present

## 2021-03-01 DIAGNOSIS — J449 Chronic obstructive pulmonary disease, unspecified: Secondary | ICD-10-CM | POA: Diagnosis not present

## 2021-03-01 MED ORDER — BREZTRI AEROSPHERE 160-9-4.8 MCG/ACT IN AERO
2.0000 | INHALATION_SPRAY | Freq: Two times a day (BID) | RESPIRATORY_TRACT | 3 refills | Status: DC
  Filled 2021-03-01: qty 10.7, 30d supply, fill #0
  Filled 2021-04-17: qty 10.7, 30d supply, fill #1
  Filled 2021-06-05: qty 10.7, 30d supply, fill #2
  Filled 2021-07-10: qty 10.7, 30d supply, fill #3

## 2021-03-01 NOTE — Assessment & Plan Note (Addendum)
I explained to her that she has gold stage III COPD however she has not had any exacerbations in the past 5 years and symptom burden seems to be low. She has done well on Symbicort seemingly but based on Advised she would still be better with triple therapy.  Hence we will give her a prescription for Breztri, side effects discussed in detail. She will be referred to pulmonary rehab-she prefers a home-based program

## 2021-03-01 NOTE — Patient Instructions (Addendum)
°  X Ambulatory sat  X schedule pFTs  X CXR today  X Breztri sample & Rx - 2 puffs twice daily INSTEAD of symbicort

## 2021-03-01 NOTE — Progress Notes (Signed)
Subjective:    Patient ID: Rachel Chase, female    DOB: 1940/06/07, 81 y.o.   MRN: [id number]  HPI  Chief Complaint  Patient presents with   Consult    Consult for worsening dyspnea. Pt states that her dyspnea has been occurring for 10-15 years.     81 year old heavy ex-smoker presents to establish care for COPD. She had PFTs performed 5 years ago which we reviewed but has never been formally given a diagnosis of COPD.  She however was prescribed Symbicort by her PCP which she has been taking.  She denies any exacerbations in the last 5 years.  She reports persistent and chronic shortness of breath for the past 15 to 20 years especially when walking uphill or carrying a load, this is relieved by resting.  She denies wheezing or frequent chest colds. 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  She smoked more than 60 pack years before she quit in 2000 when her husband was diagnosed with lung cancer/esophageal cancer  PMH -CAD, breast cancer, back surgery x3   Significant tests/ events reviewed  CT chest without contrast 06/2017 stable left lower lobe lobulated nodule 17 x 16 mm  PFTs 06/2016 severe obstruction, ratio 52, FEV1 0.89/35%, FVC 1.70/51% , FEV1 improved to 45%/1.15 with bronchodilator , TLC 106%, DLCO 9.2/30%   Past Medical History:  Diagnosis Date   Anxiety    Arthritis    Back pain    Lower Back pain - takes Cortisone shots   Bone spur 2009   lower back   Breast cancer ([place]) 2015   finished radiation 12/15   Bronchitis    CAD (coronary artery disease)    a. cath 12/2010 for cardiac arrest showed    Cataract    Complication of anesthesia    throat irrition from past intubations, intubation granulomas   Coronary artery disease    Dyspnea on exertion    History of cardiac arrest 12/2010   Hyperlipemia    Hypertension    MI (myocardial infarction) ([place]) 12/2010   Was  followed by Dr. 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 (followed by Lanelle Bal, MD)   Radiation 01/06/14-02/04/14   left breast    Rosacea 2008   both eyes   Past Surgical History:  Procedure Laterality Date   ANTERIOR LAT LUMBAR FUSION N/A 11/18/2018   Procedure: Lumbar One-Two Anterolateral lumbar decompression/fusion/lateral plate fixation;  Surgeon: Kristeen Miss, MD;  Location: [place];  Service: Neurosurgery;  Laterality: N/A;  Lumbar One-Two Anterolateral lumbar decompression/fusion/lateral plate fixation   BACK SURGERY     BILIARY DILATION  05/29/2019   Procedure: BILIARY DILATION;  Surgeon: Clarene Essex, MD;  Location: [facility];  Service: Endoscopy;;   BREAST BIOPSY  8/10   fibrocystic changes   BREAST LUMPECTOMY Left 2015   BREAST LUMPECTOMY WITH NEEDLE LOCALIZATION AND AXILLARY SENTINEL LYMPH NODE BX Left 11/13/2013   Procedure: NEEDLE LOCALIZATION LEFT BREAST LUMPECTOMY WITH LEFT  AXILLARY SENTINEL LYMPH NODE BX;  Surgeon: Excell Seltzer, MD;  Location: [place];  Service: General;  Laterality: Left;   CATARACT EXTRACTION     both   CHOLECYSTECTOMY N/A 05/30/2019   Procedure: LAPAROSCOPIC CHOLECYSTECTOMY;  Surgeon: Erroll Luna, MD;  Location: [facility];  Service: General;  Laterality: N/A;   CHOLECYSTECTOMY     dental implant     ERCP N/A 05/29/2019   Procedure: ENDOSCOPIC RETROGRADE CHOLANGIOPANCREATOGRAPHY (ERCP);  Surgeon: Clarene Essex, MD;  Location: [facility];  Service: Endoscopy;  Laterality: N/A;   EYE SURGERY     Laser Eye Surgery approx 2007   heart stent     failed-no stents   HYSTEROSCOPY  9/10   D&C (polyp)   LEFT HEART CATHETERIZATION WITH CORONARY ANGIOGRAM N/A 01/18/2011   Procedure: LEFT HEART CATHETERIZATION WITH CORONARY ANGIOGRAM;  Surgeon: Clent Demark, MD;  Location: [facility];  Service: Cardiovascular;  Laterality: N/A;    PERCUTANEOUS CORONARY STENT INTERVENTION (PCI-S) N/A 01/18/2011   Procedure: PERCUTANEOUS CORONARY STENT INTERVENTION (PCI-S);  Surgeon: Clent Demark, MD;  Location: [facility];  Service: Cardiovascular;  Laterality: N/A;   REMOVAL OF STONES  05/29/2019   Procedure: REMOVAL OF STONES;  Surgeon: Clarene Essex, MD;  Location: [facility];  Service: Endoscopy;;   SPHINCTEROTOMY  05/29/2019   Procedure: [name];  Surgeon: Clarene Essex, MD;  Location: [facility];  Service: Endoscopy;;   SPINAL FUSION     TONSILLECTOMY AND ADENOIDECTOMY     TUBAL LIGATION      Allergies  Allergen Reactions   Dilaudid [Hydromorphone Hcl] Nausea And Vomiting   Penicillins Rash    Social History   Socioeconomic History   Marital status: Widowed    Spouse name: Not on file   Number of children: 3   Years of education: Not on file   Highest education level: Not on file  Occupational History   Not on file  Tobacco Use   Smoking status: Former    Packs/day: 1.50    Years: 45.00    Pack years: 67.50    Types: Cigarettes    Quit date: 04/17/1998    Years since quitting: 22.8   Smokeless tobacco: Never   Tobacco comments:    nicorette gum  Vaping Use   Vaping Use: Never used  Substance and Sexual Activity   Alcohol use: Yes    Comment: 1-2 a month   Drug use: No   Sexual activity: Not Currently    Partners: Male    Birth control/protection: Post-menopausal  Other Topics Concern   Not on file  Social History Narrative   Not on file   Social Determinants of Health   Financial Resource Strain: Not on file  Food Insecurity: Not on file  Transportation Needs: Not on file  Physical Activity: Not on file  Stress: Not on file  Social Connections: Not on file  Intimate Partner Violence: Not on file      Family History  Problem Relation Age of Onset   Congestive Heart Failure Father    CVA Father        MI, CAD   Coronary artery disease Father    Valvular heart disease Mother     Hypertension Mother    Rectal cancer Sister    Breast cancer Sister    Breast cancer Maternal Grandmother 44   Breast cancer Maternal Aunt    Cancer Sister       Review of Systems Constitutional: negative for anorexia, fevers and sweats  Eyes: negative for irritation, redness and visual disturbance  Ears, nose, mouth, throat, and face: negative for earaches, epistaxis, nasal congestion and sore throat  Respiratory: negative for cough, sputum and wheezing  Cardiovascular: negative for chest pain,  lower extremity edema, orthopnea, palpitations and syncope  Gastrointestinal: negative  for abdominal pain, constipation, diarrhea, melena, nausea and vomiting  Genitourinary:negative for dysuria, frequency and hematuria  Hematologic/lymphatic: negative for bleeding, easy bruising and lymphadenopathy  Musculoskeletal:negative for arthralgias, muscle weakness and stiff joints  Neurological: negative for coordination problems, gait problems, headaches and weakness  Endocrine: negative for diabetic symptoms including polydipsia, polyuria and weight loss     Objective:   Physical Exam  Gen. Pleasant, well-nourished, elderly, in no distress, normal affect ENT - no pallor,icterus, no post nasal drip Neck: No JVD, no thyromegaly, no carotid bruits Lungs: no use of accessory muscles, no dullness to percussion, decreased bilateral without rales or rhonchi  Cardiovascular: Rhythm regular, heart sounds  normal, no murmurs or gallops, no peripheral edema Abdomen: soft and non-tender, no hepatosplenomegaly, BS normal. Musculoskeletal: No deformities, no cyanosis or clubbing Neuro:  alert, non focal       Assessment & Plan:

## 2021-03-01 NOTE — Assessment & Plan Note (Signed)
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

## 2021-03-08 ENCOUNTER — Encounter: Payer: Self-pay | Admitting: *Deleted

## 2021-03-13 ENCOUNTER — Other Ambulatory Visit (HOSPITAL_COMMUNITY): Payer: Self-pay

## 2021-03-14 ENCOUNTER — Other Ambulatory Visit (HOSPITAL_COMMUNITY): Payer: Self-pay

## 2021-03-15 ENCOUNTER — Other Ambulatory Visit (HOSPITAL_COMMUNITY): Payer: Self-pay

## 2021-03-15 MED ORDER — HYDROCODONE-ACETAMINOPHEN 5-325 MG PO TABS
1.0000 | ORAL_TABLET | ORAL | 0 refills | Status: DC
  Filled 2021-03-15: qty 40, 20d supply, fill #0
  Filled 2021-03-15: qty 40, 14d supply, fill #0

## 2021-03-16 ENCOUNTER — Other Ambulatory Visit (HOSPITAL_COMMUNITY): Payer: Self-pay

## 2021-03-18 ENCOUNTER — Other Ambulatory Visit (HOSPITAL_COMMUNITY): Payer: Self-pay

## 2021-03-20 ENCOUNTER — Other Ambulatory Visit (HOSPITAL_COMMUNITY): Payer: Self-pay

## 2021-03-21 ENCOUNTER — Other Ambulatory Visit: Payer: Self-pay | Admitting: Geriatric Medicine

## 2021-03-21 DIAGNOSIS — Z1231 Encounter for screening mammogram for malignant neoplasm of breast: Secondary | ICD-10-CM

## 2021-04-05 ENCOUNTER — Ambulatory Visit
Admission: RE | Admit: 2021-04-05 | Discharge: 2021-04-05 | Disposition: A | Discharge status: Home / Self Care | Payer: Medicare Other | Source: Ambulatory Visit | Attending: Geriatric Medicine | Admitting: Geriatric Medicine

## 2021-04-05 ENCOUNTER — Other Ambulatory Visit: Payer: Self-pay

## 2021-04-05 DIAGNOSIS — Z1231 Encounter for screening mammogram for malignant neoplasm of breast: Secondary | ICD-10-CM

## 2021-04-18 ENCOUNTER — Other Ambulatory Visit (HOSPITAL_COMMUNITY): Payer: Self-pay

## 2021-04-19 ENCOUNTER — Other Ambulatory Visit (HOSPITAL_COMMUNITY): Payer: Self-pay

## 2021-04-19 MED ORDER — HYDROCODONE-ACETAMINOPHEN 5-325 MG PO TABS
1.0000 | ORAL_TABLET | Freq: Two times a day (BID) | ORAL | 0 refills | Status: DC | PRN
Start: 2021-04-19 — End: 2021-12-05
  Filled 2021-04-19: qty 40, 20d supply, fill #0

## 2021-04-24 ENCOUNTER — Other Ambulatory Visit (HOSPITAL_COMMUNITY): Payer: Self-pay

## 2021-04-25 ENCOUNTER — Other Ambulatory Visit (HOSPITAL_COMMUNITY): Payer: Self-pay

## 2021-05-15 ENCOUNTER — Other Ambulatory Visit (HOSPITAL_COMMUNITY): Payer: Self-pay

## 2021-05-17 ENCOUNTER — Other Ambulatory Visit (HOSPITAL_COMMUNITY): Payer: Self-pay

## 2021-05-22 ENCOUNTER — Other Ambulatory Visit (HOSPITAL_COMMUNITY): Payer: Self-pay

## 2021-05-30 ENCOUNTER — Other Ambulatory Visit: Payer: Self-pay | Admitting: *Deleted

## 2021-05-30 DIAGNOSIS — J449 Chronic obstructive pulmonary disease, unspecified: Secondary | ICD-10-CM

## 2021-05-31 ENCOUNTER — Ambulatory Visit: Payer: Medicare Other | Admitting: Pulmonary Disease

## 2021-05-31 ENCOUNTER — Encounter: Payer: Self-pay | Admitting: Pulmonary Disease

## 2021-05-31 ENCOUNTER — Ambulatory Visit (INDEPENDENT_AMBULATORY_CARE_PROVIDER_SITE_OTHER): Payer: Medicare Other | Admitting: Pulmonary Disease

## 2021-05-31 ENCOUNTER — Other Ambulatory Visit (HOSPITAL_COMMUNITY): Payer: Self-pay

## 2021-05-31 VITALS — BP 118/62 | HR 64 | Temp 98.4°F | Ht 68.5 in | Wt 178.0 lb

## 2021-05-31 DIAGNOSIS — J449 Chronic obstructive pulmonary disease, unspecified: Secondary | ICD-10-CM

## 2021-05-31 DIAGNOSIS — R911 Solitary pulmonary nodule: Secondary | ICD-10-CM

## 2021-05-31 LAB — PULMONARY FUNCTION TEST
DL/VA % pred: 77 %
DL/VA: 3.05 ml/min/mmHg/L
DLCO cor % pred: 56 %
DLCO cor: 12.3 ml/min/mmHg
DLCO unc % pred: 56 %
DLCO unc: 12.3 ml/min/mmHg
FEF 25-75 Post: 0.58 L/sec
FEF 25-75 Pre: 0.44 L/sec
FEF2575-%Change-Post: 32 %
FEF2575-%Pred-Post: 35 %
FEF2575-%Pred-Pre: 27 %
FEV1-%Change-Post: 11 %
FEV1-%Pred-Post: 48 %
FEV1-%Pred-Pre: 43 %
FEV1-Post: 1.14 L
FEV1-Pre: 1.03 L
FEV1FVC-%Change-Post: 0 %
FEV1FVC-%Pred-Pre: 75 %
FEV6-%Change-Post: 10 %
FEV6-%Pred-Post: 68 %
FEV6-%Pred-Pre: 61 %
FEV6-Post: 2.03 L
FEV6-Pre: 1.83 L
FEV6FVC-%Change-Post: 0 %
FEV6FVC-%Pred-Post: 102 %
FEV6FVC-%Pred-Pre: 103 %
FVC-%Change-Post: 11 %
FVC-%Pred-Post: 66 %
FVC-%Pred-Pre: 59 %
FVC-Post: 2.08 L
FVC-Pre: 1.86 L
Post FEV1/FVC ratio: 55 %
Post FEV6/FVC ratio: 98 %
Pre FEV1/FVC ratio: 55 %
Pre FEV6/FVC Ratio: 98 %
RV % pred: 174 %
RV: 4.6 L
TLC % pred: 115 %
TLC: 6.61 L

## 2021-05-31 MED ORDER — AEROCHAMBER MV MISC
0 refills | Status: AC
  Filled 2021-05-31: qty 1, 1d supply, fill #0

## 2021-05-31 NOTE — Assessment & Plan Note (Addendum)
We discussed that PFTs are stable over a 5-year.Marland Kitchen ?We discussed natural course of COPD.  We discussed action plan for COPD and signs and symptoms of a COPD flare. ?She will continue on Breztri and discontinue Symbicort.  Albuterol MDI can be used for rescue ? ?I have asked her to increase steps to 2200 daily.  She is hesitant to join a center-based pulmonary rehab program.  Prescription for spacer will be provided to improve MDI drug delivery ?

## 2021-05-31 NOTE — Patient Instructions (Addendum)
?  Stay on breztri 2 puffs twice daily  ? ?X spacer ?

## 2021-05-31 NOTE — Assessment & Plan Note (Signed)
Noted to be hematoma, no follow-up required.  Stable on chest x-ray ?

## 2021-05-31 NOTE — Patient Instructions (Signed)
Full PFT performed today. °

## 2021-05-31 NOTE — Progress Notes (Signed)
Full PFT performed today. °

## 2021-05-31 NOTE — Progress Notes (Signed)
? ?  Subjective:  ? ? Patient ID: Rachel Chase, female    DOB: 07-14-1940, 81 y.o.   MRN: [id number] ? ?HPI ? ?81 yo heavy ex-smoker  for  FU of COPD ? 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  ?She smoked more than 60 pack years before she quit in 2000 when her husband was diagnosed with lung cancer/esophageal cancer ?  ?PMH -CAD, breast cancer, back surgery x3 ? ?Chief Complaint  ?Patient presents with  ? Follow-up  ?  Pt has questions about symbicort and the breztri. Pt is here for follow up for her copd. Pt had a full PFT today.   ? ?35-monthfollow-up visit. ?We reviewed her PFTs ?She has used [facility], academicand has questions about inhaler.  MDI technique is not great .  She has several questions about course of COPD and what to expect in the future. ?She shows me her [employer] steps record, trending about 2000 steps daily over the last 2 months ? ?Significant tests/ events reviewed ? ?CT chest without contrast 06/2017 stable left lower lobe lobulated nodule 17 x 16 mm ? ?PFTs 05/2021, moderate to severe obstruction, ratio 55, FEV1 43%, no bronchodilator response, DLCO 12.3/56%, TLC 115% ? ?PFTs 06/2016 severe obstruction, ratio 52, FEV1 0.89/35%, FVC 1.70/51% , FEV1 improved to 45%/1.15 with bronchodilator , TLC 106%, DLCO 9.2/30% ? ? ?Review of Systems ?neg for any significant sore throat, dysphagia, itching, sneezing, nasal congestion or excess/ purulent secretions, fever, chills, sweats, unintended wt loss, pleuritic or exertional cp, hempoptysis, orthopnea pnd or change in chronic leg swelling. Also denies presyncope, palpitations, heartburn, abdominal pain, nausea, vomiting, diarrhea or change in bowel or urinary habits, dysuria,hematuria, rash, arthralgias, visual complaints, headache, numbness weakness or ataxia. ? ?   ?Objective:  ? Physical Exam ? ?Gen. Pleasant, well-nourished, in no distress ?ENT - no thrush, no pallor/icterus,no  post nasal drip ?Neck: No JVD, no thyromegaly, no carotid bruits ?Lungs: no use of accessory muscles, no dullness to percussion, clear without rales or rhonchi  ?Cardiovascular: Rhythm regular, heart sounds  normal, no murmurs or gallops, no peripheral edema ?Musculoskeletal: No deformities, no cyanosis or clubbing  ? ? ? ?   ?Assessment & Plan:  ? ? ?

## 2021-06-06 ENCOUNTER — Other Ambulatory Visit (HOSPITAL_COMMUNITY): Payer: Self-pay

## 2021-06-06 MED ORDER — HYDROCODONE-ACETAMINOPHEN 5-325 MG PO TABS
1.0000 | ORAL_TABLET | ORAL | 0 refills | Status: DC
Start: 2021-06-06 — End: 2021-07-12
  Filled 2021-06-06: qty 40, 20d supply, fill #0

## 2021-06-08 ENCOUNTER — Other Ambulatory Visit (HOSPITAL_COMMUNITY): Payer: Self-pay

## 2021-06-15 ENCOUNTER — Other Ambulatory Visit (HOSPITAL_COMMUNITY): Payer: Self-pay

## 2021-07-11 ENCOUNTER — Other Ambulatory Visit (HOSPITAL_COMMUNITY): Payer: Self-pay

## 2021-07-12 ENCOUNTER — Other Ambulatory Visit (HOSPITAL_COMMUNITY): Payer: Self-pay

## 2021-07-12 MED ORDER — HYDROCODONE-ACETAMINOPHEN 5-325 MG PO TABS
1.0000 | ORAL_TABLET | Freq: Every day | ORAL | 0 refills | Status: DC | PRN
  Filled 2021-07-12: qty 40, 30d supply, fill #0
  Filled 2021-07-12: qty 40, 26d supply, fill #0

## 2021-07-12 MED ORDER — LORAZEPAM 1 MG PO TABS
1.0000 mg | ORAL_TABLET | Freq: Every day | ORAL | 5 refills | Status: DC
  Filled 2021-07-12 – 2021-07-14 (×3): qty 30, 30d supply, fill #0
  Filled 2021-08-13: qty 30, 30d supply, fill #1
  Filled 2021-09-12: qty 30, 30d supply, fill #2
  Filled 2021-10-12: qty 30, 30d supply, fill #3
  Filled 2021-11-13: qty 30, 30d supply, fill #4
  Filled 2021-12-17: qty 30, 30d supply, fill #5

## 2021-07-12 MED ORDER — HYDROCODONE-ACETAMINOPHEN 5-325 MG PO TABS
1.0000 | ORAL_TABLET | Freq: Two times a day (BID) | ORAL | 0 refills | Status: DC
  Filled 2021-07-12: qty 40, 20d supply, fill #0

## 2021-07-13 ENCOUNTER — Other Ambulatory Visit (HOSPITAL_COMMUNITY): Payer: Self-pay

## 2021-07-14 ENCOUNTER — Other Ambulatory Visit (HOSPITAL_COMMUNITY): Payer: Self-pay

## 2021-07-21 ENCOUNTER — Other Ambulatory Visit (HOSPITAL_COMMUNITY): Payer: Self-pay

## 2021-07-25 ENCOUNTER — Other Ambulatory Visit (HOSPITAL_COMMUNITY): Payer: Self-pay

## 2021-07-27 ENCOUNTER — Other Ambulatory Visit (HOSPITAL_COMMUNITY): Payer: Self-pay

## 2021-07-31 ENCOUNTER — Other Ambulatory Visit (HOSPITAL_COMMUNITY): Payer: Self-pay

## 2021-08-01 ENCOUNTER — Other Ambulatory Visit: Payer: Self-pay

## 2021-08-01 ENCOUNTER — Other Ambulatory Visit (HOSPITAL_COMMUNITY): Payer: Self-pay

## 2021-08-01 DIAGNOSIS — D692 Other nonthrombocytopenic purpura: Secondary | ICD-10-CM | POA: Diagnosis not present

## 2021-08-01 DIAGNOSIS — G894 Chronic pain syndrome: Secondary | ICD-10-CM | POA: Diagnosis not present

## 2021-08-01 DIAGNOSIS — Z79899 Other long term (current) drug therapy: Secondary | ICD-10-CM | POA: Diagnosis not present

## 2021-08-01 DIAGNOSIS — J449 Chronic obstructive pulmonary disease, unspecified: Secondary | ICD-10-CM | POA: Diagnosis not present

## 2021-08-01 DIAGNOSIS — Z0000 Encounter for general adult medical examination without abnormal findings: Secondary | ICD-10-CM | POA: Diagnosis not present

## 2021-08-01 DIAGNOSIS — I10 Essential (primary) hypertension: Secondary | ICD-10-CM | POA: Diagnosis not present

## 2021-08-01 DIAGNOSIS — E7800 Pure hypercholesterolemia, unspecified: Secondary | ICD-10-CM | POA: Diagnosis not present

## 2021-08-01 MED ORDER — MYRBETRIQ 25 MG PO TB24
25.0000 mg | ORAL_TABLET | Freq: Every day | ORAL | 5 refills | Status: DC
  Filled 2021-08-01: qty 30, 30d supply, fill #0
  Filled 2021-08-22: qty 30, 30d supply, fill #1
  Filled 2021-09-26: qty 30, 30d supply, fill #2
  Filled 2021-10-18 – 2021-10-25 (×2): qty 30, 30d supply, fill #3
  Filled 2021-11-21: qty 30, 30d supply, fill #4
  Filled 2021-12-25: qty 30, 30d supply, fill #5

## 2021-08-03 ENCOUNTER — Other Ambulatory Visit (HOSPITAL_COMMUNITY): Payer: Self-pay

## 2021-08-10 ENCOUNTER — Other Ambulatory Visit (HOSPITAL_COMMUNITY): Payer: Self-pay

## 2021-08-10 MED ORDER — HYDROCHLOROTHIAZIDE 25 MG PO TABS
25.0000 mg | ORAL_TABLET | Freq: Every morning | ORAL | 3 refills | Status: DC
  Filled 2021-08-10: qty 90, 90d supply, fill #0
  Filled 2021-11-09: qty 90, 90d supply, fill #1
  Filled 2022-02-14: qty 90, 90d supply, fill #2
  Filled 2022-05-13: qty 90, 90d supply, fill #3

## 2021-08-10 MED ORDER — LISINOPRIL 40 MG PO TABS
40.0000 mg | ORAL_TABLET | Freq: Every day | ORAL | 3 refills | Status: DC
  Filled 2021-08-10: qty 90, 90d supply, fill #0
  Filled 2021-11-09: qty 90, 90d supply, fill #1
  Filled 2022-02-14: qty 90, 90d supply, fill #2
  Filled 2022-05-22: qty 90, 90d supply, fill #3

## 2021-08-10 MED ORDER — METOPROLOL TARTRATE 50 MG PO TABS
50.0000 mg | ORAL_TABLET | Freq: Two times a day (BID) | ORAL | 3 refills | Status: DC
Start: 2021-08-10 — End: 2022-08-16
  Filled 2021-08-10: qty 180, 90d supply, fill #0
  Filled 2021-11-21: qty 180, 90d supply, fill #1
  Filled 2022-02-14: qty 180, 90d supply, fill #2
  Filled 2022-05-22: qty 180, 90d supply, fill #3

## 2021-08-13 ENCOUNTER — Other Ambulatory Visit: Payer: Self-pay | Admitting: Pulmonary Disease

## 2021-08-14 ENCOUNTER — Other Ambulatory Visit (HOSPITAL_COMMUNITY): Payer: Self-pay

## 2021-08-15 ENCOUNTER — Other Ambulatory Visit (HOSPITAL_COMMUNITY): Payer: Self-pay

## 2021-08-15 MED ORDER — BREZTRI AEROSPHERE 160-9-4.8 MCG/ACT IN AERO
2.0000 | INHALATION_SPRAY | Freq: Two times a day (BID) | RESPIRATORY_TRACT | 3 refills | Status: DC
  Filled 2021-08-15: qty 10.7, 30d supply, fill #0
  Filled 2021-09-12 – 2022-01-16 (×5): qty 10.7, 30d supply, fill #1

## 2021-08-16 ENCOUNTER — Other Ambulatory Visit (HOSPITAL_COMMUNITY): Payer: Self-pay

## 2021-08-22 ENCOUNTER — Other Ambulatory Visit (HOSPITAL_COMMUNITY): Payer: Self-pay

## 2021-08-24 ENCOUNTER — Other Ambulatory Visit (HOSPITAL_COMMUNITY): Payer: Self-pay

## 2021-08-30 ENCOUNTER — Other Ambulatory Visit (HOSPITAL_COMMUNITY): Payer: Self-pay

## 2021-08-30 MED ORDER — HYDROCODONE-ACETAMINOPHEN 5-325 MG PO TABS
1.0000 | ORAL_TABLET | Freq: Every day | ORAL | 0 refills | Status: DC | PRN
  Filled 2021-08-30: qty 40, 30d supply, fill #0

## 2021-08-31 DIAGNOSIS — Z79899 Other long term (current) drug therapy: Secondary | ICD-10-CM | POA: Diagnosis not present

## 2021-09-13 ENCOUNTER — Other Ambulatory Visit (HOSPITAL_COMMUNITY): Payer: Self-pay

## 2021-09-26 ENCOUNTER — Other Ambulatory Visit (HOSPITAL_COMMUNITY): Payer: Self-pay

## 2021-09-27 ENCOUNTER — Telehealth: Payer: Self-pay | Admitting: Pulmonary Disease

## 2021-09-27 NOTE — Telephone Encounter (Signed)
Patient called and stated she was in the donut hole and is looking for cheaper options than Breo.   Can we run a ticket to see what is possibly cheaper for her

## 2021-09-29 ENCOUNTER — Other Ambulatory Visit (HOSPITAL_COMMUNITY): Payer: Self-pay

## 2021-09-29 NOTE — Telephone Encounter (Signed)
Patient called and she states that her Memory Dance was too expensive and that she can not afford it. So I reached out to pharmacy team to see what is covered sir.  Good Morning! I was able to run test claims on other inhalers in that class and they are as follows:   Advair (brand): $108.58  Symbicort (brand): $104.51  Dulera: $91.72  Advair Diskus (brand): $63.50   Anything not listed is not covered. Patient is in donut hole.    Please advise sir on patients medication!

## 2021-10-02 ENCOUNTER — Other Ambulatory Visit (HOSPITAL_COMMUNITY): Payer: Self-pay

## 2021-10-02 MED ORDER — FLUTICASONE-SALMETEROL 250-50 MCG/ACT IN AEPB
1.0000 | INHALATION_SPRAY | Freq: Two times a day (BID) | RESPIRATORY_TRACT | 11 refills | Status: DC
  Filled 2021-10-02 (×2): qty 60, 30d supply, fill #0

## 2021-10-02 NOTE — Telephone Encounter (Signed)
Called patient and left voicemail that she will have new inhaler called in due the cost of her Breo. Advised patient that if she had any concerns to call office. Nothing further needed

## 2021-10-03 ENCOUNTER — Other Ambulatory Visit (HOSPITAL_COMMUNITY): Payer: Self-pay

## 2021-10-12 ENCOUNTER — Other Ambulatory Visit (HOSPITAL_COMMUNITY): Payer: Self-pay

## 2021-10-12 MED ORDER — HYDROCODONE-ACETAMINOPHEN 5-325 MG PO TABS
1.0000 | ORAL_TABLET | Freq: Every day | ORAL | 0 refills | Status: DC | PRN
  Filled 2021-10-12: qty 40, 30d supply, fill #0

## 2021-10-16 ENCOUNTER — Other Ambulatory Visit (HOSPITAL_COMMUNITY): Payer: Self-pay

## 2021-10-18 ENCOUNTER — Other Ambulatory Visit (HOSPITAL_COMMUNITY): Payer: Self-pay

## 2021-10-23 ENCOUNTER — Telehealth: Payer: Self-pay | Admitting: Pulmonary Disease

## 2021-10-24 NOTE — Telephone Encounter (Signed)
AZ&ME forms were faxed to [facility] office. Filled out and faxed back to [facility] office for Dr. Elsworth Soho to sign.

## 2021-10-25 ENCOUNTER — Other Ambulatory Visit (HOSPITAL_COMMUNITY): Payer: Self-pay

## 2021-10-31 ENCOUNTER — Other Ambulatory Visit (HOSPITAL_COMMUNITY): Payer: Self-pay

## 2021-11-06 ENCOUNTER — Other Ambulatory Visit: Payer: Self-pay

## 2021-11-06 MED ORDER — BREZTRI AEROSPHERE 160-9-4.8 MCG/ACT IN AERO: 2.0000 | INHALATION_SPRAY | Freq: Two times a day (BID) | RESPIRATORY_TRACT | 11 refills | Status: DC

## 2021-11-06 NOTE — Telephone Encounter (Signed)
Forwarding message to [place] since she covers his box. Do you know if this has been taken care of?  Thank you.

## 2021-11-09 ENCOUNTER — Other Ambulatory Visit (HOSPITAL_COMMUNITY): Payer: Self-pay

## 2021-11-10 ENCOUNTER — Other Ambulatory Visit (HOSPITAL_COMMUNITY): Payer: Self-pay

## 2021-11-10 MED ORDER — ATORVASTATIN CALCIUM 40 MG PO TABS
40.0000 mg | ORAL_TABLET | Freq: Every day | ORAL | 2 refills | Status: DC
  Filled 2021-11-10: qty 90, 90d supply, fill #0
  Filled 2022-03-03: qty 90, 90d supply, fill #1
  Filled 2022-05-27: qty 90, 90d supply, fill #2

## 2021-11-14 ENCOUNTER — Other Ambulatory Visit (HOSPITAL_COMMUNITY): Payer: Self-pay

## 2021-11-21 ENCOUNTER — Other Ambulatory Visit (HOSPITAL_COMMUNITY): Payer: Self-pay

## 2021-11-22 ENCOUNTER — Other Ambulatory Visit (HOSPITAL_COMMUNITY): Payer: Self-pay

## 2021-11-27 ENCOUNTER — Other Ambulatory Visit (HOSPITAL_COMMUNITY): Payer: Self-pay

## 2021-11-28 ENCOUNTER — Other Ambulatory Visit (HOSPITAL_COMMUNITY): Payer: Self-pay

## 2021-12-01 ENCOUNTER — Other Ambulatory Visit (HOSPITAL_COMMUNITY): Payer: Self-pay

## 2021-12-01 DIAGNOSIS — Z780 Asymptomatic menopausal state: Secondary | ICD-10-CM | POA: Diagnosis not present

## 2021-12-01 DIAGNOSIS — G894 Chronic pain syndrome: Secondary | ICD-10-CM | POA: Diagnosis not present

## 2021-12-01 MED ORDER — HYDROCODONE-ACETAMINOPHEN 5-325 MG PO TABS
1.0000 | ORAL_TABLET | Freq: Every day | ORAL | 0 refills | Status: DC | PRN
  Filled 2021-12-01: qty 40, 30d supply, fill #0

## 2021-12-05 ENCOUNTER — Ambulatory Visit (INDEPENDENT_AMBULATORY_CARE_PROVIDER_SITE_OTHER): Payer: Medicare Other | Admitting: Nurse Practitioner

## 2021-12-05 ENCOUNTER — Other Ambulatory Visit: Payer: Self-pay | Admitting: Internal Medicine

## 2021-12-05 ENCOUNTER — Encounter: Payer: Self-pay | Admitting: Nurse Practitioner

## 2021-12-05 DIAGNOSIS — J439 Emphysema, unspecified: Secondary | ICD-10-CM

## 2021-12-05 DIAGNOSIS — J4489 Other specified chronic obstructive pulmonary disease: Secondary | ICD-10-CM

## 2021-12-05 DIAGNOSIS — R490 Dysphonia: Secondary | ICD-10-CM | POA: Diagnosis not present

## 2021-12-05 DIAGNOSIS — Z780 Asymptomatic menopausal state: Secondary | ICD-10-CM

## 2021-12-05 NOTE — Assessment & Plan Note (Signed)
Severe obstructive lung disease that has been stable for around the past 5 years.  She has not had any exacerbations requiring steroids or attics.  She is currently maintained on Breztri.  She is having some issues with hoarseness that she feels like is related to her inhaler.  She currently gets it for free so does not want to change inhaler regimens.  She is already using it with a spacer.  She would like to try decreasing it to 1 puff twice a day since her breathing is so stable.  Advised her that this is not the recommended dose but we can try to try it.  She will need to increase back to 2 puffs twice a day if she has any issues with her breathing or new respiratory symptoms.  Encouraged her to remain as active as possible.  Patient Instructions  Ok to try Breztri 1 puff Twice daily. Brush tongue and rinse mouth afterwards. Use with spacer. Aware that this is not the recommended dosage and increase back to 2 puffs Twice daily if you notice any trouble with your breathing or increased coughing  Continue Albuterol inhaler 2 puffs or 3 mL neb every 6 hours as needed for shortness of breath or wheezing. Notify if symptoms persist despite rescue inhaler/neb use.   Use sugar free hard candies or non-menthol cough drops during this time to soothe your throat.  Warm tea with honey and lemon. Rest your voice 2-3 days prior to your play, if possible.   Follow up in 6 months with Dr. Elsworth Soho or sooner if needed

## 2021-12-05 NOTE — Assessment & Plan Note (Signed)
Hoarse voice quality related to ICS. See above.

## 2021-12-05 NOTE — Progress Notes (Signed)
$'@Patient'b$  ID: Rachel Chase, female    DOB: 16-Jan-1941, 81 y.o.   MRN: [id number]  Chief Complaint  Patient presents with   Follow-up    No concerns.  Doing well.  Using [place], working well.    Referring provider: Lajean Manes, MD  HPI: 81 year old female, former smoker followed for COPD with chronic bronchitis and emphysema and solitary lung nodule.  She is a patient of Dr. Bari Mantis and last seen in office 05/31/2021.  Past medical history significant for CAD, hypertension, spinal stenosis, primary cancer of left breast stage Ia, HLD.  TEST/EVENTS:  06/2016 PFTs: Severe obstruction with ratio 52, FEV1 35%, FVC 51%.  Did have bronchodilator response of 10%.  TLC 106%, DLCO 30% 07/15/2017 CT chest without contrast: Stable left lower lobe lobulated nodule 17 x 16 mm 05/2021 PFTs: Moderate to severe obstruction with ratio 55, FEV1 43%, no BD, DLCO 56%, TLC 115%  05/31/2021: OV with Dr. Elsworth Soho.  PFTs are stable over a 5-year course.  Discussed action plan for COPD and signs and symptoms of a COPD flare.  She has not had any exacerbations recently.  She is on [employer].  Encouraged her to increase activity as tolerated.  Previous lung nodule was noted to be a hematoma and no follow-up required.  Stable on chest x-ray.  12/05/2021: Today-follow-up Patient presents today for follow-up of her COPD.  She has been doing quite well since she was here last.  She has not had any exacerbations requiring prednisone or steroids.  She is maintained on [place] which she likes.  Rare use of albuterol.  She has noticed that she struggles with some voice hoarseness that waxes and wanes from day-to-day.  She does feel like the Judithann Sauger is the cause of this.  She wants to know if she can try 1 puff twice a day instead of 2 puffs twice a day.  She does not want to change inhalers as she is currently getting it free from [facility].  She does use it with a spacer.  No difficulties swallowing, nasal congestion/postnasal drip,  reflux symptoms, or significant cough.  Allergies  Allergen Reactions   Dilaudid [Hydromorphone Hcl] Nausea And Vomiting   Penicillins Rash    Immunization History  Administered Date(s) Administered   Fluad Quad(high Dose 65+) 11/29/2021   Influenza Split 12/05/2020   Influenza-Unspecified 12/09/2014   Moderna SARS-COV2 Booster Vaccination 12/22/2019, 06/23/2020   Moderna Sars-Covid-2 Vaccination 02/24/2019, 03/24/2019, 12/22/2019, 06/15/2020   Pfizer Covid-19 Vaccine Bivalent Booster 64yr & up 11/09/2020, 01/09/2021   Pneumococcal Conjugate-13 03/30/2013   Pneumococcal Polysaccharide-23 12/29/2001, 03/09/2009   Td 02/27/2004   Tdap 05/16/2016   Zoster Recombinat (Shingrix) 10/11/2017, 01/08/2018   Zoster, Live 01/23/2006    Past Medical History:  Diagnosis Date   Anxiety    Arthritis    Back pain    Lower Back pain - takes Cortisone shots   Bone spur 2009   lower back   Breast cancer ([place]   finished radiation 12/15   Bronchitis    CAD (coronary artery disease)    a. cath 12/2010 for cardiac arrest showed    Cataract    Complication of anesthesia    throat irrition from past intubations, intubation granulomas   Coronary artery disease    Dyspnea on exertion    History of cardiac arrest 12/2010   Hyperlipemia    Hypertension    [place]   Was followed by Dr. 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     atorvastatin (LIPITOR) 40 MG tablet Take 1 tablet (40 mg total) by mouth daily. 90 tablet 2   Budeson-Glycopyrrol-Formoterol (BREZTRI AEROSPHERE) 160-9-4.8 MCG/ACT AERO Inhale 2 puffs into the lungs in the morning and at bedtime. 10.7 g 11   hydrochlorothiazide (HYDRODIURIL) 25 MG tablet Take 1 tablet (25 mg total) by mouth in the morning. 90 tablet 3   HYDROcodone-acetaminophen (NORCO/VICODIN) 5-325 MG tablet Take 1-1.5 tablets by mouth daily as needed. 40 tablet 0   lisinopril (ZESTRIL) 40 MG tablet Take 1 tablet (40 mg total) by mouth daily. 90 tablet 3   LORazepam (ATIVAN) 1 MG tablet Take 1 tablet (1 mg total) by mouth at bedtime. 30 tablet 5   metoprolol tartrate (LOPRESSOR) 50 MG tablet Take 1 tablet (50 mg total) by mouth 2 (two) times daily. 180 tablet 3   mirabegron ER (MYRBETRIQ) 25 MG TB24 tablet Take 1 tablet (25 mg total) by mouth daily. 30 tablet 5   Multiple Vitamins-Minerals (PRESERVISION AREDS 2 PO) Take 1 tablet by mouth in the morning and at bedtime.     nitroGLYCERIN (NITROSTAT) 0.4 MG SL tablet DISSOLVE ONE TABLET UNDER THE TONGUE AS NEEDED FOR CHEST PAIN AS DIRECTED 25 tablet 2   Spacer/Aero-Holding Chambers (AEROCHAMBER MV) inhaler Use as instructed 1 each 0   Vitamin D, Ergocalciferol, (DRISDOL) 1.25 MG (50000 UNIT) CAPS capsule Take 1 capsule (50,000 Units total) by mouth every 14 (fourteen) days. 6 capsule 3   fluticasone-salmeterol (ADVAIR) 250-50 MCG/ACT AEPB Inhale 1 puff into the lungs every 12 (twelve) hours. 60 each 11   HYDROcodone-acetaminophen (NORCO/VICODIN) 5-325 MG tablet Take 1 tablet by mouth 2 (two) times daily every 8-12 hours as needed for pain. 40 tablet 0   HYDROcodone-acetaminophen (NORCO/VICODIN) 5-325 MG tablet Take 1 tablet by mouth every 8-12 hours as needed for pain 40 tablet 0    HYDROcodone-acetaminophen (NORCO/VICODIN) 5-325 MG tablet Take 1 tablet by mouth 2 (two) times daily every 8-12 hours as needed for pain 40 tablet 0   No facility-administered medications prior to visit.     Review of Systems:   Constitutional: No weight loss or gain, night sweats, fevers, chills, fatigue, or lassitude. HEENT: No headaches, difficulty swallowing, tooth/dental problems, or sore throat. No sneezing, itching, ear ache, nasal congestion, or post nasal drip. + Hoarse voice CV:  No chest pain, orthopnea, PND, swelling in lower extremities, anasarca, dizziness, palpitations, syncope Resp: No increased shortness of breath with exertion or at rest. No excess mucus or change in color of mucus. No productive or non-productive. No hemoptysis. No wheezing.  No chest wall deformity GI:  No heartburn, indigestion, abdominal pain, nausea, vomiting, diarrhea, change in bowel habits, loss of  appetite, bloody stools.  Neuro: No dizziness or lightheadedness.  Psych: No depression or anxiety. Mood stable.     Physical Exam:  BP 134/68 (BP Location: Right Arm, Patient Position: Sitting, Cuff Size: Large)   Pulse 80   Temp 98.3 F (36.8 C) (Oral)   Ht 5' 8.5" (1.74 m)   Wt 183 lb 3.2 oz (83.1 kg)   LMP 02/27/2008 Comment: tubal ligation  SpO2 93%   BMI 27.45 kg/m   GEN: Pleasant, interactive, well-appearing; in no acute distress. HEENT:  Normocephalic and atraumatic. PERRLA. Sclera white. Nasal turbinates pink, moist and patent bilaterally. No rhinorrhea present. Oropharynx pink and moist, without exudate or edema. No lesions, ulcerations, or postnasal drip. Hoarse voice quality NECK:  Supple w/ fair ROM. No JVD present. Normal carotid impulses w/o bruits. Thyroid symmetrical with no goiter or nodules palpated. No lymphadenopathy.   CV: RRR, no m/r/g, no peripheral edema. Pulses intact, +2 bilaterally. No cyanosis, pallor or clubbing. PULMONARY:  Unlabored, regular breathing. Clear  bilaterally A&P w/o wheezes/rales/rhonchi. No accessory muscle use.  GI: BS present and normoactive. Soft, non-tender to palpation. No organomegaly or masses detected.  MSK: No erythema, warmth or tenderness. No deformities or joint swelling noted.  Neuro: A/Ox3. No focal deficits noted.   Skin: Warm, no lesions or rashe Psych: Normal affect and behavior. Judgement and thought content appropriate.     Lab Results:  CBC    Component Value Date/Time   WBC 11.1 (H) 04/27/2020 0544   RBC 3.66 (L) 04/27/2020 0544   HGB 11.0 (L) 04/27/2020 0544   HCT 31.8 (L) 04/27/2020 0544   PLT 194 04/27/2020 0544   MCV 86.9 04/27/2020 0544   MCV 94.0 01/25/2014 1718   MCH 30.1 04/27/2020 0544   MCHC 34.6 04/27/2020 0544   RDW 14.6 04/27/2020 0544   LYMPHSABS 0.8 05/31/2019 0455   MONOABS 0.8 05/31/2019 0455   EOSABS 0.0 05/31/2019 0455   BASOSABS 0.0 05/31/2019 0455    BMET    Component Value Date/Time   NA 138 04/26/2020 0448   K 4.5 04/26/2020 0448   CL 103 04/26/2020 0448   CO2 26 04/26/2020 0448   GLUCOSE 130 (H) 04/26/2020 0448   BUN 24 (H) 04/26/2020 0448   CREATININE 1.00 04/26/2020 0448   CALCIUM 8.3 (L) 04/26/2020 0448   GFRNONAA 57 (L) 04/26/2020 0448   GFRAA >60 05/31/2019 0455    BNP No results found for: "BNP"   Imaging:  No results found.       Latest Ref Rng & Units 05/31/2021    9:39 AM 06/29/2016    2:15 PM  PFT Results  FVC-Pre L 1.86  1.70   FVC-Predicted Pre % 59  51   FVC-Post L 2.08  2.22   FVC-Predicted Post % 66  66   Pre FEV1/FVC % % 55  52   Post FEV1/FCV % % 55  52   FEV1-Pre L 1.03  0.89   FEV1-Predicted Pre % 43  35   FEV1-Post L 1.14  1.15   DLCO uncorrected ml/min/mmHg 12.30  9.17   DLCO UNC% % 56  30   DLCO corrected ml/min/mmHg 12.30    DLCO COR %Predicted % 56    DLVA Predicted % 77  51   TLC L 6.61  6.08   TLC % Predicted % 115  106   RV % Predicted % 174  170     No results found for: "NITRICOXIDE"      Assessment &  Plan:   COPD with chronic bronchitis and emphysema ([place]) Severe obstructive lung disease that has been stable for around the past 5 years.  She has not had any exacerbations requiring steroids or attics.  She is currently maintained on Breztri.  She is having some issues with hoarseness that she feels like is related to her inhaler.  She currently gets it for free so does not want to change inhaler regimens.  She is already using it with a spacer.  She would like to try decreasing it to 1 puff twice a day since her breathing is so stable.  Advised her that this is not the recommended dose but we can try to try it.  She will need to increase back to 2 puffs twice a day if she has any issues with her breathing or new respiratory symptoms.  Encouraged her to remain as active as possible.  Patient Instructions  Ok to try Breztri 1 puff Twice daily. Brush tongue and rinse mouth afterwards. Use with spacer. Aware that this is not the recommended dosage and increase back to 2 puffs Twice daily if you notice any trouble with your breathing or increased coughing  Continue Albuterol inhaler 2 puffs or 3 mL neb every 6 hours as needed for shortness of breath or wheezing. Notify if symptoms persist despite rescue inhaler/neb use.   Use sugar free hard candies or non-menthol cough drops during this time to soothe your throat.  Warm tea with honey and lemon. Rest your voice 2-3 days prior to your play, if possible.   Follow up in 6 months with Dr. Elsworth Soho or sooner if needed     Hoarse voice quality Hoarse voice quality related to ICS. See above.    I spent 28 minutes of dedicated to the care of this patient on the date of this encounter to include pre-visit review of records, face-to-face time with the patient discussing conditions above, post visit ordering of testing, clinical documentation with the electronic health record, making appropriate referrals as documented, and communicating necessary findings to  members of the patients care team.  Clayton Bibles, NP 12/05/2021  Pt aware and understands NP's role.

## 2021-12-05 NOTE — Patient Instructions (Addendum)
Ok to try Breztri 1 puff Twice daily. Brush tongue and rinse mouth afterwards. Use with spacer. Aware that this is not the recommended dosage and increase back to 2 puffs Twice daily if you notice any trouble with your breathing or increased coughing  Continue Albuterol inhaler 2 puffs or 3 mL neb every 6 hours as needed for shortness of breath or wheezing. Notify if symptoms persist despite rescue inhaler/neb use.   Use sugar free hard candies or non-menthol cough drops during this time to soothe your throat.  Warm tea with honey and lemon. Rest your voice 2-3 days prior to your play, if possible.   Follow up in 6 months with Dr. Elsworth Soho or sooner if needed

## 2021-12-18 ENCOUNTER — Other Ambulatory Visit (HOSPITAL_COMMUNITY): Payer: Self-pay

## 2021-12-19 NOTE — Progress Notes (Unsigned)
Office Visit    Patient Name: Rachel Chase Date of Encounter: 12/20/2021  PCP:  Charlane Ferretti, MD   [place]  Cardiologist:  Sinclair Grooms, MD  Advanced Practice Provider:  No care team member to display Electrophysiologist:  None   HPI    Rachel Chase is a 81 y.o. female with past medical history significant for CAD with known CTO of the RCA (status post cardiac arrest in 2012), COPD, hypertension, hyperlipidemia, LLL lung nodule/mass, breast CAD, lumbar disc disease, status post cholecystectomy, ex-smoker presents today for annual follow-up visit.  She was seen 11/2020 by Richardson Dopp, PA.  She had another back surgery and was taking a little longer to recover.  She was doing well without chest pain.  She does have chronic shortness of breath due to her COPD.  Her breathing was stable.  She had not had orthopnea, leg edema or syncope.  Today, she states that she recently had her gallbladder taken out and there was a stone in her duct.  She had no pain with this only took Tylenol.  She had no problems with her heart.  She has taken 1 nitroglycerin since she was last seen.  We we will refill this prescription for her today just in case.  She has not had any issues with any of her cardiac medicines however she does tell me she has an issue with her new inhaler and it bothers her throat.  She does not want to switch this medicine states she is currently in the donut hole with her insurance.  She is ultimately getting this medication for now.  Blood pressure is well controlled today.  EKG is normal sinus rhythm, rate 68 bpm.  Overall, doing well from a cardiovascular standpoint.  Reports no shortness of breath nor dyspnea on exertion. Reports no chest pain, pressure, or tightness. No edema, orthopnea, PND. Reports no palpitations.    Past Medical History    Past Medical History:  Diagnosis Date   Anxiety    Arthritis    Back pain    Lower Back  pain - takes Cortisone shots   Bone spur 2009   lower back   Breast cancer ([place]) 2015   finished radiation 12/15   Bronchitis    CAD (coronary artery disease)    a. cath 12/2010 for cardiac arrest showed    Cataract    Complication of anesthesia    throat irrition from past intubations, intubation granulomas   Coronary artery disease    Dyspnea on exertion    History of cardiac arrest 12/2010   Hyperlipemia    Hypertension    MI (myocardial infarction) ([place]) 12/2010   Was followed by Dr. 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 (followed by Lanelle Bal, MD)   Radiation 01/06/14-02/04/14   left breast    Rosacea 2008   both eyes   Past Surgical History:  Procedure Laterality Date   ANTERIOR LAT LUMBAR FUSION N/A 11/18/2018   Procedure: Lumbar One-Two Anterolateral lumbar decompression/fusion/lateral plate fixation;  Surgeon: Kristeen Miss, MD;  Location: [facility];  Service: Neurosurgery;  Laterality: N/A;  Lumbar One-Two Anterolateral lumbar decompression/fusion/lateral plate fixation   BACK SURGERY     BILIARY DILATION  05/29/2019   Procedure: BILIARY DILATION;  Surgeon: Clarene Essex, MD;  Location: [facility];  Service: Endoscopy;;  BREAST BIOPSY  8/10   fibrocystic changes   BREAST LUMPECTOMY Left 2015   BREAST LUMPECTOMY WITH NEEDLE LOCALIZATION AND AXILLARY SENTINEL LYMPH NODE BX Left 11/13/2013   Procedure: NEEDLE LOCALIZATION LEFT BREAST LUMPECTOMY WITH LEFT  AXILLARY SENTINEL LYMPH NODE BX;  Surgeon: Excell Seltzer, MD;  Location: [facility];  Service: General;  Laterality: Left;   CATARACT EXTRACTION     both   CHOLECYSTECTOMY N/A 05/30/2019   Procedure: LAPAROSCOPIC CHOLECYSTECTOMY;  Surgeon: Erroll Luna, MD;  Location: [facility];  Service: General;  Laterality: N/A;   CHOLECYSTECTOMY     dental implant     ERCP N/A 05/29/2019   Procedure: ENDOSCOPIC  RETROGRADE CHOLANGIOPANCREATOGRAPHY (ERCP);  Surgeon: Clarene Essex, MD;  Location: [facility];  Service: Endoscopy;  Laterality: N/A;   EYE SURGERY     Laser Eye Surgery approx 2007   heart stent     failed-no stents   HYSTEROSCOPY  9/10   D&C (polyp)   LEFT HEART CATHETERIZATION WITH CORONARY ANGIOGRAM N/A 01/18/2011   Procedure: LEFT HEART CATHETERIZATION WITH CORONARY ANGIOGRAM;  Surgeon: Clent Demark, MD;  Location: [facility];  Service: Cardiovascular;  Laterality: N/A;   PERCUTANEOUS CORONARY STENT INTERVENTION (PCI-S) N/A 01/18/2011   Procedure: PERCUTANEOUS CORONARY STENT INTERVENTION (PCI-S);  Surgeon: Clent Demark, MD;  [facility];  Service: Cardiovascular;  Laterality: N/A;   REMOVAL OF STONES  05/29/2019   Procedure: REMOVAL OF STONES;  Surgeon: Clarene Essex, MD;  Location: [facility];  Service: Endoscopy;;   SPHINCTEROTOMY  05/29/2019   Procedure: [name];  Surgeon: Clarene Essex, MD;  Location: [facility];  Service: Endoscopy;;   SPINAL FUSION     TONSILLECTOMY AND ADENOIDECTOMY     TUBAL LIGATION      Allergies  Allergies  Allergen Reactions   Dilaudid [Hydromorphone Hcl] Nausea And Vomiting   Penicillins Rash     EKGs/Labs/Other Studies Reviewed:   The following studies were reviewed today:   GATED SPECT MYO PERF W/LEXISCAN STRESS 1D 08/24/2016 Normal Lexiscan nuclear stress test with no evidence for prior infarct or ischemia.  EF 71   ECHO COMPLETE WO IMAGING ENHANCING AGENT 08/24/2016 EF 55-60, no RWMA, GR 1 DD, mild MR    Cardiac catheterization 01/18/11 Her left main was short which was patent.  LAD has 20-30% proximal and 25-35% mid sequential stenosis.  Left circumflex has proximal 30% stenosis.  OM-1 and 2 are very small.  OM-3 is large which is patent.  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  EKG:  EKG is  ordered today.  The ekg ordered today demonstrates NSR rate  68 bpm  Recent Labs: No results found for requested labs within last 365 days.  Recent Lipid Panel    Component Value Date/Time   CHOL 128 01/18/2011 2000   TRIG 103 01/18/2011 2000   HDL 47 01/18/2011 2000   CHOLHDL 2.7 01/18/2011 2000   VLDL 21 01/18/2011 2000   LDLCALC 60 01/18/2011 2000   Home Medications   Current Meds  Medication Sig   aspirin EC 81 MG tablet Take 81 mg by mouth daily with supper.   atorvastatin (LIPITOR) 40 MG tablet Take 1 tablet (40 mg total) by mouth daily.   Budeson-Glycopyrrol-Formoterol (BREZTRI AEROSPHERE) 160-9-4.8 MCG/ACT AERO Inhale 2 puffs into the lungs in the morning and at bedtime.   hydrochlorothiazide (HYDRODIURIL) 25 MG tablet Take 1 tablet (25 mg total) by mouth in the  morning.   HYDROcodone-acetaminophen (NORCO/VICODIN) 5-325 MG tablet Take 1-1.5 tablets by mouth daily as needed.   lisinopril (ZESTRIL) 40 MG tablet Take 1 tablet (40 mg total) by mouth daily.   LORazepam (ATIVAN) 1 MG tablet Take 1 tablet (1 mg total) by mouth at bedtime.   metoprolol tartrate (LOPRESSOR) 50 MG tablet Take 1 tablet (50 mg total) by mouth 2 (two) times daily.   mirabegron ER (MYRBETRIQ) 25 MG TB24 tablet Take 1 tablet (25 mg total) by mouth daily.   Multiple Vitamins-Minerals (PRESERVISION AREDS 2 PO) Take 1 tablet by mouth in the morning and at bedtime.   Spacer/Aero-Holding Chambers (AEROCHAMBER MV) inhaler Use as instructed   Vitamin D, Ergocalciferol, (DRISDOL) 1.25 MG (50000 UNIT) CAPS capsule Take 1 capsule (50,000 Units total) by mouth every 14 (fourteen) days.   [DISCONTINUED] nitroGLYCERIN (NITROSTAT) 0.4 MG SL tablet DISSOLVE ONE TABLET UNDER THE TONGUE AS NEEDED FOR CHEST PAIN AS DIRECTED     Review of Systems      All other systems reviewed and are otherwise negative except as noted above.  Physical Exam    VS:  BP 132/64   Pulse 68   Ht '5\' 8"'$  (1.727 m)   Wt 182 lb (82.6 kg)   LMP 02/27/2008 Comment: tubal ligation  SpO2 95%   BMI 27.67  kg/m  , BMI Body mass index is 27.67 kg/m.  Wt Readings from Last 3 Encounters:  12/20/21 182 lb (82.6 kg)  12/05/21 183 lb 3.2 oz (83.1 kg)  05/31/21 178 lb (80.7 kg)     GEN: Well nourished, well developed, in no acute distress. HEENT: normal. Neck: Supple, no JVD, carotid bruits, or masses. Cardiac: RRR, no murmurs, rubs, or gallops. No clubbing, cyanosis, edema.  Radials/PT 2+ and equal bilaterally.  Respiratory:  Respirations regular and unlabored, clear to auscultation bilaterally. GI: Soft, nontender, nondistended. MS: No deformity or atrophy. Skin: Warm and dry, no rash. Neuro:  Strength and sensation are intact. Psych: Normal affect.  Assessment & Plan    Coronary artery disease -no chest pain -she does have chronic DOE (copd) -She has a pulmonologist -Continue GDMT: Aspirin 81 mg, Lipitor 40 mg, HCTZ 25 mg, lisinopril 40 mg, Lopressor 50 mg twice a day, nitro as needed  Hypertension -good control 132/64 -continue current medications  Hyperlipidemia -most recent 07/2021 -Total cholesterol 152, LDL 74, HDL 55, triglycerides 131 -All numbers at goal   4. Provider change -referred to Dr. Johney Frame          Disposition: Follow up 3 months with Sinclair Grooms, MD or APP.  Signed, Elgie Collard, PA-C 12/20/2021, 12:32 PM [facility]

## 2021-12-20 ENCOUNTER — Encounter: Payer: Self-pay | Admitting: Physician Assistant

## 2021-12-20 ENCOUNTER — Other Ambulatory Visit (HOSPITAL_COMMUNITY): Payer: Self-pay

## 2021-12-20 ENCOUNTER — Ambulatory Visit: Payer: Medicare Other | Attending: Physician Assistant | Admitting: Physician Assistant

## 2021-12-20 VITALS — BP 132/64 | HR 68 | Ht 68.0 in | Wt 182.0 lb

## 2021-12-20 DIAGNOSIS — I10 Essential (primary) hypertension: Secondary | ICD-10-CM

## 2021-12-20 DIAGNOSIS — E785 Hyperlipidemia, unspecified: Secondary | ICD-10-CM

## 2021-12-20 DIAGNOSIS — I2510 Atherosclerotic heart disease of native coronary artery without angina pectoris: Secondary | ICD-10-CM | POA: Diagnosis not present

## 2021-12-20 MED ORDER — NITROGLYCERIN 0.4 MG SL SUBL
SUBLINGUAL_TABLET | SUBLINGUAL | 2 refills | Status: DC
  Filled 2021-12-20: qty 25, 25d supply, fill #0

## 2021-12-20 NOTE — Patient Instructions (Signed)
Medication Instructions:  Your physician recommends that you continue on your current medications as directed. Please refer to the Current Medication list given to you today.  *If you need a refill on your cardiac medications before your next appointment, please call your pharmacy*   Lab Work: NOne If you have labs (blood work) drawn today and your tests are completely normal, you will receive your results only by: [place] (if you have MyChart) OR A paper copy in the mail If you have any lab test that is abnormal or we need to change your treatment, we will call you to review the results.   Follow-Up: At [facility], you and your health needs are our priority.  As part of our continuing mission to provide you with exceptional heart care, we have created designated Provider Care Teams.  These Care Teams include your primary Cardiologist (physician) and Advanced Practice Providers (APPs -  Physician Assistants and Nurse Practitioners) who all work together to provide you with the care you need, when you need it.   Your next appointment:   3 month(s)  The format for your next appointment:   In Person  Provider:   Dr Johney Frame  Important Information About Sugar

## 2021-12-22 ENCOUNTER — Other Ambulatory Visit: Payer: Self-pay | Admitting: *Deleted

## 2021-12-22 MED ORDER — BREZTRI AEROSPHERE 160-9-4.8 MCG/ACT IN AERO: 2.0000 | INHALATION_SPRAY | Freq: Two times a day (BID) | RESPIRATORY_TRACT | 3 refills | Status: DC

## 2021-12-25 ENCOUNTER — Other Ambulatory Visit (HOSPITAL_COMMUNITY): Payer: Self-pay

## 2022-01-01 ENCOUNTER — Other Ambulatory Visit: Payer: Self-pay

## 2022-01-01 ENCOUNTER — Telehealth: Payer: Self-pay | Admitting: Nurse Practitioner

## 2022-01-01 MED ORDER — BREZTRI AEROSPHERE 160-9-4.8 MCG/ACT IN AERO: 2.0000 | INHALATION_SPRAY | Freq: Two times a day (BID) | RESPIRATORY_TRACT | 6 refills | Status: DC

## 2022-01-01 NOTE — Telephone Encounter (Signed)
Pt called stating that she is still needing Rx for Breztri to be sent to [facility]. Routing encounter to [place] for her to help with.

## 2022-01-05 ENCOUNTER — Other Ambulatory Visit (HOSPITAL_COMMUNITY): Payer: Self-pay

## 2022-01-05 MED ORDER — HYDROCODONE-ACETAMINOPHEN 5-325 MG PO TABS
1.0000 | ORAL_TABLET | Freq: Every day | ORAL | 0 refills | Status: DC
  Filled 2022-01-05: qty 40, 30d supply, fill #0

## 2022-01-11 ENCOUNTER — Other Ambulatory Visit (HOSPITAL_COMMUNITY): Payer: Self-pay

## 2022-01-12 ENCOUNTER — Other Ambulatory Visit (HOSPITAL_COMMUNITY): Payer: Self-pay

## 2022-01-12 MED ORDER — LORAZEPAM 1 MG PO TABS
1.0000 mg | ORAL_TABLET | Freq: Every day | ORAL | 5 refills | Status: DC
  Filled 2022-01-16: qty 30, 30d supply, fill #0
  Filled 2022-02-14: qty 30, 30d supply, fill #1
  Filled 2022-03-15: qty 30, 30d supply, fill #2
  Filled 2022-04-09: qty 30, 30d supply, fill #3
  Filled 2022-05-13: qty 30, 30d supply, fill #4
  Filled 2022-06-13: qty 30, 30d supply, fill #5

## 2022-01-16 ENCOUNTER — Other Ambulatory Visit (HOSPITAL_COMMUNITY): Payer: Self-pay

## 2022-01-28 ENCOUNTER — Other Ambulatory Visit (HOSPITAL_COMMUNITY): Payer: Self-pay

## 2022-01-30 ENCOUNTER — Other Ambulatory Visit (HOSPITAL_COMMUNITY): Payer: Self-pay

## 2022-01-30 DIAGNOSIS — I10 Essential (primary) hypertension: Secondary | ICD-10-CM | POA: Diagnosis not present

## 2022-01-30 DIAGNOSIS — D692 Other nonthrombocytopenic purpura: Secondary | ICD-10-CM | POA: Diagnosis not present

## 2022-01-30 DIAGNOSIS — Z23 Encounter for immunization: Secondary | ICD-10-CM | POA: Diagnosis not present

## 2022-01-30 MED ORDER — MYRBETRIQ 25 MG PO TB24
25.0000 mg | ORAL_TABLET | Freq: Every day | ORAL | 6 refills | Status: DC
  Filled 2022-01-30: qty 30, 30d supply, fill #0
  Filled 2022-03-03: qty 30, 30d supply, fill #1
  Filled 2022-03-30: qty 30, 30d supply, fill #2

## 2022-02-05 ENCOUNTER — Other Ambulatory Visit (HOSPITAL_BASED_OUTPATIENT_CLINIC_OR_DEPARTMENT_OTHER): Payer: Self-pay | Admitting: Obstetrics & Gynecology

## 2022-02-05 ENCOUNTER — Other Ambulatory Visit (HOSPITAL_COMMUNITY): Payer: Self-pay

## 2022-02-05 DIAGNOSIS — E559 Vitamin D deficiency, unspecified: Secondary | ICD-10-CM

## 2022-02-05 MED ORDER — VITAMIN D (ERGOCALCIFEROL) 1.25 MG (50000 UNIT) PO CAPS
50000.0000 [IU] | ORAL_CAPSULE | ORAL | 3 refills | Status: DC
  Filled 2022-02-05: qty 6, 84d supply, fill #0
  Filled 2022-04-30: qty 6, 84d supply, fill #1
  Filled 2022-08-13: qty 6, 84d supply, fill #2
  Filled 2022-11-07 – 2022-11-12 (×2): qty 6, 84d supply, fill #3

## 2022-02-07 ENCOUNTER — Other Ambulatory Visit (HOSPITAL_COMMUNITY): Payer: Self-pay

## 2022-02-07 MED ORDER — HYDROCODONE-ACETAMINOPHEN 5-325 MG PO TABS
1.0000 | ORAL_TABLET | Freq: Every day | ORAL | 0 refills | Status: DC | PRN
  Filled 2022-02-07: qty 40, 30d supply, fill #0

## 2022-02-14 ENCOUNTER — Other Ambulatory Visit: Payer: Self-pay

## 2022-02-14 ENCOUNTER — Other Ambulatory Visit (HOSPITAL_COMMUNITY): Payer: Self-pay

## 2022-02-21 DIAGNOSIS — R051 Acute cough: Secondary | ICD-10-CM | POA: Diagnosis not present

## 2022-03-05 ENCOUNTER — Ambulatory Visit
Admission: RE | Admit: 2022-03-05 | Discharge: 2022-03-05 | Disposition: A | Discharge status: Home / Self Care | Payer: Medicare Other | Source: Ambulatory Visit | Attending: Internal Medicine | Admitting: Internal Medicine

## 2022-03-05 ENCOUNTER — Other Ambulatory Visit (HOSPITAL_COMMUNITY): Payer: Self-pay

## 2022-03-05 ENCOUNTER — Other Ambulatory Visit: Payer: Self-pay | Admitting: Internal Medicine

## 2022-03-05 DIAGNOSIS — R062 Wheezing: Secondary | ICD-10-CM

## 2022-03-05 MED ORDER — ALBUTEROL SULFATE HFA 108 (90 BASE) MCG/ACT IN AERS
1.0000 | INHALATION_SPRAY | RESPIRATORY_TRACT | 3 refills | Status: DC | PRN
  Filled 2022-03-05: qty 6.7, 34d supply, fill #0

## 2022-03-05 MED ORDER — PREDNISONE 20 MG PO TABS
40.0000 mg | ORAL_TABLET | Freq: Every day | ORAL | 0 refills | Status: DC
  Filled 2022-03-05: qty 10, 5d supply, fill #0

## 2022-03-16 ENCOUNTER — Other Ambulatory Visit (HOSPITAL_COMMUNITY): Payer: Self-pay

## 2022-04-02 ENCOUNTER — Other Ambulatory Visit (HOSPITAL_COMMUNITY): Payer: Self-pay

## 2022-04-05 ENCOUNTER — Other Ambulatory Visit (HOSPITAL_COMMUNITY): Payer: Self-pay

## 2022-04-05 MED ORDER — HYDROCODONE-ACETAMINOPHEN 5-325 MG PO TABS
1.0000 | ORAL_TABLET | Freq: Every day | ORAL | 0 refills | Status: DC
  Filled 2022-04-05: qty 40, 30d supply, fill #0

## 2022-04-06 ENCOUNTER — Other Ambulatory Visit (HOSPITAL_COMMUNITY): Payer: Self-pay

## 2022-04-10 ENCOUNTER — Other Ambulatory Visit: Payer: Self-pay

## 2022-04-10 ENCOUNTER — Other Ambulatory Visit (HOSPITAL_COMMUNITY): Payer: Self-pay

## 2022-04-11 ENCOUNTER — Other Ambulatory Visit: Payer: Self-pay

## 2022-04-15 NOTE — Progress Notes (Unsigned)
Cardiology Office Note:    Date:  04/18/2022   ID:  Rachel Chase, DOB 1940-09-05, MRN [id number]  PCP:  Charlane Ferretti, MD   [place] Providers Cardiologist:  Sinclair Grooms, MD (Inactive) {  Referring MD: Charlane Ferretti, MD    History of Present Illness:    Rachel Chase is a 82 y.o. female with a hx of CAD with known CTO of the RCA (status post cardiac arrest in 2012), COPD, hypertension, hyperlipidemia, LLL lung nodule/mass, breast CAD, lumbar disc disease, status post cholecystectomy, ex-smoker who presents to clinic for follow-up.  Was last seen in clinic on 11/2021 by Nicholes Rough, PA-C where she was doing well from a CV standpoint.   Today, the patient overall feels well. No chest pain, SOB, LE edema, PND, orthopnea or palpitations. Tolerating medications as prescribed. Blood pressure well controlled at home.    Past Medical History:  Diagnosis Date   Anxiety    Arthritis    Back pain    Lower Back pain - takes Cortisone shots   Bone spur 2009   lower back   Breast cancer ([place]) 2015   finished radiation 12/15   Bronchitis    CAD (coronary artery disease)    a. cath 12/2010 for cardiac arrest showed    Cataract    Complication of anesthesia    throat irrition from past intubations, intubation granulomas   Coronary artery disease    Dyspnea on exertion    History of cardiac arrest 12/2010   Hyperlipemia    Hypertension    MI (myocardial infarction) ([place]) 12/2010   Was followed by Dr. 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 (followed by Lanelle Bal, MD)   Radiation 01/06/14-02/04/14   left breast    Rosacea 2008   both eyes    Past Surgical History:  Procedure Laterality Date   ANTERIOR LAT LUMBAR FUSION N/A 11/18/2018   Procedure: Lumbar One-Two Anterolateral lumbar decompression/fusion/lateral plate fixation;  Surgeon: Kristeen Miss, MD;   Location: [facility];  Service: Neurosurgery;  Laterality: N/A;  Lumbar One-Two Anterolateral lumbar decompression/fusion/lateral plate fixation   BACK SURGERY     BILIARY DILATION  05/29/2019   Procedure: BILIARY DILATION;  Surgeon: Clarene Essex, MD;  Location: [facility];  Service: Endoscopy;;   BREAST BIOPSY  8/10   fibrocystic changes   BREAST LUMPECTOMY Left 2015   BREAST LUMPECTOMY WITH NEEDLE LOCALIZATION AND AXILLARY SENTINEL LYMPH NODE BX Left 11/13/2013   Procedure: NEEDLE LOCALIZATION LEFT BREAST LUMPECTOMY WITH LEFT  AXILLARY SENTINEL LYMPH NODE BX;  Surgeon: Excell Seltzer, MD;  Location: [facility] [facility];  Service: General;  Laterality: Left;   CATARACT EXTRACTION     both   CHOLECYSTECTOMY N/A 05/30/2019   Procedure: LAPAROSCOPIC CHOLECYSTECTOMY;  Surgeon: Erroll Luna, MD;  Location: [facility];  Service: General;  Laterality: N/A;   CHOLECYSTECTOMY     dental implant     ERCP N/A 05/29/2019   Procedure: ENDOSCOPIC RETROGRADE CHOLANGIOPANCREATOGRAPHY (ERCP);  Surgeon: Clarene Essex, MD;  Location: [facility];  Service: Endoscopy;  Laterality: N/A;   EYE SURGERY     Laser Eye Surgery approx 2007   heart stent     failed-no stents   HYSTEROSCOPY  9/10   D&C (polyp)   LEFT HEART CATHETERIZATION WITH CORONARY ANGIOGRAM N/A 01/18/2011   Procedure: LEFT HEART CATHETERIZATION WITH CORONARY  ANGIOGRAM;  Surgeon: Clent Demark, MD;  Location[facility] LAB;  Service: Cardiovascular;  Laterality: N/A;   PERCUTANEOUS CORONARY STENT INTERVENTION (PCI-S) N/A 01/18/2011   Procedure: PERCUTANEOUS CORONARY STENT INTERVENTION (PCI-S);  Surgeon: Clent Demark, MD;  Location: [facility] CATH LAB;  Service: Cardiovascular;  Laterality: N/A;   REMOVAL OF STONES  05/29/2019   Procedure: REMOVAL OF STONES;  Surgeon: Clarene Essex, MD;  Location: [facility];  Service: Endoscopy;;   SPHINCTEROTOMY  05/29/2019   Procedure: [name];  Surgeon: Clarene Essex, MD;  Location: [facility];  Service:  Endoscopy;;   SPINAL FUSION     TONSILLECTOMY AND ADENOIDECTOMY     TUBAL LIGATION      Current Medications: Current Meds  Medication Sig   albuterol (VENTOLIN HFA) 108 (90 Base) MCG/ACT inhaler Inhale 1 puff into the lungs as needed.   aspirin EC 81 MG tablet Take 81 mg by mouth daily with supper.   atorvastatin (LIPITOR) 40 MG tablet Take 1 tablet (40 mg total) by mouth daily.   Budeson-Glycopyrrol-Formoterol (BREZTRI AEROSPHERE) 160-9-4.8 MCG/ACT AERO Inhale 2 puffs into the lungs in the morning and at bedtime.   hydrochlorothiazide (HYDRODIURIL) 25 MG tablet Take 1 tablet (25 mg total) by mouth in the morning.   HYDROcodone-acetaminophen (NORCO/VICODIN) 5-325 MG tablet Take 1 - 1 & 1/2 tablets by mouth daily as needed.   lisinopril (ZESTRIL) 40 MG tablet Take 1 tablet (40 mg total) by mouth daily.   LORazepam (ATIVAN) 1 MG tablet Take 1 tablet (1 mg total) by mouth at bedtime.   metoprolol tartrate (LOPRESSOR) 50 MG tablet Take 1 tablet (50 mg total) by mouth 2 (two) times daily.   mirabegron ER (MYRBETRIQ) 25 MG TB24 tablet Take 1 tablet (25 mg total) by mouth daily.   Multiple Vitamins-Minerals (PRESERVISION AREDS 2 PO) Take 1 tablet by mouth in the morning and at bedtime.   nitroGLYCERIN (NITROSTAT) 0.4 MG SL tablet DISSOLVE ONE TABLET UNDER THE TONGUE AS NEEDED FOR CHEST PAIN   Spacer/Aero-Holding Chambers (AEROCHAMBER MV) inhaler Use as instructed   Vitamin D, Ergocalciferol, (DRISDOL) 1.25 MG (50000 UNIT) CAPS capsule Take 1 capsule (50,000 Units total) by mouth every 14 (fourteen) days.   [DISCONTINUED] Budeson-Glycopyrrol-Formoterol (BREZTRI AEROSPHERE) 160-9-4.8 MCG/ACT AERO Inhale 2 puffs into the lungs in the morning and at bedtime.   [DISCONTINUED] Budeson-Glycopyrrol-Formoterol (BREZTRI AEROSPHERE) 160-9-4.8 MCG/ACT AERO Inhale 2 puffs into the lungs in the morning and at bedtime.   [DISCONTINUED] predniSONE (DELTASONE) 20 MG tablet Take 2 tablets (40 mg total) by mouth  daily for 5 days     Allergies:   Dilaudid [hydromorphone hcl] and Penicillins   Social History   Socioeconomic History   Marital status: Widowed    Spouse name: Not on file   Number of children: 3   Years of education: Not on file   Highest education level: Not on file  Occupational History   Not on file  Tobacco Use   Smoking status: Former    Packs/day: 1.50    Years: 45.00    Total pack years: 67.50    Types: Cigarettes    Quit date: 04/17/1998    Years since quitting: 24.0   Smokeless tobacco: Never   Tobacco comments:    nicorette gum  Vaping Use   Vaping Use: Never used  Substance and Sexual Activity   Alcohol use: Yes    Comment: 1-2 a month   Drug use: No   Sexual activity: Not Currently  Partners: Male    Birth control/protection: Post-menopausal  Other Topics Concern   Not on file  Social History Narrative   Not on file   Social Determinants of Health   Financial Resource Strain: Not on file  Food Insecurity: Not on file  Transportation Needs: Not on file  Physical Activity: Not on file  Stress: Not on file  Social Connections: Not on file     Family History: The patient's family history includes Breast cancer in her maternal aunt and sister; Breast cancer (age of onset: 105) in her maternal grandmother; CVA in her father; Cancer in her sister; Congestive Heart Failure in her father; Coronary artery disease in her father; Hypertension in her mother; Rectal cancer in her sister; Valvular heart disease in her mother.  ROS:   Please see the history of present illness.     All other systems reviewed and are negative.  EKGs/Labs/Other Studies Reviewed:    The following studies were reviewed today: GATED SPECT MYO PERF W/LEXISCAN STRESS 1D 08/24/2016 Normal Lexiscan nuclear stress test with no evidence for prior infarct or ischemia.  EF 71   ECHO COMPLETE WO IMAGING ENHANCING AGENT 08/24/2016 EF 55-60, no RWMA, GR 1 DD, mild MR    Cardiac  catheterization 01/18/11 Her left main was short which was patent.  LAD has 20-30% proximal and 25-35% mid sequential stenosis.  Left circumflex has proximal 30% stenosis.  OM-1 and 2 are very small.  OM-3 is large which is patent.  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  EKG:  EKG is not ordered today.    Recent Labs: No results found for requested labs within last 365 days.  Recent Lipid Panel    Component Value Date/Time   CHOL 128 01/18/2011 2000   TRIG 103 01/18/2011 2000   HDL 47 01/18/2011 2000   CHOLHDL 2.7 01/18/2011 2000   VLDL 21 01/18/2011 2000   House 60 01/18/2011 2000     Risk Assessment/Calculations:                Physical Exam:    VS:  BP 122/62   Pulse 65   Ht 5' 8"$  (1.727 m)   Wt 183 lb 9.6 oz (83.3 kg)   LMP 02/27/2008 Comment: tubal ligation  SpO2 98%   BMI 27.92 kg/m     Wt Readings from Last 3 Encounters:  04/18/22 183 lb 9.6 oz (83.3 kg)  12/20/21 182 lb (82.6 kg)  12/05/21 183 lb 3.2 oz (83.1 kg)     GEN:  Well nourished, well developed in no acute distress HEENT: Normal NECK: No JVD; No carotid bruits CARDIAC: RRR, no murmurs, rubs, gallops RESPIRATORY:  Clear to auscultation without rales, wheezing or rhonchi  ABDOMEN: Soft, non-tender, non-distended MUSCULOSKELETAL:  No edema; No deformity  SKIN: Warm and dry NEUROLOGIC:  Alert and oriented x 3 PSYCHIATRIC:  Normal affect   ASSESSMENT:    1. Coronary artery disease involving native coronary artery of native heart without angina pectoris   2. Essential hypertension   3. Pure hypercholesterolemia   4. COPD with chronic bronchitis and emphysema ([place])    PLAN:    In order of problems listed above:  #CAD: Has CTO of the RCA. Currently doing well without anginal symptoms.  -Continue ASA 80m daily -Conitnue lipitor 470mdaily -Continue metop tartrate 504mID -Continue lisinopril 37m29mily  #HTN: BP well  controlled at this time. -Continue metop tartrate 50mg37m  BID -Continue lisinopril 25m daily -Continue HCTZ 279mdaily  #HLD: -Conitnue lipitor 4032maily -LDL 74 in 07/2021; repeat with PCP  #COPD: Follows with Dr. AlvElsworth Soho       Medication Adjustments/Labs and Tests Ordered: Current medicines are reviewed at length with the patient today.  Concerns regarding medicines are outlined above.  No orders of the defined types were placed in this encounter.  No orders of the defined types were placed in this encounter.   Patient Instructions  Medication Instructions:   Your physician recommends that you continue on your current medications as directed. Please refer to the Current Medication list given to you today.  *If you need a refill on your cardiac medications before your next appointment, please call your pharmacy*    Follow-Up: [facility] and your health needs are our priority.  As part of our continuing mission to provide you with exceptional heart care, we have created designated Provider Care Teams.  These Care Teams include your primary Cardiologist (physician) and Advanced Practice Providers (APPs -  Physician Assistants and Nurse Practitioners) who all work together to provide you with the care you need, when you need it.  We recommend signing up for the patient portal called "MyChart".  Sign up information is provided on this After Visit Summary.  MyChart is used to connect with patients for Virtual Visits (Telemedicine).  Patients are able to view lab/test results, encounter notes, upcoming appointments, etc.  Non-urgent messages can be sent to your provider as well.   To learn more about what you can do with MyChart, go to [email or web address]  Your next appointment:   1 year(s)  Provider:   DR. PEMJohney Frame  Signed, HeaFreada BergeronD  04/18/2022 11:36 AM    [place]

## 2022-04-18 ENCOUNTER — Ambulatory Visit: Payer: Medicare Other | Attending: Cardiology | Admitting: Cardiology

## 2022-04-18 ENCOUNTER — Encounter: Payer: Self-pay | Admitting: Cardiology

## 2022-04-18 VITALS — BP 122/62 | HR 65 | Ht 68.0 in | Wt 183.6 lb

## 2022-04-18 DIAGNOSIS — I2510 Atherosclerotic heart disease of native coronary artery without angina pectoris: Secondary | ICD-10-CM | POA: Diagnosis not present

## 2022-04-18 DIAGNOSIS — I10 Essential (primary) hypertension: Secondary | ICD-10-CM | POA: Diagnosis not present

## 2022-04-18 DIAGNOSIS — E7800 Pure hypercholesterolemia, unspecified: Secondary | ICD-10-CM

## 2022-04-18 DIAGNOSIS — J4489 Other specified chronic obstructive pulmonary disease: Secondary | ICD-10-CM | POA: Diagnosis not present

## 2022-04-18 DIAGNOSIS — J439 Emphysema, unspecified: Secondary | ICD-10-CM

## 2022-04-18 NOTE — Patient Instructions (Signed)
Medication Instructions:   Your physician recommends that you continue on your current medications as directed. Please refer to the Current Medication list given to you today.  *If you need a refill on your cardiac medications before your next appointment, please call your pharmacy*    Follow-Up: At [facility], you and your health needs are our priority.  As part of our continuing mission to provide you with exceptional heart care, we have created designated Provider Care Teams.  These Care Teams include your primary Cardiologist (physician) and Advanced Practice Providers (APPs -  Physician Assistants and Nurse Practitioners) who all work together to provide you with the care you need, when you need it.  We recommend signing up for the patient portal called "MyChart".  Sign up information is provided on this After Visit Summary.  MyChart is used to connect with patients for Virtual Visits (Telemedicine).  Patients are able to view lab/test results, encounter notes, upcoming appointments, etc.  Non-urgent messages can be sent to your provider as well.   To learn more about what you can do with MyChart, go to [email or web address].    Your next appointment:   1 year(s)  Provider:   DR. Johney Frame

## 2022-04-24 ENCOUNTER — Other Ambulatory Visit: Payer: Self-pay | Admitting: Internal Medicine

## 2022-04-24 DIAGNOSIS — Z1231 Encounter for screening mammogram for malignant neoplasm of breast: Secondary | ICD-10-CM

## 2022-05-14 ENCOUNTER — Other Ambulatory Visit: Payer: Self-pay

## 2022-05-22 ENCOUNTER — Other Ambulatory Visit (HOSPITAL_COMMUNITY): Payer: Self-pay

## 2022-05-23 ENCOUNTER — Other Ambulatory Visit (HOSPITAL_COMMUNITY): Payer: Self-pay

## 2022-05-23 MED ORDER — HYDROCODONE-ACETAMINOPHEN 5-325 MG PO TABS
1.0000 | ORAL_TABLET | Freq: Every day | ORAL | 0 refills | Status: DC | PRN
  Filled 2022-05-23: qty 40, 30d supply, fill #0

## 2022-06-05 ENCOUNTER — Other Ambulatory Visit: Payer: Medicare Other

## 2022-06-12 ENCOUNTER — Ambulatory Visit
Admission: RE | Admit: 2022-06-12 | Discharge: 2022-06-12 | Disposition: A | Discharge status: Home / Self Care | Payer: Medicare Other | Source: Ambulatory Visit | Attending: Internal Medicine | Admitting: Internal Medicine

## 2022-06-12 DIAGNOSIS — Z1231 Encounter for screening mammogram for malignant neoplasm of breast: Secondary | ICD-10-CM

## 2022-06-14 ENCOUNTER — Other Ambulatory Visit: Payer: Self-pay

## 2022-06-14 ENCOUNTER — Other Ambulatory Visit (HOSPITAL_COMMUNITY): Payer: Self-pay

## 2022-06-18 ENCOUNTER — Other Ambulatory Visit (HOSPITAL_COMMUNITY): Payer: Self-pay

## 2022-06-18 MED ORDER — TRIAMCINOLONE ACETONIDE 0.1 % EX CREA
TOPICAL_CREAM | Freq: Two times a day (BID) | CUTANEOUS | 2 refills | Status: AC
  Filled 2022-06-18: qty 80, 30d supply, fill #0
  Filled 2022-10-02 (×2): qty 80, 30d supply, fill #1
  Filled 2022-11-12: qty 80, 30d supply, fill #2

## 2022-06-19 ENCOUNTER — Other Ambulatory Visit (HOSPITAL_COMMUNITY): Payer: Self-pay

## 2022-07-09 ENCOUNTER — Other Ambulatory Visit (HOSPITAL_COMMUNITY): Payer: Self-pay

## 2022-07-09 MED ORDER — HYDROCODONE-ACETAMINOPHEN 5-325 MG PO TABS
1.0000 | ORAL_TABLET | Freq: Every day | ORAL | 0 refills | Status: DC | PRN
  Filled 2022-07-09: qty 40, 30d supply, fill #0

## 2022-07-12 ENCOUNTER — Other Ambulatory Visit (HOSPITAL_COMMUNITY): Payer: Self-pay

## 2022-07-12 MED ORDER — LORAZEPAM 1 MG PO TABS
1.0000 mg | ORAL_TABLET | Freq: Every evening | ORAL | 5 refills | Status: DC | PRN
  Filled 2022-07-12: qty 30, 30d supply, fill #0
  Filled 2022-08-12: qty 30, 30d supply, fill #1
  Filled 2022-09-08: qty 30, 30d supply, fill #2
  Filled 2022-10-10 – 2022-10-13 (×2): qty 30, 30d supply, fill #3
  Filled 2022-11-07 – 2022-11-12 (×2): qty 30, 30d supply, fill #4
  Filled 2022-12-08: qty 30, 30d supply, fill #5

## 2022-07-17 ENCOUNTER — Other Ambulatory Visit (HOSPITAL_COMMUNITY): Payer: Self-pay

## 2022-08-08 DIAGNOSIS — J449 Chronic obstructive pulmonary disease, unspecified: Secondary | ICD-10-CM | POA: Diagnosis not present

## 2022-08-08 DIAGNOSIS — I25119 Atherosclerotic heart disease of native coronary artery with unspecified angina pectoris: Secondary | ICD-10-CM | POA: Diagnosis not present

## 2022-08-08 DIAGNOSIS — E7800 Pure hypercholesterolemia, unspecified: Secondary | ICD-10-CM | POA: Diagnosis not present

## 2022-08-08 DIAGNOSIS — G894 Chronic pain syndrome: Secondary | ICD-10-CM | POA: Diagnosis not present

## 2022-08-08 DIAGNOSIS — Z79899 Other long term (current) drug therapy: Secondary | ICD-10-CM | POA: Diagnosis not present

## 2022-08-08 DIAGNOSIS — Z0000 Encounter for general adult medical examination without abnormal findings: Secondary | ICD-10-CM | POA: Diagnosis not present

## 2022-08-08 DIAGNOSIS — D692 Other nonthrombocytopenic purpura: Secondary | ICD-10-CM | POA: Diagnosis not present

## 2022-08-08 DIAGNOSIS — Z780 Asymptomatic menopausal state: Secondary | ICD-10-CM | POA: Diagnosis not present

## 2022-08-08 DIAGNOSIS — I739 Peripheral vascular disease, unspecified: Secondary | ICD-10-CM | POA: Diagnosis not present

## 2022-08-12 ENCOUNTER — Other Ambulatory Visit (HOSPITAL_COMMUNITY): Payer: Self-pay

## 2022-08-13 ENCOUNTER — Other Ambulatory Visit: Payer: Self-pay

## 2022-08-13 ENCOUNTER — Other Ambulatory Visit (HOSPITAL_COMMUNITY): Payer: Self-pay

## 2022-08-13 MED ORDER — HYDROCHLOROTHIAZIDE 25 MG PO TABS
25.0000 mg | ORAL_TABLET | Freq: Every morning | ORAL | 4 refills | Status: AC
  Filled 2022-08-13: qty 90, 90d supply, fill #0
  Filled 2022-11-07 – 2022-11-12 (×2): qty 90, 90d supply, fill #1
  Filled 2023-01-30: qty 90, 90d supply, fill #2
  Filled 2023-05-07: qty 90, 90d supply, fill #3

## 2022-08-14 ENCOUNTER — Other Ambulatory Visit: Payer: Self-pay | Admitting: Internal Medicine

## 2022-08-14 DIAGNOSIS — Z780 Asymptomatic menopausal state: Secondary | ICD-10-CM

## 2022-08-16 ENCOUNTER — Other Ambulatory Visit (HOSPITAL_COMMUNITY): Payer: Self-pay

## 2022-08-16 MED ORDER — METOPROLOL TARTRATE 50 MG PO TABS
50.0000 mg | ORAL_TABLET | Freq: Two times a day (BID) | ORAL | 3 refills | Status: DC
  Filled 2022-08-16: qty 180, 90d supply, fill #0
  Filled 2022-11-07 – 2022-11-12 (×2): qty 180, 90d supply, fill #1
  Filled 2023-01-30: qty 180, 90d supply, fill #2
  Filled 2023-05-09: qty 180, 90d supply, fill #3

## 2022-08-16 MED ORDER — LISINOPRIL 40 MG PO TABS
40.0000 mg | ORAL_TABLET | Freq: Every day | ORAL | 3 refills | Status: DC
  Filled 2022-08-16: qty 90, 90d supply, fill #0
  Filled 2022-11-07 – 2022-11-12 (×2): qty 90, 90d supply, fill #1
  Filled 2023-01-30: qty 90, 90d supply, fill #2
  Filled 2023-05-09: qty 90, 90d supply, fill #3

## 2022-08-16 MED ORDER — HYDROCODONE-ACETAMINOPHEN 5-325 MG PO TABS
1.0000 | ORAL_TABLET | Freq: Every day | ORAL | 0 refills | Status: DC | PRN
  Filled 2022-08-16: qty 45, 30d supply, fill #0

## 2022-08-16 MED ORDER — ATORVASTATIN CALCIUM 40 MG PO TABS
40.0000 mg | ORAL_TABLET | Freq: Every day | ORAL | 3 refills | Status: DC
  Filled 2022-08-16: qty 90, 90d supply, fill #0
  Filled 2022-11-07 – 2022-11-12 (×2): qty 90, 90d supply, fill #1
  Filled 2023-02-04: qty 90, 90d supply, fill #2
  Filled 2023-05-09: qty 90, 90d supply, fill #3

## 2022-08-17 ENCOUNTER — Other Ambulatory Visit (HOSPITAL_COMMUNITY): Payer: Self-pay

## 2022-08-31 ENCOUNTER — Telehealth (HOSPITAL_BASED_OUTPATIENT_CLINIC_OR_DEPARTMENT_OTHER): Payer: Self-pay | Admitting: Pulmonary Disease

## 2022-08-31 NOTE — Telephone Encounter (Signed)
Pt. Calling needing more refills for Budeson-Glycopyrrol-Formoterol (BREZTRI AEROSPHERE) 160-9-4.8 MCG/ACT AERO   she has made f/u apt.to see Dr. Debara Pickett in Sept. But she is about out of inhaler now

## 2022-09-03 MED ORDER — BREZTRI AEROSPHERE 160-9-4.8 MCG/ACT IN AERO: 2.0000 | INHALATION_SPRAY | Freq: Two times a day (BID) | RESPIRATORY_TRACT | 0 refills | Status: DC

## 2022-09-03 NOTE — Telephone Encounter (Signed)
Rx has been sent to patients pharmacy. Nothing further needed.  

## 2022-09-10 ENCOUNTER — Other Ambulatory Visit: Payer: Self-pay

## 2022-09-10 ENCOUNTER — Other Ambulatory Visit (HOSPITAL_COMMUNITY): Payer: Self-pay

## 2022-09-19 ENCOUNTER — Telehealth: Payer: Self-pay | Admitting: Pulmonary Disease

## 2022-09-19 ENCOUNTER — Other Ambulatory Visit (HOSPITAL_COMMUNITY): Payer: Self-pay

## 2022-09-19 NOTE — Telephone Encounter (Signed)
PT states she spoke to someone about a Brextri refill but when she called [facility] they have nothing on order from [place]. PT gets her RX for free under a special program. She is scared she will run out.   Please call PT to advise. [phone]

## 2022-09-24 NOTE — Telephone Encounter (Signed)
Please follow thru w/Dr. Evlyn Courier orders. Sending to [facility] because no triage in [facility].

## 2022-09-26 MED ORDER — BREZTRI AEROSPHERE 160-9-4.8 MCG/ACT IN AERO: 2.0000 | INHALATION_SPRAY | Freq: Two times a day (BID) | RESPIRATORY_TRACT | 11 refills | Status: DC

## 2022-09-26 NOTE — Telephone Encounter (Signed)
Breztri prescription sent to [facility]. Patient aware Breztri prescription has been sent.  Nothing further at this time.

## 2022-10-02 ENCOUNTER — Other Ambulatory Visit: Payer: Self-pay

## 2022-10-02 ENCOUNTER — Other Ambulatory Visit (HOSPITAL_COMMUNITY): Payer: Self-pay

## 2022-10-08 ENCOUNTER — Telehealth: Payer: Self-pay | Admitting: Pulmonary Disease

## 2022-10-08 ENCOUNTER — Other Ambulatory Visit (HOSPITAL_COMMUNITY): Payer: Self-pay

## 2022-10-09 ENCOUNTER — Other Ambulatory Visit (HOSPITAL_COMMUNITY): Payer: Self-pay

## 2022-10-09 MED ORDER — HYDROCODONE-ACETAMINOPHEN 5-325 MG PO TABS
ORAL_TABLET | Freq: Every day | ORAL | 0 refills | Status: DC
  Filled 2022-10-09: qty 40, 27d supply, fill #0

## 2022-10-10 ENCOUNTER — Other Ambulatory Visit (HOSPITAL_COMMUNITY): Payer: Self-pay

## 2022-10-10 MED ORDER — BREZTRI AEROSPHERE 160-9-4.8 MCG/ACT IN AERO: 2.0000 | INHALATION_SPRAY | Freq: Two times a day (BID) | RESPIRATORY_TRACT | 11 refills | Status: DC

## 2022-10-10 NOTE — Telephone Encounter (Signed)
Rx sent to pharmacy   

## 2022-10-15 ENCOUNTER — Other Ambulatory Visit: Payer: Self-pay

## 2022-10-15 ENCOUNTER — Telehealth (HOSPITAL_BASED_OUTPATIENT_CLINIC_OR_DEPARTMENT_OTHER): Payer: Self-pay | Admitting: Pulmonary Disease

## 2022-10-15 ENCOUNTER — Other Ambulatory Visit (HOSPITAL_COMMUNITY): Payer: Self-pay

## 2022-10-15 NOTE — Telephone Encounter (Signed)
Patient states she is still having an issue since 7/24 with picking up script as she is in their system by Rachel Chase only, where as she is listed as Rachel Chase (her legal name) in our system.   Is there any way she can get a courtesy refill or a 1 mth fill on Breztri until  [facility] have her name changed in the system? Advised patient she would have the same issue until they correct her name.   Please advise if there is anything we can do on our end as she is down to her last 4 puffs of breztri and call patient back.  Encounter from 7/24: States we need to resend Breztri to [facility] or call them by phone to refill prescription.They have her listed as Rachel Chase and not as her legal name. Patient is out of medication. Marking as urgent.

## 2022-10-16 ENCOUNTER — Other Ambulatory Visit (HOSPITAL_BASED_OUTPATIENT_CLINIC_OR_DEPARTMENT_OTHER): Payer: Self-pay

## 2022-10-16 ENCOUNTER — Other Ambulatory Visit (HOSPITAL_COMMUNITY): Payer: Self-pay

## 2022-10-16 MED ORDER — BREZTRI AEROSPHERE 160-9-4.8 MCG/ACT IN AERO: 2.0000 | INHALATION_SPRAY | Freq: Two times a day (BID) | RESPIRATORY_TRACT | Status: DC

## 2022-10-16 NOTE — Telephone Encounter (Signed)
Patient has picked up samples. Nothing further needed.

## 2022-10-16 NOTE — Telephone Encounter (Signed)
Called and spoke with patient gave patient a sample until she was able get her medication, pt will p/u at  [facility] office.

## 2022-11-06 ENCOUNTER — Encounter (HOSPITAL_BASED_OUTPATIENT_CLINIC_OR_DEPARTMENT_OTHER): Payer: Self-pay | Admitting: Pulmonary Disease

## 2022-11-06 ENCOUNTER — Ambulatory Visit (HOSPITAL_BASED_OUTPATIENT_CLINIC_OR_DEPARTMENT_OTHER): Payer: Medicare Other | Admitting: Pulmonary Disease

## 2022-11-06 VITALS — BP 98/68 | HR 69 | Resp 16 | Ht 68.0 in | Wt 184.5 lb

## 2022-11-06 DIAGNOSIS — J4489 Other specified chronic obstructive pulmonary disease: Secondary | ICD-10-CM | POA: Diagnosis not present

## 2022-11-06 DIAGNOSIS — R911 Solitary pulmonary nodule: Secondary | ICD-10-CM | POA: Diagnosis not present

## 2022-11-06 DIAGNOSIS — R490 Dysphonia: Secondary | ICD-10-CM | POA: Diagnosis not present

## 2022-11-06 DIAGNOSIS — J439 Emphysema, unspecified: Secondary | ICD-10-CM | POA: Diagnosis not present

## 2022-11-06 NOTE — Assessment & Plan Note (Signed)
Noted to be benign several years ago and appears to be unchanged in size and chest x-ray.  Unfortunately she has aged out of screening

## 2022-11-06 NOTE — Assessment & Plan Note (Signed)
We discussed effect of inhaled steroids and if persistent, consider ENT consult

## 2022-11-06 NOTE — Progress Notes (Signed)
   Subjective:    Patient ID: Rachel Chase, female    DOB: June 02, 1940, 82 y.o.   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 Wants copy of notes sent to Dr Margaretann Loveless at [facility]. Declines flu today.    Annual follow-up visit. [facility] has really worked well for her, she rarely needs albuterol inhaler. She reports some dysphonia and is concerned because her husband died of esophageal cancer.  She was finally able to get her [employer] D[employer] supplies from his DME. Interim-she was treated for pneumonia in December 2023 with antibiotics and steroids. She remains active in an assisted living facility  Chest x-ray 02/2022 showed stable left lower lobe pulmonary nodule  Significant tests/ events reviewed   CT chest without contrast 06/2017 stable left lower lobe lobulated nodule 17 x 16 mm   PFTs 05/2021, moderate to severe obstruction, ratio 55, FEV1 43%, no bronchodilator response, DLCO 12.3/56%, TLC 115%   PFTs 06/2016 severe obstruction, ratio 52, FEV1 0.89/35%, FVC 1.70/51% , FEV1 improved to 45%/1.15 with bronchodilator , TLC 106%, DLCO 9.2/30%  Review of Systems neg for any significant sore throat, dysphagia, itching, sneezing, nasal congestion or excess/ purulent secretions, fever, chills, sweats, unintended wt loss, pleuritic or exertional cp, hempoptysis, orthopnea pnd or change in chronic leg swelling. Also denies presyncope, palpitations, heartburn, abdominal pain, nausea, vomiting, diarrhea or change in bowel or urinary habits, dysuria,hematuria, rash, arthralgias, visual complaints,  headache, numbness weakness or ataxia.     Objective:   Physical Exam  Gen. Pleasant, well-nourished, in no distress ENT - no thrush, no pallor/icterus,no post nasal drip Neck: No JVD, no thyromegaly, no carotid bruits Lungs: no use of accessory muscles, no dullness to percussion, clear without rales or rhonchi  Cardiovascular: Rhythm regular, heart sounds  normal, no murmurs or gallops, no peripheral edema Musculoskeletal: No deformities, no cyanosis or clubbing         Assessment & Plan:

## 2022-11-06 NOTE — Patient Instructions (Signed)
RSV shot recommended  Can see ENT if voice issue persists

## 2022-11-06 NOTE — Assessment & Plan Note (Signed)
Continue Breztri, rinse mouth after use. Albuterol for rescue. We discussed COPD action plan and signs and symptoms of COPD exacerbation

## 2022-11-07 ENCOUNTER — Other Ambulatory Visit (HOSPITAL_COMMUNITY): Payer: Self-pay

## 2022-11-07 ENCOUNTER — Other Ambulatory Visit: Payer: Self-pay

## 2022-11-12 ENCOUNTER — Other Ambulatory Visit (HOSPITAL_COMMUNITY): Payer: Self-pay

## 2022-11-12 ENCOUNTER — Other Ambulatory Visit: Payer: Self-pay

## 2022-11-21 ENCOUNTER — Other Ambulatory Visit (HOSPITAL_COMMUNITY): Payer: Self-pay

## 2022-11-21 MED ORDER — HYDROCODONE-ACETAMINOPHEN 5-325 MG PO TABS
1.0000 | ORAL_TABLET | Freq: Every day | ORAL | 0 refills | Status: DC
  Filled 2022-11-21: qty 40, 26d supply, fill #0

## 2022-11-26 ENCOUNTER — Other Ambulatory Visit (HOSPITAL_COMMUNITY): Payer: Self-pay

## 2022-12-10 ENCOUNTER — Other Ambulatory Visit: Payer: Self-pay

## 2022-12-11 ENCOUNTER — Other Ambulatory Visit (HOSPITAL_COMMUNITY): Payer: Self-pay

## 2022-12-17 ENCOUNTER — Encounter (HOSPITAL_BASED_OUTPATIENT_CLINIC_OR_DEPARTMENT_OTHER): Payer: Self-pay | Admitting: Obstetrics & Gynecology

## 2022-12-17 ENCOUNTER — Other Ambulatory Visit (HOSPITAL_BASED_OUTPATIENT_CLINIC_OR_DEPARTMENT_OTHER): Payer: Self-pay

## 2022-12-17 ENCOUNTER — Ambulatory Visit (HOSPITAL_BASED_OUTPATIENT_CLINIC_OR_DEPARTMENT_OTHER): Payer: Medicare Other | Admitting: Obstetrics & Gynecology

## 2022-12-17 VITALS — BP 127/58 | HR 68 | Ht 66.75 in | Wt 186.2 lb

## 2022-12-17 DIAGNOSIS — N3281 Overactive bladder: Secondary | ICD-10-CM

## 2022-12-17 DIAGNOSIS — Z853 Personal history of malignant neoplasm of breast: Secondary | ICD-10-CM

## 2022-12-17 DIAGNOSIS — C50312 Malignant neoplasm of lower-inner quadrant of left female breast: Secondary | ICD-10-CM

## 2022-12-17 DIAGNOSIS — Z9189 Other specified personal risk factors, not elsewhere classified: Secondary | ICD-10-CM | POA: Diagnosis not present

## 2022-12-17 DIAGNOSIS — I2510 Atherosclerotic heart disease of native coronary artery without angina pectoris: Secondary | ICD-10-CM | POA: Diagnosis not present

## 2022-12-17 DIAGNOSIS — E559 Vitamin D deficiency, unspecified: Secondary | ICD-10-CM

## 2022-12-17 DIAGNOSIS — Z780 Asymptomatic menopausal state: Secondary | ICD-10-CM | POA: Diagnosis not present

## 2022-12-17 MED ORDER — RSVPREF3 VAC RECOMB ADJUVANTED 120 MCG/0.5ML IM SUSR
0.5000 mL | Freq: Once | INTRAMUSCULAR | 0 refills | Status: AC
Start: 2022-12-17 — End: 2022-12-18
  Filled 2022-12-17: qty 0.5, 1d supply, fill #0

## 2022-12-17 MED ORDER — GEMTESA 75 MG PO TABS
1.0000 | ORAL_TABLET | Freq: Every day | ORAL | 3 refills | Status: DC
  Filled 2022-12-17: qty 90, 90d supply, fill #0

## 2022-12-17 NOTE — Progress Notes (Signed)
82 y.o. G50P2002 Widowed White or Caucasian female here for breast and pelvic exam.  I am also following her for h/o breast cancer.  Doing well.  Denies vaginal bleeding.  Living at [facility].  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    Last mammogram 05/2022.    She went off the myrbetriq this year.  Her biggest issue is when she is getting home and can leak at that time.    Patient's last menstrual period was 02/27/2008.          Sexually active: No.  H/O STD:  no  Health Maintenance: PCP:  Dr. Margaretann Loveless.  Last wellness appt was 10/2022.  Did blood work at that appt:  yes Vaccines are up to date:  yes Colonoscopy:  2009 MMG:  06/12/2022 Negative BMD:  did with Dr. Pete Glatter Last pap smear:  04/30/2017 Negative.   H/o abnormal pap smear:  no    reports that she quit smoking about 24 years ago. Her smoking use included cigarettes. She started smoking about 69 years ago. She has a 67.5 pack-year smoking history. She has been exposed to tobacco smoke. She has never used smokeless tobacco. She reports current alcohol use. She reports that she does not use drugs.  Past Medical History:  Diagnosis Date   Anxiety    Arthritis    Back pain    Lower Back pain - takes Cortisone shots   Bone spur 2009   lower back   Breast cancer (HCC) 2015   finished radiation 12/15   Bronchitis    CAD (coronary artery disease)    a. cath 12/2010 for cardiac arrest showed    Cataract    Complication of anesthesia    throat irrition from past intubations, intubation granulomas   Coronary artery disease    Dyspnea on exertion    History of cardiac arrest 12/2010   Hyperlipemia    Hypertension    MI (myocardial infarction) (HCC) 12/2010   Was followed by Dr. 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 (followed by Sheliah Plane,  MD)   Radiation 01/06/14-02/04/14   left breast    Rosacea 2008   both eyes    Past Surgical History:  Procedure Laterality Date   ANTERIOR LAT LUMBAR FUSION N/A 11/18/2018   Procedure: Lumbar One-Two Anterolateral lumbar decompression/fusion/lateral plate fixation;  Surgeon: Barnett Abu, MD;  Location: [facility];  Service: Neurosurgery;  Laterality: N/A;  Lumbar One-Two Anterolateral lumbar decompression/fusion/lateral plate fixation   BACK SURGERY     BILIARY DILATION  05/29/2019   Procedure: BILIARY DILATION;  Surgeon: Vida Rigger, MD;  Location: [facility];  Service: Endoscopy;;   BREAST BIOPSY  8/10   fibrocystic changes   BREAST LUMPECTOMY Left 2015   BREAST LUMPECTOMY WITH NEEDLE LOCALIZATION AND AXILLARY SENTINEL LYMPH NODE BX Left 11/13/2013   Procedure: NEEDLE LOCALIZATION LEFT BREAST LUMPECTOMY WITH LEFT  AXILLARY SENTINEL LYMPH NODE BX;  Surgeon: Glenna Fellows, MD;  Location: [facility];  Service: General;  Laterality: Left;   CATARACT EXTRACTION     both   CHOLECYSTECTOMY N/A 05/30/2019   Procedure: LAPAROSCOPIC CHOLECYSTECTOMY;  Surgeon: Harriette Bouillon, MD;  Location: [facility];  Service: General;  Laterality: N/A;   CHOLECYSTECTOMY     dental implant  ERCP N/A 05/29/2019   Procedure: ENDOSCOPIC RETROGRADE CHOLANGIOPANCREATOGRAPHY (ERCP);  Surgeon: Vida Rigger, MD;  Location: [facility];  Service: Endoscopy;  Laterality: N/A;   EYE SURGERY     Laser Eye Surgery approx 2007   heart stent     failed-no stents   HYSTEROSCOPY  9/10   D&C (polyp)   LEFT HEART CATHETERIZATION WITH CORONARY ANGIOGRAM N/A 01/18/2011   Procedure: LEFT HEART CATHETERIZATION WITH CORONARY ANGIOGRAM;  Surgeon: Robynn Pane, MD;  Location: [facility];  Service: Cardiovascular;  Laterality: N/A;   PERCUTANEOUS CORONARY STENT INTERVENTION (PCI-S) N/A 01/18/2011   Procedure: PERCUTANEOUS CORONARY STENT INTERVENTION (PCI-S);  Surgeon: Robynn Pane, MD;  Location: [facility];   Service: Cardiovascular;  Laterality: N/A;   REMOVAL OF STONES  05/29/2019   Procedure: REMOVAL OF STONES;  Surgeon: Vida Rigger, MD;  Location: [facility];  Service: Endoscopy;;   SPHINCTEROTOMY  05/29/2019   Procedure: Dennison Mascot;  Surgeon: Vida Rigger, MD;  Location: [facility];  Service: Endoscopy;;   SPINAL FUSION     TONSILLECTOMY AND ADENOIDECTOMY     TUBAL LIGATION      Current Outpatient Medications  Medication Sig Dispense Refill   albuterol (VENTOLIN HFA) 108 (90 Base) MCG/ACT inhaler Inhale 1 puff into the lungs as needed.     aspirin EC 81 MG tablet Take 81 mg by mouth daily with supper.     atorvastatin (LIPITOR) 40 MG tablet Take 1 tablet (40 mg total) by mouth daily. 90 tablet 3   Budeson-Glycopyrrol-Formoterol (BREZTRI AEROSPHERE) 160-9-4.8 MCG/ACT AERO Inhale 2 puffs into the lungs in the morning and at bedtime. 10.7 g 11   Budeson-Glycopyrrol-Formoterol (BREZTRI AEROSPHERE) 160-9-4.8 MCG/ACT AERO Inhale 2 puffs into the lungs in the morning and at bedtime. 2 each    hydrochlorothiazide (HYDRODIURIL) 25 MG tablet Take 1 tablet (25 mg total) by mouth in the morning. 90 tablet 4   HYDROcodone-acetaminophen (NORCO/VICODIN) 5-325 MG tablet Take 1-1.5 tablets by mouth daily. 40 tablet 0   lisinopril (ZESTRIL) 40 MG tablet Take 1 tablet (40 mg total) by mouth daily. 90 tablet 3   LORazepam (ATIVAN) 1 MG tablet Take 1 tablet (1 mg total) by mouth at bedtime. 30 tablet 5   LORazepam (ATIVAN) 1 MG tablet Take 1 tablet (1 mg total) by mouth at bedtime as needed. 30 tablet 5   metoprolol tartrate (LOPRESSOR) 50 MG tablet Take 1 tablet (50 mg total) by mouth 2 (two) times daily. 180 tablet 3   Multiple Vitamins-Minerals (PRESERVISION AREDS 2 PO) Take 1 tablet by mouth in the morning and at bedtime.     nitroGLYCERIN (NITROSTAT) 0.4 MG SL tablet DISSOLVE ONE TABLET UNDER THE TONGUE AS NEEDED FOR CHEST PAIN 25 tablet 2   Spacer/Aero-Holding Chambers (AEROCHAMBER MV) inhaler Use as  instructed 1 each 0   triamcinolone cream (KENALOG) 0.1 % Apply topically 2 (two) times daily. 80 g 2   Vitamin D, Ergocalciferol, (DRISDOL) 1.25 MG (50000 UNIT) CAPS capsule Take 1 capsule (50,000 Units total) by mouth every 14 (fourteen) days. 6 capsule 3   No current facility-administered medications for this visit.    Family History  Problem Relation Age of Onset   Congestive Heart Failure Father    CVA Father        MI, CAD   Coronary artery disease Father    Valvular heart disease Mother    Hypertension Mother    Rectal cancer Sister    Breast cancer Sister  Breast cancer Maternal Grandmother 87   Breast cancer Maternal Aunt    Cancer Sister     Review of Systems  Constitutional: Negative.   Genitourinary: Negative.     Exam:   BP (!) 127/58 (BP Location: Right Arm, Patient Position: Sitting, Cuff Size: Large)   Pulse 68   Ht 5' 6.75" (1.695 m)   Wt 186 lb 3.2 oz (84.5 kg)   LMP 02/27/2008 Comment: tubal ligation  BMI 29.38 kg/m   Height: 5' 6.75" (169.5 cm)  General appearance: alert, cooperative and appears stated age Breasts:  right breast without masses, skin changes, nipple discharge; left breast with well healed scars, stable radiation changes, no LAD in either side Abdomen: soft, non-tender; bowel sounds normal; no masses,  no organomegaly Lymph nodes: Cervical, supraclavicular, and axillary nodes normal.  No abnormal inguinal nodes palpated Neurologic: Grossly normal  Pelvic: External genitalia:  no lesions              Urethra:  normal appearing urethra with no masses, tenderness or lesions              Bartholins and Skenes: normal                 Vagina: normal appearing vagina with atrophic changes and no discharge, no lesions              Cervix: no lesions              Pap taken: No. Bimanual Exam:  Uterus:  normal size, contour, position, consistency, mobility, non-tender              Adnexa: normal adnexa and no mass, fullness, tenderness                Rectovaginal: Confirms               Anus:  normal sphincter tone, no lesions  Chaperone, Ina Homes, CMA, was present for exam.  Assessment/Plan: 1. GYN exam for high-risk Medicare patient - Pap smear not indicated - Mammogram 05/2022 - Colonoscopy 2009.  Does not need follow up unless having new issues.   - Bone mineral density is scheduled 02/2023 with Dr. Margaretann Loveless - lab work done with PCP, Dr. Margaretann Loveless.  Is seen every 6 months - vaccines reviewed/updated.  She is going to have the RSV vaccination this year.    2. Postmenopausal - not on HRT  3. 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 Coronary artery disease involving native coronary artery of native heart without angina pectoris - has new cardiologist appt coming up  5. Vitamin D deficiency - on 50K weekly.  Needs level checked today.   6. OAB (overactive bladder) - will try gemtesa.  She stopped myrbetriq.  Pelvic PT discussed and she declines for now.   - Vibegron (GEMTESA) 75 MG TABS; Take 1 tablet (75 mg total) by mouth daily.  Dispense: 90 tablet; Refill: 3

## 2022-12-18 ENCOUNTER — Other Ambulatory Visit (HOSPITAL_BASED_OUTPATIENT_CLINIC_OR_DEPARTMENT_OTHER): Payer: Self-pay | Admitting: Obstetrics & Gynecology

## 2022-12-18 ENCOUNTER — Other Ambulatory Visit (HOSPITAL_COMMUNITY): Payer: Self-pay

## 2022-12-18 DIAGNOSIS — E559 Vitamin D deficiency, unspecified: Secondary | ICD-10-CM

## 2022-12-18 LAB — VITAMIN D 25 HYDROXY (VIT D DEFICIENCY, FRACTURES): Vit D, 25-Hydroxy: 51.1 ng/mL (ref 30.0–100.0)

## 2022-12-18 MED ORDER — VITAMIN D (ERGOCALCIFEROL) 1.25 MG (50000 UNIT) PO CAPS
50000.0000 [IU] | ORAL_CAPSULE | ORAL | 3 refills | Status: AC
  Filled 2022-12-18 – 2023-04-01 (×3): qty 6, 84d supply, fill #0
  Filled 2023-05-09 – 2023-06-11 (×2): qty 6, 84d supply, fill #1
  Filled 2023-11-11: qty 6, 84d supply, fill #2

## 2022-12-19 ENCOUNTER — Other Ambulatory Visit (HOSPITAL_COMMUNITY): Payer: Self-pay

## 2023-01-07 ENCOUNTER — Other Ambulatory Visit (HOSPITAL_COMMUNITY): Payer: Self-pay

## 2023-01-08 ENCOUNTER — Other Ambulatory Visit (HOSPITAL_COMMUNITY): Payer: Self-pay

## 2023-01-08 MED ORDER — LORAZEPAM 1 MG PO TABS
1.0000 mg | ORAL_TABLET | Freq: Every evening | ORAL | 5 refills | Status: DC | PRN
  Filled 2023-01-08: qty 30, 30d supply, fill #0
  Filled 2023-02-04: qty 30, 30d supply, fill #1
  Filled 2023-03-09: qty 30, 30d supply, fill #2
  Filled 2023-04-01 – 2023-04-08 (×3): qty 30, 30d supply, fill #3
  Filled 2023-05-07: qty 30, 30d supply, fill #4
  Filled 2023-06-07: qty 30, 30d supply, fill #5

## 2023-01-08 MED ORDER — HYDROCODONE-ACETAMINOPHEN 5-325 MG PO TABS
1.0000 | ORAL_TABLET | Freq: Every day | ORAL | 0 refills | Status: DC
  Filled 2023-01-08: qty 40, 30d supply, fill #0

## 2023-02-05 ENCOUNTER — Other Ambulatory Visit: Payer: Self-pay

## 2023-02-05 ENCOUNTER — Other Ambulatory Visit (HOSPITAL_COMMUNITY): Payer: Self-pay

## 2023-02-06 DIAGNOSIS — J449 Chronic obstructive pulmonary disease, unspecified: Secondary | ICD-10-CM | POA: Diagnosis not present

## 2023-02-06 DIAGNOSIS — M545 Low back pain, unspecified: Secondary | ICD-10-CM | POA: Diagnosis not present

## 2023-02-06 DIAGNOSIS — I10 Essential (primary) hypertension: Secondary | ICD-10-CM | POA: Diagnosis not present

## 2023-02-06 DIAGNOSIS — G894 Chronic pain syndrome: Secondary | ICD-10-CM | POA: Diagnosis not present

## 2023-03-05 ENCOUNTER — Other Ambulatory Visit (HOSPITAL_COMMUNITY): Payer: Self-pay

## 2023-03-05 MED ORDER — HYDROCODONE-ACETAMINOPHEN 5-325 MG PO TABS
1.0000 | ORAL_TABLET | Freq: Every day | ORAL | 0 refills | Status: DC
  Filled 2023-03-05: qty 40, 30d supply, fill #0

## 2023-03-07 ENCOUNTER — Other Ambulatory Visit (HOSPITAL_COMMUNITY): Payer: Self-pay

## 2023-03-07 MED ORDER — TRIAMCINOLONE ACETONIDE 0.1 % EX CREA
1.0000 | TOPICAL_CREAM | Freq: Every day | CUTANEOUS | 1 refills | Status: AC
  Filled 2023-03-07: qty 80, 80d supply, fill #0
  Filled 2023-08-13: qty 80, 30d supply, fill #1

## 2023-03-11 ENCOUNTER — Other Ambulatory Visit: Payer: Self-pay

## 2023-03-11 ENCOUNTER — Other Ambulatory Visit (HOSPITAL_COMMUNITY): Payer: Self-pay

## 2023-03-16 ENCOUNTER — Emergency Department (HOSPITAL_COMMUNITY)
Admission: EM | Admit: 2023-03-16 | Discharge: 2023-03-16 | Disposition: A | Discharge status: Home / Self Care | Payer: Medicare Other | Attending: Emergency Medicine | Admitting: Emergency Medicine

## 2023-03-16 ENCOUNTER — Encounter (HOSPITAL_COMMUNITY): Payer: Self-pay | Admitting: Emergency Medicine

## 2023-03-16 ENCOUNTER — Emergency Department (HOSPITAL_COMMUNITY): Payer: Medicare Other

## 2023-03-16 ENCOUNTER — Other Ambulatory Visit: Payer: Self-pay

## 2023-03-16 DIAGNOSIS — J9811 Atelectasis: Secondary | ICD-10-CM | POA: Diagnosis not present

## 2023-03-16 DIAGNOSIS — J101 Influenza due to other identified influenza virus with other respiratory manifestations: Secondary | ICD-10-CM | POA: Diagnosis not present

## 2023-03-16 DIAGNOSIS — I2510 Atherosclerotic heart disease of native coronary artery without angina pectoris: Secondary | ICD-10-CM | POA: Diagnosis not present

## 2023-03-16 DIAGNOSIS — R0602 Shortness of breath: Secondary | ICD-10-CM | POA: Diagnosis not present

## 2023-03-16 DIAGNOSIS — I10 Essential (primary) hypertension: Secondary | ICD-10-CM | POA: Insufficient documentation

## 2023-03-16 DIAGNOSIS — E876 Hypokalemia: Secondary | ICD-10-CM | POA: Diagnosis not present

## 2023-03-16 DIAGNOSIS — Z743 Need for continuous supervision: Secondary | ICD-10-CM | POA: Diagnosis not present

## 2023-03-16 DIAGNOSIS — Z20822 Contact with and (suspected) exposure to covid-19: Secondary | ICD-10-CM | POA: Insufficient documentation

## 2023-03-16 DIAGNOSIS — Z853 Personal history of malignant neoplasm of breast: Secondary | ICD-10-CM | POA: Diagnosis not present

## 2023-03-16 DIAGNOSIS — R062 Wheezing: Secondary | ICD-10-CM | POA: Diagnosis not present

## 2023-03-16 DIAGNOSIS — J441 Chronic obstructive pulmonary disease with (acute) exacerbation: Secondary | ICD-10-CM | POA: Diagnosis not present

## 2023-03-16 DIAGNOSIS — R059 Cough, unspecified: Secondary | ICD-10-CM | POA: Diagnosis present

## 2023-03-16 DIAGNOSIS — Z7982 Long term (current) use of aspirin: Secondary | ICD-10-CM | POA: Diagnosis not present

## 2023-03-16 DIAGNOSIS — J984 Other disorders of lung: Secondary | ICD-10-CM | POA: Diagnosis not present

## 2023-03-16 DIAGNOSIS — R110 Nausea: Secondary | ICD-10-CM | POA: Diagnosis not present

## 2023-03-16 LAB — CBC WITH DIFFERENTIAL/PLATELET
Abs Immature Granulocytes: 0.05 10*3/uL (ref 0.00–0.07)
Basophils Absolute: 0.1 10*3/uL (ref 0.0–0.1)
Basophils Relative: 1 %
Eosinophils Absolute: 0.4 10*3/uL (ref 0.0–0.5)
Eosinophils Relative: 4 %
HCT: 44 % (ref 36.0–46.0)
Hemoglobin: 14.6 g/dL (ref 12.0–15.0)
Immature Granulocytes: 1 %
Lymphocytes Relative: 5 %
Lymphs Abs: 0.5 10*3/uL — ABNORMAL LOW (ref 0.7–4.0)
MCH: 29.3 pg (ref 26.0–34.0)
MCHC: 33.2 g/dL (ref 30.0–36.0)
MCV: 88.2 fL (ref 80.0–100.0)
Monocytes Absolute: 0.8 10*3/uL (ref 0.1–1.0)
Monocytes Relative: 7 %
Neutro Abs: 8.5 10*3/uL — ABNORMAL HIGH (ref 1.7–7.7)
Neutrophils Relative %: 82 %
Platelets: 237 10*3/uL (ref 150–400)
RBC: 4.99 MIL/uL (ref 3.87–5.11)
RDW: 14.1 % (ref 11.5–15.5)
WBC: 10.2 10*3/uL (ref 4.0–10.5)
nRBC: 0 % (ref 0.0–0.2)

## 2023-03-16 LAB — BASIC METABOLIC PANEL
Anion gap: 12 (ref 5–15)
BUN: 18 mg/dL (ref 8–23)
CO2: 28 mmol/L (ref 22–32)
Calcium: 8.9 mg/dL (ref 8.9–10.3)
Chloride: 93 mmol/L — ABNORMAL LOW (ref 98–111)
Creatinine, Ser: 0.81 mg/dL (ref 0.44–1.00)
GFR, Estimated: >60 mL/min (ref >60)
Glucose, Bld: 120 mg/dL — ABNORMAL HIGH (ref 70–99)
Potassium: 2.9 mmol/L — ABNORMAL LOW (ref 3.5–5.1)
Sodium: 133 mmol/L — ABNORMAL LOW (ref 135–145)

## 2023-03-16 LAB — RESP PANEL BY RT-PCR (RSV, FLU A&B, COVID)  RVPGX2
Influenza A by PCR: POSITIVE — AB
Influenza B by PCR: NEGATIVE
Resp Syncytial Virus by PCR: NEGATIVE
SARS Coronavirus 2 by RT PCR: NEGATIVE

## 2023-03-16 MED ORDER — IPRATROPIUM-ALBUTEROL 0.5-2.5 (3) MG/3ML IN SOLN
3.0000 mL | RESPIRATORY_TRACT | Status: AC
  Administered 2023-03-16 (×3): 3 mL via RESPIRATORY_TRACT
  Filled 2023-03-16 (×3): qty 3

## 2023-03-16 MED ORDER — PREDNISONE 20 MG PO TABS
40.0000 mg | ORAL_TABLET | Freq: Every day | ORAL | 0 refills | Status: DC
  Filled 2023-03-16: qty 10, 5d supply, fill #0

## 2023-03-16 MED ORDER — METHYLPREDNISOLONE SODIUM SUCC 125 MG IJ SOLR
125.0000 mg | Freq: Once | INTRAMUSCULAR | Status: AC
  Administered 2023-03-16: 125 mg via INTRAVENOUS
  Filled 2023-03-16: qty 2

## 2023-03-16 MED ORDER — OSELTAMIVIR PHOSPHATE 75 MG PO CAPS
75.0000 mg | ORAL_CAPSULE | Freq: Two times a day (BID) | ORAL | 0 refills | Status: DC
  Filled 2023-03-16: qty 10, 5d supply, fill #0

## 2023-03-16 MED ORDER — POTASSIUM CHLORIDE CRYS ER 20 MEQ PO TBCR
40.0000 meq | EXTENDED_RELEASE_TABLET | Freq: Once | ORAL | Status: AC
  Administered 2023-03-16: 40 meq via ORAL
  Filled 2023-03-16: qty 2

## 2023-03-16 MED ORDER — PREDNISONE 20 MG PO TABS: 40.0000 mg | ORAL_TABLET | Freq: Every day | ORAL | 0 refills | Status: DC

## 2023-03-16 MED ORDER — OSELTAMIVIR PHOSPHATE 75 MG PO CAPS
75.0000 mg | ORAL_CAPSULE | Freq: Once | ORAL | Status: AC
  Administered 2023-03-16: 75 mg via ORAL
  Filled 2023-03-16: qty 1

## 2023-03-16 MED ORDER — OSELTAMIVIR PHOSPHATE 75 MG PO CAPS: 75.0000 mg | ORAL_CAPSULE | Freq: Two times a day (BID) | ORAL | 0 refills | Status: DC

## 2023-03-16 NOTE — ED Triage Notes (Signed)
Pt BIB EMS from [place], c/o productive cough, wheeze, fever, and flu-like symptoms since last night. Use rescue inhaler today with effective results.   BP 152/74 P 100 RR 18 SpO2 95% room air

## 2023-03-16 NOTE — ED Provider Notes (Signed)
[place] EMERGENCY DEPARTMENT AT [facility] Provider Note   CSN: [id number] Arrival date & time: 03/16/23  1143     History  Chief Complaint  Patient presents with   Shortness of Breath   Cough    Rachel Chase is a 83 y.o. female with PMH as listed below who presents BIB EMS from [place], c/o productive cough, wheezing, fever, and flu-like symptoms since last night. Use rescue inhaler today which helped some but her symptoms are still there.  Does have history of COPD and thinks it is related to that.  No history of sick contacts that she is aware of but does live in a retirement community.  No chest pain, nausea vomiting diarrhea, abdominal pain, leg swelling.  No history of DVT or PE. Has had prevnar and RSV vaccinations.   BP 152/74 P 100 RR 18 SpO2 95% room air    Past Medical History:  Diagnosis Date   Anxiety    Arthritis    Back pain    Lower Back pain - takes Cortisone shots   Bone spur 2009   lower back   Breast cancer (HCC) 2015   finished radiation 12/15   Bronchitis    CAD (coronary artery disease)    a. cath 12/2010 for cardiac arrest showed    Cataract    Complication of anesthesia    throat irrition from past intubations, intubation granulomas   Coronary artery disease    Dyspnea on exertion    History of cardiac arrest 12/2010   Hyperlipemia    Hypertension    MI (myocardial infarction) (HCC) 12/2010   Was followed by Dr. 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 (followed by Sheliah Plane, MD)   Radiation 01/06/14-02/04/14   left breast    Rosacea 2008   both eyes       Home Medications Prior to Admission medications   Medication Sig Start Date End Date Taking? Authorizing Provider  albuterol (VENTOLIN HFA) 108 (90 Base) MCG/ACT inhaler Inhale 1 puff into the lungs as needed. 03/05/22   [provider]  aspirin EC 81 MG  tablet Take 81 mg by mouth daily with supper.    [provider]  atorvastatin (LIPITOR) 40 MG tablet Take 1 tablet (40 mg total) by mouth daily. 08/16/22     Budeson-Glycopyrrol-Formoterol (BREZTRI AEROSPHERE) 160-9-4.8 MCG/ACT AERO Inhale 2 puffs into the lungs in the morning and at bedtime. 10/10/22   Oretha Milch, MD  Budeson-Glycopyrrol-Formoterol (BREZTRI AEROSPHERE) 160-9-4.8 MCG/ACT AERO Inhale 2 puffs into the lungs in the morning and at bedtime. 10/16/22   Oretha Milch, MD  hydrochlorothiazide (HYDRODIURIL) 25 MG tablet Take 1 tablet (25 mg total) by mouth in the morning. 08/13/22     HYDROcodone-acetaminophen (NORCO/VICODIN) 5-325 MG tablet Take 1-1.5 tablets by mouth daily. Must last 30 days. 03/05/23     lisinopril (ZESTRIL) 40 MG tablet Take 1 tablet (40 mg total) by mouth daily. 08/16/22     LORazepam (ATIVAN) 1 MG tablet Take 1 tablet (1 mg total) by mouth at bedtime. 01/11/22     LORazepam (ATIVAN) 1 MG tablet Take 1 tablet (1 mg total) by mouth at bedtime as needed. 01/08/23     metoprolol tartrate (LOPRESSOR) 50 MG tablet Take 1 tablet (50 mg total) by mouth 2 (two) times daily. 08/16/22     Multiple Vitamins-Minerals (PRESERVISION  AREDS 2 PO) Take 1 tablet by mouth in the morning and at bedtime.    [provider]  nitroGLYCERIN (NITROSTAT) 0.4 MG SL tablet DISSOLVE ONE TABLET UNDER THE TONGUE AS NEEDED FOR CHEST PAIN 12/20/21   Sharlene Dory, PA-C  Spacer/Aero-Holding Chambers (AEROCHAMBER MV) inhaler Use as instructed 05/31/21   Oretha Milch, MD  triamcinolone cream (KENALOG) 0.1 % Apply topically 2 (two) times daily. 06/18/22     triamcinolone cream (KENALOG) 0.1 % Apply 1 Application topically daily for 14 days. 03/07/23 05/26/23    Vibegron (GEMTESA) 75 MG TABS Take 1 tablet (75 mg total) by mouth daily. 12/17/22   Jerene Bears, MD  Vitamin D, Ergocalciferol, (DRISDOL) 1.25 MG (50000 UNIT) CAPS capsule Take 1 capsule (50,000 Units total) by mouth every 14  (fourteen) days. 12/18/22   Jerene Bears, MD      Allergies    Dilaudid [hydromorphone hcl] and Penicillins    Review of Systems   Review of Systems A 10 point review of systems was performed and is negative unless otherwise reported in HPI.  Physical Exam Updated Vital Signs BP (!) 155/63   Pulse (!) 125   Temp 98.5 F (36.9 C) (Oral)   Resp (!) 27   LMP 02/27/2008 Comment: tubal ligation  SpO2 98%  Physical Exam General: Normal appearing female, lying in bed.  HEENT:  Sclera anicteric, MMM, trachea midline.  Cardiology: RRR, no murmurs/rubs/gallops.  Resp: Mild tachypnea. Mildly decreased breath sounds and expiratory wheezing. Abd: Soft, non-tender, non-distended. No rebound tenderness or guarding.  GU: Deferred. MSK: No peripheral edema or signs of trauma.  Skin: warm, dry.  Neuro: A&Ox4, CNs II-XII grossly intact. MAEs. Sensation grossly intact.  Psych: Normal mood and affect.   ED Results / Procedures / Treatments   Labs (all labs ordered are listed, but only abnormal results are displayed) Labs Reviewed  RESP PANEL BY RT-PCR (RSV, FLU A&B, COVID)  RVPGX2 - Abnormal; Notable for the following components:      Result Value   Influenza A by PCR POSITIVE (*)    All other components within normal limits  CBC WITH DIFFERENTIAL/PLATELET - Abnormal; Notable for the following components:   Neutro Abs 8.5 (*)    Lymphs Abs 0.5 (*)    All other components within normal limits  BASIC METABOLIC PANEL - Abnormal; Notable for the following components:   Sodium 133 (*)    Potassium 2.9 (*)    Chloride 93 (*)    Glucose, Bld 120 (*)    All other components within normal limits  BRAIN NATRIURETIC PEPTIDE    EKG None  Radiology DG Chest Portable 1 View Result Date: 03/16/2023 CLINICAL DATA:  Shortness of breath, wheezing, cough. EXAM: PORTABLE CHEST 1 VIEW COMPARISON:  Chest radiograph dated 03/05/2022. FINDINGS: The heart size and mediastinal contours are within normal  limits. Vascular calcifications are seen in the aortic arch. There is mild bibasilar atelectasis/airspace disease. A pulmonary nodule in the left lower lobe appears similar to prior exam. No pleural effusion or pneumothorax. Degenerative changes are seen in the spine. IMPRESSION: Mild bibasilar atelectasis/airspace disease. Electronically Signed   By: Romona Curls M.D.   On: 03/16/2023 12:40    Procedures Procedures    Medications Ordered in ED Medications  ipratropium-albuterol (DUONEB) 0.5-2.5 (3) MG/3ML nebulizer solution 3 mL (3 mLs Nebulization Given 03/16/23 1504)  methylPREDNISolone sodium succinate (SOLU-MEDROL) 125 mg/2 mL injection 125 mg (125 mg Intravenous Given 03/16/23 1339)  oseltamivir (  TAMIFLU) capsule 75 mg (75 mg Oral Given 03/16/23 1352)  potassium chloride SA (KLOR-CON M) CR tablet 40 mEq (40 mEq Oral Given 03/16/23 1509)    ED Course/ Medical Decision Making/ A&P                          Medical Decision Making Amount and/or Complexity of Data Reviewed Labs: ordered. Decision-making details documented in ED Course. Radiology: ordered. Decision-making details documented in ED Course.  Risk Prescription drug management.    This patient presents to the ED for concern of SOB, wheezing, this involves an extensive number of treatment options, and is a complaint that carries with it a high risk of complications and morbidity.  I considered the following differential and admission for this acute, potentially life threatening condition.   MDM:    DDX for dyspnea includes but is not limited to:  Patient with wheezing on exam most consistent with COPD exacerbation.  Will treat with 3 DuoNebs and Solu-Medrol.  Also consider respiratory viral infection and indeed she is positive for influenza.  Will treat with Tamiflu.  Chest x-ray does not demonstrate any pneumonia, pulmonary edema and indicate heart failure, pneumothorax.  She has no asymmetric swelling to indicate DVT, no  chest pain to indicate PE.   Clinical Course as of 03/16/23 1551  Sat Mar 16, 2023  1254 DG Chest Portable 1 View Mild bibasilar atelectasis/airspace disease. [HN]  1336 Influenza A By PCR(!): POSITIVE +influenza A, will give tamiflu [HN]  1405 Potassium(!): 2.9 +hypokalemia, will replete. Does take hydrochlorothiazide at home. Could be from that or decreased oral intake. Will need to have PCP recheck in 1 week.  [HN]  1406 CBC with Differential(!) Mild left shift, otherwise unremarkable [HN]  1453 Awaiting duonebs to be given. Called RT who will come give duonebs.  [HN]    Clinical Course User Index [HN] Loetta Rough, MD    Labs: I Ordered, and personally interpreted labs.  The pertinent results include:  those listed above  Imaging Studies ordered: I ordered imaging studies including CXR I independently visualized and interpreted imaging. I agree with the radiologist interpretation  Additional history obtained from chart review.    Cardiac Monitoring: The patient was maintained on a cardiac monitor.  I personally viewed and interpreted the cardiac monitored which showed an underlying rhythm of: sinus tachycardia  Reevaluation: After the interventions noted above, I reevaluated the patient and found that they have :improved  Social Determinants of Health: Lives independently  Disposition:  Patient is signed out to the oncoming ED physician Dr. Deretha Emory who is made aware of her history, presentation, exam, workup, and plan.  Plan is to give DuoNebs and reassess.   Co morbidities that complicate the patient evaluation  Past Medical History:  Diagnosis Date   Anxiety    Arthritis    Back pain    Lower Back pain - takes Cortisone shots   Bone spur 2009   lower back   Breast cancer (HCC) 2015   finished radiation 12/15   Bronchitis    CAD (coronary artery disease)    a. cath 12/2010 for cardiac arrest showed    Cataract    Complication of anesthesia    throat  irrition from past intubations, intubation granulomas   Coronary artery disease    Dyspnea on exertion    History of cardiac arrest 12/2010   Hyperlipemia    Hypertension    MI (myocardial infarction) (  HCC) 12/2010   Was followed by Dr. 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 (followed by Sheliah Plane, MD)   Radiation 01/06/14-02/04/14   left breast    Rosacea 2008   both eyes     Medicines Meds ordered this encounter  Medications   ipratropium-albuterol (DUONEB) 0.5-2.5 (3) MG/3ML nebulizer solution 3 mL   methylPREDNISolone sodium succinate (SOLU-MEDROL) 125 mg/2 mL injection 125 mg    IV methylprednisolone will be converted to either a q12h or q24h frequency with the same total daily dose (TDD).  Ordered Dose: 1 to 125 mg TDD; convert to: TDD q24h.  Ordered Dose: 126 to 250 mg TDD; convert to: TDD div q12h.  Ordered Dose: >250 mg TDD; DAW.   oseltamivir (TAMIFLU) capsule 75 mg   potassium chloride SA (KLOR-CON M) CR tablet 40 mEq    I have reviewed the patients home medicines and have made adjustments as needed  Problem List / ED Course: Problem List Items Addressed This Visit   None Visit Diagnoses       Influenza A    -  Primary   Relevant Medications   oseltamivir (TAMIFLU) capsule 75 mg (Completed)     COPD exacerbation (HCC)       Relevant Medications   ipratropium-albuterol (DUONEB) 0.5-2.5 (3) MG/3ML nebulizer solution 3 mL (Completed)   methylPREDNISolone sodium succinate (SOLU-MEDROL) 125 mg/2 mL injection 125 mg (Completed)     Hypokalemia                       This note was created using dictation software, which may contain spelling or grammatical errors.    Loetta Rough, MD 03/16/23 [id number]

## 2023-03-16 NOTE — ED Provider Notes (Signed)
Wheezing has resolved.  Patient was able to ambulate to the bathroom.  Patient potassium down a little bit at 2.9 probably resolving nebulizer treatments.  Renal function was normal.  Patient knows she did test positive for flu.  Patient wants her prescriptions printed.  Can that way she can use a pharmacy that is closed to [place].  She did not have 1 in mind.   Vanetta Mulders, MD 03/16/23 1616

## 2023-03-16 NOTE — Discharge Instructions (Addendum)
Your influenza test was positive. We have prescribed tamiflu. You likely had a COPD exacerbation. We have prescribed steroids. Your potassium was also mildly low. Please follow up with your PCP within 1 week for lab recheck and reevaluation.  Do not hesitate to return to the ED for worsening or new/concerning symptoms.

## 2023-03-18 ENCOUNTER — Other Ambulatory Visit (HOSPITAL_COMMUNITY): Payer: Self-pay

## 2023-03-22 ENCOUNTER — Emergency Department (HOSPITAL_BASED_OUTPATIENT_CLINIC_OR_DEPARTMENT_OTHER)
Admission: EM | Admit: 2023-03-22 | Discharge: 2023-03-22 | Disposition: A | Discharge status: Home / Self Care | Payer: Medicare Other | Attending: Emergency Medicine | Admitting: Emergency Medicine

## 2023-03-22 ENCOUNTER — Other Ambulatory Visit: Payer: Self-pay

## 2023-03-22 ENCOUNTER — Emergency Department (HOSPITAL_BASED_OUTPATIENT_CLINIC_OR_DEPARTMENT_OTHER): Payer: Medicare Other

## 2023-03-22 ENCOUNTER — Encounter (HOSPITAL_BASED_OUTPATIENT_CLINIC_OR_DEPARTMENT_OTHER): Payer: Self-pay | Admitting: Urology

## 2023-03-22 DIAGNOSIS — R0602 Shortness of breath: Secondary | ICD-10-CM

## 2023-03-22 DIAGNOSIS — Z7982 Long term (current) use of aspirin: Secondary | ICD-10-CM | POA: Insufficient documentation

## 2023-03-22 DIAGNOSIS — J441 Chronic obstructive pulmonary disease with (acute) exacerbation: Secondary | ICD-10-CM | POA: Insufficient documentation

## 2023-03-22 DIAGNOSIS — Z981 Arthrodesis status: Secondary | ICD-10-CM | POA: Diagnosis not present

## 2023-03-22 LAB — BASIC METABOLIC PANEL
Anion gap: 13 (ref 5–15)
BUN: 25 mg/dL — ABNORMAL HIGH (ref 8–23)
CO2: 27 mmol/L (ref 22–32)
Calcium: 8.8 mg/dL — ABNORMAL LOW (ref 8.9–10.3)
Chloride: 97 mmol/L — ABNORMAL LOW (ref 98–111)
Creatinine, Ser: 0.91 mg/dL (ref 0.44–1.00)
GFR, Estimated: >60 mL/min (ref >60)
Glucose, Bld: 119 mg/dL — ABNORMAL HIGH (ref 70–99)
Potassium: 2.8 mmol/L — ABNORMAL LOW (ref 3.5–5.1)
Sodium: 137 mmol/L (ref 135–145)

## 2023-03-22 LAB — CBC WITH DIFFERENTIAL/PLATELET
Abs Immature Granulocytes: 0.14 10*3/uL — ABNORMAL HIGH (ref 0.00–0.07)
Basophils Absolute: 0.1 10*3/uL (ref 0.0–0.1)
Basophils Relative: 1 %
Eosinophils Absolute: 0.1 10*3/uL (ref 0.0–0.5)
Eosinophils Relative: 1 %
HCT: 43.5 % (ref 36.0–46.0)
Hemoglobin: 14.5 g/dL (ref 12.0–15.0)
Immature Granulocytes: 1 %
Lymphocytes Relative: 23 %
Lymphs Abs: 2.8 10*3/uL (ref 0.7–4.0)
MCH: 28.7 pg (ref 26.0–34.0)
MCHC: 33.3 g/dL (ref 30.0–36.0)
MCV: 86.1 fL (ref 80.0–100.0)
Monocytes Absolute: 1 10*3/uL (ref 0.1–1.0)
Monocytes Relative: 8 %
Neutro Abs: 8.1 10*3/uL — ABNORMAL HIGH (ref 1.7–7.7)
Neutrophils Relative %: 66 %
Platelets: 328 10*3/uL (ref 150–400)
RBC: 5.05 MIL/uL (ref 3.87–5.11)
RDW: 13.6 % (ref 11.5–15.5)
WBC: 12.2 10*3/uL — ABNORMAL HIGH (ref 4.0–10.5)
nRBC: 0 % (ref 0.0–0.2)

## 2023-03-22 MED ORDER — PREDNISONE 20 MG PO TABS
40.0000 mg | ORAL_TABLET | Freq: Once | ORAL | Status: AC
  Administered 2023-03-22: 40 mg via ORAL
  Filled 2023-03-22: qty 2

## 2023-03-22 MED ORDER — POTASSIUM CHLORIDE CRYS ER 20 MEQ PO TBCR: 20.0000 meq | EXTENDED_RELEASE_TABLET | Freq: Two times a day (BID) | ORAL | 0 refills | Status: DC

## 2023-03-22 MED ORDER — PREDNISONE 10 MG (21) PO TBPK: ORAL_TABLET | Freq: Every day | ORAL | 0 refills | Status: DC

## 2023-03-22 MED ORDER — POTASSIUM CHLORIDE CRYS ER 20 MEQ PO TBCR
40.0000 meq | EXTENDED_RELEASE_TABLET | Freq: Once | ORAL | Status: AC
  Administered 2023-03-22: 40 meq via ORAL
  Filled 2023-03-22: qty 2

## 2023-03-22 MED ORDER — IPRATROPIUM BROMIDE 0.02 % IN SOLN
0.5000 mg | Freq: Once | RESPIRATORY_TRACT | Status: AC
  Administered 2023-03-22: 0.5 mg via RESPIRATORY_TRACT
  Filled 2023-03-22: qty 2.5

## 2023-03-22 MED ORDER — ALBUTEROL SULFATE (2.5 MG/3ML) 0.083% IN NEBU
5.0000 mg | INHALATION_SOLUTION | Freq: Once | RESPIRATORY_TRACT | Status: AC
  Administered 2023-03-22: 5 mg via RESPIRATORY_TRACT
  Filled 2023-03-22: qty 6

## 2023-03-22 NOTE — Discharge Instructions (Signed)
Please read and follow all provided instructions.  Your diagnoses today include:  1. Shortness of breath   2. COPD exacerbation (HCC)     Tests performed today include: Chest x-ray - did not show pneumonia Blood cell counts and electrolytes: Looks stable Vital signs. See below for your results today.   Medications prescribed:  Prednisone - steroid medicine   It is best to take this medication in the morning to prevent sleeping problems. If you are diabetic, monitor your blood sugar closely and stop taking Prednisone if blood sugar is over 300. Take with food to prevent stomach upset.   Take any prescribed medications only as directed.  Home care instructions:  Follow any educational materials contained in this packet.  Follow-up instructions: Please follow-up with your primary care provider in the next 3 days for further evaluation of your symptoms and management of your COPD.   Return instructions:  Please return to the Emergency Department if you experience worsening symptoms. Please return with worsening wheezing, shortness of breath, or difficulty breathing. Return with persistent fever above 101F.  Please return if you have any other emergent concerns.  Additional Information:  Your vital signs today were: BP 139/62   Pulse 89   Temp 98.4 F (36.9 C) (Oral)   Resp (!) 25   Ht 5\' 7"  (1.702 m)   Wt 84.5 kg   LMP 02/27/2008 Comment: tubal ligation  SpO2 96%   BMI 29.18 kg/m  If your blood pressure (BP) was elevated above 135/85 this visit, please have this repeated by your doctor within one month. --------------

## 2023-03-22 NOTE — ED Triage Notes (Signed)
Pt was seen on 1/18 and was + FLUA  and had Potassium of 2.9   Also states COPD exacerbation as well  Pt states breathing has not gotten any better and chest feels so tight  States exacerbation with just a few steps  Using inhalers with no relief

## 2023-03-22 NOTE — ED Notes (Signed)
D/c paperwork reviewed with pt, including prescriptions and follow up care.  All questions and/or concerns addressed at time of d/c.  No further needs expressed. . Pt verbalized understanding, Wheeled by ED staff to ED exit, NAD.  Pt transported to [place] by neighbor, per pt request.

## 2023-03-22 NOTE — ED Notes (Signed)
Pt transported to imaging.

## 2023-03-22 NOTE — ED Notes (Signed)
Pt ambulated around the nurse's station , O2 stayed between 91-96%.

## 2023-03-22 NOTE — ED Provider Notes (Signed)
[place] [facility]MENT AT [facility] Provider Note   CSN: [id number] Arrival date & time: 03/22/23  1709     History  Chief Complaint  Patient presents with   Shortness of Breath    Rachel Chase is a 83 y.o. female.  Patient presents to the emergency department today for evaluation of shortness of breath.  She has a history of COPD.  She was seen in the emergency department 6 days ago and diagnosed with influenza.  She states that she was prescribed Tamiflu and prednisone which she has taken.  She continues to have some chest tightness, wheezing, shortness of breath with talking and with activity.  Her fever has resolved.  Cough has resolved for the most part.  She feels like she has congestion in her chest but cannot produce.  No persistent vomiting or diarrhea.  No lower extremity swelling.      Home Medications Prior to Admission medications   Medication Sig Start Date End Date Taking? Authorizing Provider  albuterol (VENTOLIN HFA) 108 (90 Base) MCG/ACT inhaler Inhale 1 puff into the lungs as needed. 03/05/22   [provider]  aspirin EC 81 MG tablet Take 81 mg by mouth daily with supper.    [provider]  atorvastatin (LIPITOR) 40 MG tablet Take 1 tablet (40 mg total) by mouth daily. 08/16/22     Budeson-Glycopyrrol-Formoterol (BREZTRI AEROSPHERE) 160-9-4.8 MCG/ACT AERO Inhale 2 puffs into the lungs in the morning and at bedtime. 10/10/22   Oretha Milch, MD  Budeson-Glycopyrrol-Formoterol (BREZTRI AEROSPHERE) 160-9-4.8 MCG/ACT AERO Inhale 2 puffs into the lungs in the morning and at bedtime. 10/16/22   Oretha Milch, MD  hydrochlorothiazide (HYDRODIURIL) 25 MG tablet Take 1 tablet (25 mg total) by mouth in the morning. 08/13/22     HYDROcodone-acetaminophen (NORCO/VICODIN) 5-325 MG tablet Take 1-1.5 tablets by mouth daily. Must last 30 days. 03/05/23     lisinopril (ZESTRIL) 40 MG tablet Take 1 tablet (40 mg total) by mouth daily. 08/16/22      LORazepam (ATIVAN) 1 MG tablet Take 1 tablet (1 mg total) by mouth at bedtime. 01/11/22     LORazepam (ATIVAN) 1 MG tablet Take 1 tablet (1 mg total) by mouth at bedtime as needed. 01/08/23     metoprolol tartrate (LOPRESSOR) 50 MG tablet Take 1 tablet (50 mg total) by mouth 2 (two) times daily. 08/16/22     Multiple Vitamins-Minerals (PRESERVISION AREDS 2 PO) Take 1 tablet by mouth in the morning and at bedtime.    [provider]  nitroGLYCERIN (NITROSTAT) 0.4 MG SL tablet DISSOLVE ONE TABLET UNDER THE TONGUE AS NEEDED FOR CHEST PAIN 12/20/21   Sharlene Dory, PA-C  oseltamivir (TAMIFLU) 75 MG capsule Take 1 capsule (75 mg total) by mouth every 12 (twelve) hours. 03/16/23   Vanetta Mulders, MD  predniSONE (DELTASONE) 20 MG tablet Take 2 tablets (40 mg total) by mouth daily for 5 days. 03/17/23 03/22/23  Vanetta Mulders, MD  Spacer/Aero-Holding Chambers (AEROCHAMBER MV) inhaler Use as instructed 05/31/21   Oretha Milch, MD  triamcinolone cream (KENALOG) 0.1 % Apply topically 2 (two) times daily. 06/18/22     triamcinolone cream (KENALOG) 0.1 % Apply 1 Application topically daily for 14 days. 03/07/23 05/26/23    Vibegron (GEMTESA) 75 MG TABS Take 1 tablet (75 mg total) by mouth daily. 12/17/22   Jerene Bears, MD  Vitamin D, Ergocalciferol, (DRISDOL) 1.25 MG (50000 UNIT) CAPS capsule Take 1 capsule (50,000 Units  total) by mouth every 14 (fourteen) days. 12/18/22   Jerene Bears, MD      Allergies    Dilaudid [hydromorphone hcl] and Penicillins    Review of Systems   Review of Systems  Physical Exam Updated Vital Signs BP 138/73   Pulse 87   Temp 98.4 F (36.9 C) (Oral)   Resp (!) 23   Ht 5\' 7"  (1.702 m)   Wt 84.5 kg   LMP 02/27/2008 Comment: tubal ligation  SpO2 96%   BMI 29.18 kg/m   Physical Exam Vitals and nursing note reviewed.  Constitutional:      Appearance: She is well-developed.  HENT:     Head: Normocephalic and atraumatic.     Jaw: No trismus.     Right  Ear: Tympanic membrane, ear canal and external ear normal.     Left Ear: Tympanic membrane, ear canal and external ear normal.     Nose: Nose normal. No mucosal edema or rhinorrhea.     Mouth/Throat:     Mouth: Mucous membranes are moist. Mucous membranes are not dry. No oral lesions.     Pharynx: Uvula midline. No oropharyngeal exudate, posterior oropharyngeal erythema or uvula swelling.     Tonsils: No tonsillar abscesses.  Eyes:     General:        Right eye: No discharge.        Left eye: No discharge.     Conjunctiva/sclera: Conjunctivae normal.  Cardiovascular:     Rate and Rhythm: Normal rate and regular rhythm.     Heart sounds: Normal heart sounds.  Pulmonary:     Effort: Pulmonary effort is normal. No respiratory distress.     Breath sounds: Normal breath sounds. No rales. Wheezes: Mild expiratory. Abdominal:     Palpations: Abdomen is soft.     Tenderness: There is no abdominal tenderness.  Musculoskeletal:     Cervical back: Normal range of motion and neck supple.  Lymphadenopathy:     Cervical: No cervical adenopathy.  Skin:    General: Skin is warm and dry.  Neurological:     Mental Status: She is alert.  Psychiatric:        Mood and Affect: Mood normal.     ED Results / Procedures / Treatments   Labs (all labs ordered are listed, but only abnormal results are displayed) Labs Reviewed  CBC WITH DIFFERENTIAL/PLATELET - Abnormal; Notable for the following components:      Result Value   WBC 12.2 (*)    Neutro Abs 8.1 (*)    Abs Immature Granulocytes 0.14 (*)    All other components within normal limits  BASIC METABOLIC PANEL - Abnormal; Notable for the following components:   Potassium 2.8 (*)    Chloride 97 (*)    Glucose, Bld 119 (*)    BUN 25 (*)    Calcium 8.8 (*)    All other components within normal limits    EKG EKG Interpretation Date/Time:  Friday March 22 2023 17:46:33 EST Ventricular Rate:  80 PR Interval:  193 QRS Duration:  92 QT  Interval:  416 QTC Calculation: 480 R Axis:   89  Text Interpretation: Sinus rhythm Borderline right axis deviation Minimal ST depression Confirmed by Anders Simmonds [phone]) on 03/22/2023 5:58:08 PM  Radiology DG Chest 2 View Result Date: 03/22/2023 CLINICAL DATA:  Shortness of breath, recent flu.  COPD exacerbation. EXAM: CHEST - 2 VIEW COMPARISON:  03/16/2023. FINDINGS: The heart size and mediastinal contours  are within normal limits. There is atherosclerotic calcification of the aorta. There is hyperinflation of the lungs. A stable 1.7 cm nodule is noted in the mid left lung, unchanged from 2019. No consolidation, effusion, or pneumothorax is seen. Degenerative changes are present in the thoracic spine. Lumbar spinal fusion hardware is noted. IMPRESSION: Stable chest with no active cardiopulmonary disease. Electronically Signed   By: Thornell Sartorius M.D.   On: 03/22/2023 19:08    Procedures Procedures    Medications Ordered in ED Medications  predniSONE (DELTASONE) tablet 40 mg (has no administration in time range)  albuterol (PROVENTIL) (2.5 MG/3ML) 0.083% nebulizer solution 5 mg (5 mg Nebulization Given 03/22/23 1758)  ipratropium (ATROVENT) nebulizer solution 0.5 mg (0.5 mg Nebulization Given 03/22/23 1758)  potassium chloride SA (KLOR-CON M) CR tablet 40 mEq (40 mEq Oral Given 03/22/23 1828)    ED Course/ Medical Decision Making/ A&P    Patient seen and examined. History obtained directly from patient.  Reviewed most recent ED visit.  Labs/EKG: Ordered CBC, BMP, EKG.  Imaging: Ordered chest x-ray.  Medications/Fluids: Ordered: Albuterol/Atrovent  Most recent vital signs reviewed and are as follows: BP 138/73   Pulse 87   Temp 98.4 F (36.9 C) (Oral)   Resp (!) 23   Ht 5\' 7"  (1.702 m)   Wt 84.5 kg   LMP 02/27/2008 Comment: tubal ligation  SpO2 96%   BMI 29.18 kg/m   Initial impression: Shortness of breath in patient with COPD, recent flu, well-appearing.  8:01 PM  Reassessment performed. Patient appears stable.  Patient ambulated and maintain normal oxygen saturation.  I personally saw her walking and she looks very comfortable.  No increased work of breathing or shortness of breath.  Labs personally reviewed and interpreted including: EKG reviewed and interpreted as above.  CBC with minimally elevated white blood cell count of 12.2; BMP with low potassium at 2.8, oral patient given, Rx for home; normal creatinine.  Imaging personally visualized and interpreted including: Chest x-ray agree negative, no pneumonia.  Reviewed pertinent lab work and imaging with patient at bedside. Questions answered.   Most current vital signs reviewed and are as follows: BP 139/62   Pulse 89   Temp 98.4 F (36.9 C) (Oral)   Resp (!) 25   Ht 5\' 7"  (1.702 m)   Wt 84.5 kg   LMP 02/27/2008 Comment: tubal ligation  SpO2 96%   BMI 29.18 kg/m   Plan: Discharge to home.  Will give first dose of prednisone tonight per patient request.  Prescriptions written for: Prednisone taper over the next 6 days  Other home care instructions discussed: Encouraged use of albuterol inhaler every 4-6 hours over the next 1 to 2 days.  ED return instructions discussed: Worsening symptoms including worsening shortness of breath, increased work of breathing, chest pain, new or worsening symptoms  Follow-up instructions discussed: Patient encouraged to follow-up with their PCP in 3 days.                                   Medical Decision Making Amount and/or Complexity of Data Reviewed Labs: ordered. Radiology: ordered.  Risk Prescription drug management.   Patient with ongoing shortness of breath after recent diagnosis of the flu.  Fevers improved.  This is all in setting of underlying COPD.  She was wheezing on arrival.  Much improved after breathing treatments here.  Labs are essentially stable.  Chest x-ray  without pneumonia.  She has ambulated well.  She looks well, speaking  in full sentences, no increased work of breathing or respiratory distress.  Feel that she is safe for discharged home with outpatient follow-up.  The patient's vital signs, pertinent lab work and imaging were reviewed and interpreted as discussed in the ED course. Hospitalization was considered for further testing, treatments, or serial exams/observation. However as patient is well-appearing, has a stable exam, and reassuring studies today, I do not feel that they warrant admission at this time. This plan was discussed with the patient who verbalizes agreement and comfort with this plan and seems reliable and able to return to the Emergency Department with worsening or changing symptoms.           Final Clinical Impression(s) / ED Diagnoses Final diagnoses:  Shortness of breath  COPD exacerbation (HCC)    Rx / DC Orders ED Discharge Orders          Ordered    predniSONE (STERAPRED UNI-PAK 21 TAB) 10 MG (21) TBPK tablet  Daily        03/22/23 1959    potassium chloride SA (KLOR-CON M) 20 MEQ tablet  2 times daily        03/22/23 2002              Renne Crigler, PA-C 03/22/23 2006    Anders Simmonds T, DO 03/22/23 2258

## 2023-03-26 ENCOUNTER — Other Ambulatory Visit: Payer: Medicare Other

## 2023-03-30 ENCOUNTER — Other Ambulatory Visit (HOSPITAL_COMMUNITY): Payer: Self-pay

## 2023-04-01 ENCOUNTER — Other Ambulatory Visit (HOSPITAL_COMMUNITY): Payer: Self-pay

## 2023-04-01 ENCOUNTER — Other Ambulatory Visit: Payer: Self-pay | Admitting: Internal Medicine

## 2023-04-02 ENCOUNTER — Other Ambulatory Visit (HOSPITAL_COMMUNITY): Payer: Self-pay

## 2023-04-03 DIAGNOSIS — I10 Essential (primary) hypertension: Secondary | ICD-10-CM | POA: Diagnosis not present

## 2023-04-03 DIAGNOSIS — E876 Hypokalemia: Secondary | ICD-10-CM | POA: Diagnosis not present

## 2023-04-03 DIAGNOSIS — J449 Chronic obstructive pulmonary disease, unspecified: Secondary | ICD-10-CM | POA: Diagnosis not present

## 2023-04-08 ENCOUNTER — Telehealth: Payer: Self-pay | Admitting: Internal Medicine

## 2023-04-08 ENCOUNTER — Other Ambulatory Visit: Payer: Self-pay

## 2023-04-08 ENCOUNTER — Other Ambulatory Visit (HOSPITAL_COMMUNITY): Payer: Self-pay

## 2023-04-08 MED ORDER — NITROGLYCERIN 0.4 MG SL SUBL
SUBLINGUAL_TABLET | SUBLINGUAL | 0 refills | Status: AC
  Filled 2023-04-08: qty 25, 10d supply, fill #0

## 2023-04-08 NOTE — Telephone Encounter (Signed)
 Pt's medication was sent to pt's pharmacy as requested. Confirmation received.

## 2023-04-08 NOTE — Telephone Encounter (Signed)
*  STAT* If patient is at the pharmacy, call can be transferred to refill team.   1. Which medications need to be refilled? (please list name of each medication and dose if known) nitroGLYCERIN  (NITROSTAT ) 0.4 MG SL tablet   2. Which pharmacy/location (including street and city if local pharmacy) is medication to be sent to?[place] [facility]    3. Do they need a 30 day or 90 day supply? 30

## 2023-04-09 ENCOUNTER — Other Ambulatory Visit (HOSPITAL_COMMUNITY): Payer: Self-pay

## 2023-04-11 ENCOUNTER — Other Ambulatory Visit (HOSPITAL_COMMUNITY): Payer: Self-pay

## 2023-04-11 MED ORDER — PREDNISONE 20 MG PO TABS
40.0000 mg | ORAL_TABLET | Freq: Every day | ORAL | 0 refills | Status: DC
  Filled 2023-04-11: qty 10, 5d supply, fill #0

## 2023-04-12 ENCOUNTER — Other Ambulatory Visit (HOSPITAL_COMMUNITY): Payer: Self-pay

## 2023-04-16 ENCOUNTER — Ambulatory Visit: Payer: Medicare Other | Attending: Internal Medicine | Admitting: Internal Medicine

## 2023-04-16 ENCOUNTER — Encounter: Payer: Self-pay | Admitting: Internal Medicine

## 2023-04-16 VITALS — BP 130/70 | HR 72 | Ht 68.5 in | Wt 184.0 lb

## 2023-04-16 DIAGNOSIS — J439 Emphysema, unspecified: Secondary | ICD-10-CM | POA: Diagnosis not present

## 2023-04-16 DIAGNOSIS — J4489 Other specified chronic obstructive pulmonary disease: Secondary | ICD-10-CM

## 2023-04-16 DIAGNOSIS — I10 Essential (primary) hypertension: Secondary | ICD-10-CM

## 2023-04-16 DIAGNOSIS — I2510 Atherosclerotic heart disease of native coronary artery without angina pectoris: Secondary | ICD-10-CM | POA: Diagnosis not present

## 2023-04-16 DIAGNOSIS — E7800 Pure hypercholesterolemia, unspecified: Secondary | ICD-10-CM

## 2023-04-16 NOTE — Progress Notes (Signed)
 Cardiology Office Note:  .   Date:  04/16/2023  ID:  Rachel Chase, DOB 03-17-1940, MRN [id number] PCP: [facility]  [facility] Providers Cardiologist:  Parke Poisson, MD    History of Present Illness: Rachel Chase is a 83 y.o. female.  Discussed the use of AI scribe software for clinical note transcription with the patient, who gave verbal consent to proceed.  History of Present Illness   The patient, with a significant past medical history of heart attack, breast cancer, back surgeries, and COPD, presents for a follow-up visit after a recent hospitalization. The patient had a flu-induced exacerbation of COPD, which required two hospital visits. The patient reports that the flu symptoms resolved quickly, but the exacerbation of COPD caused significant breathing difficulties. The patient was treated with steroids and potassium pills during both hospital visits. The patient also reports that a rescue inhaler was used, which led to a decrease in potassium levels.  After the hospital visits, the patient saw her primary care doctor, who noted some rattling in the patient's chest but overall felt the patient was doing well. However, the patient felt the need for an additional dose of steroids a few days later, which was prescribed and is currently being taken. The patient reports that her breathing has improved significantly since starting the additional dose of steroids.  The patient also has a history of heart attack, which occurred over ten years ago. The patient reports that she was told her heart looks fine now and that she doesn't believe she has any current heart problems. The patient also mentions a history of breast cancer and back surgeries but does not elaborate on these conditions during this visit.        ROS: negative except per HPI above.  Studies Reviewed: .        Results   LABS Potassium: low (03/12/2023) LDL: 65  (2024) Blood counts: normal (02/2023) Kidney function: normal (02/2023) Platelet count: normal (02/2023)  RADIOLOGY Chest X-ray: normal (03/12/2023)     Risk Assessment/Calculations:             Physical Exam:   VS:  BP 130/70 (BP Location: Left Arm, Patient Position: Sitting, Cuff Size: Normal)   Pulse 72   Ht 5' 8.5" (1.74 m)   Wt 184 lb (83.5 kg)   LMP 02/27/2008 Comment: tubal ligation  BMI 27.57 kg/m    Wt Readings from Last 3 Encounters:  04/16/23 184 lb (83.5 kg)  03/22/23 186 lb 4.6 oz (84.5 kg)  12/17/22 186 lb 3.2 oz (84.5 kg)     Physical Exam         GEN: Well nourished, well developed in no acute distress NECK: No JVD; No carotid bruits CARDIAC: RRR, no murmurs, rubs, gallops RESPIRATORY:  Clear to auscultation without rales, wheezing or rhonchi  ABDOMEN: Soft, non-tender, non-distended EXTREMITIES:  No edema; No deformity   ASSESSMENT AND PLAN: .    Assessment & Plan Essential hypertension  Pure hypercholesterolemia  Coronary artery disease involving native coronary artery of native heart without angina pectoris  COPD with chronic bronchitis and emphysema (HCC)  Coronary artery disease involving native heart without angina pectoris, unspecified vessel or lesion type   Assessment and Plan    Coronary Artery Disease CTO of RCA HTN History of cardiac arrest/heart attack. No recent cardiac symptoms reported. EKG from recent ER visit appears normal. -Continue current cardiac medications: Aspirin 81mg  daily, Atorvastatin 40mg   daily, Metoprolol 50mg  BID, Lisinopril 40mg  daily, and Hydrochlorothiazide 25mg  daily. - BP well controlled.  COPD Recent exacerbation secondary to Influenza A infection, requiring hospitalization and steroid treatment. Reports improvement in breathing after additional steroid course. -Continue Breztri as maintenance therapy. -Consider echocardiogram if shortness of breath persists after steroid course completion to ensure  no ill effects of the flu.   Hyperlipidemia LDL controlled at 65 on Atorvastatin 40mg  daily. -No changes to current therapy.

## 2023-04-16 NOTE — Patient Instructions (Signed)
 Medication Instructions:  No Changes *If you need a refill on your cardiac medications before your next appointment, please call your pharmacy*   Lab Work: None  Testing/Procedures: None   Follow-Up: At [facility], you and your health needs are our priority.  As part of our continuing mission to provide you with exceptional heart care, we have created designated Provider Care Teams.  These Care Teams include your primary Cardiologist (physician) and Advanced Practice Providers (APPs -  Physician Assistants and Nurse Practitioners) who all work together to provide you with the care you need, when you need it.  Your next appointment:   1 year(s)  Provider:   Parke Poisson, MD

## 2023-05-01 ENCOUNTER — Encounter: Payer: Self-pay | Admitting: Primary Care

## 2023-05-01 ENCOUNTER — Ambulatory Visit: Admitting: Primary Care

## 2023-05-01 ENCOUNTER — Ambulatory Visit (INDEPENDENT_AMBULATORY_CARE_PROVIDER_SITE_OTHER)

## 2023-05-01 ENCOUNTER — Other Ambulatory Visit (HOSPITAL_COMMUNITY): Payer: Self-pay

## 2023-05-01 VITALS — BP 126/62 | HR 82 | Temp 97.3°F | Ht 68.5 in | Wt 183.6 lb

## 2023-05-01 DIAGNOSIS — R0600 Dyspnea, unspecified: Secondary | ICD-10-CM | POA: Diagnosis not present

## 2023-05-01 DIAGNOSIS — J101 Influenza due to other identified influenza virus with other respiratory manifestations: Secondary | ICD-10-CM

## 2023-05-01 DIAGNOSIS — J439 Emphysema, unspecified: Secondary | ICD-10-CM | POA: Diagnosis not present

## 2023-05-01 DIAGNOSIS — J09X2 Influenza due to identified novel influenza A virus with other respiratory manifestations: Secondary | ICD-10-CM

## 2023-05-01 DIAGNOSIS — J4489 Other specified chronic obstructive pulmonary disease: Secondary | ICD-10-CM

## 2023-05-01 DIAGNOSIS — R0602 Shortness of breath: Secondary | ICD-10-CM

## 2023-05-01 DIAGNOSIS — R911 Solitary pulmonary nodule: Secondary | ICD-10-CM | POA: Diagnosis not present

## 2023-05-01 DIAGNOSIS — E876 Hypokalemia: Secondary | ICD-10-CM

## 2023-05-01 DIAGNOSIS — I700 Atherosclerosis of aorta: Secondary | ICD-10-CM | POA: Diagnosis not present

## 2023-05-01 DIAGNOSIS — J449 Chronic obstructive pulmonary disease, unspecified: Secondary | ICD-10-CM | POA: Diagnosis not present

## 2023-05-01 LAB — BASIC METABOLIC PANEL
BUN: 17 mg/dL (ref 6–23)
CO2: 31 meq/L (ref 19–32)
Calcium: 8.9 mg/dL (ref 8.4–10.5)
Chloride: 99 meq/L (ref 96–112)
Creatinine, Ser: 0.85 mg/dL (ref 0.40–1.20)
GFR: 63.56 mL/min (ref >60.00)
Glucose, Bld: 112 mg/dL — ABNORMAL HIGH (ref 70–99)
Potassium: 4.2 meq/L (ref 3.5–5.1)
Sodium: 139 meq/L (ref 135–145)

## 2023-05-01 LAB — CBC WITH DIFFERENTIAL/PLATELET
Basophils Absolute: 0.1 10*3/uL (ref 0.0–0.1)
Basophils Relative: 1 % (ref 0.0–3.0)
Eosinophils Absolute: 0.2 10*3/uL (ref 0.0–0.7)
Eosinophils Relative: 2.4 % (ref 0.0–5.0)
HCT: 38.2 % (ref 36.0–46.0)
Hemoglobin: 12.7 g/dL (ref 12.0–15.0)
Lymphocytes Relative: 13 % (ref 12.0–46.0)
Lymphs Abs: 1 10*3/uL (ref 0.7–4.0)
MCHC: 33.3 g/dL (ref 30.0–36.0)
MCV: 87.2 fl (ref 78.0–100.0)
Monocytes Absolute: 0.7 10*3/uL (ref 0.1–1.0)
Monocytes Relative: 9.3 % (ref 3.0–12.0)
Neutro Abs: 5.8 10*3/uL (ref 1.4–7.7)
Neutrophils Relative %: 74.3 % (ref 43.0–77.0)
Platelets: 305 10*3/uL (ref 150.0–400.0)
RBC: 4.38 Mil/uL (ref 3.87–5.11)
RDW: 14.9 % (ref 11.5–15.5)
WBC: 7.8 10*3/uL (ref 4.0–10.5)

## 2023-05-01 LAB — BRAIN NATRIURETIC PEPTIDE: Pro B Natriuretic peptide (BNP): 158 pg/mL — ABNORMAL HIGH (ref 0.0–100.0)

## 2023-05-01 MED ORDER — BUDESONIDE 0.5 MG/2ML IN SUSP
0.5000 mg | Freq: Two times a day (BID) | RESPIRATORY_TRACT | 1 refills | Status: AC | PRN
  Filled 2023-05-01: qty 60, 15d supply, fill #0
  Filled 2023-05-12: qty 60, 15d supply, fill #1

## 2023-05-01 MED ORDER — ALBUTEROL SULFATE HFA 108 (90 BASE) MCG/ACT IN AERS
1.0000 | INHALATION_SPRAY | Freq: Four times a day (QID) | RESPIRATORY_TRACT | 1 refills | Status: AC | PRN
Start: 2023-05-01 — End: ?
  Filled 2023-05-01: qty 6.7, 34d supply, fill #0
  Filled 2023-09-25: qty 6.7, 34d supply, fill #1

## 2023-05-01 NOTE — Progress Notes (Signed)
 @Patient  ID: Rachel Chase, female    DOB: [birth year], 83 y.o.   MRN: [id number]  Chief Complaint  Patient presents with   Follow-up    Referring provider: [facility]  HPI: 83 year old female, former smoker (60 pack year hx)  Medical history significant for hypertension, coronary artery disease, COPD with chronic bronchitis and emphysema, hyperlipidemia, solitary lung nodule, breast cancer, spinal stenosis.  Patient of Dr. Vassie Loll, last seen on 11/06/2022.  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  Patient presents today for follow-up COPD.  She has been to the emergency room twice over the last coupkle of months for respiratory issues.  She was diagnosed with influenza A on January 18, requiring her to spend 5 hours in the emergency department.  Chest x-ray showed mild bibasilar atelectasis/airspace disease.  She was given methylprednisolone as well as ipratropium albuterol nebulizer and discharged on Tamiflu/prednisone. She subsequently returned to the ED on 03/22/2023 with symptoms of shortness of breath. Exam was re-assuring, oxygen saturations were normal. CXR was negative. She was given another course of prednisone for total of 6 days. Neither times with patient admitted to the hospital for inpatient stay.   She experienced worsening shortness of breath following a recent flu infection, leading to two hospitalizations: initially for the flu in January and subsequently for a COPD exacerbation. Her symptoms improved with prednisone, but worsened after discontinuation. She has no current cough or mucus production, but experiences occasional wheezing when severely out of breath.  She uses a Breztri inhaler twice in the morning and twice in the  afternoon, and an older rescue inhaler, which she believes contributed to her low potassium levels during hospitalizations. Her current rescue inhaler is low and may be expired.  Her breathing difficulties have significantly impacted her daily activities, such as walking short distances and participating in family activities, leading to severe shortness of breath and fatigue. She attempted to participate in activities like cleaning a cemetery and attending rehearsals for a play, but had to frequently rest due to shortness of breath and a racing heart.  Her past medical history includes significant COPD with a lung function of 48% as of April 2023. She has experienced low potassium levels, attributed to the use of her rescue inhaler. No current cough, mucus production, sore throat, or nasal congestion. She reports sleeping well at night and has no leg swelling.      Significant tests/ events reviewed   CT chest without contrast 06/2017 stable left lower lobe lobulated nodule 17 x 16 mm   PFTs 05/2021, moderate to severe obstruction, ratio 55, FEV1 43%, no bronchodilator response, DLCO 12.3/56%, TLC 115%   PFTs 06/2016 severe obstruction, ratio 52, FEV1 0.89/35%, FVC 1.70/51% , FEV1 improved to 45%/1.15 with bronchodilator , TLC 106%, DLCO 9.2/30%  Allergies  Allergen Reactions   Dilaudid [Hydromorphone Hcl] Nausea And Vomiting   Penicillins Rash    Immunization History  Administered Date(s) Administered   Fluad Quad(high Dose 65+) 11/29/2021   Influenza Split 12/05/2020   Influenza-Unspecified 12/09/2014, 11/29/2022   Moderna SARS-COV2 Booster Vaccination 12/22/2019, 06/23/2020   Moderna Sars-Covid-2 Vaccination 02/24/2019, 03/24/2019, 12/22/2019, 06/15/2020   Pfizer Covid-19 Vaccine Bivalent Booster 82yrs & up 11/09/2020, 01/09/2021   Pneumococcal Conjugate-13 03/30/2013  Pneumococcal Polysaccharide-23 12/29/2001, 03/09/2009   Respiratory Syncytial Virus Vaccine,Recomb Aduvanted(Arexvy)  12/17/2022   Td 02/27/2004   Tdap 05/16/2016   Zoster Recombinant(Shingrix) 10/11/2017, 01/08/2018   Zoster, Live 01/23/2006    Past Medical History:  Diagnosis Date   Anxiety    Arthritis    Back pain    Lower Back pain - takes Cortisone shots   Bone spur 2009   lower back   Breast cancer (HCC) 2015   finished radiation 12/15   Bronchitis    CAD (coronary artery disease)    a. cath 12/2010 for cardiac arrest showed    Cataract    Complication of anesthesia    throat irrition from past intubations, intubation granulomas   Coronary artery disease    Dyspnea on exertion    History of cardiac arrest 12/2010   Hyperlipemia    Hypertension    MI (myocardial infarction) (HCC) 12/2010   Was followed by Dr. 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 (followed by Sheliah Plane, MD)   Radiation 01/06/14-02/04/14   left breast    Rosacea 2008   both eyes    Tobacco History: Social History   Tobacco Use  Smoking Status Former   Current packs/day: 0.00   Average packs/day: 1.5 packs/day for 45.0 years (67.5 ttl pk-yrs)   Types: Cigarettes   Start date: 04/17/1953   Quit date: 04/17/1998   Years since quitting: 25.0   Passive exposure: Past  Smokeless Tobacco Never  Tobacco Comments   nicorette gum   Counseling given: Not Answered Tobacco comments: nicorette gum   Outpatient Medications Prior to Visit  Medication Sig Dispense Refill   albuterol (VENTOLIN HFA) 108 (90 Base) MCG/ACT inhaler Inhale 1 puff into the lungs as needed.     aspirin EC 81 MG tablet Take 81 mg by mouth daily with supper.     atorvastatin (LIPITOR) 40 MG tablet Take 1 tablet (40 mg total) by mouth daily. 90 tablet 3   Budeson-Glycopyrrol-Formoterol (BREZTRI AEROSPHERE) 160-9-4.8 MCG/ACT AERO Inhale 2 puffs into the lungs in the morning and at bedtime. 10.7 g 11   hydrochlorothiazide (HYDRODIURIL) 25 MG  tablet Take 1 tablet (25 mg total) by mouth in the morning. 90 tablet 4   HYDROcodone-acetaminophen (NORCO/VICODIN) 5-325 MG tablet Take 1-1.5 tablets by mouth daily. Must last 30 days. 40 tablet 0   lisinopril (ZESTRIL) 40 MG tablet Take 1 tablet (40 mg total) by mouth daily. 90 tablet 3   LORazepam (ATIVAN) 1 MG tablet Take 1 tablet (1 mg total) by mouth at bedtime. 30 tablet 5   LORazepam (ATIVAN) 1 MG tablet Take 1 tablet (1 mg total) by mouth at bedtime as needed. 30 tablet 5   metoprolol tartrate (LOPRESSOR) 50 MG tablet Take 1 tablet (50 mg total) by mouth 2 (two) times daily. 180 tablet 3   Multiple Vitamins-Minerals (PRESERVISION AREDS 2 PO) Take 1 tablet by mouth in the morning and at bedtime.     nitroGLYCERIN (NITROSTAT) 0.4 MG SL tablet DISSOLVE ONE TABLET UNDER THE TONGUE AS NEEDED FOR CHEST PAIN 25 tablet 0   oseltamivir (TAMIFLU) 75 MG capsule Take 1 capsule (75 mg total) by mouth every 12 (twelve) hours. 10 capsule 0   potassium chloride SA (KLOR-CON M) 20 MEQ tablet Take 1 tablet (20 mEq total) by mouth 2 (two) times daily. 10 tablet 0   Spacer/Aero-Holding Chambers (  AEROCHAMBER MV) inhaler Use as instructed 1 each 0   triamcinolone cream (KENALOG) 0.1 % Apply topically 2 (two) times daily. 80 g 2   triamcinolone cream (KENALOG) 0.1 % Apply 1 Application topically daily for 14 days. 80 g 1   Vibegron (GEMTESA) 75 MG TABS Take 1 tablet (75 mg total) by mouth daily. 90 tablet 3   Vitamin D, Ergocalciferol, (DRISDOL) 1.25 MG (50000 UNIT) CAPS capsule Take 1 capsule (50,000 Units total) by mouth every 14 (fourteen) days. 6 capsule 3   Budeson-Glycopyrrol-Formoterol (BREZTRI AEROSPHERE) 160-9-4.8 MCG/ACT AERO Inhale 2 puffs into the lungs in the morning and at bedtime. 2 each    predniSONE (DELTASONE) 20 MG tablet Take 2 tablets (40 mg total) by mouth daily for 5 days. (Patient not taking: Reported on 05/01/2023) 10 tablet 0   predniSONE (STERAPRED UNI-PAK 21 TAB) 10 MG (21) TBPK tablet  Take by mouth daily. Take 6 tabs by mouth daily for 1 day, then 5 tabs for 1 days, then 4 tabs for 1 days, then 3 tabs for 1 days, 2 tabs for 1 days, then 1 tab by mouth daily for 1 days (Patient not taking: Reported on 05/01/2023) 21 tablet 0   No facility-administered medications prior to visit.    Review of Systems  Review of Systems  Constitutional: Negative.   HENT: Negative.    Respiratory:  Positive for shortness of breath and wheezing. Negative for cough.   Cardiovascular: Negative.    Physical Exam  BP 126/62 (BP Location: Left Arm, Patient Position: Sitting, Cuff Size: Normal)   Pulse 82   Temp (!) 97.3 F (36.3 C) (Temporal)   Ht 5' 8.5" (1.74 m)   Wt 183 lb 9.6 oz (83.3 kg)   LMP 02/27/2008 Comment: tubal ligation  SpO2 96%   BMI 27.51 kg/m  Physical Exam Constitutional:      General: She is not in acute distress.    Appearance: Normal appearance. She is not ill-appearing.  HENT:     Head: Normocephalic and atraumatic.  Cardiovascular:     Rate and Rhythm: Normal rate and regular rhythm.  Pulmonary:     Effort: Pulmonary effort is normal.     Breath sounds: No wheezing or rhonchi.     Comments: CTA, diminished  Musculoskeletal:        General: Normal range of motion.  Skin:    General: Skin is warm and dry.  Neurological:     General: No focal deficit present.     Mental Status: She is alert and oriented to person, place, and time. Mental status is at baseline.  Psychiatric:        Mood and Affect: Mood normal.        Behavior: Behavior normal.        Thought Content: Thought content normal.        Judgment: Judgment normal.      Lab Results:  CBC    Component Value Date/Time   WBC 12.2 (H) 03/22/2023 1753   RBC 5.05 03/22/2023 1753   HGB 14.5 03/22/2023 1753   HCT 43.5 03/22/2023 1753   PLT 328 03/22/2023 1753   MCV 86.1 03/22/2023 1753   MCV 94.0 01/25/2014 1718   MCH 28.7 03/22/2023 1753   MCHC 33.3 03/22/2023 1753   RDW 13.6 03/22/2023  1753   LYMPHSABS 2.8 03/22/2023 1753   MONOABS 1.0 03/22/2023 1753   EOSABS 0.1 03/22/2023 1753   BASOSABS 0.1 03/22/2023 1753    BMET  Component Value Date/Time   NA 137 03/22/2023 1753   K 2.8 (L) 03/22/2023 1753   CL 97 (L) 03/22/2023 1753   CO2 27 03/22/2023 1753   GLUCOSE 119 (H) 03/22/2023 1753   BUN 25 (H) 03/22/2023 1753   CREATININE 0.91 03/22/2023 1753   CALCIUM 8.8 (L) 03/22/2023 1753   GFRNONAA >60 03/22/2023 1753   GFRAA >60 05/31/2019 0455    BNP No results found for: "BNP"  ProBNP No results found for: "PROBNP"  Imaging: No results found.   Assessment & Plan:   1. Shortness of breath (Primary) - DG Chest 2 View; Future - CBC w/Diff; Future - Ambulatory Referral for DME - Brain natriuretic peptide; Future - Basic metabolic panel; Future - Basic metabolic panel - CBC w/Diff - Brain natriuretic peptide  2. Influenza A - DG Chest 2 View; Future - CBC w/Diff; Future - Ambulatory Referral for DME - Basic metabolic panel; Future - Basic metabolic panel - CBC w/Diff  3. COPD with chronic bronchitis and emphysema (HCC) - DG Chest 2 View; Future - CBC w/Diff; Future - Ambulatory Referral for DME - Basic metabolic panel; Future - Basic metabolic panel - CBC w/Diff      COPD Exacerbation Persistent shortness of breath following recent flu infection and multiple courses of oral prednisone. Currently on Breztri inhaler twice daily and using an albuterol rescue inhaler. No cough or mucus production. Dyspnea symptoms improve temporarily while on oral prednisone. Physical exam revealed clear lungs with no wheezing or ronchi, but slightly diminished breath sounds. -Add budesonide nebulizer 0.5mg /56ml as needed up to twice daily for shortness of breath or wheezing. -Continue Breztri inhaler twice daily. -Refill albuterol rescue inhaler, use as needed every six hours. -Order lab work to check electrolytes, including potassium. -Order chest x-ray,  results pending. -Follow-up appointment in two weeks, can be cancelled if feeling better.  Hypokalemia Low potassium levels noted during recent hospitalizations, possibly related to frequent use of albuterol rescue inhaler. -Check potassium level in today's lab work. -Consider dietary modifications or supplementation if levels remain low.         Glenford Bayley, NP 05/01/2023

## 2023-05-01 NOTE — Patient Instructions (Signed)
-  COPD EXACERBATION: COPD exacerbation means a worsening of your chronic lung condition, often triggered by infections like the flu. We have added a budesonide (pulmicort) nebulizer to use up to twice daily if you experience shortness of breath or wheezing. Continue using your Breztri inhaler twice daily and your albuterol rescue inhaler as needed every six hours. We have also ordered lab work to check your electrolytes and a chest x-ray. Please follow up in two weeks unless you feel significantly better.  -HYPOKALEMIA: Hypokalemia means low potassium levels in your blood, which can be caused by frequent use of your albuterol rescue inhaler. We will check your potassium level in today's lab work and consider dietary changes or supplements if your levels are still low  Orders: Labs CXR- done   Follow-up 2 weeks with Waynetta Sandy NP (ok to cancel if feeling better)

## 2023-05-07 ENCOUNTER — Other Ambulatory Visit (HOSPITAL_COMMUNITY): Payer: Self-pay

## 2023-05-07 ENCOUNTER — Other Ambulatory Visit: Payer: Self-pay

## 2023-05-09 ENCOUNTER — Other Ambulatory Visit: Payer: Self-pay

## 2023-05-13 ENCOUNTER — Other Ambulatory Visit (HOSPITAL_COMMUNITY): Payer: Self-pay

## 2023-05-28 ENCOUNTER — Other Ambulatory Visit (HOSPITAL_COMMUNITY): Payer: Self-pay

## 2023-05-28 DIAGNOSIS — H524 Presbyopia: Secondary | ICD-10-CM | POA: Diagnosis not present

## 2023-05-28 DIAGNOSIS — H35033 Hypertensive retinopathy, bilateral: Secondary | ICD-10-CM | POA: Diagnosis not present

## 2023-05-28 DIAGNOSIS — H353132 Nonexudative age-related macular degeneration, bilateral, intermediate dry stage: Secondary | ICD-10-CM | POA: Diagnosis not present

## 2023-05-28 DIAGNOSIS — H43813 Vitreous degeneration, bilateral: Secondary | ICD-10-CM | POA: Diagnosis not present

## 2023-05-28 DIAGNOSIS — H04123 Dry eye syndrome of bilateral lacrimal glands: Secondary | ICD-10-CM | POA: Diagnosis not present

## 2023-05-29 ENCOUNTER — Other Ambulatory Visit (HOSPITAL_COMMUNITY): Payer: Self-pay

## 2023-05-29 MED ORDER — HYDROCODONE-ACETAMINOPHEN 5-325 MG PO TABS
1.0000 | ORAL_TABLET | Freq: Every day | ORAL | 0 refills | Status: DC
Start: 2023-05-28 — End: 2023-09-12
  Filled 2023-05-29: qty 40, 30d supply, fill #0

## 2023-06-04 ENCOUNTER — Ambulatory Visit: Admitting: Primary Care

## 2023-06-04 ENCOUNTER — Encounter: Payer: Self-pay | Admitting: Primary Care

## 2023-06-04 VITALS — BP 118/58 | HR 55 | Temp 97.9°F | Ht 68.0 in | Wt 181.8 lb

## 2023-06-04 DIAGNOSIS — J4489 Other specified chronic obstructive pulmonary disease: Secondary | ICD-10-CM

## 2023-06-04 DIAGNOSIS — J439 Emphysema, unspecified: Secondary | ICD-10-CM

## 2023-06-04 NOTE — Patient Instructions (Addendum)
-  COPD EXACERBATION: A COPD exacerbation is a worsening of your chronic obstructive pulmonary disease symptoms. You experienced this after a flu-like illness. You should continue using Breztri twice daily. Stop budesonide nebulizer unless your symptoms worsen, and use your rescue albuterol inhaler as needed.  -BENIGN LUNG NODULE: A benign lung nodule is a small, non-cancerous growth in the lung. Your chest x-ray showed that the nodule is stable and does not require any immediate intervention.  -POTASSIUM MANAGEMENT: Your potassium levels are normal, so you no longer need to take potassium supplements.  Follow-up: Please schedule a follow-up appointment with Dr. Vassie Loll ([facility]) in six months for routine care / and as needed with Waynetta Sandy NP [facility]) if acute symptoms arise

## 2023-06-04 NOTE — Progress Notes (Signed)
 @Patient  ID: Rachel Chase, female    DOB: [birth year], 83 y.o.   MRN: [id number]  Chief Complaint  Patient presents with   Follow-up    Feeling better, denies sob/cough.    Referring provider: [facility]  HPI: 83 year old female, former smoker (60 pack year hx)  Medical history significant for hypertension, coronary artery disease, COPD with chronic bronchitis and emphysema, hyperlipidemia, solitary lung nodule, breast cancer, spinal stenosis.  Patient of Dr. Vassie Loll, last seen on 11/06/2022.  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  Patient presents today for follow-up COPD.  She has been to the emergency room twice over the last couple of months for respiratory issues.  She was diagnosed with influenza A on January 18, requiring her to spend 5 hours in the emergency department.  Chest x-ray showed mild bibasilar atelectasis/airspace disease.  She was given methylprednisolone as well as ipratropium albuterol nebulizer and discharged on Tamiflu/prednisone. She subsequently returned to the ED on 03/22/2023 with symptoms of shortness of breath. Exam was re-assuring, oxygen saturations were normal. CXR was negative. She was given another course of prednisone for total of 6 days. Neither times with patient admitted to the hospital for inpatient stay.   She experienced worsening shortness of breath following a recent flu infection, leading to two hospitalizations: initially for the flu in January and subsequently for a COPD exacerbation. Her symptoms improved with prednisone, but worsened after discontinuation. She has no current cough or mucus production, but experiences occasional wheezing when severely out of breath.  She uses a  Breztri inhaler twice in the morning and twice in the afternoon, and an older rescue inhaler, which she believes contributed to her low potassium levels during hospitalizations. Her current rescue inhaler is low and may be expired.  Her breathing difficulties have significantly impacted her daily activities, such as walking short distances and participating in family activities, leading to severe shortness of breath and fatigue. She attempted to participate in activities like cleaning a cemetery and attending rehearsals for a play, but had to frequently rest due to shortness of breath and a racing heart.  Her past medical history includes significant COPD with a lung function of 48% as of April 2023. She has experienced low potassium levels, attributed to the use of her rescue inhaler. No current cough, mucus production, sore throat, or nasal congestion. She reports sleeping well at night and has no leg swelling.      COPD Exacerbation Persistent shortness of breath following recent flu infection and multiple courses of oral prednisone. Currently on Breztri inhaler twice daily and using an albuterol rescue inhaler. No cough or mucus production. Dyspnea symptoms improve temporarily while on oral prednisone. Physical exam revealed clear lungs with no wheezing or ronchi, but slightly diminished breath sounds. -Add budesonide nebulizer 0.5mg /71ml as needed up to twice daily for shortness of breath or wheezing. -Continue Breztri inhaler twice daily. -Refill albuterol rescue inhaler, use as needed every six hours. -Order lab work to check electrolytes, including potassium. -Order chest x-ray, results pending. -Follow-up appointment in two weeks, can be cancelled if feeling better.   Hypokalemia Low potassium levels noted during recent hospitalizations, possibly related to frequent use of albuterol rescue inhaler. -Check potassium level in  today's lab work. -Consider dietary modifications or  supplementation if levels remain low.      06/04/2023 Discussed the use of AI scribe software for clinical note transcription with the patient, who gave verbal consent to proceed.  History of Present Illness   Rachel Chase "Joyce Gross" is an 83 year old female with COPD who presents for follow-up after an exacerbation.  She experienced a COPD exacerbation following a flu-like illness in January which exacerbated her COPD symptoms. The exacerbation significantly impacted her breathing, and recovery was slower than usual, taking about eight to ten weeks to feel better. She uses Breztri twice daily and has a rescue inhaler for occasional use. After using additional budesonide nebulizer twice daily for about two weeks, she started feeling significantly better. She consulted with her primary doctor after visiting the hospital twice, and a short course of steroids was prescribed, which helped her improve. She mentioned feeling '97% well' and used the nebulizer to gain the remaining improvement.  Her chest x-ray showed a stable, benign lung nodule that has not changed. She is no longer taking potassium supplements as her potassium levels were normal upon rechecking.      Significant tests/ events reviewed   CT chest without contrast 06/2017 stable left lower lobe lobulated nodule 17 x 16 mm   PFTs 05/2021, moderate to severe obstruction, ratio 55, FEV1 43%, no bronchodilator response, DLCO 12.3/56%, TLC 115%   PFTs 06/2016 severe obstruction, ratio 52, FEV1 0.89/35%, FVC 1.70/51% , FEV1 improved to 45%/1.15 with bronchodilator , TLC 106%, DLCO 9.2/30%  05/01/23 CXR >> Stable left midlung nodule. No acute pulmonary abnormality seen.    Allergies  Allergen Reactions   Dilaudid [Hydromorphone Hcl] Nausea And Vomiting   Penicillins Rash    Immunization History  Administered Date(s) Administered   Fluad Quad(high Dose 65+) 11/29/2021   Influenza Split 12/05/2020   Influenza-Unspecified 12/09/2014,  11/29/2022   Moderna SARS-COV2 Booster Vaccination 12/22/2019, 06/23/2020   Moderna Sars-Covid-2 Vaccination 02/24/2019, 03/24/2019, 12/22/2019, 06/15/2020   Pfizer Covid-19 Vaccine Bivalent Booster 61yrs & up 11/09/2020, 01/09/2021   Pneumococcal Conjugate-13 03/30/2013   Pneumococcal Polysaccharide-23 12/29/2001, 03/09/2009   Respiratory Syncytial Virus Vaccine,Recomb Aduvanted(Arexvy) 12/17/2022   Td 02/27/2004   Tdap 05/16/2016   Zoster Recombinant(Shingrix) 10/11/2017, 01/08/2018   Zoster, Live 01/23/2006    Past Medical History:  Diagnosis Date   Anxiety    Arthritis    Back pain    Lower Back pain - takes Cortisone shots   Bone spur 2009   lower back   Breast cancer (HCC) 2015   finished radiation 12/15   Bronchitis    CAD (coronary artery disease)    a. cath 12/2010 for cardiac arrest showed    Cataract    Complication of anesthesia    throat irrition from past intubations, intubation granulomas   Coronary artery disease    Dyspnea on exertion    History of cardiac arrest 12/2010   Hyperlipemia    Hypertension    MI (myocardial infarction) (HCC) 12/2010   Was followed by Dr. 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 (followed by Sheliah Plane, MD)   Radiation 01/06/14-02/04/14   left breast    Rosacea 2008   both eyes    Tobacco History: Social History   Tobacco Use  Smoking Status Former   Current packs/day: 0.00   Average packs/day: 1.5 packs/day for 45.0  years (67.5 ttl pk-yrs)   Types: Cigarettes   Start date: 04/17/1953   Quit date: 04/17/1998   Years since quitting: 25.1   Passive exposure: Past  Smokeless Tobacco Never  Tobacco Comments   nicorette gum   Counseling given: Not Answered Tobacco comments: nicorette gum   Outpatient Medications Prior to Visit  Medication Sig Dispense Refill   albuterol (VENTOLIN HFA) 108 (90 Base) MCG/ACT inhaler  Inhale 1 puff into the lungs every 6 (six) hours as needed. 6.7 g 1   aspirin EC 81 MG tablet Take 81 mg by mouth daily with supper.     atorvastatin (LIPITOR) 40 MG tablet Take 1 tablet (40 mg total) by mouth daily. 90 tablet 3   Budeson-Glycopyrrol-Formoterol (BREZTRI AEROSPHERE) 160-9-4.8 MCG/ACT AERO Inhale 2 puffs into the lungs in the morning and at bedtime. 10.7 g 11   budesonide (PULMICORT) 0.5 MG/2ML nebulizer solution Take 2 mLs (0.5 mg total) by nebulization 2 (two) times daily as needed. 60 mL 1   hydrochlorothiazide (HYDRODIURIL) 25 MG tablet Take 1 tablet (25 mg total) by mouth in the morning. 90 tablet 4   HYDROcodone-acetaminophen (NORCO/VICODIN) 5-325 MG tablet Take 1-1&1/2 tablets by mouth daily. Must last 30 days. 40 tablet 0   lisinopril (ZESTRIL) 40 MG tablet Take 1 tablet (40 mg total) by mouth daily. 90 tablet 3   LORazepam (ATIVAN) 1 MG tablet Take 1 tablet (1 mg total) by mouth at bedtime. 30 tablet 5   LORazepam (ATIVAN) 1 MG tablet Take 1 tablet (1 mg total) by mouth at bedtime as needed. 30 tablet 5   metoprolol tartrate (LOPRESSOR) 50 MG tablet Take 1 tablet (50 mg total) by mouth 2 (two) times daily. 180 tablet 3   Multiple Vitamins-Minerals (PRESERVISION AREDS 2 PO) Take 1 tablet by mouth in the morning and at bedtime.     nitroGLYCERIN (NITROSTAT) 0.4 MG SL tablet DISSOLVE ONE TABLET UNDER THE TONGUE AS NEEDED FOR CHEST PAIN 25 tablet 0   oseltamivir (TAMIFLU) 75 MG capsule Take 1 capsule (75 mg total) by mouth every 12 (twelve) hours. 10 capsule 0   potassium chloride SA (KLOR-CON M) 20 MEQ tablet Take 1 tablet (20 mEq total) by mouth 2 (two) times daily. 10 tablet 0   Spacer/Aero-Holding Chambers (AEROCHAMBER MV) inhaler Use as instructed 1 each 0   triamcinolone cream (KENALOG) 0.1 % Apply topically 2 (two) times daily. 80 g 2   Vibegron (GEMTESA) 75 MG TABS Take 1 tablet (75 mg total) by mouth daily. 90 tablet 3   Vitamin D, Ergocalciferol, (DRISDOL) 1.25 MG  (50000 UNIT) CAPS capsule Take 1 capsule (50,000 Units total) by mouth every 14 (fourteen) days. 6 capsule 3   No facility-administered medications prior to visit.   Review of Systems  Review of Systems  Constitutional: Negative.   HENT: Negative.    Respiratory: Negative.  Negative for cough, shortness of breath and wheezing.   Cardiovascular: Negative.    Physical Exam  LMP 02/27/2008 Comment: tubal ligation Physical Exam Constitutional:      General: She is not in acute distress.    Appearance: Normal appearance. She is not ill-appearing.  HENT:     Head: Normocephalic and atraumatic.  Cardiovascular:     Rate and Rhythm: Normal rate and regular rhythm.  Pulmonary:     Effort: Pulmonary effort is normal.     Breath sounds: Normal breath sounds. No wheezing, rhonchi or rales.  Musculoskeletal:        General:  Normal range of motion.  Skin:    General: Skin is warm and dry.  Neurological:     General: No focal deficit present.     Mental Status: She is alert and oriented to person, place, and time. Mental status is at baseline.  Psychiatric:        Mood and Affect: Mood normal.        Behavior: Behavior normal.        Thought Content: Thought content normal.        Judgment: Judgment normal.      Lab Results:  CBC    Component Value Date/Time   WBC 7.8 05/01/2023 1139   RBC 4.38 05/01/2023 1139   HGB 12.7 05/01/2023 1139   HCT 38.2 05/01/2023 1139   PLT 305.0 05/01/2023 1139   MCV 87.2 05/01/2023 1139   MCV 94.0 01/25/2014 1718   MCH 28.7 03/22/2023 1753   MCHC 33.3 05/01/2023 1139   RDW 14.9 05/01/2023 1139   LYMPHSABS 1.0 05/01/2023 1139   MONOABS 0.7 05/01/2023 1139   EOSABS 0.2 05/01/2023 1139   BASOSABS 0.1 05/01/2023 1139    BMET    Component Value Date/Time   NA 139 05/01/2023 1139   K 4.2 05/01/2023 1139   CL 99 05/01/2023 1139   CO2 31 05/01/2023 1139   GLUCOSE 112 (H) 05/01/2023 1139   BUN 17 05/01/2023 1139   CREATININE 0.85  05/01/2023 1139   CALCIUM 8.9 05/01/2023 1139   GFRNONAA >60 03/22/2023 1753   GFRAA >60 05/31/2019 0455    BNP No results found for: "BNP"  ProBNP    Component Value Date/Time   PROBNP 158.0 (H) 05/01/2023 1139    Imaging: No results found.   Assessment & Plan:   1. COPD with chronic bronchitis and emphysema (HCC) (Primary)  Assessment and Plan    COPD exacerbation Experienced prolonged exacerbation post-flu-like illness in January. Symptoms lasted 8-12 weeks. She improved with prn budesonide nebulizer twice daily during flare up. Returned to baseline. Managed with Markus [facility] [employer], prn Budesonide nebulizer and Albuterol HFA. Advised to minimize nebulizer use to reduce steroid exposure. - Continue Breztri twice daily. - Hold budesonide nebulizer unless symptoms worsen, then resume once daily as needed. - Use rescue albuterol inhaler as needed.  Benign lung nodule - Left mid-lung nodule stable and benign on chest x-ray.  Potassium management Normal potassium levels, supplement discontinued. - No potassium supplementation needed.  Pulmonary follow-up - Schedule follow-up with Dr. Vassie Loll in six months. - Contact the current provider for acute issues as needed.      Glenford Bayley, NP 06/04/2023

## 2023-06-10 ENCOUNTER — Other Ambulatory Visit (HOSPITAL_COMMUNITY): Payer: Self-pay

## 2023-06-10 ENCOUNTER — Other Ambulatory Visit: Payer: Self-pay

## 2023-06-11 ENCOUNTER — Other Ambulatory Visit (HOSPITAL_COMMUNITY): Payer: Self-pay

## 2023-06-30 DIAGNOSIS — J439 Emphysema, unspecified: Secondary | ICD-10-CM | POA: Diagnosis not present

## 2023-06-30 DIAGNOSIS — Z79899 Other long term (current) drug therapy: Secondary | ICD-10-CM | POA: Diagnosis not present

## 2023-06-30 DIAGNOSIS — R058 Other specified cough: Secondary | ICD-10-CM | POA: Diagnosis not present

## 2023-06-30 DIAGNOSIS — R6889 Other general symptoms and signs: Secondary | ICD-10-CM | POA: Diagnosis not present

## 2023-06-30 DIAGNOSIS — Z743 Need for continuous supervision: Secondary | ICD-10-CM | POA: Diagnosis not present

## 2023-06-30 DIAGNOSIS — J9602 Acute respiratory failure with hypercapnia: Secondary | ICD-10-CM | POA: Diagnosis not present

## 2023-06-30 DIAGNOSIS — R0689 Other abnormalities of breathing: Secondary | ICD-10-CM | POA: Diagnosis not present

## 2023-06-30 DIAGNOSIS — J9622 Acute and chronic respiratory failure with hypercapnia: Secondary | ICD-10-CM | POA: Diagnosis not present

## 2023-06-30 DIAGNOSIS — E785 Hyperlipidemia, unspecified: Secondary | ICD-10-CM | POA: Diagnosis not present

## 2023-06-30 DIAGNOSIS — Z20822 Contact with and (suspected) exposure to covid-19: Secondary | ICD-10-CM | POA: Diagnosis not present

## 2023-06-30 DIAGNOSIS — J441 Chronic obstructive pulmonary disease with (acute) exacerbation: Secondary | ICD-10-CM | POA: Diagnosis not present

## 2023-06-30 DIAGNOSIS — Z923 Personal history of irradiation: Secondary | ICD-10-CM | POA: Diagnosis not present

## 2023-06-30 DIAGNOSIS — R062 Wheezing: Secondary | ICD-10-CM | POA: Diagnosis not present

## 2023-06-30 DIAGNOSIS — I2510 Atherosclerotic heart disease of native coronary artery without angina pectoris: Secondary | ICD-10-CM | POA: Diagnosis not present

## 2023-06-30 DIAGNOSIS — I10 Essential (primary) hypertension: Secondary | ICD-10-CM | POA: Diagnosis not present

## 2023-06-30 DIAGNOSIS — Z79811 Long term (current) use of aromatase inhibitors: Secondary | ICD-10-CM | POA: Diagnosis not present

## 2023-06-30 DIAGNOSIS — Z87891 Personal history of nicotine dependence: Secondary | ICD-10-CM | POA: Diagnosis not present

## 2023-06-30 DIAGNOSIS — Z1721 Progesterone receptor positive status: Secondary | ICD-10-CM | POA: Diagnosis not present

## 2023-06-30 DIAGNOSIS — Z7982 Long term (current) use of aspirin: Secondary | ICD-10-CM | POA: Diagnosis not present

## 2023-06-30 DIAGNOSIS — J9601 Acute respiratory failure with hypoxia: Secondary | ICD-10-CM | POA: Diagnosis not present

## 2023-06-30 DIAGNOSIS — I739 Peripheral vascular disease, unspecified: Secondary | ICD-10-CM | POA: Diagnosis not present

## 2023-06-30 DIAGNOSIS — B348 Other viral infections of unspecified site: Secondary | ICD-10-CM | POA: Diagnosis not present

## 2023-06-30 DIAGNOSIS — Z794 Long term (current) use of insulin: Secondary | ICD-10-CM | POA: Diagnosis not present

## 2023-06-30 DIAGNOSIS — R000 Tachycardia, unspecified: Secondary | ICD-10-CM | POA: Diagnosis not present

## 2023-06-30 DIAGNOSIS — I499 Cardiac arrhythmia, unspecified: Secondary | ICD-10-CM | POA: Diagnosis not present

## 2023-06-30 DIAGNOSIS — E876 Hypokalemia: Secondary | ICD-10-CM | POA: Diagnosis not present

## 2023-06-30 DIAGNOSIS — Z9049 Acquired absence of other specified parts of digestive tract: Secondary | ICD-10-CM | POA: Diagnosis not present

## 2023-06-30 DIAGNOSIS — M4800 Spinal stenosis, site unspecified: Secondary | ICD-10-CM | POA: Diagnosis not present

## 2023-06-30 DIAGNOSIS — I517 Cardiomegaly: Secondary | ICD-10-CM | POA: Diagnosis not present

## 2023-06-30 DIAGNOSIS — R911 Solitary pulmonary nodule: Secondary | ICD-10-CM | POA: Diagnosis not present

## 2023-06-30 DIAGNOSIS — E119 Type 2 diabetes mellitus without complications: Secondary | ICD-10-CM | POA: Diagnosis not present

## 2023-06-30 DIAGNOSIS — I340 Nonrheumatic mitral (valve) insufficiency: Secondary | ICD-10-CM | POA: Diagnosis not present

## 2023-06-30 DIAGNOSIS — R918 Other nonspecific abnormal finding of lung field: Secondary | ICD-10-CM | POA: Diagnosis not present

## 2023-06-30 DIAGNOSIS — Z853 Personal history of malignant neoplasm of breast: Secondary | ICD-10-CM | POA: Diagnosis not present

## 2023-06-30 DIAGNOSIS — R0602 Shortness of breath: Secondary | ICD-10-CM | POA: Diagnosis not present

## 2023-06-30 DIAGNOSIS — I4891 Unspecified atrial fibrillation: Secondary | ICD-10-CM | POA: Diagnosis not present

## 2023-06-30 DIAGNOSIS — J9621 Acute and chronic respiratory failure with hypoxia: Secondary | ICD-10-CM | POA: Diagnosis not present

## 2023-07-01 ENCOUNTER — Other Ambulatory Visit (HOSPITAL_COMMUNITY): Payer: Self-pay

## 2023-07-09 DIAGNOSIS — R262 Difficulty in walking, not elsewhere classified: Secondary | ICD-10-CM | POA: Diagnosis not present

## 2023-07-09 DIAGNOSIS — J441 Chronic obstructive pulmonary disease with (acute) exacerbation: Secondary | ICD-10-CM | POA: Diagnosis not present

## 2023-07-09 DIAGNOSIS — R0600 Dyspnea, unspecified: Secondary | ICD-10-CM | POA: Diagnosis not present

## 2023-07-12 DIAGNOSIS — R262 Difficulty in walking, not elsewhere classified: Secondary | ICD-10-CM | POA: Diagnosis not present

## 2023-07-12 DIAGNOSIS — R0600 Dyspnea, unspecified: Secondary | ICD-10-CM | POA: Diagnosis not present

## 2023-07-12 DIAGNOSIS — J441 Chronic obstructive pulmonary disease with (acute) exacerbation: Secondary | ICD-10-CM | POA: Diagnosis not present

## 2023-07-14 ENCOUNTER — Other Ambulatory Visit (HOSPITAL_COMMUNITY): Payer: Self-pay

## 2023-07-15 ENCOUNTER — Other Ambulatory Visit (HOSPITAL_COMMUNITY): Payer: Self-pay

## 2023-07-15 MED ORDER — LORAZEPAM 1 MG PO TABS
1.0000 mg | ORAL_TABLET | Freq: Every evening | ORAL | 5 refills | Status: DC | PRN
  Filled 2023-07-15 – 2023-07-16 (×3): qty 30, 30d supply, fill #0
  Filled 2023-08-12: qty 30, 30d supply, fill #1
  Filled 2023-09-11: qty 30, 30d supply, fill #2
  Filled 2023-10-14: qty 30, 30d supply, fill #3
  Filled 2023-11-11: qty 30, 30d supply, fill #4
  Filled 2023-12-11: qty 30, 30d supply, fill #5

## 2023-07-16 ENCOUNTER — Other Ambulatory Visit (HOSPITAL_COMMUNITY): Payer: Self-pay

## 2023-07-16 ENCOUNTER — Other Ambulatory Visit: Payer: Self-pay

## 2023-07-16 DIAGNOSIS — R0600 Dyspnea, unspecified: Secondary | ICD-10-CM | POA: Diagnosis not present

## 2023-07-16 DIAGNOSIS — J441 Chronic obstructive pulmonary disease with (acute) exacerbation: Secondary | ICD-10-CM | POA: Diagnosis not present

## 2023-07-16 DIAGNOSIS — R262 Difficulty in walking, not elsewhere classified: Secondary | ICD-10-CM | POA: Diagnosis not present

## 2023-07-17 DIAGNOSIS — J441 Chronic obstructive pulmonary disease with (acute) exacerbation: Secondary | ICD-10-CM | POA: Diagnosis not present

## 2023-07-17 DIAGNOSIS — R0600 Dyspnea, unspecified: Secondary | ICD-10-CM | POA: Diagnosis not present

## 2023-07-17 DIAGNOSIS — R262 Difficulty in walking, not elsewhere classified: Secondary | ICD-10-CM | POA: Diagnosis not present

## 2023-07-18 DIAGNOSIS — J441 Chronic obstructive pulmonary disease with (acute) exacerbation: Secondary | ICD-10-CM | POA: Diagnosis not present

## 2023-07-18 DIAGNOSIS — R0600 Dyspnea, unspecified: Secondary | ICD-10-CM | POA: Diagnosis not present

## 2023-07-18 DIAGNOSIS — R262 Difficulty in walking, not elsewhere classified: Secondary | ICD-10-CM | POA: Diagnosis not present

## 2023-07-23 DIAGNOSIS — R262 Difficulty in walking, not elsewhere classified: Secondary | ICD-10-CM | POA: Diagnosis not present

## 2023-07-23 DIAGNOSIS — R0600 Dyspnea, unspecified: Secondary | ICD-10-CM | POA: Diagnosis not present

## 2023-07-23 DIAGNOSIS — J441 Chronic obstructive pulmonary disease with (acute) exacerbation: Secondary | ICD-10-CM | POA: Diagnosis not present

## 2023-07-24 DIAGNOSIS — R262 Difficulty in walking, not elsewhere classified: Secondary | ICD-10-CM | POA: Diagnosis not present

## 2023-07-24 DIAGNOSIS — R0600 Dyspnea, unspecified: Secondary | ICD-10-CM | POA: Diagnosis not present

## 2023-07-24 DIAGNOSIS — J441 Chronic obstructive pulmonary disease with (acute) exacerbation: Secondary | ICD-10-CM | POA: Diagnosis not present

## 2023-08-01 ENCOUNTER — Other Ambulatory Visit (HOSPITAL_COMMUNITY): Payer: Self-pay

## 2023-08-01 ENCOUNTER — Ambulatory Visit (HOSPITAL_BASED_OUTPATIENT_CLINIC_OR_DEPARTMENT_OTHER)
Admission: RE | Admit: 2023-08-01 | Discharge: 2023-08-01 | Disposition: A | Discharge status: Home / Self Care | Source: Ambulatory Visit | Attending: Internal Medicine | Admitting: Internal Medicine

## 2023-08-01 ENCOUNTER — Other Ambulatory Visit (HOSPITAL_BASED_OUTPATIENT_CLINIC_OR_DEPARTMENT_OTHER): Payer: Self-pay | Admitting: Internal Medicine

## 2023-08-01 DIAGNOSIS — J441 Chronic obstructive pulmonary disease with (acute) exacerbation: Secondary | ICD-10-CM | POA: Diagnosis not present

## 2023-08-01 DIAGNOSIS — R600 Localized edema: Secondary | ICD-10-CM | POA: Diagnosis not present

## 2023-08-01 DIAGNOSIS — R262 Difficulty in walking, not elsewhere classified: Secondary | ICD-10-CM | POA: Diagnosis not present

## 2023-08-01 DIAGNOSIS — M6281 Muscle weakness (generalized): Secondary | ICD-10-CM | POA: Diagnosis not present

## 2023-08-01 MED ORDER — ELIQUIS 5 MG PO TABS
5.0000 mg | ORAL_TABLET | Freq: Two times a day (BID) | ORAL | 3 refills | Status: DC
  Filled 2023-08-01: qty 60, 30d supply, fill #0
  Filled 2023-09-01: qty 60, 30d supply, fill #1
  Filled 2023-09-28: qty 60, 30d supply, fill #2
  Filled 2023-10-29: qty 60, 30d supply, fill #3

## 2023-08-02 ENCOUNTER — Other Ambulatory Visit (HOSPITAL_BASED_OUTPATIENT_CLINIC_OR_DEPARTMENT_OTHER): Payer: Self-pay

## 2023-08-02 ENCOUNTER — Telehealth (HOSPITAL_BASED_OUTPATIENT_CLINIC_OR_DEPARTMENT_OTHER): Payer: Self-pay

## 2023-08-02 MED ORDER — BREZTRI AEROSPHERE 160-9-4.8 MCG/ACT IN AERO: 2.0000 | INHALATION_SPRAY | Freq: Two times a day (BID) | RESPIRATORY_TRACT | 11 refills | Status: AC

## 2023-08-02 NOTE — Telephone Encounter (Signed)
 Copied from CRM [id number]. Topic: Clinical - Prescription Issue >> Aug 02, 2023 10:56 AM Corean Deutscher wrote: Reason for CRM: Patient called requesting a refill on her Budeson-Glycopyrrol-Formoterol  (BREZTRI  AEROSPHERE) 160-9-4.8 MCG/ACT AERO. Patient stated she gets the medication directly through Breztri . Contacted [employer] spoke with Muffie, Muffie stated patient is in medication assistance program. Patient stated she was in the hospital in May for 8 days and had a COPD flare up and has since been discharged and needs her medication.

## 2023-08-08 DIAGNOSIS — J441 Chronic obstructive pulmonary disease with (acute) exacerbation: Secondary | ICD-10-CM | POA: Diagnosis not present

## 2023-08-08 DIAGNOSIS — M6281 Muscle weakness (generalized): Secondary | ICD-10-CM | POA: Diagnosis not present

## 2023-08-08 DIAGNOSIS — R262 Difficulty in walking, not elsewhere classified: Secondary | ICD-10-CM | POA: Diagnosis not present

## 2023-08-12 ENCOUNTER — Other Ambulatory Visit: Payer: Self-pay

## 2023-08-12 ENCOUNTER — Other Ambulatory Visit (HOSPITAL_COMMUNITY): Payer: Self-pay

## 2023-08-13 ENCOUNTER — Other Ambulatory Visit (HOSPITAL_COMMUNITY): Payer: Self-pay

## 2023-08-14 DIAGNOSIS — I25119 Atherosclerotic heart disease of native coronary artery with unspecified angina pectoris: Secondary | ICD-10-CM | POA: Diagnosis not present

## 2023-08-14 DIAGNOSIS — I10 Essential (primary) hypertension: Secondary | ICD-10-CM | POA: Diagnosis not present

## 2023-08-14 DIAGNOSIS — J9611 Chronic respiratory failure with hypoxia: Secondary | ICD-10-CM | POA: Diagnosis not present

## 2023-08-14 DIAGNOSIS — I480 Paroxysmal atrial fibrillation: Secondary | ICD-10-CM | POA: Diagnosis not present

## 2023-08-14 DIAGNOSIS — Z0000 Encounter for general adult medical examination without abnormal findings: Secondary | ICD-10-CM | POA: Diagnosis not present

## 2023-08-14 DIAGNOSIS — E7800 Pure hypercholesterolemia, unspecified: Secondary | ICD-10-CM | POA: Diagnosis not present

## 2023-08-14 DIAGNOSIS — G894 Chronic pain syndrome: Secondary | ICD-10-CM | POA: Diagnosis not present

## 2023-08-14 DIAGNOSIS — E559 Vitamin D deficiency, unspecified: Secondary | ICD-10-CM | POA: Diagnosis not present

## 2023-08-14 DIAGNOSIS — D6869 Other thrombophilia: Secondary | ICD-10-CM | POA: Diagnosis not present

## 2023-08-14 DIAGNOSIS — J449 Chronic obstructive pulmonary disease, unspecified: Secondary | ICD-10-CM | POA: Diagnosis not present

## 2023-08-14 DIAGNOSIS — M545 Low back pain, unspecified: Secondary | ICD-10-CM | POA: Diagnosis not present

## 2023-08-22 ENCOUNTER — Other Ambulatory Visit (HOSPITAL_COMMUNITY): Payer: Self-pay

## 2023-08-22 MED ORDER — LISINOPRIL 40 MG PO TABS
40.0000 mg | ORAL_TABLET | Freq: Every day | ORAL | 3 refills | Status: AC
  Filled 2023-08-22: qty 90, 90d supply, fill #0
  Filled 2023-11-21: qty 90, 90d supply, fill #1
  Filled 2023-11-22: qty 90, 90d supply, fill #0
  Filled 2024-02-22: qty 90, 90d supply, fill #1

## 2023-08-22 MED ORDER — ATORVASTATIN CALCIUM 40 MG PO TABS
40.0000 mg | ORAL_TABLET | Freq: Every day | ORAL | 3 refills | Status: AC
  Filled 2023-08-22: qty 90, 90d supply, fill #0
  Filled 2023-11-21: qty 90, 90d supply, fill #1
  Filled 2023-11-22 (×2): qty 90, 90d supply, fill #0
  Filled 2024-02-22: qty 90, 90d supply, fill #1

## 2023-08-26 ENCOUNTER — Other Ambulatory Visit (HOSPITAL_COMMUNITY): Payer: Self-pay

## 2023-09-02 ENCOUNTER — Other Ambulatory Visit (HOSPITAL_COMMUNITY): Payer: Self-pay

## 2023-09-03 ENCOUNTER — Telehealth (HOSPITAL_BASED_OUTPATIENT_CLINIC_OR_DEPARTMENT_OTHER): Payer: Self-pay

## 2023-09-03 NOTE — Telephone Encounter (Signed)
 Copied from CRM [id number]. Topic: General - Other >> Sep 03, 2023 11:46 AM [name] wrote: Reason for CRM:  Patient called regarding her Bretzi. Patient states she needs a refill, however, she is a part of the [employer] and there is form that goes over so she don't have to pay for her med. Patient asked if she can come into the office tomorrow and print the form the provider to sign. She states sometimes we just send the form for her. Patient would like to know IF it is okay for her to come into the office for the form as she is afraid of running out of the med. She states her neighbor gave her a sample and it is only for 20 days and she takes 2 puffs a day.  I did not put this through as a refill as I am unsure if this follows the regular process. Patient says she is in a special program and needs the form.    Please call patient.  She stated it is okay to leave a detailed message if she misses the call. (Wants to know if she should stop by tomorrow. Patient may need a sample) ----------------------------------------------------------------------- From previous Reason for Contact - Prescription Issue: Reason for CRM:

## 2023-09-11 ENCOUNTER — Other Ambulatory Visit (HOSPITAL_COMMUNITY): Payer: Self-pay

## 2023-09-11 ENCOUNTER — Other Ambulatory Visit: Payer: Self-pay

## 2023-09-12 ENCOUNTER — Other Ambulatory Visit (HOSPITAL_COMMUNITY): Payer: Self-pay

## 2023-09-12 MED ORDER — HYDROCODONE-ACETAMINOPHEN 5-325 MG PO TABS
1.0000 | ORAL_TABLET | Freq: Every day | ORAL | 0 refills | Status: DC
  Filled 2023-09-12: qty 40, 30d supply, fill #0

## 2023-09-25 ENCOUNTER — Telehealth: Payer: Self-pay | Admitting: Internal Medicine

## 2023-09-25 ENCOUNTER — Other Ambulatory Visit (HOSPITAL_COMMUNITY): Payer: Self-pay

## 2023-09-25 NOTE — Telephone Encounter (Signed)
 Pt returned call.. not sure what happened. Pt never came back to the phone.

## 2023-09-25 NOTE — Telephone Encounter (Signed)
 Pt c/o swelling: STAT is pt has developed SOB within 24 hours  How much weight have you gained and in what time span? Unsure  If swelling, where is the swelling located? Right leg and foot   Are you currently taking a fluid pill? No  Are you currently SOB? A little   Do you have a log of your daily weights (if so, list)? No  Have you gained 3 pounds in a day or 5 pounds in a week? Unsure  Have you traveled recently? No

## 2023-09-25 NOTE — Telephone Encounter (Signed)
 Spoke to patient she stated she was recently in [facility] with new Afib.Stated PCP advised her to see Dr.Acharya.Advised Dr.Archarya does not have any openings Stated she is having swelling in right lower leg.She had a recent normal doppler.Advised to decrease salt.Continue to wear compression hose.Keep legs elevated when sitting. Appointment scheduled with Rosaline Bane NP 8/7 at 1:55 pm at [facility].I will make Dr.Acharya aware.

## 2023-09-25 NOTE — Telephone Encounter (Signed)
 Attempted to call pt twice. Pt answered first time and then hung up. Called again and line went to voicemail. Left message for pt to call back 7/30

## 2023-09-29 ENCOUNTER — Other Ambulatory Visit (HOSPITAL_COMMUNITY): Payer: Self-pay

## 2023-09-30 ENCOUNTER — Other Ambulatory Visit: Payer: Self-pay

## 2023-09-30 ENCOUNTER — Other Ambulatory Visit (HOSPITAL_COMMUNITY): Payer: Self-pay

## 2023-09-30 MED ORDER — TRIAMTERENE-HCTZ 37.5-25 MG PO TABS
1.0000 | ORAL_TABLET | Freq: Every morning | ORAL | 3 refills | Status: AC
  Filled 2023-09-30: qty 90, 90d supply, fill #0
  Filled 2023-12-23: qty 90, 90d supply, fill #1
  Filled 2023-12-24: qty 90, 90d supply, fill #0
  Filled 2024-03-24: qty 90, 90d supply, fill #1

## 2023-09-30 MED ORDER — DILTIAZEM HCL ER COATED BEADS 180 MG PO CP24
180.0000 mg | ORAL_CAPSULE | Freq: Every day | ORAL | 2 refills | Status: DC
  Filled 2023-09-30: qty 30, 30d supply, fill #0
  Filled 2023-10-29: qty 30, 30d supply, fill #1
  Filled 2023-11-21: qty 30, 30d supply, fill #2
  Filled 2023-11-22: qty 30, 30d supply, fill #0

## 2023-09-30 MED ORDER — METOPROLOL SUCCINATE ER 200 MG PO TB24
200.0000 mg | ORAL_TABLET | Freq: Every day | ORAL | 2 refills | Status: DC
  Filled 2023-09-30: qty 30, 30d supply, fill #0

## 2023-10-01 ENCOUNTER — Other Ambulatory Visit (HOSPITAL_COMMUNITY): Payer: Self-pay

## 2023-10-03 ENCOUNTER — Ambulatory Visit (HOSPITAL_BASED_OUTPATIENT_CLINIC_OR_DEPARTMENT_OTHER): Admitting: Nurse Practitioner

## 2023-10-03 ENCOUNTER — Other Ambulatory Visit (HOSPITAL_BASED_OUTPATIENT_CLINIC_OR_DEPARTMENT_OTHER): Payer: Self-pay

## 2023-10-03 ENCOUNTER — Encounter (HOSPITAL_BASED_OUTPATIENT_CLINIC_OR_DEPARTMENT_OTHER): Payer: Self-pay | Admitting: Nurse Practitioner

## 2023-10-03 VITALS — BP 112/56 | HR 67 | Ht 68.0 in | Wt 185.2 lb

## 2023-10-03 DIAGNOSIS — Z7901 Long term (current) use of anticoagulants: Secondary | ICD-10-CM

## 2023-10-03 DIAGNOSIS — J439 Emphysema, unspecified: Secondary | ICD-10-CM

## 2023-10-03 DIAGNOSIS — I4891 Unspecified atrial fibrillation: Secondary | ICD-10-CM

## 2023-10-03 DIAGNOSIS — E785 Hyperlipidemia, unspecified: Secondary | ICD-10-CM | POA: Diagnosis not present

## 2023-10-03 DIAGNOSIS — I2510 Atherosclerotic heart disease of native coronary artery without angina pectoris: Secondary | ICD-10-CM | POA: Diagnosis not present

## 2023-10-03 DIAGNOSIS — M7989 Other specified soft tissue disorders: Secondary | ICD-10-CM | POA: Diagnosis not present

## 2023-10-03 DIAGNOSIS — J4489 Other specified chronic obstructive pulmonary disease: Secondary | ICD-10-CM | POA: Diagnosis not present

## 2023-10-03 DIAGNOSIS — I4819 Other persistent atrial fibrillation: Secondary | ICD-10-CM | POA: Diagnosis not present

## 2023-10-03 MED ORDER — FUROSEMIDE 20 MG PO TABS
20.0000 mg | ORAL_TABLET | ORAL | 3 refills | Status: AC | PRN
  Filled 2023-10-03: qty 10, 10d supply, fill #0

## 2023-10-03 NOTE — Patient Instructions (Addendum)
 Medication Instructions:   START Lasix  one (1) tablet by mouth ( 20 mg) daily as needed for leg swelling.   *If you need a refill on your cardiac medications before your next appointment, please call your pharmacy*  Lab Work:  TODAY!! BMET  If you have labs (blood work) drawn today and your tests are completely normal, you will receive your results only by: MyChart Message (if you have MyChart) OR A paper copy in the mail If you have any lab test that is abnormal or we need to change your treatment, we will call you to review the results.  Testing/Procedures:  None ordered.  Follow-Up: At [facility], you and your health needs are our priority.  As part of our continuing mission to provide you with exceptional heart care, our providers are all part of one team.  This team includes your primary Cardiologist (physician) and Advanced Practice Providers or APPs (Physician Assistants and Nurse Practitioners) who all work together to provide you with the care you need, when you need it.  Your next appointment:   6 month(s)  Provider:   Gayatri A Acharya, MD    We recommend signing up for the patient portal called MyChart.  Sign up information is provided on this After Visit Summary.  MyChart is used to connect with patients for Virtual Visits (Telemedicine).  Patients are able to view lab/test results, encounter notes, upcoming appointments, etc.  Non-urgent messages can be sent to your provider as well.   To learn more about what you can do with MyChart, go to [email or web address].   Other Instructions  Your physician wants you to follow-up in: 6 months.  You will receive a reminder letter in the mail two months in advance. If you don't receive a letter, please call our office to schedule the follow-up appointment.    1st Floor: - Lobby - Registration  - Pharmacy  - Lab - Cafe  2nd Floor: - PV Lab - Diagnostic Testing (echo, CT, nuclear med)  3rd Floor: -  Vacant  4th Floor: - TCTS (cardiothoracic surgery) - AFib Clinic - Structural Heart Clinic - Vascular Surgery  - Vascular Ultrasound  5th Floor: - [facility] (general and EP) - Clinical Pharmacy for coumadin, hypertension, lipid, weight-loss medications, and med management appointments    Valet parking services will be available as well.        For your  leg edema you  should do  the following 1. Leg elevation - I recommend the Lounge Dr. Leg rest.  See below for details  2. Salt restriction  -  Use potassium chloride  instead of regular salt as a salt substitute. 3. Walk regularly 4. Compression hose - Medical Supply store  5. Weight loss    Available on [email or web address] Or  Go to [email or web address]

## 2023-10-03 NOTE — Progress Notes (Signed)
 Cardiology Office Note   Date:  10/04/2023  ID:  Janeka, Libman Jan 07, 1941, MRN [id number] PCP: Dwight Trula SQUIBB, MD  [facility] Providers Cardiologist:  Soyla DELENA Merck, MD     PMH COPD CAD CTO of RCA  S/p cardiac arrest in 2012 Hypertension Hyperlipidemia LLL lung nodule/mass Breast cancer Former tobacco abuse  Cardiac catheterization 01/18/2011: Left main Short but patent.  LAD 20 to 30% proximal and 25 to 35% mid sequential stenosis.  Left circumflex proximal 30% stenosis.  OM 1 and 2 are very small.  OM 3 is large which is patent.  RCA 100% occlusion with large thrombus burden filling distally by collaterals from left system.  History of lexiscan  myoview  in 2018, low risk with no evidence of prior infarct or ischemia. Echo 07/2016 with normal EF 55-60%, G1DD, mild MR.   Last cardiology clinic visit was 04/16/23 with Dr. Merck.  She had previously been seen by Dr. Claudene and Dr. Hobart.  She reported recent exacerbation of COPD requiring 2 hospital visits.  No concerning cardiac symptoms. One year follow-up was recommended.  History of Present Illness Discussed the use of AI scribe software for clinical note transcription with the patient, who gave verbal consent to proceed.  History of Present Illness Rachel Chase is a very pleasant 83 year old female who is here today for management of  atrial fibrillation. In May, she was hospitalized for a COPD exacerbation and diagnosed with atrial fibrillation, for which she started on Eliquis . She has not experienced palpitations or changes in heart rate since the diagnosis. Her oxygen saturation levels remain between 95 and 98%, and she has not used supplemental oxygen beyond the first week post-discharge. She experiences right leg swelling, which is not painful but affects her ability to wear regular shoes. She uses compression stockings with zippers to manage the swelling, as tighter stockings previously caused  fluid leakage. She completed three weeks of physical therapy and feels steady, using a cane for safety. She is ready to resume exercise classes and has not experienced any falls. Her past medical history includes a myocardial infarction in 2012. She wants to take a stronger fluid pill to help with leg swelling. She denies chest pain, shortness of breath, orthopnea, PND, presyncope, syncope or palpitations.   ROS: See HPI  Studies Reviewed EKG Interpretation Date/Time:  Thursday October 03 2023 14:52:18 EDT Ventricular Rate:  46 PR Interval:    QRS Duration:  80 QT Interval:  448 QTC Calculation: 392 R Axis:   83  Text Interpretation: Atrial fibrillation with slow ventricular response Low voltage QRS Septal infarct , age undetermined When compared with ECG of 22-Mar-2023 17:46, PREVIOUS ECG IS PRESENT Confirmed by Percy Browning [phone]) on 10/03/2023 3:06:34 PM     No results found for: LIPOA  Risk Assessment/Calculations  CHA2DS2-VASc Score = 5   This indicates a 7.2% annual risk of stroke. The patient's score is based upon: CHF History: 0 HTN History: 1 Diabetes History: 0 Stroke History: 0 Vascular Disease History: 1 Age Score: 2 Gender Score: 1            Physical Exam VS:  BP (!) 112/56 (BP Location: Right Arm, Patient Position: Sitting, Cuff Size: Normal)   Pulse 67   Ht 5' 8 (1.727 m)   Wt 185 lb 3.2 oz (84 kg)   LMP 02/27/2008 Comment: tubal ligation  SpO2 98%   BMI 28.16 kg/m    Wt Readings from Last 3  Encounters:  10/03/23 185 lb 3.2 oz (84 kg)  06/04/23 181 lb 12.8 oz (82.5 kg)  05/01/23 183 lb 9.6 oz (83.3 kg)    GEN: Well nourished, well developed in no acute distress NECK: No JVD; No carotid bruits CARDIAC: Irregular RR, no murmurs, rubs, gallops RESPIRATORY:  Clear to auscultation without rales, wheezing or rhonchi  ABDOMEN: Soft, non-tender, non-distended EXTREMITIES:  No edema; No deformity   Assessment & Plan Atrial fibrillation on chronic  anticoagulation Atrial fibrillation is confirmed on EKG with HR of 46 bpm. She is asymptomatic and has not felt any symptoms since being told she had A-fib.  We discussed increased stroke risk associated with A-fib.  She has some minor bruising but no significant bleeding problems and agrees to continue Eliquis  5 mg twice daily which is appropriate dose for stroke prevention for CHA2DS2-VASc score of 5.  Echo from [facility] reveals no atrial enlargement. She would like to discuss treatment options for a fib as she does not want to remain on lifelong anticoagulation.   -Refer to the AFib clinic for management  -Continue Eliquis   -Continue diltiazem  and metoprolol  for rate control   CAD Hyperlipidemia LDL goal < 70 Remote history of MI in 2012.  She denies chest pain, dyspnea, or other symptoms concerning for angina.  No indication for further ischemic evaluation at this time.  Lipid panel completed 08/14/2023 with total cholesterol 131, HDL 57, triglycerides 99, and LDL-C 56.  Lipids are well-controlled - We will stop aspirin  given that she is on Eliquis  for A-fib - Continue atorvastatin , metoprolol , lisinopril   Lower extremity edema Echo in Care Everywhere from 07/04/23 with normal LVEF 55-60%, mild LVH, normal RV, no diastolic dysfunction reported. Advised that lower extremity edema may be secondary to venous insufficiency. She had venous ultrasound 08/01/2023 with no evidence of blood clot.  - Advised appropriate leg elevation  - Continue compression stockings during the day  - Continue regular activity and avoid times of prolonged sitting with legs dependent  - We will provide Lasix  20 mg to use as needed for leg swelling  - Report significant improvement with Lasix  given that she is on daily hydrochlorothiazide  for potential medication adjustment - Limit sodium intake to less than 2000 mg per day.  Chronic obstructive pulmonary disease (COPD)   COPD was exacerbated by flu in May, requiring  hospitalization. She is currently stable with oxygen saturation at 95-98%. She has not been using supplemental oxygen.  -Follow-up with PCP and pulmonologist advised         Dispo: Refer to A Fib clinic/6 months with Dr. Loni  Signed, Rosaline Bane, NP-C

## 2023-10-04 ENCOUNTER — Encounter (HOSPITAL_BASED_OUTPATIENT_CLINIC_OR_DEPARTMENT_OTHER): Payer: Self-pay

## 2023-10-04 ENCOUNTER — Encounter (HOSPITAL_BASED_OUTPATIENT_CLINIC_OR_DEPARTMENT_OTHER): Payer: Self-pay | Admitting: Nurse Practitioner

## 2023-10-04 ENCOUNTER — Ambulatory Visit: Payer: Self-pay | Admitting: Nurse Practitioner

## 2023-10-04 LAB — BASIC METABOLIC PANEL WITH GFR
BUN/Creatinine Ratio: 24 (ref 12–28)
BUN: 29 mg/dL — ABNORMAL HIGH (ref 8–27)
CO2: 23 mmol/L (ref 20–29)
Calcium: 9.4 mg/dL (ref 8.7–10.3)
Chloride: 104 mmol/L (ref 96–106)
Creatinine, Ser: 1.23 mg/dL — ABNORMAL HIGH (ref 0.57–1.00)
Glucose: 103 mg/dL — ABNORMAL HIGH (ref 70–99)
Potassium: 4.2 mmol/L (ref 3.5–5.2)
Sodium: 144 mmol/L (ref 134–144)
eGFR: 44 mL/min/1.73 — ABNORMAL LOW (ref >59)

## 2023-10-09 ENCOUNTER — Other Ambulatory Visit (HOSPITAL_COMMUNITY): Payer: Self-pay

## 2023-10-14 ENCOUNTER — Other Ambulatory Visit (HOSPITAL_COMMUNITY): Payer: Self-pay

## 2023-10-14 ENCOUNTER — Other Ambulatory Visit (HOSPITAL_BASED_OUTPATIENT_CLINIC_OR_DEPARTMENT_OTHER): Payer: Self-pay

## 2023-10-14 MED ORDER — METOPROLOL SUCCINATE ER 100 MG PO TB24
100.0000 mg | ORAL_TABLET | Freq: Every day | ORAL | 1 refills | Status: AC
  Filled 2023-10-14 – 2024-01-08 (×3): qty 90, 90d supply, fill #0

## 2023-10-16 ENCOUNTER — Other Ambulatory Visit (HOSPITAL_COMMUNITY): Payer: Self-pay

## 2023-10-23 ENCOUNTER — Ambulatory Visit: Admitting: Nurse Practitioner

## 2023-10-29 ENCOUNTER — Encounter (HOSPITAL_COMMUNITY): Payer: Self-pay | Admitting: Internal Medicine

## 2023-10-29 ENCOUNTER — Ambulatory Visit (HOSPITAL_COMMUNITY)
Admission: RE | Admit: 2023-10-29 | Discharge: 2023-10-29 | Disposition: A | Discharge status: Home / Self Care | Source: Ambulatory Visit | Attending: Internal Medicine | Admitting: Internal Medicine

## 2023-10-29 ENCOUNTER — Other Ambulatory Visit (HOSPITAL_COMMUNITY): Payer: Self-pay

## 2023-10-29 VITALS — BP 114/60 | HR 75 | Ht 68.0 in | Wt 178.6 lb

## 2023-10-29 DIAGNOSIS — D6869 Other thrombophilia: Secondary | ICD-10-CM

## 2023-10-29 DIAGNOSIS — I4819 Other persistent atrial fibrillation: Secondary | ICD-10-CM

## 2023-10-29 NOTE — Progress Notes (Signed)
 Primary Care Physician: Dwight Trula SQUIBB, MD Primary Cardiologist: Soyla DELENA Merck, MD Electrophysiologist: None     Referring Physician: Rosaline Bane, NP     Rachel Chase is a 83 y.o. female with a history of HTN, CAD, edema, COPD, and atrial fibrillation who presents for consultation in the [facility]. Patient seen by Cardiology on 8/7 and notes hospital admission in May for COPD exacerbation found to be in Afib. Been in asymptomatic Afib since then and confirmed to be in Afib on 8/7. Patient is on Eliquis  5 mg BID for a CHADS2VASC score of 5.  On evaluation today, patient is currently in Afib. She does not have cardiac awareness. She is tolerating Eliquis  without issue.    Today, she denies symptoms of palpitations, chest pain, shortness of breath, orthopnea, PND, lower extremity edema, dizziness, presyncope, syncope, snoring, daytime somnolence, bleeding, or neurologic sequela. The patient is tolerating medications without difficulties and is otherwise without complaint today.    she has a BMI of Body mass index is 27.16 kg/m.SABRA Filed Weights   10/29/23 1015  Weight: 81 kg    Current Outpatient Medications  Medication Sig Dispense Refill   albuterol  (VENTOLIN  HFA) 108 (90 Base) MCG/ACT inhaler Inhale 1 puff into the lungs every 6 (six) hours as needed. 6.7 g 1   apixaban  (ELIQUIS ) 5 MG TABS tablet Take 1 tablet (5 mg total) by mouth 2 (two) times daily. 60 tablet 3   atorvastatin  (LIPITOR) 40 MG tablet Take 1 tablet (40 mg total) by mouth daily. 90 tablet 3   budesonide  (PULMICORT ) 0.5 MG/2ML nebulizer solution Take 2 mLs (0.5 mg total) by nebulization 2 (two) times daily as needed. 60 mL 1   budesonide -glycopyrrolate -formoterol  (BREZTRI  AEROSPHERE) 160-9-4.8 MCG/ACT AERO inhaler Inhale 2 puffs into the lungs in the morning and at bedtime. 10.7 g 11   diltiazem  (CARDIZEM  CD) 180 MG 24 hr capsule Take 1 capsule (180 mg total) by mouth daily. 30  capsule 2   furosemide  (LASIX ) 20 MG tablet Take 1 tablet (20 mg total) by mouth as needed (leg swelling). 10 tablet 3   hydrochlorothiazide  (HYDRODIURIL ) 25 MG tablet Take 1 tablet (25 mg total) by mouth in the morning. 90 tablet 4   HYDROcodone -acetaminophen  (NORCO/VICODIN) 5-325 MG tablet Take 1-1.5 tablets by mouth daily. *Must last 30 days* 40 tablet 0   lisinopril  (ZESTRIL ) 40 MG tablet Take 1 tablet (40 mg total) by mouth daily. 90 tablet 3   LORazepam  (ATIVAN ) 1 MG tablet Take 1 tablet (1 mg total) by mouth at bedtime. 30 tablet 5   LORazepam  (ATIVAN ) 1 MG tablet Take 1 tablet (1 mg total) by mouth at bedtime as needed. 30 tablet 5   metoprolol  succinate (TOPROL -XL) 100 MG 24 hr tablet Take 1 tablet (100 mg total) by mouth daily. 90 tablet 1   Multiple Vitamins-Minerals (PRESERVISION AREDS 2 PO) Take 1 tablet by mouth in the morning and at bedtime.     nitroGLYCERIN  (NITROSTAT ) 0.4 MG SL tablet DISSOLVE ONE TABLET UNDER THE TONGUE AS NEEDED FOR CHEST PAIN 25 tablet 0   Spacer/Aero-Holding Chambers (AEROCHAMBER MV) inhaler Use as instructed 1 each 0   triamcinolone  cream (KENALOG ) 0.1 % Apply topically 2 (two) times daily. 80 g 2   triamterene -hydrochlorothiazide  (MAXZIDE -25) 37.5-25 MG tablet Take 1 tablet by mouth in the morning. 90 tablet 3   Vitamin D , Ergocalciferol , (DRISDOL ) 1.25 MG (50000 UNIT) CAPS capsule Take 1 capsule (50,000 Units total) by mouth  every 14 (fourteen) days. 6 capsule 3   No current facility-administered medications for this encounter.    Atrial Fibrillation Management history:  Previous antiarrhythmic drugs: none Previous cardioversions: none Previous ablations: none Anticoagulation history: Eliquis    ROS- All systems are reviewed and negative except as per the HPI above.  Physical Exam: BP 114/60   Pulse 75   Ht 5' 8 (1.727 m)   Wt 81 kg   LMP 02/27/2008 Comment: tubal ligation  BMI 27.16 kg/m   GEN: Well nourished, well developed in no acute  distress NECK: No JVD; No carotid bruits CARDIAC: Irregularly irregular rate and rhythm, no murmurs, rubs, gallops RESPIRATORY:  Clear to auscultation without rales, wheezing or rhonchi  ABDOMEN: Soft, non-tender, non-distended EXTREMITIES:  No edema; No deformity   EKG today demonstrates  Vent. rate 75 BPM PR interval * ms QRS duration 80 ms QT/QTcB 392/437 ms P-R-T axes * 84 14 Atrial fibrillation Low voltage QRS Septal infarct (cited on or before 03-Oct-2023) Abnormal ECG When compared with ECG of 03-Oct-2023 14:52, Vent. rate has increased BY 29 BPM   Echo 07/04/23 (outside study) demonstrated  PROCEDURE  Image Quality  Technically adequate.  The left ventricular size is normal.  Mild left ventricular hypertrophy  Left ventricular systolic function is normal.  LV ejection fraction = 55-60%.  There is mild mitral regurgitation.  There is no pericardial effusion.  IVC size was normal.  The ascending aorta is normal size.  There is no comparison study available.   ASSESSMENT & PLAN CHA2DS2-VASc Score = 5  The patient's score is based upon: CHF History: 0 HTN History: 1 Diabetes History: 0 Stroke History: 0 Vascular Disease History: 1 Age Score: 2 Gender Score: 1       ASSESSMENT AND PLAN: Persistent Atrial Fibrillation (ICD10:  I48.19) The patient's CHA2DS2-VASc score is 5, indicating a 7.2% annual risk of stroke.    Patient is currently in Afib. We discussed rate control strategy and also cardioversion procedure. We discussed trying to restore normal rhythm versus staying in rate controlled Afib. After discussion, patient states she would like to continue in rate control strategy and declines cardioversion.   Secondary Hypercoagulable State (ICD10:  D68.69) The patient is at significant risk for stroke/thromboembolism based upon her CHA2DS2-VASc Score of 5.  Continue Apixaban  (Eliquis ).  Continue Eliquis  5 mg BID. Dosage correct based on weight > 60 kg and  creatinine below 1.5.    Follow up Afib clinic prn.   Terra Pac, [facility]  Afib Clinic [address] [place], [place] [address] [phone]

## 2023-11-12 ENCOUNTER — Other Ambulatory Visit: Payer: Self-pay

## 2023-11-12 ENCOUNTER — Other Ambulatory Visit (HOSPITAL_COMMUNITY): Payer: Self-pay

## 2023-11-18 ENCOUNTER — Ambulatory Visit (HOSPITAL_BASED_OUTPATIENT_CLINIC_OR_DEPARTMENT_OTHER): Admitting: Pulmonary Disease

## 2023-11-18 ENCOUNTER — Encounter (HOSPITAL_BASED_OUTPATIENT_CLINIC_OR_DEPARTMENT_OTHER): Payer: Self-pay | Admitting: Pulmonary Disease

## 2023-11-18 VITALS — BP 118/64 | HR 72 | Ht 68.0 in | Wt 179.1 lb

## 2023-11-18 DIAGNOSIS — J9611 Chronic respiratory failure with hypoxia: Secondary | ICD-10-CM | POA: Diagnosis not present

## 2023-11-18 DIAGNOSIS — Z7901 Long term (current) use of anticoagulants: Secondary | ICD-10-CM

## 2023-11-18 DIAGNOSIS — J4489 Other specified chronic obstructive pulmonary disease: Secondary | ICD-10-CM | POA: Diagnosis not present

## 2023-11-18 DIAGNOSIS — J439 Emphysema, unspecified: Secondary | ICD-10-CM

## 2023-11-18 DIAGNOSIS — I4891 Unspecified atrial fibrillation: Secondary | ICD-10-CM | POA: Diagnosis not present

## 2023-11-18 NOTE — Patient Instructions (Signed)
  VISIT SUMMARY: Today, you came in for your annual follow-up. We reviewed your history of COPD and atrial fibrillation, including your recent hospitalization in May 2025 due to a COPD exacerbation triggered by a parainfluenza infection. We also discussed your current medications and overall health maintenance.  YOUR PLAN: -CHRONIC OBSTRUCTIVE PULMONARY DISEASE (COPD): COPD is a chronic lung disease that makes it hard to breathe. You had a COPD exacerbation in May 2025, which required hospitalization. Your lung function is borderline, and we discussed the importance of using oxygen during exacerbations. We will order an overnight oximetry test to check for low oxygen levels while you sleep. Please keep your oxygen equipment as a precaution for future exacerbations, especially during winter. Continue using Breztri  for your COPD management, and make sure to get your flu shot and COVID-19 booster for additional protection.  -ATRIAL FIBRILLATION: Atrial fibrillation is an irregular and often rapid heart rate that can increase your risk of strokes and other heart-related complications. You were diagnosed with this condition during your hospitalization in May 2025. Your condition is well-managed with Eliquis  for preventing blood clots and metoprolol  for controlling your heart rate. Continue taking these medications as prescribed.  INSTRUCTIONS: We will order an overnight oximetry test to assess for low oxygen levels while you sleep. Please keep your oxygen equipment available as a precaution for future exacerbations. Continue using Breztri  for COPD management, and make sure to get your flu shot and COVID-19 booster for additional protection. Continue taking Eliquis  and metoprolol  as prescribed for your atrial fibrillation.                      Contains text generated by Abridge.                                 Contains text generated by Abridge.

## 2023-11-18 NOTE — Progress Notes (Signed)
 Subjective:    Patient ID: Rachel Chase, female    DOB: October 12, 1940, 83 y.o.   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  History of Present Illness Rachel Chase is an 83 year old female with COPD and atrial fibrillation who presents for an annual follow-up.  In May 2025, she experienced a COPD exacerbation triggered by a parainfluenza infection, leading to significant dyspnea and an eight-day hospitalization. Post-discharge, she was prescribed oxygen therapy but discontinued it after one week. Her current COPD management includes Breztri , which she receives at no cost.  She was diagnosed with new onset atrial fibrillation during the hospitalization and is currently on Eliquis  and metoprolol , with the latter reduced from 200 mg to 100 mg. She attended an AFib class for further education.  She experienced right leg swelling, which was evaluated for a blood clot and treated with Lasix , resulting in improvement.  She has received flu, pneumonia, and RSV vaccinations and is considering another COVID-19 vaccine. She has not contracted COVID-19 despite exposure.     Reviewed hosp record  Significant tests/ events reviewed   CT chest without contrast 06/2017 stable left lower lobe lobulated nodule 17 x 16 mm   PFTs 05/2021, moderate to severe obstruction, ratio 55, FEV1 43%, no bronchodilator response, DLCO 12.3/56%, TLC 115%   PFTs 06/2016 severe obstruction, ratio 52, FEV1 0.89/35%, FVC 1.70/51% , FEV1 improved to 45%/1.15 with bronchodilator ,  TLC 106%, DLCO 9.2/30%   Review of Systems  neg for any significant sore throat, dysphagia, itching, sneezing, nasal congestion or excess/ purulent secretions, fever, chills, sweats, unintended wt loss, pleuritic or exertional cp, hempoptysis, orthopnea pnd or change in chronic leg swelling. Also denies presyncope, palpitations, heartburn, abdominal pain, nausea, vomiting, diarrhea or change in bowel or urinary habits, dysuria,hematuria, rash, arthralgias, visual complaints, headache, numbness weakness or ataxia.      Objective:   Physical Exam  Gen. Pleasant, well-nourished, in no distress ENT - no thrush, no pallor/icterus,no post nasal drip Neck: No JVD, no thyromegaly, no carotid bruits Lungs: no use of accessory muscles, no dullness to percussion, clear without rales or rhonchi  Cardiovascular: Rhythm regular, heart sounds  normal, no murmurs or gallops, no peripheral edema Musculoskeletal: No deformities, no cyanosis or clubbing        Assessment & Plan:   Assessment and Plan Assessment & Plan Chronic obstructive pulmonary disease (COPD) with recent exacerbation Chr resp failure with hypoxia COPD exacerbation in May 2025 due to parainfluenza infection, requiring hospitalization. Lung function is borderline with FEV1 around 40-45%. Oxygen was prescribed post-hospitalization but not used. Discussed importance of oxygen during exacerbations due to lack of respiratory reserve. Consideration of oxygen as a precaution for future exacerbations, especially during winter. Potential need for oxygen during sleep if nocturnal hypoxia is present. - Order overnight oximetry to assess for nocturnal hypoxia. - Advise to keep oxygen equipment as a precaution for future exacerbations. - Continue Breztri  for COPD management. - Advise to receive the flu shot  and COVID-19 booster for additional protection.  Atrial fibrillation New onset atrial fibrillation diagnosed during hospitalization in May  2025. Well-managed on Eliquis  for anticoagulation and metoprolol  for rate control. No symptoms of palpitations or racing heart. - Continue Eliquis  for anticoagulation. - Continue metoprolol  for rate control.

## 2023-11-18 NOTE — Addendum Note (Signed)
 Addended by: TRUDY WARREN CROME on: 11/18/2023 04:07 PM   Modules accepted: Orders

## 2023-11-21 ENCOUNTER — Other Ambulatory Visit: Payer: Self-pay

## 2023-11-21 ENCOUNTER — Other Ambulatory Visit (HOSPITAL_COMMUNITY): Payer: Self-pay

## 2023-11-21 MED ORDER — ELIQUIS 5 MG PO TABS
5.0000 mg | ORAL_TABLET | Freq: Two times a day (BID) | ORAL | 3 refills | Status: AC
  Filled 2023-11-21 – 2023-11-22 (×2): qty 60, 30d supply, fill #0
  Filled 2023-12-23: qty 60, 30d supply, fill #1
  Filled 2024-01-23: qty 60, 30d supply, fill #2
  Filled 2024-03-24: qty 60, 30d supply, fill #3

## 2023-11-22 ENCOUNTER — Other Ambulatory Visit (HOSPITAL_COMMUNITY): Payer: Self-pay

## 2023-11-28 ENCOUNTER — Other Ambulatory Visit (HOSPITAL_COMMUNITY): Payer: Self-pay

## 2023-11-28 MED ORDER — HYDROCODONE-ACETAMINOPHEN 5-325 MG PO TABS
1.0000 | ORAL_TABLET | Freq: Every day | ORAL | 0 refills | Status: DC
  Filled 2023-11-28: qty 40, 30d supply, fill #0
  Filled 2023-11-30: qty 40, 26d supply, fill #0

## 2023-11-30 ENCOUNTER — Other Ambulatory Visit (HOSPITAL_COMMUNITY): Payer: Self-pay

## 2023-11-30 ENCOUNTER — Other Ambulatory Visit (HOSPITAL_BASED_OUTPATIENT_CLINIC_OR_DEPARTMENT_OTHER): Payer: Self-pay

## 2023-12-02 ENCOUNTER — Other Ambulatory Visit (HOSPITAL_COMMUNITY): Payer: Self-pay

## 2023-12-04 DIAGNOSIS — R0902 Hypoxemia: Secondary | ICD-10-CM | POA: Diagnosis not present

## 2023-12-04 DIAGNOSIS — G4730 Sleep apnea, unspecified: Secondary | ICD-10-CM | POA: Diagnosis not present

## 2023-12-06 ENCOUNTER — Encounter: Payer: Self-pay | Admitting: Pulmonary Disease

## 2023-12-11 ENCOUNTER — Other Ambulatory Visit: Payer: Self-pay

## 2023-12-11 ENCOUNTER — Telehealth: Payer: Self-pay | Admitting: Internal Medicine

## 2023-12-11 NOTE — Telephone Encounter (Signed)
   Pre-operative Risk Assessment    Patient Name: Rachel Chase  DOB: May 03, 1940 MRN: [id number]   Date of last office visit: 10/03/23 Date of next office visit: n/a  Request for Surgical Clearance    Procedure:  Dental Extraction - Amount of Teeth to be Pulled:  2 (12 and 13) and placement of implant   Date of Surgery:  Clearance TBD                                Surgeon:  Dr. Remi Socks Group or Practice Name:  [facility]  Phone number:  [phone] Fax number:  [phone]   Type of Clearance Requested:   - Medical  - Pharmacy:  Hold Apixaban  (Eliquis )     Type of Anesthesia:  Local    Additional requests/questions:     SignedBarbee DELENA Sharps   12/11/2023, 12:28 PM

## 2023-12-11 NOTE — Telephone Encounter (Signed)
 Pharmacy please advise on holding Eliquis  prior to dental extraction (2 teeth) and placement of dental implant scheduled for TBD. (This appeared more complicated than plain extractions so I reached out to you for recommendations). Last labs (BMET) 10/03/2023; (CBC) 07/04/2023. Thank you.

## 2023-12-12 ENCOUNTER — Telehealth (HOSPITAL_BASED_OUTPATIENT_CLINIC_OR_DEPARTMENT_OTHER): Payer: Self-pay | Admitting: *Deleted

## 2023-12-12 ENCOUNTER — Telehealth: Payer: Self-pay | Admitting: Internal Medicine

## 2023-12-12 ENCOUNTER — Other Ambulatory Visit (HOSPITAL_COMMUNITY): Payer: Self-pay

## 2023-12-12 NOTE — Telephone Encounter (Signed)
 Also will the pt need an appt?

## 2023-12-12 NOTE — Telephone Encounter (Signed)
 SABRA

## 2023-12-12 NOTE — Telephone Encounter (Signed)
 Me    12/12/23  4:26 PM Note I will reach back out to our preop APP and Pharm-d for preop clearance for dental procedure.       12/12/23  3:25 PM Shepard Noble routed this conversation to Cv Div Preop Callback (Selected Message)  Shepard Noble DC   12/12/23  3:25 PM Note Dental office is calling to f/u on clearance Please Advise         12/12/23  3:21 PM Dr [facility] contacted Shepard Noble

## 2023-12-12 NOTE — Telephone Encounter (Signed)
 I will reach back out to our preop APP and Pharm-d for preop clearance for dental procedure.

## 2023-12-12 NOTE — Telephone Encounter (Signed)
 Dental office is calling to f/u on clearance Please Advise

## 2023-12-13 NOTE — Telephone Encounter (Signed)
 Will need VV after pharmacy makes recommendations.

## 2023-12-14 ENCOUNTER — Other Ambulatory Visit (HOSPITAL_COMMUNITY): Payer: Self-pay

## 2023-12-18 ENCOUNTER — Telehealth: Payer: Self-pay

## 2023-12-18 NOTE — Telephone Encounter (Signed)
  Patient Consent for Virtual Visit        Rachel Chase has provided verbal consent on 12/18/2023 for a virtual visit (video or telephone).   CONSENT FOR VIRTUAL VISIT FOR:  Rachel Chase  By participating in this virtual visit I agree to the following:  I hereby voluntarily request, consent and authorize [facility] and its employed or contracted physicians, physician assistants, nurse practitioners or other licensed health care professionals (the Practitioner), to provide me with telemedicine health care services (the "Services) as deemed necessary by the treating Practitioner. I acknowledge and consent to receive the Services by the Practitioner via telemedicine. I understand that the telemedicine visit will involve communicating with the Practitioner through live audiovisual communication technology and the disclosure of certain medical information by electronic transmission. I acknowledge that I have been given the opportunity to request an in-person assessment or other available alternative prior to the telemedicine visit and am voluntarily participating in the telemedicine visit.  I understand that I have the right to withhold or withdraw my consent to the use of telemedicine in the course of my care at any time, without affecting my right to future care or treatment, and that the Practitioner or I may terminate the telemedicine visit at any time. I understand that I have the right to inspect all information obtained and/or recorded in the course of the telemedicine visit and may receive copies of available information for a reasonable fee.  I understand that some of the potential risks of receiving the Services via telemedicine include:  Delay or interruption in medical evaluation due to technological equipment failure or disruption; Information transmitted may not be sufficient (e.g. poor resolution of images) to allow for appropriate medical decision making by the  Practitioner; and/or  In rare instances, security protocols could fail, causing a breach of personal health information.  Furthermore, I acknowledge that it is my responsibility to provide information about my medical history, conditions and care that is complete and accurate to the best of my ability. I acknowledge that Practitioner's advice, recommendations, and/or decision may be based on factors not within their control, such as incomplete or inaccurate data provided by me or distortions of diagnostic images or specimens that may result from electronic transmissions. I understand that the practice of medicine is not an exact science and that Practitioner makes no warranties or guarantees regarding treatment outcomes. I acknowledge that a copy of this consent can be made available to me via my patient portal [facility] MyChart), or I can request a printed copy by calling the office of [facility].    I understand that my insurance will be billed for this visit.   I have read or had this consent read to me. I understand the contents of this consent, which adequately explains the benefits and risks of the Services being provided via telemedicine.  I have been provided ample opportunity to ask questions regarding this consent and the Services and have had my questions answered to my satisfaction. I give my informed consent for the services to be provided through the use of telemedicine in my medical care

## 2023-12-18 NOTE — Telephone Encounter (Signed)
 Preop tele appt now scheduled, med rec and consent done.

## 2023-12-19 NOTE — Telephone Encounter (Signed)
 Patient with diagnosis of afib on Eliquis  for anticoagulation.    Procedure:   Dental Extraction - Amount of Teeth to be Pulled:  2 (12 and 13) and placement of implant  Date of procedure: TBD   CHA2DS2-VASc Score = 5   This indicates a 7.2% annual risk of stroke. The patient's score is based upon: CHF History: 0 HTN History: 1 Diabetes History: 0 Stroke History: 0 Vascular Disease History: 1 Age Score: 2 Gender Score: 1      CrCl 39 ml/min Platelet count 261  Patient has not had an Afib/aflutter ablation in the last 3 months, DCCV within the last 4 weeks or a watchman implanted in the last 45 days   Per office protocol, patient can hold Eliquis  for 1 day prior to procedure.    **This guidance is not considered finalized until pre-operative APP has relayed final recommendations.**

## 2023-12-23 ENCOUNTER — Other Ambulatory Visit (HOSPITAL_COMMUNITY): Payer: Self-pay

## 2023-12-24 ENCOUNTER — Other Ambulatory Visit: Payer: Self-pay

## 2023-12-24 ENCOUNTER — Other Ambulatory Visit (HOSPITAL_COMMUNITY): Payer: Self-pay

## 2023-12-24 MED ORDER — DILTIAZEM HCL ER COATED BEADS 180 MG PO CP24
180.0000 mg | ORAL_CAPSULE | Freq: Every day | ORAL | 2 refills | Status: DC
  Filled 2023-12-24: qty 30, 30d supply, fill #0
  Filled 2024-01-23: qty 30, 30d supply, fill #1
  Filled 2024-02-22: qty 30, 30d supply, fill #2

## 2023-12-30 ENCOUNTER — Ambulatory Visit: Attending: Cardiovascular Disease | Admitting: Cardiology

## 2023-12-30 DIAGNOSIS — Z01810 Encounter for preprocedural cardiovascular examination: Secondary | ICD-10-CM

## 2023-12-30 DIAGNOSIS — Z01818 Encounter for other preprocedural examination: Secondary | ICD-10-CM

## 2023-12-30 NOTE — Progress Notes (Signed)
 Virtual Visit via Telephone Note   Because of Rachel Chase co-morbid illnesses, she is at least at moderate risk for complications without adequate follow up.  This format is felt to be most appropriate for this patient at this time.  Due to technical limitations with video connection (technology), today's appointment will be conducted as an audio only telehealth visit, and Rachel Chase verbally agreed to proceed in this manner.   All issues noted in this document were discussed and addressed.  No physical exam could be performed with this format.  Evaluation Performed:  Preoperative cardiovascular risk assessment _____________   Date:  12/30/2023   Patient ID:  Rachel Chase, DOB 1940-11-04, MRN [id number] Patient Location:  Home Provider location:   Office  Primary Care Provider:  Dwight Trula SQUIBB, MD Primary Cardiologist:  Soyla DELENA Merck, MD  Chief Complaint / Patient Profile   83 y.o. y/o female with a h/o CAD with CTO of RCA s/p cardiac arrest in 2012 >> Lexiscan  2018 no ischemia, atrial fibrillation on Eliquis , COPD, dyslipidemia, hypertension, history of breast cancer, prior tobacco abuse, left lung nodule, who is pending dental extraction of 2 teeth and placement of implant and presents today for telephonic preoperative cardiovascular risk assessment.  History of Present Illness    Rachel Chase is a 83 y.o. female who presents via audio/video conferencing for a telehealth visit today.  Pt was last seen in cardiology clinic on 10/29/2023 at the atrial fibrillation clinic by Fairy Heinrich, PA.  At that time TALAJAH SLIMP was doing well, she remained in atrial fibrillation but was unaware and asymptomatic, tolerating her Eliquis  without any issues with the ultimate decision to proceed with rate control strategies.  The patient is now pending procedure as outlined above. Since her last visit, she has been doing well, lives at [facility] in the independent living side,  stays active and play bridge. Occasional pedal edema but this is at baseline and she has PRN lasix  for this. Some DOE but is at baseline for her COPD.  She denies chest pain, palpitations, pnd, orthopnea, n, v, dizziness, syncope, weight gain, or early satiety.   Past Medical History    Past Medical History:  Diagnosis Date   Anxiety    Arthritis    Back pain    Lower Back pain - takes Cortisone shots   Bone spur 2009   lower back   Breast cancer (HCC) 2015   finished radiation 12/15   Bronchitis    CAD (coronary artery disease)    a. cath 12/2010 for cardiac arrest showed    Cataract    Complication of anesthesia    throat irrition from past intubations, intubation granulomas   Coronary artery disease    Dyspnea on exertion    History of cardiac arrest 12/2010   Hyperlipemia    Hypertension    MI (myocardial infarction) (HCC) 12/2010   Was followed by Dr. 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 (followed by Army Loving, MD)   Radiation 01/06/14-02/04/14   left breast    Rosacea 2008   both eyes   Past Surgical History:  Procedure Laterality Date   ANTERIOR LAT LUMBAR FUSION N/A 11/18/2018   Procedure: Lumbar One-Two Anterolateral lumbar decompression/fusion/lateral plate fixation;  Surgeon: Colon Shove, MD;  Location: [facility];  Service: Neurosurgery;  Laterality: N/A;  Lumbar One-Two Anterolateral  lumbar decompression/fusion/lateral plate fixation   BACK SURGERY     BILIARY DILATION  05/29/2019   Procedure: BILIARY DILATION;  Surgeon: Rosalie Kitchens, MD;  Location: [facility];  Service: Endoscopy;;   BREAST BIOPSY  8/10   fibrocystic changes   BREAST LUMPECTOMY Left 2015   BREAST LUMPECTOMY WITH NEEDLE LOCALIZATION AND AXILLARY SENTINEL LYMPH NODE BX Left 11/13/2013   Procedure: NEEDLE LOCALIZATION LEFT BREAST LUMPECTOMY WITH LEFT  AXILLARY SENTINEL LYMPH NODE BX;  Surgeon:  Morene Olives, MD;  Location: [facility];  Service: General;  Laterality: Left;   CATARACT EXTRACTION     both   CHOLECYSTECTOMY N/A 05/30/2019   Procedure: LAPAROSCOPIC CHOLECYSTECTOMY;  Surgeon: Vanderbilt Ned, MD;  Location: [facility];  Service: General;  Laterality: N/A;   CHOLECYSTECTOMY     dental implant     ERCP N/A 05/29/2019   Procedure: ENDOSCOPIC RETROGRADE CHOLANGIOPANCREATOGRAPHY (ERCP);  Surgeon: Rosalie Kitchens, MD;  Location: [facility];  Service: Endoscopy;  Laterality: N/A;   EYE SURGERY     Laser Eye Surgery approx 2007   heart stent     failed-no stents   HYSTEROSCOPY  9/10   D&C (polyp)   LEFT HEART CATHETERIZATION WITH CORONARY ANGIOGRAM N/A 01/18/2011   Procedure: LEFT HEART CATHETERIZATION WITH CORONARY ANGIOGRAM;  Surgeon: Rober LOISE Chroman, MD;  Location: [facility];  Service: Cardiovascular;  Laterality: N/A;   PERCUTANEOUS CORONARY STENT INTERVENTION (PCI-S) N/A 01/18/2011   Procedure: PERCUTANEOUS CORONARY STENT INTERVENTION (PCI-S);  Surgeon: Rober LOISE Chroman, MD;  Location[facility];  Service: Cardiovascular;  Laterality: N/A;   REMOVAL OF STONES  05/29/2019   Procedure: REMOVAL OF STONES;  Surgeon: Rosalie Kitchens, MD;  Location: [facility];  Service: Endoscopy;;   SPHINCTEROTOMY  05/29/2019   Procedure: ANNETT;  Surgeon: Rosalie Kitchens, MD;  Location: [facility];  Service: Endoscopy;;   SPINAL FUSION     TONSILLECTOMY AND ADENOIDECTOMY     TUBAL LIGATION      Allergies  Allergies  Allergen Reactions   Dilaudid  [Hydromorphone  Hcl] Nausea And Vomiting   Penicillins Rash    Home Medications    Prior to Admission medications   Medication Sig Start Date End Date Taking? Authorizing Provider  albuterol  (VENTOLIN  HFA) 108 (90 Base) MCG/ACT inhaler Inhale 1 puff into the lungs every 6 (six) hours as needed. 05/01/23   Hope Almarie ORN, NP  apixaban  (ELIQUIS ) 5 MG TABS tablet Take 1 tablet (5 mg total) by mouth 2 (two) times daily.  11/21/23     atorvastatin  (LIPITOR) 40 MG tablet Take 1 tablet (40 mg total) by mouth daily. 08/22/23     budesonide  (PULMICORT ) 0.5 MG/2ML nebulizer solution Take 2 mLs (0.5 mg total) by nebulization 2 (two) times daily as needed. 05/01/23   Hope Almarie ORN, NP  budesonide -glycopyrrolate -formoterol  (BREZTRI  AEROSPHERE) 160-9-4.8 MCG/ACT AERO inhaler Inhale 2 puffs into the lungs in the morning and at bedtime. 08/02/23   Hope Almarie ORN, NP  diltiazem  (CARDIZEM  CD) 180 MG 24 hr capsule Take 1 capsule (180 mg total) by mouth daily. 12/24/23     furosemide  (LASIX ) 20 MG tablet Take 1 tablet (20 mg total) by mouth as needed (leg swelling). 10/03/23 01/01/24  Swinyer, Rosaline HERO, NP  hydrochlorothiazide  (HYDRODIURIL ) 25 MG tablet Take 1 tablet (25 mg total) by mouth in the morning. 08/13/22     HYDROcodone -acetaminophen  (NORCO/VICODIN) 5-325 MG tablet Take 1-1.5 tablets by mouth daily. Must last 30 days. 11/28/23     lisinopril  (ZESTRIL ) 40 MG  tablet Take 1 tablet (40 mg total) by mouth daily. 08/22/23     LORazepam  (ATIVAN ) 1 MG tablet Take 1 tablet (1 mg total) by mouth at bedtime as needed. 07/14/23     metoprolol  succinate (TOPROL -XL) 100 MG 24 hr tablet Take 1 tablet (100 mg total) by mouth daily. 10/14/23   Swinyer, Rosaline HERO, NP  Multiple Vitamins-Minerals (PRESERVISION AREDS 2 PO) Take 1 tablet by mouth in the morning and at bedtime.    [provider]  nitroGLYCERIN  (NITROSTAT ) 0.4 MG SL tablet DISSOLVE ONE TABLET UNDER THE TONGUE AS NEEDED FOR CHEST PAIN 04/08/23   Loni Soyla LABOR, MD  Spacer/Aero-Holding Chambers (AEROCHAMBER MV) inhaler Use as instructed 05/31/21   Jude Harden GAILS, MD  triamcinolone  cream (KENALOG ) 0.1 % Apply topically 2 (two) times daily. 06/18/22     triamterene -hydrochlorothiazide  (MAXZIDE -25) 37.5-25 MG tablet Take 1 tablet by mouth in the morning. 09/30/23     Vitamin D , Ergocalciferol , (DRISDOL ) 1.25 MG (50000 UNIT) CAPS capsule Take 1 capsule (50,000 Units total) by  mouth every 14 (fourteen) days. 12/18/22   Cleotilde Ronal RAMAN, MD    Physical Exam    Vital Signs:  Jerone LELON Rm does not have vital signs available for review today.  Given telephonic nature of communication, physical exam is limited. AAOx3. NAD. Normal affect.  Speech and respirations are unlabored.  Accessory Clinical Findings    None  Assessment & Plan    1.  Preoperative Cardiovascular Risk Assessment: According to the Revised Cardiac Risk Index (RCRI), her Perioperative Risk of Major Cardiac Event is (%): 0.9 Her Functional Capacity in METs is: 4.64 according to the [place] Activity Status Index (DASI).  The patient was advised that if she develops new symptoms prior to surgery to contact our office to arrange for a follow-up visit, and she verbalized understanding. Therefore, based on ACC/AHA guidelines, patient would be at acceptable risk for the planned procedure without further cardiovascular testing. I will route this recommendation to the requesting party via Epic fax function.   Per office protocol, patient can hold Eliquis  for 1 day prior to procedure.    A copy of this note will be routed to requesting surgeon.  Time:   Today, I have spent 10 minutes with the patient with telehealth technology discussing medical history, symptoms, and management plan.    Delon Hoover DNP, AGNP-BC  12/30/2023, 10:45 AM [address]  [place], [place] [address] Office [phone] Fax [phone]

## 2024-01-08 ENCOUNTER — Other Ambulatory Visit (HOSPITAL_COMMUNITY): Payer: Self-pay

## 2024-01-09 ENCOUNTER — Other Ambulatory Visit (HOSPITAL_COMMUNITY): Payer: Self-pay

## 2024-01-09 ENCOUNTER — Other Ambulatory Visit: Payer: Self-pay

## 2024-01-09 MED ORDER — LORAZEPAM 1 MG PO TABS
1.0000 mg | ORAL_TABLET | Freq: Every evening | ORAL | 2 refills | Status: AC | PRN
  Filled 2024-01-09: qty 30, 30d supply, fill #0
  Filled 2024-02-09 – 2024-02-10 (×2): qty 30, 30d supply, fill #1
  Filled 2024-03-10: qty 30, 30d supply, fill #2

## 2024-01-24 ENCOUNTER — Other Ambulatory Visit (HOSPITAL_COMMUNITY): Payer: Self-pay

## 2024-02-10 ENCOUNTER — Other Ambulatory Visit (HOSPITAL_COMMUNITY): Payer: Self-pay

## 2024-02-10 ENCOUNTER — Other Ambulatory Visit: Payer: Self-pay

## 2024-02-22 ENCOUNTER — Other Ambulatory Visit (HOSPITAL_COMMUNITY): Payer: Self-pay

## 2024-02-23 ENCOUNTER — Other Ambulatory Visit (HOSPITAL_COMMUNITY): Payer: Self-pay

## 2024-02-23 MED ORDER — HYDROCODONE-ACETAMINOPHEN 5-325 MG PO TABS
1.0000 | ORAL_TABLET | Freq: Every day | ORAL | 0 refills | Status: AC
  Filled 2024-02-23: qty 40, 26d supply, fill #0

## 2024-03-10 ENCOUNTER — Other Ambulatory Visit: Payer: Self-pay

## 2024-03-10 ENCOUNTER — Other Ambulatory Visit (HOSPITAL_COMMUNITY): Payer: Self-pay

## 2024-03-24 ENCOUNTER — Other Ambulatory Visit (HOSPITAL_COMMUNITY): Payer: Self-pay

## 2024-03-24 MED ORDER — DILTIAZEM HCL ER COATED BEADS 180 MG PO CP24
180.0000 mg | ORAL_CAPSULE | Freq: Every day | ORAL | 2 refills | Status: AC
  Filled 2024-03-24: qty 30, 30d supply, fill #0

## 2024-03-25 ENCOUNTER — Other Ambulatory Visit (HOSPITAL_COMMUNITY): Payer: Self-pay

## 2024-03-25 ENCOUNTER — Other Ambulatory Visit: Payer: Self-pay
